# Patient Record
Sex: Female | Born: 1950
Health system: Southern US, Community
[De-identification: ages and names within clinical notes are randomized; demographics above are authoritative.]

## PROBLEM LIST (undated history)

## (undated) DIAGNOSIS — J189 Pneumonia, unspecified organism: Secondary | ICD-10-CM

## (undated) DIAGNOSIS — D649 Anemia, unspecified: Secondary | ICD-10-CM

## (undated) DIAGNOSIS — F17210 Nicotine dependence, cigarettes, uncomplicated: Secondary | ICD-10-CM

## (undated) DIAGNOSIS — R7303 Prediabetes: Secondary | ICD-10-CM

## (undated) DIAGNOSIS — E559 Vitamin D deficiency, unspecified: Secondary | ICD-10-CM

## (undated) DIAGNOSIS — F329 Major depressive disorder, single episode, unspecified: Secondary | ICD-10-CM

## (undated) DIAGNOSIS — Z923 Personal history of irradiation: Secondary | ICD-10-CM

## (undated) DIAGNOSIS — J984 Other disorders of lung: Secondary | ICD-10-CM

## (undated) DIAGNOSIS — C50919 Malignant neoplasm of unspecified site of unspecified female breast: Secondary | ICD-10-CM

## (undated) DIAGNOSIS — M797 Fibromyalgia: Secondary | ICD-10-CM

## (undated) DIAGNOSIS — F419 Anxiety disorder, unspecified: Secondary | ICD-10-CM

## (undated) DIAGNOSIS — I10 Essential (primary) hypertension: Secondary | ICD-10-CM

## (undated) DIAGNOSIS — K219 Gastro-esophageal reflux disease without esophagitis: Secondary | ICD-10-CM

## (undated) DIAGNOSIS — M7989 Other specified soft tissue disorders: Secondary | ICD-10-CM

## (undated) DIAGNOSIS — R0602 Shortness of breath: Secondary | ICD-10-CM

## (undated) DIAGNOSIS — F17211 Nicotine dependence, cigarettes, in remission: Secondary | ICD-10-CM

## (undated) DIAGNOSIS — N1831 Chronic kidney disease, stage 3a: Secondary | ICD-10-CM

## (undated) DIAGNOSIS — F319 Bipolar disorder, unspecified: Secondary | ICD-10-CM

## (undated) DIAGNOSIS — M549 Dorsalgia, unspecified: Secondary | ICD-10-CM

## (undated) DIAGNOSIS — J301 Allergic rhinitis due to pollen: Secondary | ICD-10-CM

## (undated) DIAGNOSIS — F1421 Cocaine dependence, in remission: Secondary | ICD-10-CM

## (undated) DIAGNOSIS — Z8619 Personal history of other infectious and parasitic diseases: Secondary | ICD-10-CM

## (undated) DIAGNOSIS — F32A Depression, unspecified: Secondary | ICD-10-CM

## (undated) DIAGNOSIS — J8410 Pulmonary fibrosis, unspecified: Secondary | ICD-10-CM

## (undated) DIAGNOSIS — J449 Chronic obstructive pulmonary disease, unspecified: Secondary | ICD-10-CM

## (undated) DIAGNOSIS — I129 Hypertensive chronic kidney disease with stage 1 through stage 4 chronic kidney disease, or unspecified chronic kidney disease: Secondary | ICD-10-CM

## (undated) DIAGNOSIS — F4310 Post-traumatic stress disorder, unspecified: Secondary | ICD-10-CM

## (undated) DIAGNOSIS — I700 Atherosclerosis of aorta: Secondary | ICD-10-CM

## (undated) DIAGNOSIS — N1832 Chronic kidney disease, stage 3b: Secondary | ICD-10-CM

## (undated) DIAGNOSIS — G4730 Sleep apnea, unspecified: Secondary | ICD-10-CM

## (undated) DIAGNOSIS — B009 Herpesviral infection, unspecified: Secondary | ICD-10-CM

## (undated) DIAGNOSIS — R42 Dizziness and giddiness: Secondary | ICD-10-CM

## (undated) DIAGNOSIS — M1990 Unspecified osteoarthritis, unspecified site: Secondary | ICD-10-CM

## (undated) HISTORY — DX: Vitamin D deficiency, unspecified: E55.9

## (undated) HISTORY — DX: Fibromyalgia: M79.7

## (undated) HISTORY — DX: Atherosclerosis of aorta: I70.0

## (undated) HISTORY — DX: Chronic kidney disease, stage 3a: N18.31

## (undated) HISTORY — DX: Hypertensive chronic kidney disease with stage 1 through stage 4 chronic kidney disease, or unspecified chronic kidney disease: I12.9

## (undated) HISTORY — DX: Shortness of breath: R06.02

## (undated) HISTORY — DX: Dorsalgia, unspecified: M54.9

## (undated) HISTORY — DX: Other specified soft tissue disorders: M79.89

## (undated) HISTORY — DX: Unspecified osteoarthritis, unspecified site: M19.90

## (undated) HISTORY — DX: Chronic kidney disease, stage 3b: N18.32

## (undated) HISTORY — DX: Herpesviral infection, unspecified: B00.9

## (undated) HISTORY — DX: Allergic rhinitis due to pollen: J30.1

## (undated) HISTORY — DX: Dizziness and giddiness: R42

## (undated) HISTORY — DX: Pulmonary fibrosis, unspecified: J84.10

## (undated) HISTORY — DX: Nicotine dependence, cigarettes, in remission: F17.211

## (undated) HISTORY — DX: Cocaine dependence, in remission: F14.21

## (undated) HISTORY — DX: Bipolar disorder, unspecified: F31.9

## (undated) HISTORY — DX: Nicotine dependence, cigarettes, uncomplicated: F17.210

## (undated) HISTORY — DX: Personal history of other infectious and parasitic diseases: Z86.19

## (undated) HISTORY — DX: Other disorders of lung: J98.4

---
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
## 1999-06-26 DIAGNOSIS — D649 Anemia, unspecified: Secondary | ICD-10-CM

## 1999-06-26 HISTORY — PX: GASTRIC BYPASS: SHX52

## 1999-06-26 HISTORY — DX: Anemia, unspecified: D64.9

## 2011-06-26 DIAGNOSIS — C50919 Malignant neoplasm of unspecified site of unspecified female breast: Secondary | ICD-10-CM

## 2011-06-26 DIAGNOSIS — Z923 Personal history of irradiation: Secondary | ICD-10-CM

## 2011-06-26 HISTORY — PX: BREAST LUMPECTOMY: SHX2

## 2011-06-26 HISTORY — DX: Malignant neoplasm of unspecified site of unspecified female breast: C50.919

## 2011-06-26 HISTORY — DX: Personal history of irradiation: Z92.3

## 2013-04-07 DIAGNOSIS — F329 Major depressive disorder, single episode, unspecified: Secondary | ICD-10-CM | POA: Insufficient documentation

## 2013-04-07 DIAGNOSIS — D0510 Intraductal carcinoma in situ of unspecified breast: Secondary | ICD-10-CM | POA: Insufficient documentation

## 2013-04-07 DIAGNOSIS — F32A Depression, unspecified: Secondary | ICD-10-CM | POA: Insufficient documentation

## 2013-10-24 DIAGNOSIS — N390 Urinary tract infection, site not specified: Secondary | ICD-10-CM | POA: Insufficient documentation

## 2013-10-24 DIAGNOSIS — Z720 Tobacco use: Secondary | ICD-10-CM | POA: Insufficient documentation

## 2013-10-24 DIAGNOSIS — T783XXA Angioneurotic edema, initial encounter: Secondary | ICD-10-CM | POA: Insufficient documentation

## 2015-04-20 DIAGNOSIS — R768 Other specified abnormal immunological findings in serum: Secondary | ICD-10-CM | POA: Insufficient documentation

## 2015-11-02 DIAGNOSIS — K219 Gastro-esophageal reflux disease without esophagitis: Secondary | ICD-10-CM | POA: Insufficient documentation

## 2015-11-28 ENCOUNTER — Encounter (HOSPITAL_COMMUNITY): Payer: Self-pay | Admitting: Emergency Medicine

## 2015-11-28 ENCOUNTER — Emergency Department (HOSPITAL_COMMUNITY)
Admission: EM | Admit: 2015-11-28 | Discharge: 2015-11-29 | Disposition: A | Discharge status: Home / Self Care | Payer: Medicare Other | Attending: Emergency Medicine | Admitting: Emergency Medicine

## 2015-11-28 DIAGNOSIS — F17200 Nicotine dependence, unspecified, uncomplicated: Secondary | ICD-10-CM | POA: Insufficient documentation

## 2015-11-28 DIAGNOSIS — F4310 Post-traumatic stress disorder, unspecified: Secondary | ICD-10-CM | POA: Insufficient documentation

## 2015-11-28 DIAGNOSIS — I10 Essential (primary) hypertension: Secondary | ICD-10-CM | POA: Insufficient documentation

## 2015-11-28 DIAGNOSIS — Z0283 Encounter for blood-alcohol and blood-drug test: Secondary | ICD-10-CM

## 2015-11-28 DIAGNOSIS — Z853 Personal history of malignant neoplasm of breast: Secondary | ICD-10-CM | POA: Diagnosis not present

## 2015-11-28 DIAGNOSIS — F329 Major depressive disorder, single episode, unspecified: Secondary | ICD-10-CM | POA: Insufficient documentation

## 2015-11-28 HISTORY — DX: Malignant neoplasm of unspecified site of unspecified female breast: C50.919

## 2015-11-28 HISTORY — DX: Post-traumatic stress disorder, unspecified: F43.10

## 2015-11-28 HISTORY — DX: Major depressive disorder, single episode, unspecified: F32.9

## 2015-11-28 HISTORY — DX: Depression, unspecified: F32.A

## 2015-11-28 HISTORY — DX: Essential (primary) hypertension: I10

## 2015-11-28 HISTORY — DX: Anxiety disorder, unspecified: F41.9

## 2015-11-28 LAB — RAPID URINE DRUG SCREEN, HOSP PERFORMED
Amphetamines: NOT DETECTED
Barbiturates: NOT DETECTED
Benzodiazepines: NOT DETECTED
COCAINE: NOT DETECTED
OPIATES: NOT DETECTED
Tetrahydrocannabinol: NOT DETECTED

## 2015-11-28 NOTE — ED Provider Notes (Signed)
CSN: [id number]     Arrival date & time 11/28/15  2254 History   First MD Initiated Contact with Patient 11/28/15 2336     Chief Complaint  Patient presents with  . Drug / Alcohol Assessment     (Consider location/radiation/quality/duration/timing/severity/associated sxs/prior Treatment) HPI Patient presents from a group facility with concern of possible drug exposure. Patient notes that she has not used opiates in 51 days. However, the patient had a positive drug screen, was evicted from her facility. She denies any recent use, or any physical complaints, any change from baseline medical condition. He requests additional evaluation.    Past Medical History  Diagnosis Date  . Hypertension   . Anxiety   . Depression   . PTSD (post-traumatic stress disorder)   . Breast cancer [facility])    Past Surgical History  Procedure Laterality Date  . Gastric bypass  2001   No family history on file. Social History  Substance Use Topics  . Smoking status: Current Every Day Smoker  . Smokeless tobacco: Not on file  . Alcohol Use: Yes   OB History    No data available     Review of Systems  Constitutional: Negative for fever.  Respiratory: Negative for shortness of breath.   Cardiovascular: Negative for chest pain.  Musculoskeletal:       Negative aside from HPI  Skin:       Negative aside from HPI  Allergic/Immunologic: Negative for immunocompromised state.  Neurological: Negative for weakness.  Psychiatric/Behavioral: Negative.       Allergies  Lisinopril  Home Medications   Prior to Admission medications   Not on File   BP 128/67 mmHg  Pulse 70  Temp(Src) 98.3 F (36.8 C) (Oral)  Resp 18  SpO2 97% Physical Exam  Constitutional: She is oriented to person, place, and time. She appears well-developed and well-nourished. No distress.  HENT:  Head: Normocephalic and atraumatic.  Eyes: Conjunctivae are normal. Right eye exhibits no discharge. Left eye exhibits no  discharge.  Pulmonary/Chest: No respiratory distress.  Musculoskeletal:  No appreciable abnormalities  Neurological: She is alert and oriented to person, place, and time. No cranial nerve deficit. She exhibits normal muscle tone. Coordination normal.  Skin: Skin is warm and dry.  Psychiatric: She has a normal mood and affect.  Nursing note and vitals reviewed.   ED Course  Procedures (including critical care time) Labs Review Labs Reviewed  URINE RAPID DRUG SCREEN, HOSP PERFORMED     Update: I discussed the patient's drug testing results with her residential facility staff.  MDM   Final diagnoses:  Blood drug testing for medicolegal reasons  Patient presents with concern of positive drug screen at a residential facility. Here repeat testing does not have positive values. Patient discharged in stable condition.  Carmin Muskrat, MD 11/29/15 0003

## 2015-11-28 NOTE — ED Notes (Signed)
Pt states that she tested false negative tonight on her drug test for the [facility] for morphine/opiates. She thinks it was a false negative because of the effexor she takes. Wants repeat drug test. Alert and oriented.

## 2015-11-29 NOTE — ED Notes (Signed)
Patient was alert, oriented and stable upon discharge. RN went over AVS and patient had no further questions.  

## 2015-11-29 NOTE — Discharge Instructions (Signed)
As discussed, your evaluation today has been largely reassuring.  But, it is important that you monitor your condition carefully, and do not hesitate to return to the ED if you develop new, or concerning changes in your condition. ? ?Otherwise, please follow-up with your physician for appropriate ongoing care. ? ?

## 2016-03-27 ENCOUNTER — Other Ambulatory Visit: Payer: Self-pay | Admitting: Family Medicine

## 2016-03-27 DIAGNOSIS — Z853 Personal history of malignant neoplasm of breast: Secondary | ICD-10-CM

## 2016-04-02 DIAGNOSIS — N950 Postmenopausal bleeding: Secondary | ICD-10-CM | POA: Insufficient documentation

## 2016-04-02 DIAGNOSIS — Z853 Personal history of malignant neoplasm of breast: Secondary | ICD-10-CM | POA: Insufficient documentation

## 2016-04-08 ENCOUNTER — Ambulatory Visit (HOSPITAL_COMMUNITY)
Admission: EM | Admit: 2016-04-08 | Discharge: 2016-04-08 | Disposition: A | Discharge status: Home / Self Care | Payer: Medicare Other | Attending: Internal Medicine | Admitting: Internal Medicine

## 2016-04-08 ENCOUNTER — Encounter (HOSPITAL_COMMUNITY): Payer: Self-pay | Admitting: *Deleted

## 2016-04-08 DIAGNOSIS — T3111 Burns involving 10-19% of body surface with 10-19% third degree burns: Secondary | ICD-10-CM

## 2016-04-08 MED ORDER — SILVER SULFADIAZINE 1 % EX CREA
TOPICAL_CREAM | CUTANEOUS | Status: AC
Start: 2016-04-08 — End: 2016-04-08
  Filled 2016-04-08: qty 85

## 2016-04-08 MED ORDER — SULFAMETHOXAZOLE-TRIMETHOPRIM 800-160 MG PO TABS: 1.0000 | ORAL_TABLET | Freq: Two times a day (BID) | ORAL | 0 refills | Status: DC

## 2016-04-08 MED ORDER — SILVER SULFADIAZINE 1 % EX CREA: 1.0000 "application " | TOPICAL_CREAM | Freq: Every day | CUTANEOUS | 0 refills | Status: DC

## 2016-04-08 MED ORDER — SILVER SULFADIAZINE 1 % EX CREA
TOPICAL_CREAM | Freq: Two times a day (BID) | CUTANEOUS | Status: DC
  Administered 2016-04-08: 13:00:00 via TOPICAL

## 2016-04-08 NOTE — ED Notes (Signed)
Wound  Care     With  silvedene   dsd  And  Kling/  Ace  Wrap    Applied  To  [facility]

## 2016-04-08 NOTE — Discharge Instructions (Signed)
Apply silverdene cream twice a day after wash and dry area. You have an open wound on RT lower abdomen area that needs to be wrapped loose after application. Do not use tape to cover. You do have a low grade fever monitor this and if tempeture rises you will need to return. The blisters on the RT thigh may burst or drain do not burst these on your own let them heal.
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
## 2016-04-08 NOTE — ED Notes (Signed)
Wound   Cleaned  With  Anti septic   Cream
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
## 2016-04-12 ENCOUNTER — Ambulatory Visit (HOSPITAL_COMMUNITY)
Admission: EM | Admit: 2016-04-12 | Discharge: 2016-04-12 | Disposition: A | Discharge status: Home / Self Care | Payer: Medicare Other | Attending: Family Medicine | Admitting: Family Medicine

## 2016-04-12 ENCOUNTER — Encounter (HOSPITAL_COMMUNITY): Payer: Self-pay | Admitting: Emergency Medicine

## 2016-04-12 DIAGNOSIS — Z760 Encounter for issue of repeat prescription: Secondary | ICD-10-CM

## 2016-04-12 DIAGNOSIS — Z5189 Encounter for other specified aftercare: Secondary | ICD-10-CM

## 2016-04-12 DIAGNOSIS — T2122XD Burn of second degree of abdominal wall, subsequent encounter: Secondary | ICD-10-CM

## 2016-04-12 MED ORDER — SILVER SULFADIAZINE 1 % EX CREA: 1.0000 "application " | TOPICAL_CREAM | Freq: Every day | CUTANEOUS | 0 refills | Status: DC

## 2016-04-12 MED ORDER — SILVER SULFADIAZINE 1 % EX CREA
TOPICAL_CREAM | Freq: Once | CUTANEOUS | Status: AC
  Administered 2016-04-12: 16:00:00 via TOPICAL
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
## 2016-04-12 NOTE — Discharge Instructions (Signed)
Clean the wound once a day in the shower. Pat dry. Apply the Silvadene cream for the next 4-5 days. By this time the wound should be drying and scabbing over. Watch for any signs of infection, increased redness, purulent or white drainage, redness around the wound, bad odor or other signs of infection sig medical attention promptly.
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
## 2016-05-10 DIAGNOSIS — Z90710 Acquired absence of both cervix and uterus: Secondary | ICD-10-CM | POA: Insufficient documentation

## 2016-05-30 DIAGNOSIS — M545 Low back pain: Secondary | ICD-10-CM | POA: Diagnosis not present

## 2016-05-30 DIAGNOSIS — L929 Granulomatous disorder of the skin and subcutaneous tissue, unspecified: Secondary | ICD-10-CM | POA: Diagnosis not present

## 2016-05-30 DIAGNOSIS — R300 Dysuria: Secondary | ICD-10-CM | POA: Diagnosis not present

## 2016-06-14 ENCOUNTER — Ambulatory Visit (INDEPENDENT_AMBULATORY_CARE_PROVIDER_SITE_OTHER): Payer: Commercial Managed Care - HMO

## 2016-06-14 ENCOUNTER — Encounter (INDEPENDENT_AMBULATORY_CARE_PROVIDER_SITE_OTHER): Payer: Self-pay | Admitting: Orthopaedic Surgery

## 2016-06-14 ENCOUNTER — Ambulatory Visit (INDEPENDENT_AMBULATORY_CARE_PROVIDER_SITE_OTHER): Payer: Commercial Managed Care - HMO | Admitting: Orthopaedic Surgery

## 2016-06-14 DIAGNOSIS — M1711 Unilateral primary osteoarthritis, right knee: Secondary | ICD-10-CM

## 2016-06-14 DIAGNOSIS — M1712 Unilateral primary osteoarthritis, left knee: Secondary | ICD-10-CM | POA: Insufficient documentation

## 2016-06-14 NOTE — Progress Notes (Signed)
Office Visit Note   Patient: Rachel Woodard           Date of Birth: Feb 10, 1951           MRN: [id number] Visit Date: 06/14/2016              Requested by: Chester Holstein, MD [address] [place], [place] [id number] PCP: Chester Holstein, MD   Assessment & Plan: Visit Diagnoses:  1. Primary osteoarthritis of right knee   2. Unilateral primary osteoarthritis, left knee     Plan: discussed treatment options including surgical vs nonsurgical options.  Patient is ready for TKA to improve quality of life.  I gave her info on TKA and discussed surgery details including r/b/a.  Denies h/o DVT.  We will schedule her for the near future.  Follow-Up Instructions: Return for 2 week postop visit.   Orders:  Orders Placed This Encounter  Procedures  . XR KNEE 3 VIEW RIGHT  . XR KNEE 3 VIEW LEFT   No orders of the defined types were placed in this encounter.     Procedures: No procedures performed   Clinical Data: No additional findings.   Subjective: Chief Complaint  Patient presents with  . Right Knee - Pain  . Left Knee - Pain  . Right Wrist - New Patient (Initial Visit)    65 yo female comes in today with bilateral knee pain worse on the right for many years.  Pain is 7/10 and endorses giving away.  Pain has severely affected quality of life and activity level.  Has had injections in the past but patient wants a more permanent treatment.  She does have fibromyalgia.  Pain is worse with standing and walking.      Review of Systems  Constitutional: Negative.   HENT: Negative.   Eyes: Negative.   Respiratory: Negative.   Cardiovascular: Negative.   Endocrine: Negative.   Musculoskeletal: Negative.   Neurological: Negative.   Hematological: Negative.   Psychiatric/Behavioral: Negative.   All other systems reviewed and are negative.    Objective: Vital Signs: There were no vitals taken for this visit.  Physical Exam  Constitutional: She  is oriented to person, place, and time. She appears well-developed and well-nourished.  Pulmonary/Chest: Effort normal.  Musculoskeletal:       Right knee: She exhibits no effusion.       Left knee: She exhibits no effusion.  Neurological: She is alert and oriented to person, place, and time.  Skin: Skin is warm. Capillary refill takes less than 2 seconds.  Psychiatric: She has a normal mood and affect. Her behavior is normal. Judgment and thought content normal.  Nursing note and vitals reviewed.   Right Knee Exam   Range of Motion  Extension: normal  Flexion: normal   Tests  [place]ray:  Medial - negative Lateral - negative  Other  Sensation: normal Pulse: present Swelling: none Other tests: no effusion present   Left Knee Exam   Range of Motion  Extension: normal  Flexion: normal   Tests  [place]ray:  Medial - negative   Other  Sensation: normal Pulse: present Swelling: none Effusion: no effusion present      Specialty Comments:  No specialty comments available.  Imaging: Xr Knee 3 View Left  Result Date: 06/14/2016 [facility]  Xr Knee 3 View Right  Result Date: 06/14/2016 [facility]    PMFS History: Patient Active Problem List   Diagnosis Date  Noted  . Unilateral primary osteoarthritis, left knee 06/14/2016   Past Medical History:  Diagnosis Date  . Anxiety   . Breast cancer ([place])   . Depression   . Hypertension   . PTSD (post-traumatic stress disorder)     No family history on file.  Past Surgical History:  Procedure Laterality Date  . GASTRIC BYPASS  2001   Social History   Occupational History  . Not on file.   Social History Main Topics  . Smoking status: Current Every Day Smoker  . Smokeless tobacco: Never Used  . Alcohol use Yes  . Drug use: No     Comment: clean x 50 days on 11/28/15  . Sexual activity: Not on file

## 2016-06-25 HISTORY — PX: ABDOMINAL HYSTERECTOMY: SHX81

## 2016-07-03 ENCOUNTER — Telehealth (INDEPENDENT_AMBULATORY_CARE_PROVIDER_SITE_OTHER): Payer: Self-pay | Admitting: *Deleted

## 2016-07-03 NOTE — Telephone Encounter (Signed)
[facility] [employer] calling for last office note on pt FAX: [phone]

## 2016-07-04 NOTE — Telephone Encounter (Signed)
Faxed note.

## 2016-07-10 ENCOUNTER — Ambulatory Visit: Payer: Medicare Other | Admitting: Family Medicine

## 2016-07-26 ENCOUNTER — Inpatient Hospital Stay (INDEPENDENT_AMBULATORY_CARE_PROVIDER_SITE_OTHER): Payer: Medicare Other | Admitting: Orthopaedic Surgery

## 2016-07-31 DIAGNOSIS — M76822 Posterior tibial tendinitis, left leg: Secondary | ICD-10-CM | POA: Diagnosis not present

## 2016-07-31 DIAGNOSIS — L602 Onychogryphosis: Secondary | ICD-10-CM | POA: Diagnosis not present

## 2016-07-31 DIAGNOSIS — B351 Tinea unguium: Secondary | ICD-10-CM | POA: Diagnosis not present

## 2016-07-31 DIAGNOSIS — M79671 Pain in right foot: Secondary | ICD-10-CM | POA: Diagnosis not present

## 2016-07-31 DIAGNOSIS — M79672 Pain in left foot: Secondary | ICD-10-CM | POA: Diagnosis not present

## 2016-07-31 DIAGNOSIS — B353 Tinea pedis: Secondary | ICD-10-CM | POA: Diagnosis not present

## 2016-08-08 ENCOUNTER — Ambulatory Visit: Payer: Medicare HMO | Attending: Family Medicine | Admitting: Internal Medicine

## 2016-08-08 VITALS — BP 131/84 | HR 80 | Temp 98.5°F | Resp 16 | Wt 240.6 lb

## 2016-08-08 DIAGNOSIS — Z13220 Encounter for screening for lipoid disorders: Secondary | ICD-10-CM | POA: Diagnosis not present

## 2016-08-08 DIAGNOSIS — Z1321 Encounter for screening for nutritional disorder: Secondary | ICD-10-CM

## 2016-08-08 DIAGNOSIS — Z888 Allergy status to other drugs, medicaments and biological substances status: Secondary | ICD-10-CM | POA: Diagnosis not present

## 2016-08-08 DIAGNOSIS — N951 Menopausal and female climacteric states: Secondary | ICD-10-CM

## 2016-08-08 DIAGNOSIS — Z79899 Other long term (current) drug therapy: Secondary | ICD-10-CM | POA: Diagnosis not present

## 2016-08-08 DIAGNOSIS — R0683 Snoring: Secondary | ICD-10-CM

## 2016-08-08 DIAGNOSIS — F3342 Major depressive disorder, recurrent, in full remission: Secondary | ICD-10-CM | POA: Diagnosis not present

## 2016-08-08 DIAGNOSIS — Z853 Personal history of malignant neoplasm of breast: Secondary | ICD-10-CM | POA: Insufficient documentation

## 2016-08-08 DIAGNOSIS — R7303 Prediabetes: Secondary | ICD-10-CM | POA: Insufficient documentation

## 2016-08-08 DIAGNOSIS — F17210 Nicotine dependence, cigarettes, uncomplicated: Secondary | ICD-10-CM | POA: Diagnosis not present

## 2016-08-08 DIAGNOSIS — M797 Fibromyalgia: Secondary | ICD-10-CM | POA: Diagnosis not present

## 2016-08-08 DIAGNOSIS — Z1329 Encounter for screening for other suspected endocrine disorder: Secondary | ICD-10-CM

## 2016-08-08 DIAGNOSIS — K219 Gastro-esophageal reflux disease without esophagitis: Secondary | ICD-10-CM | POA: Diagnosis not present

## 2016-08-08 DIAGNOSIS — F419 Anxiety disorder, unspecified: Secondary | ICD-10-CM

## 2016-08-08 DIAGNOSIS — Z90722 Acquired absence of ovaries, bilateral: Secondary | ICD-10-CM | POA: Insufficient documentation

## 2016-08-08 DIAGNOSIS — B351 Tinea unguium: Secondary | ICD-10-CM | POA: Diagnosis not present

## 2016-08-08 DIAGNOSIS — E559 Vitamin D deficiency, unspecified: Secondary | ICD-10-CM | POA: Diagnosis not present

## 2016-08-08 DIAGNOSIS — Z90710 Acquired absence of both cervix and uterus: Secondary | ICD-10-CM | POA: Insufficient documentation

## 2016-08-08 DIAGNOSIS — I10 Essential (primary) hypertension: Secondary | ICD-10-CM | POA: Diagnosis not present

## 2016-08-08 DIAGNOSIS — N898 Other specified noninflammatory disorders of vagina: Secondary | ICD-10-CM | POA: Insufficient documentation

## 2016-08-08 DIAGNOSIS — Z131 Encounter for screening for diabetes mellitus: Secondary | ICD-10-CM

## 2016-08-08 DIAGNOSIS — M170 Bilateral primary osteoarthritis of knee: Secondary | ICD-10-CM | POA: Diagnosis not present

## 2016-08-08 DIAGNOSIS — Z23 Encounter for immunization: Secondary | ICD-10-CM | POA: Diagnosis not present

## 2016-08-08 DIAGNOSIS — F4310 Post-traumatic stress disorder, unspecified: Secondary | ICD-10-CM | POA: Insufficient documentation

## 2016-08-08 LAB — LIPID PANEL
Cholesterol: 155 mg/dL (ref <200)
HDL: 51 mg/dL (ref >50)
LDL CALC: 87 mg/dL (ref <100)
Total CHOL/HDL Ratio: 3 Ratio (ref <5.0)
Triglycerides: 85 mg/dL (ref <150)
VLDL: 17 mg/dL (ref <30)

## 2016-08-08 LAB — CMP AND LIVER
ALT: 21 U/L (ref 6–29)
AST: 16 U/L (ref 10–35)
Albumin: 4 g/dL (ref 3.6–5.1)
Alkaline Phosphatase: 101 U/L (ref 33–130)
BILIRUBIN INDIRECT: 0.3 mg/dL (ref 0.2–1.2)
BUN: 14 mg/dL (ref 7–25)
Bilirubin, Direct: 0.1 mg/dL (ref <=0.2)
CHLORIDE: 107 mmol/L (ref 98–110)
CO2: 29 mmol/L (ref 20–31)
Calcium: 9.2 mg/dL (ref 8.6–10.4)
Creat: 0.95 mg/dL (ref 0.50–0.99)
GLUCOSE: 95 mg/dL (ref 65–99)
POTASSIUM: 4.3 mmol/L (ref 3.5–5.3)
SODIUM: 143 mmol/L (ref 135–146)
Total Bilirubin: 0.4 mg/dL (ref 0.2–1.2)
Total Protein: 7 g/dL (ref 6.1–8.1)

## 2016-08-08 LAB — TSH: TSH: 0.97 mIU/L

## 2016-08-08 LAB — POCT GLYCOSYLATED HEMOGLOBIN (HGB A1C): Hemoglobin A1C: 5.7

## 2016-08-08 MED ORDER — METFORMIN HCL 500 MG PO TABS: 500.0000 mg | ORAL_TABLET | Freq: Every day | ORAL | 3 refills | Status: DC

## 2016-08-08 MED ORDER — VENLAFAXINE HCL 75 MG PO TABS: 75.0000 mg | ORAL_TABLET | Freq: Every day | ORAL | 1 refills | Status: DC

## 2016-08-08 MED ORDER — RANITIDINE HCL 150 MG PO TABS: 150.0000 mg | ORAL_TABLET | Freq: Two times a day (BID) | ORAL | 3 refills | Status: DC

## 2016-08-08 MED ORDER — CARVEDILOL 3.125 MG PO TABS: 3.1250 mg | ORAL_TABLET | Freq: Two times a day (BID) | ORAL | 3 refills | Status: DC

## 2016-08-08 MED ORDER — AMLODIPINE BESYLATE 10 MG PO TABS: 10.0000 mg | ORAL_TABLET | Freq: Every day | ORAL | 3 refills | Status: DC

## 2016-08-08 MED ORDER — HYDROCHLOROTHIAZIDE 12.5 MG PO TABS: 12.5000 mg | ORAL_TABLET | Freq: Every day | ORAL | 3 refills | Status: DC

## 2016-08-08 MED ORDER — ESTRADIOL 0.1 MG/GM VA CREA: 1.0000 | TOPICAL_CREAM | Freq: Every day | VAGINAL | 12 refills | Status: DC

## 2016-08-08 NOTE — Patient Instructions (Addendum)
Calcium 1200mg / daily (over the counter)   Pneumococcal Polysaccharide Vaccine: What You Need to Know 1. Why get vaccinated? Vaccination can protect older adults (and some children and younger adults) from pneumococcal disease. Pneumococcal disease is caused by bacteria that can spread from person to person through close contact. It can cause ear infections, and it can also lead to more serious infections of the:  Lungs (pneumonia),  Blood (bacteremia), and  Covering of the brain and spinal cord (meningitis). Meningitis can cause deafness and brain damage, and it can be fatal. Anyone can get pneumococcal disease, but children under 36 years of age, people with certain medical conditions, adults over 90 years of age, and cigarette smokers are at the highest risk. About 18,000 older adults die each year from pneumococcal disease in the [place]. Treatment of pneumococcal infections with penicillin and other drugs used to be more effective. But some strains of the disease have become resistant to these drugs. This makes prevention of the disease, through vaccination, even more important. 2. Pneumococcal polysaccharide vaccine (PPSV23) Pneumococcal polysaccharide vaccine (PPSV23) protects against 23 types of pneumococcal bacteria. It will not prevent all pneumococcal disease. PPSV23 is recommended for:  All adults 37 years of age and older,  Anyone 2 through 64 years of age with certain long-term health problems,  Anyone 2 through 64 years of age with a weakened immune system,  Adults 3 through 64 years of age who smoke cigarettes or have asthma. Most people need only one dose of PPSV. A second dose is recommended for certain high-risk groups. People 27 and older should get a dose even if they have gotten one or more doses of the vaccine before they turned 65. Your healthcare provider can give you more information about these recommendations. Most healthy adults develop protection  within 2 to 3 weeks of getting the shot. 3. Some people should not get this vaccine  Anyone who has had a life-threatening allergic reaction to PPSV should not get another dose.  Anyone who has a severe allergy to any component of PPSV should not receive it. Tell your provider if you have any severe allergies.  Anyone who is moderately or severely ill when the shot is scheduled may be asked to wait until they recover before getting the vaccine. Someone with a mild illness can usually be vaccinated.  Children less than 93 years of age should not receive this vaccine.  There is no evidence that PPSV is harmful to either a pregnant woman or to her fetus. However, as a precaution, women who need the vaccine should be vaccinated before becoming pregnant, if possible. 4. Risks of a vaccine reaction With any medicine, including vaccines, there is a chance of side effects. These are usually mild and go away on their own, but serious reactions are also possible. About half of people who get PPSV have mild side effects, such as redness or pain where the shot is given, which go away within about two days. Less than 1 out of 100 people develop a fever, muscle aches, or more severe local reactions. Problems that could happen after any vaccine:  People sometimes faint after a medical procedure, including vaccination. Sitting or lying down for about 15 minutes can help prevent fainting, and injuries caused by a fall. Tell your doctor if you feel dizzy, or have vision changes or ringing in the ears.  Some people get severe pain in the shoulder and have difficulty moving the arm where a shot was given.  This happens very rarely.  Any medication can cause a severe allergic reaction. Such reactions from a vaccine are very rare, estimated at about 1 in a million doses, and would happen within a few minutes to a few hours after the vaccination. As with any medicine, there is a very remote chance of a vaccine causing  a serious injury or death. The safety of vaccines is always being monitored. For more information, visit: http://www.aguilar.org/ 5. What if there is a serious reaction? What should I look for? Look for anything that concerns you, such as signs of a severe allergic reaction, very high fever, or unusual behavior. Signs of a severe allergic reaction can include hives, swelling of the face and throat, difficulty breathing, a fast heartbeat, dizziness, and weakness. These would usually start a few minutes to a few hours after the vaccination. What should I do? If you think it is a severe allergic reaction or other emergency that can't wait, call 9-1-1 or get to the nearest hospital. Otherwise, call your doctor. Afterward, the reaction should be reported to the Vaccine Adverse Event Reporting System (VAERS). Your doctor might file this report, or you can do it yourself through the VAERS web site at www.vaers.SamedayNews.es, or by calling 5131477409. VAERS does not give medical advice. 6. How can I learn more?  Ask your doctor. He or she can give you the vaccine package insert or suggest other sources of information.  Call your local or state health department.  Contact the Centers for Disease Control and Prevention (CDC):  Call 2192988127 (1-800-CDC-INFO) or  Visit CDC's website at http://hunter.com/ CDC Pneumococcal Polysaccharide Vaccine VIS (10/16/13) This information is not intended to replace advice given to you by your health care provider. Make sure you discuss any questions you have with your health care provider. Document Released: 04/08/2006 Document Revised: 03/01/2016 Document Reviewed: 03/01/2016 Elsevier Interactive Patient Education  2017 [place]. Influenza Virus Vaccine injection (Fluarix) What is this medicine? INFLUENZA VIRUS VACCINE (in floo EN zuh VAHY ruhs vak SEEN) helps to reduce the risk of getting influenza also known as the flu. This medicine may be used  for other purposes; ask your health care provider or pharmacist if you have questions. COMMON BRAND NAME(S): Fluarix, Fluzone What should I tell my health care provider before I take this medicine? They need to know if you have any of these conditions: -bleeding disorder like hemophilia -fever or infection -Guillain-Barre syndrome or other neurological problems -immune system problems -infection with the human immunodeficiency virus (HIV) or AIDS -low blood platelet counts -multiple sclerosis -an unusual or allergic reaction to influenza virus vaccine, eggs, chicken proteins, latex, gentamicin, other medicines, foods, dyes or preservatives -pregnant or trying to get pregnant -breast-feeding How should I use this medicine? This vaccine is for injection into a muscle. It is given by a health care professional. A copy of Vaccine Information Statements will be given before each vaccination. Read this sheet carefully each time. The sheet may change frequently. Talk to your pediatrician regarding the use of this medicine in children. Special care may be needed. Overdosage: If you think you have taken too much of this medicine contact a poison control center or emergency room at once. NOTE: This medicine is only for you. Do not share this medicine with others. What if I miss a dose? This does not apply. What may interact with this medicine? -chemotherapy or radiation therapy -medicines that lower your immune system like etanercept, anakinra, infliximab, and adalimumab -medicines that treat  or prevent blood clots like warfarin -phenytoin -steroid medicines like prednisone or cortisone -theophylline -vaccines This list may not describe all possible interactions. Give your health care provider a list of all the medicines, herbs, non-prescription drugs, or dietary supplements you use. Also tell them if you smoke, drink alcohol, or use illegal drugs. Some items may interact with your  medicine. What should I watch for while using this medicine? Report any side effects that do not go away within 3 days to your doctor or health care professional. Call your health care provider if any unusual symptoms occur within 6 weeks of receiving this vaccine. You may still catch the flu, but the illness is not usually as bad. You cannot get the flu from the vaccine. The vaccine will not protect against colds or other illnesses that may cause fever. The vaccine is needed every year. What side effects may I notice from receiving this medicine? Side effects that you should report to your doctor or health care professional as soon as possible: -allergic reactions like skin rash, itching or hives, swelling of the face, lips, or tongue Side effects that usually do not require medical attention (report to your doctor or health care professional if they continue or are bothersome): -fever -headache -muscle aches and pains -pain, tenderness, redness, or swelling at site where injected -weak or tired This list may not describe all possible side effects. Call your doctor for medical advice about side effects. You may report side effects to FDA at 1-800-FDA-1088. Where should I keep my medicine? This vaccine is only given in a clinic, pharmacy, doctor's office, or other health care setting and will not be stored at home. NOTE: This sheet is a summary. It may not cover all possible information. If you have questions about this medicine, talk to your doctor, pharmacist, or health care provider.  2017 Elsevier/Gold Standard (2008-01-07 09:30:40) Td Vaccine (Tetanus and Diphtheria): What You Need to Know 1. Why get vaccinated? Tetanus  and diphtheria are very serious diseases. They are rare in the [place] today, but people who do become infected often have severe complications. Td vaccine is used to protect adolescents and adults from both of these diseases. Both tetanus and diphtheria are  infections caused by bacteria. Diphtheria spreads from person to person through coughing or sneezing. Tetanus-causing bacteria enter the body through cuts, scratches, or wounds. TETANUS (lockjaw) causes painful muscle tightening and stiffness, usually all over the body.  It can lead to tightening of muscles in the head and neck so you can't open your mouth, swallow, or sometimes even breathe. Tetanus kills about 1 out of every 10 people who are infected even after receiving the best medical care. DIPHTHERIA can cause a thick coating to form in the back of the throat.  It can lead to breathing problems, paralysis, heart failure, and death. Before vaccines, as many as 200,000 cases of diphtheria and hundreds of cases of tetanus were reported in the [place] each year. Since vaccination began, reports of cases for both diseases have dropped by about 99%. 2. Td vaccine Td vaccine can protect adolescents and adults from tetanus and diphtheria. Td is usually given as a booster dose every 10 years but it can also be given earlier after a severe and dirty wound or burn. Another vaccine, called Tdap, which protects against pertussis in addition to tetanus and diphtheria, is sometimes recommended instead of Td vaccine. Your doctor or the person giving you the vaccine can give you more  information. Td may safely be given at the same time as other vaccines. 3. Some people should not get this vaccine  A person who has ever had a life-threatening allergic reaction after a previous dose of any tetanus or diphtheria containing vaccine, OR has a severe allergy to any part of this vaccine, should not get Td vaccine. Tell the person giving the vaccine about any severe allergies.  Talk to your doctor if you:  had severe pain or swelling after any vaccine containing diphtheria or tetanus,  ever had a condition called Guillain Barre Syndrome (GBS),  aren't feeling well on the day the shot is scheduled. 4.  What are the risks from Td vaccine? With any medicine, including vaccines, there is a chance of side effects. These are usually mild and go away on their own. Serious reactions are also possible but are rare. Most people who get Td vaccine do not have any problems with it. Mild problems following Td vaccine: (Did not interfere with activities)  Pain where the shot was given (about 8 people in 10)  Redness or swelling where the shot was given (about 1 person in 4)  Mild fever (rare)  Headache (about 1 person in 4)  Tiredness (about 1 person in 4) Moderate problems following Td vaccine: (Interfered with activities, but did not require medical attention)  Fever over 102F (rare) Severe problems following Td vaccine: (Unable to perform usual activities; required medical attention)  Swelling, severe pain, bleeding and/or redness in the arm where the shot was given (rare). Problems that could happen after any vaccine:  People sometimes faint after a medical procedure, including vaccination. Sitting or lying down for about 15 minutes can help prevent fainting, and injuries caused by a fall. Tell your doctor if you feel dizzy, or have vision changes or ringing in the ears.  Some people get severe pain in the shoulder and have difficulty moving the arm where a shot was given. This happens very rarely.  Any medication can cause a severe allergic reaction. Such reactions from a vaccine are very rare, estimated at fewer than 1 in a million doses, and would happen within a few minutes to a few hours after the vaccination. As with any medicine, there is a very remote chance of a vaccine causing a serious injury or death. The safety of vaccines is always being monitored. For more information, visit: http://www.aguilar.org/ 5. What if there is a serious reaction? What should I look for? Look for anything that concerns you, such as signs of a severe allergic reaction, very high fever, or unusual  behavior. Signs of a severe allergic reaction can include hives, swelling of the face and throat, difficulty breathing, a fast heartbeat, dizziness, and weakness. These would usually start a few minutes to a few hours after the vaccination. What should I do?  If you think it is a severe allergic reaction or other emergency that can't wait, call 9-1-1 or get the person to the nearest hospital. Otherwise, call your doctor.  Afterward, the reaction should be reported to the Vaccine Adverse Event Reporting System (VAERS). Your doctor might file this report, or you can do it yourself through the VAERS web site at www.vaers.SamedayNews.es, or by calling (424)807-5978.  VAERS does not give medical advice. 6. The National Vaccine Injury Compensation Program The [employer] Vaccine Injury Compensation Program (VICP) is a federal program that was created to compensate people who may have been injured by certain vaccines. Persons who believe they may have been  injured by a vaccine can learn about the program and about filing a claim by calling 3658442677 or visiting the [employer] website at GoldCloset.com.ee. There is a time limit to file a claim for compensation. 7. How can I learn more?  Ask your doctor. He or she can give you the vaccine package insert or suggest other sources of information.  Call your local or state health department.  Contact the Centers for Disease Control and Prevention (CDC):  Call 5701560892 (1-800-CDC-INFO)  Visit CDC's website at http://hunter.com/ CDC Td Vaccine VIS (10/04/15) This information is not intended to replace advice given to you by your health care provider. Make sure you discuss any questions you have with your health care provider. Document Released: 04/08/2006 Document Revised: 03/01/2016 Document Reviewed: 03/01/2016 Elsevier Interactive Patient Education  2017 Elsevier Inc.  -  Low-Sodium Eating Plan Sodium raises blood pressure and causes  water to be held in the body. Getting less sodium from food will help lower your blood pressure, reduce any swelling, and protect your heart, liver, and kidneys. We get sodium by adding salt (sodium chloride) to food. Most of our sodium comes from canned, boxed, and frozen foods. Restaurant foods, fast foods, and pizza are also very high in sodium. Even if you take medicine to lower your blood pressure or to reduce fluid in your body, getting less sodium from your food is important. What is my plan? Most people should limit their sodium intake to 2,300 mg a day. Your health care provider recommends that you limit your sodium intake to 000mg  a day. What do I need to know about this eating plan? For the low-sodium eating plan, you will follow these general guidelines:  Choose foods with a % Daily Value for sodium of less than 5% (as listed on the food label).  Use salt-free seasonings or herbs instead of table salt or sea salt.  Check with your health care provider or pharmacist before using salt substitutes.  Eat fresh foods.  Eat more vegetables and fruits.  Limit canned vegetables. If you do use them, rinse them well to decrease the sodium.  Limit cheese to 1 oz (28 g) per day.  Eat lower-sodium products, often labeled as "lower sodium" or "no salt added."  Avoid foods that contain monosodium glutamate (MSG). MSG is sometimes added to [place] food and some canned foods.  Check food labels (Nutrition Facts labels) on foods to learn how much sodium is in one serving.  Eat more home-cooked food and less restaurant, buffet, and fast food.  When eating at a restaurant, ask that your food be prepared with less salt, or no salt if possible. How do I read food labels for sodium information? The Nutrition Facts label lists the amount of sodium in one serving of the food. If you eat more than one serving, you must multiply the listed amount of sodium by the number of servings. Food labels  may also identify foods as:  Sodium free-Less than 5 mg in a serving.  Very low sodium-35 mg or less in a serving.  Low sodium-140 mg or less in a serving.  Light in sodium-50% less sodium in a serving. For example, if a food that usually has 300 mg of sodium is changed to become light in sodium, it will have 150 mg of sodium.  Reduced sodium-25% less sodium in a serving. For example, if a food that usually has 400 mg of sodium is changed to reduced sodium, it will have 300 mg  of sodium. What foods can I eat? Grains  Low-sodium cereals, including oats, puffed wheat and rice, and shredded wheat cereals. Low-sodium crackers. Unsalted rice and pasta. Lower-sodium bread. Vegetables  Frozen or fresh vegetables. Low-sodium or reduced-sodium canned vegetables. Low-sodium or reduced-sodium tomato sauce and paste. Low-sodium or reduced-sodium tomato and vegetable juices. Fruits  Fresh, frozen, and canned fruit. Fruit juice. Meat and Other Protein Products  Low-sodium canned tuna and salmon. Fresh or frozen meat, poultry, seafood, and fish. Lamb. Unsalted nuts. Dried beans, peas, and lentils without added salt. Unsalted canned beans. Homemade soups without salt. Eggs. Dairy  Milk. Soy milk. Ricotta cheese. Low-sodium or reduced-sodium cheeses. Yogurt. Condiments  Fresh and dried herbs and spices. Salt-free seasonings. Onion and garlic powders. Low-sodium varieties of mustard and ketchup. Fresh or refrigerated horseradish. Lemon juice. Fats and Oils  Reduced-sodium salad dressings. Unsalted butter. Other  Unsalted popcorn and pretzels. The items listed above may not be a complete list of recommended foods or beverages. Contact your dietitian for more options.  What foods are not recommended? Grains  Instant hot cereals. Bread stuffing, pancake, and biscuit mixes. Croutons. Seasoned rice or pasta mixes. Noodle soup cups. Boxed or frozen macaroni and cheese. Self-rising flour. Regular salted  crackers. Vegetables  Regular canned vegetables. Regular canned tomato sauce and paste. Regular tomato and vegetable juices. Frozen vegetables in sauces. Salted [place] fries. Olives. Angie Fava. Relishes. Sauerkraut. Salsa. Meat and Other Protein Products  Salted, canned, smoked, spiced, or pickled meats, seafood, or fish. Bacon, ham, sausage, hot dogs, corned beef, chipped beef, and packaged luncheon meats. Salt pork. Jerky. Pickled herring. Anchovies, regular canned tuna, and sardines. Salted nuts. Dairy  Processed cheese and cheese spreads. Cheese curds. Blue cheese and cottage cheese. Buttermilk. Condiments  Onion and garlic salt, seasoned salt, table salt, and sea salt. Canned and packaged gravies. Worcestershire sauce. Tartar sauce. Barbecue sauce. Teriyaki sauce. Soy sauce, including reduced sodium. Steak sauce. Fish sauce. Oyster sauce. Cocktail sauce. Horseradish that you find on the shelf. Regular ketchup and mustard. Meat flavorings and tenderizers. Bouillon cubes. Hot sauce. Tabasco sauce. Marinades. Taco seasonings. Relishes. Fats and Oils  Regular salad dressings. Salted butter. Margarine. Ghee. Bacon fat. Other  Potato and tortilla chips. Corn chips and puffs. Salted popcorn and pretzels. Canned or dried soups. Pizza. Frozen entrees and pot pies. The items listed above may not be a complete list of foods and beverages to avoid. Contact your dietitian for more information.  This information is not intended to replace advice given to you by your health care provider. Make sure you discuss any questions you have with your health care provider. Document Released: 12/01/2001 Document Revised: 11/17/2015 Document Reviewed: 04/15/2013 Elsevier Interactive Patient Education  2017 [place].  //  Prediabetes Prediabetes is the condition of having a blood sugar (blood glucose) level that is higher than it should be, but not high enough for you to be diagnosed with type 2 diabetes. Having  prediabetes puts you at risk for developing type 2 diabetes (type 2 diabetes mellitus). Prediabetes may be called impaired glucose tolerance or impaired fasting glucose. Prediabetes usually does not cause symptoms. Your health care provider can diagnose this condition with blood tests. You may be tested for prediabetes if you are overweight and if you have at least one other risk factor for prediabetes. Risk factors for prediabetes include:  Having a family member with type 2 diabetes.  Being overweight or obese.  Being older than age 87.  Being of American-Indian, African-American, Hispanic/Latino,  or Asian/Pacific Islander descent.  Having an inactive (sedentary) lifestyle.  Having a history of gestational diabetes or polycystic ovarian syndrome (PCOS).  Having low levels of good cholesterol (HDL-C) or high levels of blood fats (triglycerides).  Having high blood pressure. What is blood glucose and how is blood glucose measured?   Blood glucose refers to the amount of glucose in your bloodstream. Glucose comes from eating foods that contain sugars and starches (carbohydrates) that the body breaks down into glucose. Your blood glucose level may be measured in mg/dL (milligrams per deciliter) or mmol/L (millimoles per liter).Your blood glucose may be checked with one or more of the following blood tests:  A fasting blood glucose (FBG) test. You will not be allowed to eat (you will fast) for at least 8 hours before a blood sample is taken.  A normal range for FBG is 70-100 mg/dl (3.9-5.6 mmol/L).  An A1c (hemoglobin A1c) blood test. This test provides information about blood glucose control over the previous 2?65months.  An oral glucose tolerance test (OGTT). This test measures your blood glucose twice:  After fasting. This is your baseline level.  Two hours after you drink a beverage that contains glucose. You may be diagnosed with prediabetes:  If your FBG is 100?125 mg/dL  (5.6-6.9 mmol/L).  If your A1c level is 5.7?6.4%.  If your OGGT result is 140?199 mg/dL (7.8-11 mmol/L). These blood tests may be repeated to confirm your diagnosis. What happens if blood glucose is too high? The pancreas produces a hormone (insulin) that helps move glucose from the bloodstream into cells. When cells in the body do not respond properly to insulin that the body makes (insulin resistance), excess glucose builds up in the blood instead of going into cells. As a result, high blood glucose (hyperglycemia) can develop, which can cause many complications. This is a symptom of prediabetes. What can happen if blood glucose stays higher than normal for a long time? Having high blood glucose for a long time is dangerous. Too much glucose in your blood can damage your nerves and blood vessels. Long-term damage can lead to complications from diabetes, which may include:  Heart disease.  Stroke.  Blindness.  Kidney disease.  Depression.  Poor circulation in the feet and legs, which could lead to surgical removal (amputation) in severe cases. How can prediabetes be prevented from turning into type 2 diabetes?   To help prevent type 2 diabetes, take the following actions:  Be physically active.  Do moderate-intensity physical activity for at least 30 minutes on at least 5 days of the week, or as much as told by your health care provider. This could be brisk walking, biking, or water aerobics.  Ask your health care provider what activities are safe for you. A mix of physical activities may be best, such as walking, swimming, cycling, and strength training.  Lose weight as told by your health care provider.  Losing 5-7% of your body weight can reverse insulin resistance.  Your health care provider can determine how much weight loss is best for you and can help you lose weight safely.  Follow a healthy meal plan. This includes eating lean proteins, complex carbohydrates, fresh  fruits and vegetables, low-fat dairy products, and healthy fats.  Follow instructions from your health care provider about eating or drinking restrictions.  Make an appointment to see a diet and nutrition specialist (registered dietitian) to help you create a healthy eating plan that is right for you.  Do not smoke  or use any tobacco products, such as cigarettes, chewing tobacco, and e-cigarettes. If you need help quitting, ask your health care provider.  Take over-the-counter and prescription medicines as told by your health care provider. You may be prescribed medicines that help lower the risk of type 2 diabetes. This information is not intended to replace advice given to you by your health care provider. Make sure you discuss any questions you have with your health care provider. Document Released: 10/03/2015 Document Revised: 11/17/2015 Document Reviewed: 08/02/2015 Elsevier Interactive Patient Education  2017 [employer].

## 2016-08-08 NOTE — Progress Notes (Signed)
Rachel Woodard, is a 66 y.o. female  [id number]  [id number]  DOB - [birth year]  CC:  Chief Complaint  Patient presents with  . New Patient (Initial Visit)       HPI: Rachel Woodard is a 66 y.o. female here today to establish medical care.  Her pcp was in [facility], has since moved to [place] for almost year.  Pmhx signif for mdd/anxiety, substance abuse (crack cocaine) in remission x 55months now, recently moved to new rehab house, htn, gerd, fibromyalgia.  She has recent tah/bso 11/17, cbc unremarkable at that time.  Currently w/o any acute complaints post tah/bso. Does co of vaginal dryness with intercourse.  Denies hotflashes.  Watches her diet, denies etoh, but does smoke 1/2ppd tob. Not quite ready to stop smoking.  Has been seeing ortho, for bilat knee oa, recd R tKA by Dr Erlinda Hong 06/14/16, but now she is seeing a new program to help her w/ her knee pains to avoid surgery.  She is also about to start an exercise program and nutrisystem for weightloss. She gets a discount on the programs w/ her [employer].  Pt states she is inprocess of getting her MM films from [facility] and will be sending it to the Breast screening center that she is in contact w/.  She did colonoscopy about 5 years ago, recalled it was [facility], in [place].  Patient has No headache, No chest pain, No abdominal pain - No Nausea, No new weakness tingling or numbness, No Cough - SOB.    Review of Systems: Per hpi, o/w all systems reviewed and negative.    Allergies  Allergen Reactions  . Lisinopril    Past Medical History:  Diagnosis Date  . Anxiety   . Breast cancer ([place])   . Depression   . Hypertension   . PTSD (post-traumatic stress disorder)    Current Outpatient Prescriptions on File Prior to Visit  Medication Sig Dispense Refill  . gabapentin (NEURONTIN) 300 MG capsule Take 300 mg by mouth daily.    . silver sulfADIAZINE (SILVADENE) 1 % cream Apply 1 application  topically daily. (Patient not taking: Reported on 06/14/2016) 50 g 0  . sulfamethoxazole-trimethoprim (BACTRIM DS,SEPTRA DS) 800-160 MG tablet Take 1 tablet by mouth 2 (two) times daily. (Patient not taking: Reported on 06/14/2016) 10 tablet 0   No current facility-administered medications on file prior to visit.    No family history on file. Social History   Social History  . Marital status: Single    Spouse name: N/A  . Number of children: N/A  . Years of education: N/A   Occupational History  . Not on file.   Social History Main Topics  . Smoking status: Current Every Day Smoker  . Smokeless tobacco: Never Used  . Alcohol use Yes  . Drug use: No     Comment: clean x 50 days on 11/28/15  . Sexual activity: Not on file   Other Topics Concern  . Not on file   Social History Narrative  . No narrative on file    Objective:   Vitals:   08/08/16 1019  BP: 131/84  Pulse: 80  Resp: 16  Temp: 98.5 F (36.9 C)    Filed Weights   08/08/16 1019  Weight: 240 lb 9.6 oz (109.1 kg)    BP Readings from Last 3 Encounters:  08/08/16 131/84  04/12/16 134/68  04/08/16 138/82    Physical Exam: Constitutional: Patient appears well-developed and well-nourished. No distress. AAOx3, obese,  pleasant. HENT: Normocephalic, atraumatic, External right and left ear normal. Oropharynx is clear and moist. bilat TMS clear. Eyes: Conjunctivae and EOM are normal. PERRL, no scleral icterus. Neck: Normal ROM. Neck supple. No JVD. No tracheal deviation. No thyromegaly. CVS: RRR, S1/S2 +, no murmurs, no gallops, no carotid bruit.  Pulmonary: Effort and breath sounds normal, no stridor, rhonchi, wheezes, rales.  Abdominal: Soft. BS +, obese, NO tenderness, rebound or guarding.  Musculoskeletal: Normal range of motion. No edema and no tenderness.  LE: bilat/ no c/c/e, pulses 2+ bilateral. Neuro: Alert.  muscle tone coordination wnl. No cranial nerve deficit grossly. Skin: Skin is warm and dry.  No rash noted. Not diaphoretic. No erythema. No pallor. Psychiatric: Normal mood and affect. Behavior, judgment, thought content normal.  No results found for: WBC, HGB, HCT, MCV, PLT No results found for: CREATININE, BUN, NA, K, CL, CO2  Lab Results  Component Value Date   HGBA1C 5.7 08/08/2016   Lipid Panel  No results found for: CHOL, TRIG, HDL, CHOLHDL, VLDL, LDLCALC      Depression screen [facility] 2/9 08/08/2016  Decreased Interest 0  Down, Depressed, Hopeless 0  PHQ - 2 Score 0    Assessment and plan:   1. HTN (hypertension), benign Well controlled, - continue norvasc 10qd, hctz 12.5qd, coreg 3.125bid. - low salt/dash diet discussed, increase exercise - CMP and Liver  2. Osteoarthritis of both knees, unspecified osteoarthritis type Recd by Dr Erlinda Hong ortho in 12/17 for tka  R, but she is starting to see a different program (Flegenic sp?) to see if would help avoid surgery.  3. Prediabetes a1c 5.7, d/w at length about carb limiting diet/increase exercise, info provided. - pt amendable to starting metformin 500 qd.  Metformin helps your body process sugars better, also helps w/ some weight loss as well.   But can cause some indigestion/n/diarrhea, but sx usually resolved in 2 wks or so.  Pt understands, and will call if intolerant of metformin, could switch to long acting metformin than.  4. Recurrent major depressive disorder, in full remission ([place]) - on effexor 225mg  total daily, takes in am, currently not seeing psyche. - pt denies si/hi/avh, says she is pretty well controlled currently on current regimen.  - Ambulatory referral to Psychiatry  5. Anxiety See #1  6. Fibromyalgia Per pt, take neurontin for it, renewed neurontin.  7. Snoring Pt c/o of night time snoring, sometimes w/ difficulty sleeping, but denies daytime somnolence. She states her ob in past has recd sleep study.  - Split night study; Future  - to eval osa.  8. Vaginal dryness, menopausal Trial estrace  vag cream (we do not carry premarin in our pharmacy)  9. Encounter for vitamin deficiency screening - VITAMIN D 25 Hydroxy (Vit-D Deficiency, Fractures) - recd calcium 1200mg /daily  10. Thyroid disorder screen In setting of morbid obesity - TSH  11. Lipid screening - Lipid Panel - fasting  12. Encounter for immunization - tdap today -pneumococcal 23v today - Flu Vaccine QUAD 36+ mos IM   Return in about 3 months (around 11/05/2016), or if symptoms worsen or fail to improve.  The patient was given clear instructions to go to ER or return to medical center if symptoms don't improve, worsen or new problems develop. The patient verbalized understanding. The patient was told to call to get lab results if they haven't heard anything in the next week.    This note has been created with Museum/gallery curator and smart phrase  technology. Any transcriptional errors are unintentional.   Maren Reamer, MD, [place], [place]   08/08/2016, 11:38 AM

## 2016-08-09 LAB — VITAMIN D 25 HYDROXY (VIT D DEFICIENCY, FRACTURES): Vit D, 25-Hydroxy: 7 ng/mL — ABNORMAL LOW (ref 30–100)

## 2016-08-14 ENCOUNTER — Other Ambulatory Visit: Payer: Self-pay | Admitting: Internal Medicine

## 2016-08-14 MED ORDER — VITAMIN D (ERGOCALCIFEROL) 1.25 MG (50000 UNIT) PO CAPS: 50000.0000 [IU] | ORAL_CAPSULE | ORAL | 0 refills | Status: DC

## 2016-08-21 DIAGNOSIS — F1020 Alcohol dependence, uncomplicated: Secondary | ICD-10-CM | POA: Diagnosis not present

## 2016-08-21 DIAGNOSIS — F1420 Cocaine dependence, uncomplicated: Secondary | ICD-10-CM | POA: Diagnosis not present

## 2016-08-22 DIAGNOSIS — F419 Anxiety disorder, unspecified: Secondary | ICD-10-CM | POA: Diagnosis not present

## 2016-08-22 DIAGNOSIS — F4310 Post-traumatic stress disorder, unspecified: Secondary | ICD-10-CM | POA: Diagnosis not present

## 2016-08-22 DIAGNOSIS — F339 Major depressive disorder, recurrent, unspecified: Secondary | ICD-10-CM | POA: Diagnosis not present

## 2016-08-23 DIAGNOSIS — F1020 Alcohol dependence, uncomplicated: Secondary | ICD-10-CM | POA: Diagnosis not present

## 2016-08-23 DIAGNOSIS — F1420 Cocaine dependence, uncomplicated: Secondary | ICD-10-CM | POA: Diagnosis not present

## 2016-08-24 DIAGNOSIS — M797 Fibromyalgia: Secondary | ICD-10-CM | POA: Diagnosis not present

## 2016-08-24 DIAGNOSIS — F1490 Cocaine use, unspecified, uncomplicated: Secondary | ICD-10-CM | POA: Diagnosis not present

## 2016-08-24 DIAGNOSIS — I10 Essential (primary) hypertension: Secondary | ICD-10-CM | POA: Diagnosis not present

## 2016-08-24 DIAGNOSIS — Z0289 Encounter for other administrative examinations: Secondary | ICD-10-CM | POA: Diagnosis not present

## 2016-08-27 DIAGNOSIS — F1020 Alcohol dependence, uncomplicated: Secondary | ICD-10-CM | POA: Diagnosis not present

## 2016-08-27 DIAGNOSIS — F1420 Cocaine dependence, uncomplicated: Secondary | ICD-10-CM | POA: Diagnosis not present

## 2016-08-29 DIAGNOSIS — F1020 Alcohol dependence, uncomplicated: Secondary | ICD-10-CM | POA: Diagnosis not present

## 2016-08-29 DIAGNOSIS — F1420 Cocaine dependence, uncomplicated: Secondary | ICD-10-CM | POA: Diagnosis not present

## 2016-08-30 ENCOUNTER — Telehealth: Payer: Self-pay | Admitting: Internal Medicine

## 2016-08-30 MED ORDER — RANITIDINE HCL 150 MG PO TABS: 150.0000 mg | ORAL_TABLET | Freq: Two times a day (BID) | ORAL | 0 refills | Status: DC

## 2016-08-30 MED ORDER — VENLAFAXINE HCL 75 MG PO TABS: 75.0000 mg | ORAL_TABLET | Freq: Every day | ORAL | 0 refills | Status: DC

## 2016-08-30 MED ORDER — METFORMIN HCL 500 MG PO TABS: 500.0000 mg | ORAL_TABLET | Freq: Every day | ORAL | 3 refills | Status: DC

## 2016-08-30 MED ORDER — HYDROCHLOROTHIAZIDE 12.5 MG PO TABS: 12.5000 mg | ORAL_TABLET | Freq: Every day | ORAL | 0 refills | Status: DC

## 2016-08-30 MED ORDER — AMLODIPINE BESYLATE 10 MG PO TABS: 10.0000 mg | ORAL_TABLET | Freq: Every day | ORAL | 0 refills | Status: DC

## 2016-08-30 MED ORDER — ESTRADIOL 0.1 MG/GM VA CREA: 1.0000 | TOPICAL_CREAM | Freq: Every day | VAGINAL | 0 refills | Status: DC

## 2016-08-30 MED ORDER — ESTRADIOL 0.1 MG/GM VA CREA
1.0000 | TOPICAL_CREAM | Freq: Every day | VAGINAL | 0 refills | Status: DC
Start: 2016-08-30 — End: 2017-02-14

## 2016-08-30 MED ORDER — CARVEDILOL 3.125 MG PO TABS: 3.1250 mg | ORAL_TABLET | Freq: Two times a day (BID) | ORAL | 0 refills | Status: DC

## 2016-08-30 NOTE — Telephone Encounter (Signed)
Patient called asking to please send her medications to [facility].  venlafaxine (EFFEXOR) 75 MG tablet  ranitidine (ZANTAC) 150 MG tablet  metFORMIN (GLUCOPHAGE) 500 MG tablet  hydrochlorothiazide (HYDRODIURIL) 12.5 MG tablet  estradiol (ESTRACE VAGINAL) 0.1 MG/GM vaginal cream  carvedilol (COREG) 3.125 MG tablet  amLODipine (NORVASC) 10 MG tablet    Thank you.

## 2016-09-03 DIAGNOSIS — F1420 Cocaine dependence, uncomplicated: Secondary | ICD-10-CM | POA: Diagnosis not present

## 2016-09-03 DIAGNOSIS — F1020 Alcohol dependence, uncomplicated: Secondary | ICD-10-CM | POA: Diagnosis not present

## 2016-09-05 DIAGNOSIS — F1020 Alcohol dependence, uncomplicated: Secondary | ICD-10-CM | POA: Diagnosis not present

## 2016-09-05 DIAGNOSIS — F1420 Cocaine dependence, uncomplicated: Secondary | ICD-10-CM | POA: Diagnosis not present

## 2016-09-10 DIAGNOSIS — F1020 Alcohol dependence, uncomplicated: Secondary | ICD-10-CM | POA: Diagnosis not present

## 2016-09-10 DIAGNOSIS — F1420 Cocaine dependence, uncomplicated: Secondary | ICD-10-CM | POA: Diagnosis not present

## 2016-09-12 DIAGNOSIS — F1020 Alcohol dependence, uncomplicated: Secondary | ICD-10-CM | POA: Diagnosis not present

## 2016-09-12 DIAGNOSIS — F1420 Cocaine dependence, uncomplicated: Secondary | ICD-10-CM | POA: Diagnosis not present

## 2016-09-18 DIAGNOSIS — F1020 Alcohol dependence, uncomplicated: Secondary | ICD-10-CM | POA: Diagnosis not present

## 2016-09-18 DIAGNOSIS — F1420 Cocaine dependence, uncomplicated: Secondary | ICD-10-CM | POA: Diagnosis not present

## 2016-09-20 DIAGNOSIS — F1020 Alcohol dependence, uncomplicated: Secondary | ICD-10-CM | POA: Diagnosis not present

## 2016-09-20 DIAGNOSIS — F1420 Cocaine dependence, uncomplicated: Secondary | ICD-10-CM | POA: Diagnosis not present

## 2016-09-24 DIAGNOSIS — F1420 Cocaine dependence, uncomplicated: Secondary | ICD-10-CM | POA: Diagnosis not present

## 2016-09-24 DIAGNOSIS — F1020 Alcohol dependence, uncomplicated: Secondary | ICD-10-CM | POA: Diagnosis not present

## 2016-09-26 ENCOUNTER — Ambulatory Visit (HOSPITAL_BASED_OUTPATIENT_CLINIC_OR_DEPARTMENT_OTHER): Payer: Medicare HMO | Attending: Internal Medicine | Admitting: Internal Medicine

## 2016-09-26 VITALS — Ht 66.0 in | Wt 230.0 lb

## 2016-09-26 DIAGNOSIS — Z6838 Body mass index (BMI) 38.0-38.9, adult: Secondary | ICD-10-CM | POA: Diagnosis not present

## 2016-09-26 DIAGNOSIS — G4733 Obstructive sleep apnea (adult) (pediatric): Secondary | ICD-10-CM | POA: Insufficient documentation

## 2016-09-26 DIAGNOSIS — R0683 Snoring: Secondary | ICD-10-CM

## 2016-09-26 DIAGNOSIS — I10 Essential (primary) hypertension: Secondary | ICD-10-CM | POA: Insufficient documentation

## 2016-09-26 DIAGNOSIS — E669 Obesity, unspecified: Secondary | ICD-10-CM | POA: Diagnosis not present

## 2016-09-27 ENCOUNTER — Telehealth: Payer: Self-pay | Admitting: Internal Medicine

## 2016-09-27 ENCOUNTER — Encounter (HOSPITAL_BASED_OUTPATIENT_CLINIC_OR_DEPARTMENT_OTHER): Payer: Medicare HMO

## 2016-09-27 NOTE — Telephone Encounter (Signed)
Will forward to Dr. Jegede.  

## 2016-09-27 NOTE — Telephone Encounter (Signed)
[facility] calling to discuss patient's Rx of Effexor. Pharmacist needs to discuss that the 75 MG is immediate release and the 150 MG tablet is extended release   Can speak with anyone in the pharmacy. CB# [phone]

## 2016-09-28 ENCOUNTER — Other Ambulatory Visit: Payer: Self-pay | Admitting: Internal Medicine

## 2016-09-28 ENCOUNTER — Other Ambulatory Visit: Payer: Self-pay | Admitting: Pharmacist

## 2016-09-28 DIAGNOSIS — F1020 Alcohol dependence, uncomplicated: Secondary | ICD-10-CM | POA: Diagnosis not present

## 2016-09-28 DIAGNOSIS — Z1211 Encounter for screening for malignant neoplasm of colon: Secondary | ICD-10-CM

## 2016-09-28 DIAGNOSIS — F1420 Cocaine dependence, uncomplicated: Secondary | ICD-10-CM | POA: Diagnosis not present

## 2016-09-28 MED ORDER — VENLAFAXINE HCL ER 75 MG PO CP24: 75.0000 mg | ORAL_CAPSULE | Freq: Every day | ORAL | 2 refills | Status: DC

## 2016-09-28 NOTE — Telephone Encounter (Signed)
Addressed with the Pharmacist

## 2016-09-28 NOTE — Telephone Encounter (Signed)
Will forward to [place]

## 2016-09-28 NOTE — Telephone Encounter (Signed)
Changed venlafaxine capsules to both be XR

## 2016-09-29 DIAGNOSIS — R0683 Snoring: Secondary | ICD-10-CM

## 2016-09-29 NOTE — Procedures (Signed)
Patient Name: Rachel Woodard, [place] Date: 09/26/2016 Gender: Female D.O.B: Nov 24, 1950 Age (years): 12 Referring Provider: Maren Reamer Height (inches): 74 Interpreting Physician: Baird Lyons MD, ABSM Weight (lbs): 230 RPSGT: Carolin Coy BMI: 38 MRN: [id number] Neck Size: 13.00 CLINICAL INFORMATION Sleep Study Type: Split Night CPAP  Indication for sleep study: Hypertension, Obesity, Snoring  Epworth Sleepiness Score: 10  SLEEP STUDY TECHNIQUE As per the AASM Manual for the Scoring of Sleep and Associated Events v2.3 (April 2016) with a hypopnea requiring 4% desaturations.  The channels recorded and monitored were frontal, central and occipital EEG, electrooculogram (EOG), submentalis EMG (chin), nasal and oral airflow, thoracic and abdominal wall motion, anterior tibialis EMG, snore microphone, electrocardiogram, and pulse oximetry. Continuous positive airway pressure (CPAP) was initiated when the patient met split night criteria and was titrated according to treat sleep-disordered breathing.  MEDICATIONS Medications self-administered by patient taken the night of the study : none reported  RESPIRATORY PARAMETERS Diagnostic  Total AHI (/hr): 17.2 RDI (/hr): 19.5 OA Index (/hr): 3.1 CA Index (/hr): 3.1 REM AHI (/hr): 19.8 NREM AHI (/hr): 7.3 Supine AHI (/hr): 20.2 Non-supine AHI (/hr): 0.00 Min O2 Sat (%): 86.00 Mean O2 (%): 93.53 Time below 88% (min): 0.8   Titration  Optimal Pressure (cm): 14 AHI at Optimal Pressure (/hr): 3.7 Min O2 at Optimal Pressure (%): 95.0 Supine % at Optimal (%): 100 Sleep % at Optimal (%): 100    SLEEP ARCHITECTURE The recording time for the entire night was 389.8 minutes.  During a baseline period of 207.5 minutes, the patient slept for 157.0 minutes in REM and nonREM, yielding a sleep efficiency of 75.6%. Sleep onset after lights out was 40.8 minutes with a REM latency of 37.5 minutes. The patient spent 7.64% of the night in  stage N1 sleep, 13.38% in stage N2 sleep, 0.00% in stage N3 and 78.98% in REM.  During the titration period of 178.3 minutes, the patient slept for 148.0 minutes in REM and nonREM, yielding a sleep efficiency of 83.0%. Sleep onset after CPAP initiation was 11.8 minutes with a REM latency of 104.0 minutes. The patient spent 15.54% of the night in stage N1 sleep, 72.30% in stage N2 sleep, 0.00% in stage N3 and 12.16% in REM.  CARDIAC DATA The 2 lead EKG demonstrated sinus rhythm. The mean heart rate was 63.31 beats per minute. Other EKG findings include: PVCs  LEG MOVEMENT DATA The total Periodic Limb Movements of Sleep (PLMS) were 4. The PLMS index was 0.79 .  IMPRESSIONS - Moderate obstructive sleep apnea occurred during the diagnostic portion of the study(AHI = 17.2/hour). An optimal PAP pressure was selected for this patient ( 14 cm of water) - Mild central sleep apnea occurred during the diagnostic portion of the study (CAI = 3.1/hour). - The patient had minimal or no oxygen desaturation during the diagnostic portion of the study (Min O2 = 86.00%) - The patient snored with Soft snoring volume during the diagnostic portion of the study. - EKG findings include PVCs. - Clinically significant periodic limb movements did not occur during sleep.  DIAGNOSIS - Obstructive Sleep Apnea (327.23 [G47.33 ICD-10])  RECOMMENDATIONS - Trial of CPAP therapy on 14 cm H2O with a Standard size Resmed Nasal Mask [place] FX mask and heated humidification. - Avoid alcohol, sedatives and other CNS depressants that may worsen sleep apnea and disrupt normal sleep architecture. - Sleep hygiene should be reviewed to assess factors that may improve sleep quality. - Weight management and regular exercise should be  initiated or continued.  [Electronically signed] 09/29/2016 12:14 PM  Baird Lyons MD, [place], American Board of Sleep Medicine   NPI: [id number]  [place], [place]  of Sleep Medicine  ELECTRONICALLY SIGNED ON:  09/29/2016, 12:13 PM [place] PH: [phone]   FX: [phone] [place]

## 2016-10-01 ENCOUNTER — Telehealth: Payer: Self-pay

## 2016-10-01 DIAGNOSIS — F1420 Cocaine dependence, uncomplicated: Secondary | ICD-10-CM | POA: Diagnosis not present

## 2016-10-01 DIAGNOSIS — F1020 Alcohol dependence, uncomplicated: Secondary | ICD-10-CM | POA: Diagnosis not present

## 2016-10-01 NOTE — Telephone Encounter (Signed)
Call received from the patient. She has no preferred DME provider and was agreeable to the prescription being faxed to [facility].  Prescription for CPAP faxed to [facility] - fax # [phone]  Update provided to Dr Janne Napoleon

## 2016-10-01 NOTE — Telephone Encounter (Signed)
Attempted to contact the patient to let her know that an order for a CPAP has been written and to inquire if she has a preferred DME provider. Call placed to # [phone] (M) and a HIPAA compliant voicemail message was left requesting a call back to # [phone]

## 2016-10-02 ENCOUNTER — Telehealth: Payer: Self-pay

## 2016-10-02 NOTE — Telephone Encounter (Signed)
Call placed to [facility] - CPAP team, spoke to [place] who confirmed that the referral has been received. She noted that they are waiting for confirmation from the insurance to process the request.

## 2016-10-05 DIAGNOSIS — F1020 Alcohol dependence, uncomplicated: Secondary | ICD-10-CM | POA: Diagnosis not present

## 2016-10-05 DIAGNOSIS — F1420 Cocaine dependence, uncomplicated: Secondary | ICD-10-CM | POA: Diagnosis not present

## 2016-10-08 ENCOUNTER — Ambulatory Visit: Payer: Medicare HMO | Admitting: Internal Medicine

## 2016-10-08 DIAGNOSIS — F1020 Alcohol dependence, uncomplicated: Secondary | ICD-10-CM | POA: Diagnosis not present

## 2016-10-08 DIAGNOSIS — F1420 Cocaine dependence, uncomplicated: Secondary | ICD-10-CM | POA: Diagnosis not present

## 2016-10-10 DIAGNOSIS — F1420 Cocaine dependence, uncomplicated: Secondary | ICD-10-CM | POA: Diagnosis not present

## 2016-10-10 DIAGNOSIS — F1020 Alcohol dependence, uncomplicated: Secondary | ICD-10-CM | POA: Diagnosis not present

## 2016-10-15 DIAGNOSIS — F1420 Cocaine dependence, uncomplicated: Secondary | ICD-10-CM | POA: Diagnosis not present

## 2016-10-15 DIAGNOSIS — F1020 Alcohol dependence, uncomplicated: Secondary | ICD-10-CM | POA: Diagnosis not present

## 2016-10-17 DIAGNOSIS — F1420 Cocaine dependence, uncomplicated: Secondary | ICD-10-CM | POA: Diagnosis not present

## 2016-10-17 DIAGNOSIS — F1020 Alcohol dependence, uncomplicated: Secondary | ICD-10-CM | POA: Diagnosis not present

## 2016-10-22 DIAGNOSIS — F1020 Alcohol dependence, uncomplicated: Secondary | ICD-10-CM | POA: Diagnosis not present

## 2016-10-22 DIAGNOSIS — F1420 Cocaine dependence, uncomplicated: Secondary | ICD-10-CM | POA: Diagnosis not present

## 2016-10-26 DIAGNOSIS — F1420 Cocaine dependence, uncomplicated: Secondary | ICD-10-CM | POA: Diagnosis not present

## 2016-10-26 DIAGNOSIS — F1020 Alcohol dependence, uncomplicated: Secondary | ICD-10-CM | POA: Diagnosis not present

## 2016-10-29 DIAGNOSIS — F1020 Alcohol dependence, uncomplicated: Secondary | ICD-10-CM | POA: Diagnosis not present

## 2016-10-29 DIAGNOSIS — F1420 Cocaine dependence, uncomplicated: Secondary | ICD-10-CM | POA: Diagnosis not present

## 2016-10-31 DIAGNOSIS — F1420 Cocaine dependence, uncomplicated: Secondary | ICD-10-CM | POA: Diagnosis not present

## 2016-10-31 DIAGNOSIS — F1020 Alcohol dependence, uncomplicated: Secondary | ICD-10-CM | POA: Diagnosis not present

## 2016-11-05 ENCOUNTER — Ambulatory Visit: Payer: Medicare HMO | Admitting: Internal Medicine

## 2016-11-05 ENCOUNTER — Telehealth: Payer: Self-pay | Admitting: Pharmacist

## 2016-11-05 ENCOUNTER — Other Ambulatory Visit: Payer: Self-pay | Admitting: Internal Medicine

## 2016-11-05 DIAGNOSIS — F1020 Alcohol dependence, uncomplicated: Secondary | ICD-10-CM | POA: Diagnosis not present

## 2016-11-05 DIAGNOSIS — F339 Major depressive disorder, recurrent, unspecified: Secondary | ICD-10-CM

## 2016-11-05 DIAGNOSIS — F1420 Cocaine dependence, uncomplicated: Secondary | ICD-10-CM | POA: Diagnosis not present

## 2016-11-05 NOTE — Telephone Encounter (Signed)
Received fax from [facility] for refills for both strengths of venlafaxine. Per Dr. Jerrye Bushy note, addressed by her and I will not refill.

## 2016-11-05 NOTE — Progress Notes (Signed)
Pt originally had appt w/ me at 845am, but looks like she rescheduled to next week. She is on very high dose venlafaxine xr 225mg  qd  (150mg  + 75 qd). I placed a referral to psychiatry in Feb, but looks like she has not f/u. I am renewing her venlafaxine 1 more time. No refills (per her [place] request form), but she needs to keep appt w/ me next week. I put in referral again for her to see Psychiatry.

## 2016-11-07 DIAGNOSIS — F1020 Alcohol dependence, uncomplicated: Secondary | ICD-10-CM | POA: Diagnosis not present

## 2016-11-07 DIAGNOSIS — F1420 Cocaine dependence, uncomplicated: Secondary | ICD-10-CM | POA: Diagnosis not present

## 2016-11-08 ENCOUNTER — Encounter: Payer: Self-pay | Admitting: Internal Medicine

## 2016-11-12 DIAGNOSIS — F1020 Alcohol dependence, uncomplicated: Secondary | ICD-10-CM | POA: Diagnosis not present

## 2016-11-12 DIAGNOSIS — F1420 Cocaine dependence, uncomplicated: Secondary | ICD-10-CM | POA: Diagnosis not present

## 2016-11-13 ENCOUNTER — Ambulatory Visit: Payer: Medicare HMO | Attending: Internal Medicine | Admitting: Internal Medicine

## 2016-11-13 ENCOUNTER — Encounter: Payer: Self-pay | Admitting: Internal Medicine

## 2016-11-13 VITALS — BP 122/82 | HR 80 | Temp 98.3°F | Resp 16 | Wt 224.2 lb

## 2016-11-13 DIAGNOSIS — F1410 Cocaine abuse, uncomplicated: Secondary | ICD-10-CM | POA: Insufficient documentation

## 2016-11-13 DIAGNOSIS — E559 Vitamin D deficiency, unspecified: Secondary | ICD-10-CM | POA: Diagnosis not present

## 2016-11-13 DIAGNOSIS — F329 Major depressive disorder, single episode, unspecified: Secondary | ICD-10-CM | POA: Insufficient documentation

## 2016-11-13 DIAGNOSIS — K219 Gastro-esophageal reflux disease without esophagitis: Secondary | ICD-10-CM | POA: Insufficient documentation

## 2016-11-13 DIAGNOSIS — N3941 Urge incontinence: Secondary | ICD-10-CM | POA: Diagnosis not present

## 2016-11-13 DIAGNOSIS — I10 Essential (primary) hypertension: Secondary | ICD-10-CM | POA: Insufficient documentation

## 2016-11-13 DIAGNOSIS — F4310 Post-traumatic stress disorder, unspecified: Secondary | ICD-10-CM | POA: Insufficient documentation

## 2016-11-13 DIAGNOSIS — Z853 Personal history of malignant neoplasm of breast: Secondary | ICD-10-CM | POA: Insufficient documentation

## 2016-11-13 DIAGNOSIS — R7303 Prediabetes: Secondary | ICD-10-CM | POA: Insufficient documentation

## 2016-11-13 DIAGNOSIS — Z7984 Long term (current) use of oral hypoglycemic drugs: Secondary | ICD-10-CM | POA: Insufficient documentation

## 2016-11-13 DIAGNOSIS — M797 Fibromyalgia: Secondary | ICD-10-CM | POA: Insufficient documentation

## 2016-11-13 LAB — GLUCOSE, POCT (MANUAL RESULT ENTRY): POC GLUCOSE: 105 mg/dL — AB (ref 70–99)

## 2016-11-13 MED ORDER — VENLAFAXINE HCL ER 75 MG PO CP24: 75.0000 mg | ORAL_CAPSULE | Freq: Every day | ORAL | 2 refills | Status: DC

## 2016-11-13 MED ORDER — HYDROCHLOROTHIAZIDE 12.5 MG PO TABS: 12.5000 mg | ORAL_TABLET | Freq: Every day | ORAL | 3 refills | Status: DC

## 2016-11-13 MED ORDER — OXYBUTYNIN CHLORIDE ER 5 MG PO TB24: 5.0000 mg | ORAL_TABLET | Freq: Every day | ORAL | 2 refills | Status: DC

## 2016-11-13 MED ORDER — RANITIDINE HCL 150 MG PO TABS: 150.0000 mg | ORAL_TABLET | Freq: Two times a day (BID) | ORAL | 3 refills | Status: DC

## 2016-11-13 MED ORDER — AMLODIPINE BESYLATE 10 MG PO TABS: 10.0000 mg | ORAL_TABLET | Freq: Every day | ORAL | 3 refills | Status: DC

## 2016-11-13 MED ORDER — CARVEDILOL 3.125 MG PO TABS: 3.1250 mg | ORAL_TABLET | Freq: Two times a day (BID) | ORAL | 3 refills | Status: DC

## 2016-11-13 MED ORDER — VENLAFAXINE HCL ER 150 MG PO CP24: 150.0000 mg | ORAL_CAPSULE | Freq: Every day | ORAL | 3 refills | Status: DC

## 2016-11-13 NOTE — Patient Instructions (Addendum)
Calcium 1200mg  /daily Vit D - checking today.  - follow up on Mammogram - bring in colonoscopy report next visit.  For your tobacco abuse: Strongly recommend that he stop smoking back and all other inhaled products right away Call 1-800-Quit-Now to get free nicotine replacement from the state of [place]  - remember to get the scheduled CPAP fitting /machine!! For sleep apnea  Steps to Quit Smoking Smoking tobacco can be bad for your health. It can also affect almost every organ in your body. Smoking puts you and people around you at risk for many serious long-lasting (chronic) diseases. Quitting smoking is hard, but it is one of the best things that you can do for your health. It is never too late to quit. What are the benefits of quitting smoking? When you quit smoking, you lower your risk for getting serious diseases and conditions. They can include:  Lung cancer or lung disease.  Heart disease.  Stroke.  Heart attack.  Not being able to have children (infertility).  Weak bones (osteoporosis) and broken bones (fractures). If you have coughing, wheezing, and shortness of breath, those symptoms may get better when you quit. You may also get sick less often. If you are pregnant, quitting smoking can help to lower your chances of having a baby of low birth weight. What can I do to help me quit smoking? Talk with your doctor about what can help you quit smoking. Some things you can do (strategies) include:  Quitting smoking totally, instead of slowly cutting back how much you smoke over a period of time.  Going to in-person counseling. You are more likely to quit if you go to many counseling sessions.  Using resources and support systems, such as:  Online chats with a Social worker.  Phone quitlines.  Printed Furniture conservator/restorer.  Support groups or group counseling.  Text messaging programs.  Mobile phone apps or applications.  Taking medicines. Some of these medicines may have  nicotine in them. If you are pregnant or breastfeeding, do not take any medicines to quit smoking unless your doctor says it is okay. Talk with your doctor about counseling or other things that can help you. Talk with your doctor about using more than one strategy at the same time, such as taking medicines while you are also going to in-person counseling. This can help make quitting easier. What things can I do to make it easier to quit? Quitting smoking might feel very hard at first, but there is a lot that you can do to make it easier. Take these steps:  Talk to your family and friends. Ask them to support and encourage you.  Call phone quitlines, reach out to support groups, or work with a Social worker.  Ask people who smoke to not smoke around you.  Avoid places that make you want (trigger) to smoke, such as:  Bars.  Parties.  Smoke-break areas at work.  Spend time with people who do not smoke.  Lower the stress in your life. Stress can make you want to smoke. Try these things to help your stress:  Getting regular exercise.  Deep-breathing exercises.  Yoga.  Meditating.  Doing a body scan. To do this, close your eyes, focus on one area of your body at a time from head to toe, and notice which parts of your body are tense. Try to relax the muscles in those areas.  Download or buy apps on your mobile phone or tablet that can help you stick to  your quit plan. There are many free apps, such as QuitGuide from the [employer] Office manager for Disease Control and Prevention). You can find more support from smokefree.gov and other websites. This information is not intended to replace advice given to you by your health care provider. Make sure you discuss any questions you have with your health care provider. Document Released: 04/07/2009 Document Revised: 02/07/2016 Document Reviewed: 10/26/2014 Elsevier Interactive Patient Education  2017 [employer].   Kegel Exercises Kegel exercises help  strengthen the muscles that support the rectum, vagina, small intestine, bladder, and uterus. Doing Kegel exercises can help:  Improve bladder and bowel control.  Improve sexual response.  Reduce problems and discomfort during pregnancy. Kegel exercises involve squeezing your pelvic floor muscles, which are the same muscles you squeeze when you try to stop the flow of urine. The exercises can be done while sitting, standing, or lying down, but it is best to vary your position. Phase 1 exercises 1. Squeeze your pelvic floor muscles tight. You should feel a tight lift in your rectal area. If you are a female, you should also feel a tightness in your vaginal area. Keep your stomach, buttocks, and legs relaxed. 2. Hold the muscles tight for up to 10 seconds. 3. Relax your muscles. Repeat this exercise 50 times a day or as many times as told by your health care provider. Continue to do this exercise for at least 4-6 weeks or for as long as told by your health care provider. This information is not intended to replace advice given to you by your health care provider. Make sure you discuss any questions you have with your health care provider. Document Released: 05/28/2012 Document Revised: 02/04/2016 Document Reviewed: 05/01/2015 Elsevier Interactive Patient Education  2017 Else[employer] Inc.  - Urinary Incontinence Urinary incontinence is the involuntary loss of urine from your bladder. What are the causes? There are many causes of urinary incontinence. They include:  Medicines.  Infections.  Prostatic enlargement, leading to overflow of urine from your bladder.  Surgery.  Neurological diseases.  Emotional factors. What are the signs or symptoms? Urinary Incontinence can be divided into four types: 4. Urge incontinence. Urge incontinence is the involuntary loss of urine before you have the opportunity to go to the bathroom. There is a sudden urge to void but not enough time to reach a  bathroom. 5. Stress incontinence. Stress incontinence is the sudden loss of urine with any activity that forces urine to pass. It is commonly caused by anatomical changes to the pelvis and sphincter areas of your body. 6. Overflow incontinence. Overflow incontinence is the loss of urine from an obstructed opening to your bladder. This results in a backup of urine and a resultant buildup of pressure within the bladder. When the pressure within the bladder exceeds the closing pressure of the sphincter, the urine overflows, which causes incontinence, similar to water overflowing a dam. 7. Total incontinence. Total incontinence is the loss of urine as a result of the inability to store urine within your bladder. How is this diagnosed? Evaluating the cause of incontinence may require:  A thorough and complete medical and obstetric history.  A complete physical exam.  Laboratory tests such as a urine culture and sensitivities. When additional tests are indicated, they can include:  An ultrasound exam.  Kidney and bladder X-rays.  Cystoscopy. This is an exam of the bladder using a narrow scope.  Urodynamic testing to test the nerve function to the bladder and sphincter areas. How  is this treated? Treatment for urinary incontinence depends on the cause:  For urge incontinence caused by a bacterial infection, antibiotics will be prescribed. If the urge incontinence is related to medicines you take, your health care provider may have you change the medicine.  For stress incontinence, surgery to re-establish anatomical support to the bladder or sphincter, or both, will often correct the condition.  For overflow incontinence caused by an enlarged prostate, an operation to open the channel through the enlarged prostate will allow the flow of urine out of the bladder. In women with fibroids, a hysterectomy may be recommended.  For total incontinence, surgery on your urinary sphincter may help. An  artificial urinary sphincter (an inflatable cuff placed around the urethra) may be required. In women who have developed a hole-like passage between their bladder and vagina (vesicovaginal fistula), surgery to close the fistula often is required. Follow these instructions at home:  Normal daily hygiene and the use of pads or adult diapers that are changed regularly will help prevent odors and skin damage.  Avoid caffeine. It can overstimulate your bladder.  Use the bathroom regularly. Try about every 2-3 hours to go to the bathroom, even if you do not feel the need to do so. Take time to empty your bladder completely. After urinating, wait a minute. Then try to urinate again.  For causes involving nerve dysfunction, keep a log of the medicines you take and a journal of the times you go to the bathroom. Contact a health care provider if:  You experience worsening of pain instead of improvement in pain after your procedure.  Your incontinence becomes worse instead of better. Get help right away if:  You experience fever or shaking chills.  You are unable to pass your urine.  You have redness spreading into your groin or down into your thighs. This information is not intended to replace advice given to you by your health care provider. Make sure you discuss any questions you have with your health care provider. Document Released: 07/19/2004 Document Revised: 01/20/2016 Document Reviewed: 11/18/2012 Elsevier Interactive Patient Education  2017 Elsevier Inc.   -  Sleep Apnea Sleep apnea is a condition that affects breathing. People with sleep apnea have moments during sleep when their breathing pauses briefly or gets shallow. Sleep apnea can cause these symptoms:  Trouble staying asleep.  Sleepiness or tiredness during the day.  Irritability.  Loud snoring.  Morning headaches.  Trouble concentrating.  Forgetting things.  Less interest in sex.  Being sleepy for no  reason.  Mood swings.  Personality changes.  Depression.  Waking up a lot during the night to pee (urinate).  Dry mouth.  Sore throat. Follow these instructions at home:  Make any changes in your routine that your doctor recommends.  Eat a healthy, well-balanced diet.  Take over-the-counter and prescription medicines only as told by your doctor.  Avoid using alcohol, calming medicines (sedatives), and narcotic medicines.  Take steps to lose weight if you are overweight.  If you were given a machine (device) to use while you sleep, use it only as told by your doctor.  Do not use any tobacco products, such as cigarettes, chewing tobacco, and e-cigarettes. If you need help quitting, ask your doctor.  Keep all follow-up visits as told by your doctor. This is important. Contact a doctor if:  The machine that you were given to use during sleep is uncomfortable or does not seem to be working.  Your symptoms do not  get better.  Your symptoms get worse. Get help right away if:  Your chest hurts.  You have trouble breathing in enough air (shortness of breath).  You have an uncomfortable feeling in your back, arms, or stomach.  You have trouble talking.  One side of your body feels weak.  A part of your face is hanging down (drooping). These symptoms may be an emergency. Do not wait to see if the symptoms will go away. Get medical help right away. Call your local emergency services (911 in the [place] Do not drive yourself to the hospital. This information is not intended to replace advice given to you by your health care provider. Make sure you discuss any questions you have with your health care provider. Document Released: 03/20/2008 Document Revised: 02/05/2016 Document Reviewed: 03/21/2015 Elsevier Interactive Patient Education  2017 [employer].

## 2016-11-13 NOTE — Progress Notes (Signed)
Rachel Woodard, is a 66 y.o. female  [id number]  [id number]  DOB - 23-Oct-1950  Chief Complaint  Patient presents with  . Diabetes        Subjective:   Rachel Woodard is a 66 y.o. female here today for a follow up visit, last seen 08/08/16,  Pmhx signif for mdd/anxiety, substance abuse (crack cocaine) in remission x 69months now, recently moved to new rehab house, htn, gerd, fibromyalgia.   Per pt, she is currently going to [facility] for her hx of substance abuse.  She notes walking more and trying to get healthier. She has lost about 16lbs since I last saw.    Taking all her meds, no problems.  She notes she saw psychiatry, Dr Edd Arbour ?spell, denies si/hi/avh. Has been on effexor xr 225 mg long time and it is helping her.  Co of urge incontinence as well, frequent urination. Prior vag deliveries.  Patient has No headache, No chest pain, No abdominal pain - No Nausea, No new weakness tingling or numbness, No Cough - SOB.  Problem  Htn (Hypertension), Benign  Urge Incontinence    ALLERGIES: Allergies  Allergen Reactions  . Lisinopril     PAST MEDICAL HISTORY: Past Medical History:  Diagnosis Date  . Anxiety   . Breast cancer ([place])   . Depression   . Hypertension   . PTSD (post-traumatic stress disorder)     MEDICATIONS AT HOME: Prior to Admission medications   Medication Sig Start Date End Date Taking? Authorizing Provider  amLODipine (NORVASC) 10 MG tablet Take 1 tablet (10 mg total) by mouth daily. 11/13/16   Maren Reamer, MD  carvedilol (COREG) 3.125 MG tablet Take 1 tablet (3.125 mg total) by mouth 2 (two) times daily with a meal. 11/13/16   Sabella Traore T, MD  estradiol (ESTRACE VAGINAL) 0.1 MG/GM vaginal cream Place 1 Applicatorful vaginally at bedtime. 08/30/16   Maren Reamer, MD  gabapentin (NEURONTIN) 300 MG capsule Take 300 mg by mouth daily. 11/02/15   [provider]  hydrochlorothiazide (HYDRODIURIL) 12.5 MG tablet Take 1  tablet (12.5 mg total) by mouth daily. 11/13/16   Maren Reamer, MD  metFORMIN (GLUCOPHAGE) 500 MG tablet Take 1 tablet (500 mg total) by mouth daily with breakfast. 08/30/16   Lottie Mussel T, MD  oxybutynin (DITROPAN-XL) 5 MG 24 hr tablet Take 1 tablet (5 mg total) by mouth at bedtime. 11/13/16   Maren Reamer, MD  ranitidine (ZANTAC) 150 MG tablet Take 1 tablet (150 mg total) by mouth 2 (two) times daily. 11/13/16   Maren Reamer, MD  silver sulfADIAZINE (SILVADENE) 1 % cream Apply 1 application topically daily. Patient not taking: Reported on 06/14/2016 04/12/16   Janne Napoleon, NP  sulfamethoxazole-trimethoprim (BACTRIM DS,SEPTRA DS) 800-160 MG tablet Take 1 tablet by mouth 2 (two) times daily. Patient not taking: Reported on 06/14/2016 04/08/16   Melanee Left, NP  valACYclovir (VALTREX) 500 MG tablet Take 500 mg by mouth 2 (two) times daily.    [provider]  venlafaxine XR (EFFEXOR XR) 75 MG 24 hr capsule Take 1 capsule (75 mg total) by mouth daily with breakfast. To make a total of 225 mg (take with 150 mg) 11/13/16   Maren Reamer, MD  venlafaxine XR (EFFEXOR-XR) 150 MG 24 hr capsule Take 1 capsule (150 mg total) by mouth daily with breakfast. + 75mg  = 225 qd 11/13/16   Maren Reamer, MD     Objective:   Vitals:  11/13/16 0936  BP: 122/82  Pulse: 80  Resp: 16  Temp: 98.3 F (36.8 C)  TempSrc: Oral  SpO2: 95%  Weight: 224 lb 3.2 oz (101.7 kg)   Weight 08/08/16 240lbs  Exam General appearance : Awake, alert, not in any distress. Speech Clear. Not toxic looking, pleasant. HEENT: Atraumatic and Normocephalic, pupils equally reactive to light. Neck: supple, no JVD. No cervical lymphadenopathy.  Chest:Good air entry bilaterally, no added sounds. CVS: S1 S2 regular, no murmurs/gallups or rubs. Abdomen: Bowel sounds active, Non tender and not distended with no gaurding, rigidity or rebound. Extremities: B/L Lower Ext shows no edema, both legs are  warm to touch Neurology: Awake alert, and oriented X 3, CN II-XII grossly intact, Non focal Skin:No Rash  Data Review Lab Results  Component Value Date   HGBA1C 5.7 08/08/2016    Depression screen [facility] 2/9 11/13/2016 08/08/2016  Decreased Interest 0 0  Down, Depressed, Hopeless 0 0  PHQ - 2 Score 0 0      Assessment & Plan   1. Urge incontinence Trial ditropan 5mg  qd, kegal exercises discussed and recd.  2. HTN (hypertension), benign Well controlled, same regimen, low salt diet encouraged  3. Vitamin D deficiency - she did not pick up the rx I ordered in Feb, but she was taking 5,000mg  pills 10 at time daily for time to increase.  Now taking it qod, will rechk levels - VITAMIN D 25 Hydroxy (Vit-D Deficiency, Fractures) - encouraged calcium 1200mg  /daily  4. Prediabetes - POCT glucose (manual entry) - tol metformin, continued  5. Needs MM f/u - encouarged pt to get the records she needs from [facility], [place] already In chart - pt [facility] ph[facility]  6. Colonoscopy - per pt, her records state next time due is 2022 - asked her to bring in records so we can scan     Patient have been counseled extensively about nutrition and exercise  Return in about 3 months (around 02/13/2017), or if symptoms worsen or fail to improve.  The patient was given clear instructions to go to ER or return to medical center if symptoms don't improve, worsen or new problems develop. The patient verbalized understanding. The patient was told to call to get lab results if they haven't heard anything in the next week.   This note has been created with Surveyor, quantity. Any transcriptional errors are unintentional.   Maren Reamer, MD, [facility] [place], [place]   11/13/2016, 11:19 AM

## 2016-11-14 LAB — VITAMIN D 25 HYDROXY (VIT D DEFICIENCY, FRACTURES): VIT D 25 HYDROXY: 29.1 ng/mL — AB (ref 30.0–100.0)

## 2016-11-20 DIAGNOSIS — F1420 Cocaine dependence, uncomplicated: Secondary | ICD-10-CM | POA: Diagnosis not present

## 2016-11-20 DIAGNOSIS — F1020 Alcohol dependence, uncomplicated: Secondary | ICD-10-CM | POA: Diagnosis not present

## 2016-11-23 DIAGNOSIS — F1020 Alcohol dependence, uncomplicated: Secondary | ICD-10-CM | POA: Diagnosis not present

## 2016-11-23 DIAGNOSIS — F1420 Cocaine dependence, uncomplicated: Secondary | ICD-10-CM | POA: Diagnosis not present

## 2016-11-27 DIAGNOSIS — F1020 Alcohol dependence, uncomplicated: Secondary | ICD-10-CM | POA: Diagnosis not present

## 2016-11-27 DIAGNOSIS — F1420 Cocaine dependence, uncomplicated: Secondary | ICD-10-CM | POA: Diagnosis not present

## 2016-11-29 ENCOUNTER — Other Ambulatory Visit: Payer: Self-pay | Admitting: Pharmacist

## 2016-11-29 DIAGNOSIS — F1420 Cocaine dependence, uncomplicated: Secondary | ICD-10-CM | POA: Diagnosis not present

## 2016-11-29 DIAGNOSIS — F1020 Alcohol dependence, uncomplicated: Secondary | ICD-10-CM | POA: Diagnosis not present

## 2016-11-29 MED ORDER — VENLAFAXINE HCL ER 150 MG PO CP24: 150.0000 mg | ORAL_CAPSULE | Freq: Every day | ORAL | 3 refills | Status: DC

## 2016-11-29 MED ORDER — VENLAFAXINE HCL ER 75 MG PO CP24: 75.0000 mg | ORAL_CAPSULE | Freq: Every day | ORAL | 3 refills | Status: DC

## 2016-12-03 DIAGNOSIS — F1420 Cocaine dependence, uncomplicated: Secondary | ICD-10-CM | POA: Diagnosis not present

## 2016-12-03 DIAGNOSIS — F1020 Alcohol dependence, uncomplicated: Secondary | ICD-10-CM | POA: Diagnosis not present

## 2016-12-12 ENCOUNTER — Other Ambulatory Visit: Payer: Self-pay | Admitting: Pharmacist

## 2016-12-12 MED ORDER — GABAPENTIN 300 MG PO CAPS: 300.0000 mg | ORAL_CAPSULE | Freq: Every day | ORAL | 0 refills | Status: DC

## 2017-01-10 ENCOUNTER — Ambulatory Visit: Payer: Medicare HMO | Admitting: Internal Medicine

## 2017-01-12 ENCOUNTER — Other Ambulatory Visit: Payer: Self-pay | Admitting: Internal Medicine

## 2017-01-12 MED ORDER — GABAPENTIN 300 MG PO CAPS: 300.0000 mg | ORAL_CAPSULE | Freq: Every day | ORAL | 0 refills | Status: DC

## 2017-01-14 ENCOUNTER — Ambulatory Visit: Payer: Medicare HMO

## 2017-01-18 DIAGNOSIS — F1020 Alcohol dependence, uncomplicated: Secondary | ICD-10-CM | POA: Diagnosis not present

## 2017-01-18 DIAGNOSIS — F1420 Cocaine dependence, uncomplicated: Secondary | ICD-10-CM | POA: Diagnosis not present

## 2017-01-21 DIAGNOSIS — F1420 Cocaine dependence, uncomplicated: Secondary | ICD-10-CM | POA: Diagnosis not present

## 2017-01-21 DIAGNOSIS — F1020 Alcohol dependence, uncomplicated: Secondary | ICD-10-CM | POA: Diagnosis not present

## 2017-01-23 DIAGNOSIS — F1420 Cocaine dependence, uncomplicated: Secondary | ICD-10-CM | POA: Diagnosis not present

## 2017-01-23 DIAGNOSIS — F1020 Alcohol dependence, uncomplicated: Secondary | ICD-10-CM | POA: Diagnosis not present

## 2017-01-28 ENCOUNTER — Ambulatory Visit (INDEPENDENT_AMBULATORY_CARE_PROVIDER_SITE_OTHER): Payer: Medicare HMO | Admitting: Psychiatry

## 2017-01-28 ENCOUNTER — Encounter (HOSPITAL_COMMUNITY): Payer: Self-pay | Admitting: Psychiatry

## 2017-01-28 VITALS — BP 132/74 | HR 73 | Ht 66.0 in | Wt 224.2 lb

## 2017-01-28 DIAGNOSIS — F1099 Alcohol use, unspecified with unspecified alcohol-induced disorder: Secondary | ICD-10-CM

## 2017-01-28 DIAGNOSIS — F1411 Cocaine abuse, in remission: Secondary | ICD-10-CM | POA: Diagnosis not present

## 2017-01-28 DIAGNOSIS — M549 Dorsalgia, unspecified: Secondary | ICD-10-CM

## 2017-01-28 DIAGNOSIS — F17210 Nicotine dependence, cigarettes, uncomplicated: Secondary | ICD-10-CM | POA: Diagnosis not present

## 2017-01-28 DIAGNOSIS — F330 Major depressive disorder, recurrent, mild: Secondary | ICD-10-CM

## 2017-01-28 DIAGNOSIS — Z598 Other problems related to housing and economic circumstances: Secondary | ICD-10-CM

## 2017-01-28 DIAGNOSIS — M2550 Pain in unspecified joint: Secondary | ICD-10-CM

## 2017-01-28 MED ORDER — VENLAFAXINE HCL ER 150 MG PO CP24: 150.0000 mg | ORAL_CAPSULE | Freq: Every day | ORAL | 0 refills | Status: DC

## 2017-01-28 MED ORDER — VENLAFAXINE HCL ER 75 MG PO CP24: 75.0000 mg | ORAL_CAPSULE | Freq: Every day | ORAL | 0 refills | Status: DC
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
## 2017-01-30 DIAGNOSIS — F1020 Alcohol dependence, uncomplicated: Secondary | ICD-10-CM | POA: Diagnosis not present

## 2017-01-30 DIAGNOSIS — F1420 Cocaine dependence, uncomplicated: Secondary | ICD-10-CM | POA: Diagnosis not present

## 2017-02-01 DIAGNOSIS — F1420 Cocaine dependence, uncomplicated: Secondary | ICD-10-CM | POA: Diagnosis not present

## 2017-02-01 DIAGNOSIS — F1020 Alcohol dependence, uncomplicated: Secondary | ICD-10-CM | POA: Diagnosis not present

## 2017-02-01 DIAGNOSIS — H524 Presbyopia: Secondary | ICD-10-CM | POA: Diagnosis not present

## 2017-02-04 DIAGNOSIS — F1020 Alcohol dependence, uncomplicated: Secondary | ICD-10-CM | POA: Diagnosis not present

## 2017-02-04 DIAGNOSIS — F1420 Cocaine dependence, uncomplicated: Secondary | ICD-10-CM | POA: Diagnosis not present

## 2017-02-06 DIAGNOSIS — F1020 Alcohol dependence, uncomplicated: Secondary | ICD-10-CM | POA: Diagnosis not present

## 2017-02-06 DIAGNOSIS — F1420 Cocaine dependence, uncomplicated: Secondary | ICD-10-CM | POA: Diagnosis not present

## 2017-02-11 DIAGNOSIS — F1420 Cocaine dependence, uncomplicated: Secondary | ICD-10-CM | POA: Diagnosis not present

## 2017-02-11 DIAGNOSIS — F1020 Alcohol dependence, uncomplicated: Secondary | ICD-10-CM | POA: Diagnosis not present

## 2017-02-14 ENCOUNTER — Encounter: Payer: Self-pay | Admitting: Internal Medicine

## 2017-02-14 ENCOUNTER — Ambulatory Visit: Payer: Medicare HMO | Attending: Internal Medicine | Admitting: Internal Medicine

## 2017-02-14 VITALS — BP 109/75 | HR 85 | Temp 98.6°F | Resp 16 | Wt 226.2 lb

## 2017-02-14 DIAGNOSIS — Z7984 Long term (current) use of oral hypoglycemic drugs: Secondary | ICD-10-CM | POA: Insufficient documentation

## 2017-02-14 DIAGNOSIS — F329 Major depressive disorder, single episode, unspecified: Secondary | ICD-10-CM | POA: Diagnosis not present

## 2017-02-14 DIAGNOSIS — Z1231 Encounter for screening mammogram for malignant neoplasm of breast: Secondary | ICD-10-CM

## 2017-02-14 DIAGNOSIS — F1411 Cocaine abuse, in remission: Secondary | ICD-10-CM | POA: Diagnosis not present

## 2017-02-14 DIAGNOSIS — Z79899 Other long term (current) drug therapy: Secondary | ICD-10-CM | POA: Insufficient documentation

## 2017-02-14 DIAGNOSIS — N3281 Overactive bladder: Secondary | ICD-10-CM | POA: Insufficient documentation

## 2017-02-14 DIAGNOSIS — Z1239 Encounter for other screening for malignant neoplasm of breast: Secondary | ICD-10-CM

## 2017-02-14 DIAGNOSIS — Z853 Personal history of malignant neoplasm of breast: Secondary | ICD-10-CM | POA: Insufficient documentation

## 2017-02-14 DIAGNOSIS — N3941 Urge incontinence: Secondary | ICD-10-CM | POA: Insufficient documentation

## 2017-02-14 DIAGNOSIS — F419 Anxiety disorder, unspecified: Secondary | ICD-10-CM | POA: Diagnosis not present

## 2017-02-14 DIAGNOSIS — Z888 Allergy status to other drugs, medicaments and biological substances status: Secondary | ICD-10-CM | POA: Insufficient documentation

## 2017-02-14 DIAGNOSIS — F17210 Nicotine dependence, cigarettes, uncomplicated: Secondary | ICD-10-CM | POA: Diagnosis not present

## 2017-02-14 DIAGNOSIS — I10 Essential (primary) hypertension: Secondary | ICD-10-CM | POA: Insufficient documentation

## 2017-02-14 DIAGNOSIS — E559 Vitamin D deficiency, unspecified: Secondary | ICD-10-CM | POA: Insufficient documentation

## 2017-02-14 DIAGNOSIS — F1020 Alcohol dependence, uncomplicated: Secondary | ICD-10-CM | POA: Diagnosis not present

## 2017-02-14 DIAGNOSIS — Z9889 Other specified postprocedural states: Secondary | ICD-10-CM | POA: Diagnosis not present

## 2017-02-14 DIAGNOSIS — M797 Fibromyalgia: Secondary | ICD-10-CM | POA: Insufficient documentation

## 2017-02-14 DIAGNOSIS — F1911 Other psychoactive substance abuse, in remission: Secondary | ICD-10-CM | POA: Diagnosis not present

## 2017-02-14 DIAGNOSIS — Z9884 Bariatric surgery status: Secondary | ICD-10-CM | POA: Insufficient documentation

## 2017-02-14 DIAGNOSIS — F1420 Cocaine dependence, uncomplicated: Secondary | ICD-10-CM | POA: Diagnosis not present

## 2017-02-14 DIAGNOSIS — K219 Gastro-esophageal reflux disease without esophagitis: Secondary | ICD-10-CM | POA: Diagnosis not present

## 2017-02-14 DIAGNOSIS — R7303 Prediabetes: Secondary | ICD-10-CM | POA: Diagnosis not present

## 2017-02-14 DIAGNOSIS — F32A Depression, unspecified: Secondary | ICD-10-CM

## 2017-02-14 LAB — GLUCOSE, POCT (MANUAL RESULT ENTRY): POC Glucose: 125 mg/dl — AB (ref 70–99)

## 2017-02-14 LAB — POCT GLYCOSYLATED HEMOGLOBIN (HGB A1C): Hemoglobin A1C: 5.5

## 2017-02-14 MED ORDER — GABAPENTIN 300 MG PO CAPS: 300.0000 mg | ORAL_CAPSULE | Freq: Every day | ORAL | 6 refills | Status: DC

## 2017-02-14 NOTE — Progress Notes (Signed)
Patient ID: Christabell Loseke, female    DOB: [birth year]  MRN: [id number]  CC: re-establish and Diabetes   Subjective: Rachel Woodard is a 66 y.o. female who presents for Her concerns today include:  Pmhx signif for mdd/anxiety, substance abuse (crack cocaine) in remission, htn, gerd, fibromyalgia, urge incont, Vit D def.  Breast CA RT 2013 ([place] with lumpectomy and XRT)  1. Substance Abuse:  Had a relapse in June -went to [facility] in [place] for 38 days -abstinent  for 60+ days  2. MDD/Anxiety: seeing Dr. Adele Schilder. On Effexor and doing well  2. HTN: Compliant with Norvasc, Coreg and HCTZ  3. Over active bladder -stopped Ditropan because swelling in feet and inc BP Don't want to try anything else right now  4. Pre-DM Tolerating Metformin Trying to eat healthier  5. Fibromyalgia -request RF on Gabapentin. Stable right now  6. Vit D 5000 IU OTC -would like to recheck level  HM: had colonoscopy in [place]; normal with no polyps and was told next one is due in 2022.  Patient Active Problem List   Diagnosis Date Noted  . Pre-diabetes 02/14/2017  . HTN (hypertension), benign 11/13/2016  . Urge incontinence 11/13/2016  . Diabetes mellitus screening 08/08/2016  . Unilateral primary osteoarthritis, left knee 06/14/2016     Current Outpatient Prescriptions on File Prior to Visit  Medication Sig Dispense Refill  . amLODipine (NORVASC) 10 MG tablet Take 1 tablet (10 mg total) by mouth daily. 90 tablet 3  . carvedilol (COREG) 3.125 MG tablet Take 1 tablet (3.125 mg total) by mouth 2 (two) times daily with a meal. 180 tablet 3  . estradiol (ESTRACE VAGINAL) 0.1 MG/GM vaginal cream Place 1 Applicatorful vaginally at bedtime. (Patient not taking: Reported on 01/28/2017) 42.5 g 0  . gabapentin (NEURONTIN) 300 MG capsule Take 1 capsule (300 mg total) by mouth daily. 30 capsule 0  . hydrochlorothiazide (HYDRODIURIL) 12.5 MG tablet Take 1 tablet (12.5 mg  total) by mouth daily. 90 tablet 3  . metFORMIN (GLUCOPHAGE) 500 MG tablet Take 1 tablet (500 mg total) by mouth daily with breakfast. 90 tablet 3  . oxybutynin (DITROPAN-XL) 5 MG 24 hr tablet Take 1 tablet (5 mg total) by mouth at bedtime. 30 tablet 2  . ranitidine (ZANTAC) 150 MG tablet Take 1 tablet (150 mg total) by mouth 2 (two) times daily. 180 tablet 3  . silver sulfADIAZINE (SILVADENE) 1 % cream Apply 1 application topically daily. (Patient not taking: Reported on 06/14/2016) 50 g 0  . sulfamethoxazole-trimethoprim (BACTRIM DS,SEPTRA DS) 800-160 MG tablet Take 1 tablet by mouth 2 (two) times daily. (Patient not taking: Reported on 06/14/2016) 10 tablet 0  . valACYclovir (VALTREX) 500 MG tablet Take 500 mg by mouth 2 (two) times daily.    Marland Kitchen venlafaxine XR (EFFEXOR XR) 75 MG 24 hr capsule Take 1 capsule (75 mg total) by mouth daily with breakfast. To make a total of 225 mg (take with 150 mg) 90 capsule 0  . venlafaxine XR (EFFEXOR-XR) 150 MG 24 hr capsule Take 1 capsule (150 mg total) by mouth daily with breakfast. + 75mg  = 225 qd 90 capsule 0   No current facility-administered medications on file prior to visit.     Allergies  Allergen Reactions  . Lisinopril     Social History   Social History  . Marital status: Single    Spouse name: N/A  . Number of children: N/A  . Years of education: N/A  Occupational History  . Not on file.   Social History Main Topics  . Smoking status: Current Every Day Smoker    Packs/day: 0.50    Types: Cigarettes  . Smokeless tobacco: Never Used  . Alcohol use Yes     Comment: seldom  . Drug use: No     Comment: clean x 60 days on 11/28/15  . Sexual activity: Yes   Other Topics Concern  . Not on file   Social History Narrative  . No narrative on file    No family history on file.  Past Surgical History:  Procedure Laterality Date  . ABDOMINAL HYSTERECTOMY    . GASTRIC BYPASS  2001    ROS: Review of Systems Neg except as stated  above  PHYSICAL EXAM: BP 109/75   Pulse 85   Temp 98.6 F (37 C) (Oral)   Resp 16   Wt 226 lb 3.2 oz (102.6 kg)   SpO2 98%   BMI 36.51 kg/m   Physical Exam General appearance - alert, well appearing, and in no distress Mental status - alert, oriented to person, place, and time Mouth - mucous membranes moist, pharynx normal without lesions Neck - supple, no significant adenopathy Chest - clear to auscultation, no wheezes, rales or rhonchi, symmetric air entry Heart - normal rate, regular rhythm, normal S1, S2, no murmurs, rubs, clicks or gallops Extremities - peripheral pulses normal, no pedal edema, no clubbing or cyanosis   Depression screen [facility] 2/9 02/14/2017  Decreased Interest 0  Down, Depressed, Hopeless 0  PHQ - 2 Score 0    Results for orders placed or performed in visit on 02/14/17  POCT glucose (manual entry)  Result Value Ref Range   POC Glucose 125 (A) 70 - 99 mg/dl    ASSESSMENT AND PLAN: 1. HTN (hypertension), benign -at goal Cont med and DASH 2. Pre-diabetes -cont Metformin. Dietary counseling given - POCT glucose (manual entry) - POCT glycosylated hemoglobin (Hb A1C)  3. Fibromyalgia - gabapentin (NEURONTIN) 300 MG capsule; Take 1 capsule (300 mg total) by mouth daily.  Dispense: 30 capsule; Refill: 6  4. Urge incontinence -declined trial of other med like Vesicare  5. Substance abuse in remission In early remission, Encourage continue absyinence  6. Anxiety and depression -stable. Seeing psychiatrist and counselor  7. Breast cancer screening - MM Digital Screening; Future  8. Vitamin D deficiency -level to be checked   Patient was given the opportunity to ask questions.  Patient verbalized understanding of the plan and was able to repeat key elements of the plan.   Orders Placed This Encounter  Procedures  . POCT glucose (manual entry)  . POCT glycosylated hemoglobin (Hb A1C)     Requested Prescriptions    No prescriptions  requested or ordered in this encounter    No Follow-up on file.  Karle Plumber, MD, FACP

## 2017-02-18 DIAGNOSIS — F1420 Cocaine dependence, uncomplicated: Secondary | ICD-10-CM | POA: Diagnosis not present

## 2017-02-18 DIAGNOSIS — F1020 Alcohol dependence, uncomplicated: Secondary | ICD-10-CM | POA: Diagnosis not present

## 2017-02-20 DIAGNOSIS — F1020 Alcohol dependence, uncomplicated: Secondary | ICD-10-CM | POA: Diagnosis not present

## 2017-02-20 DIAGNOSIS — F1420 Cocaine dependence, uncomplicated: Secondary | ICD-10-CM | POA: Diagnosis not present

## 2017-02-21 ENCOUNTER — Ambulatory Visit (INDEPENDENT_AMBULATORY_CARE_PROVIDER_SITE_OTHER): Payer: Medicare HMO | Admitting: Licensed Clinical Social Worker

## 2017-02-21 ENCOUNTER — Encounter (HOSPITAL_COMMUNITY): Payer: Self-pay | Admitting: Licensed Clinical Social Worker

## 2017-02-21 ENCOUNTER — Telehealth: Payer: Self-pay | Admitting: Internal Medicine

## 2017-02-21 DIAGNOSIS — F330 Major depressive disorder, recurrent, mild: Secondary | ICD-10-CM | POA: Diagnosis not present

## 2017-02-21 DIAGNOSIS — F1421 Cocaine dependence, in remission: Secondary | ICD-10-CM | POA: Diagnosis not present

## 2017-02-21 DIAGNOSIS — M797 Fibromyalgia: Secondary | ICD-10-CM
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
## 2017-02-21 NOTE — Telephone Encounter (Signed)
[facility] called requesting status of medication refill for gabapentin (NEURONTIN) 300 MG capsule, they state they have received rx. Please f/up

## 2017-02-22 MED ORDER — GABAPENTIN 300 MG PO CAPS: 300.0000 mg | ORAL_CAPSULE | Freq: Every day | ORAL | 6 refills | Status: DC

## 2017-02-22 NOTE — Telephone Encounter (Signed)
Prescription resent to [facility]

## 2017-02-27 DIAGNOSIS — F1020 Alcohol dependence, uncomplicated: Secondary | ICD-10-CM | POA: Diagnosis not present

## 2017-02-27 DIAGNOSIS — F1420 Cocaine dependence, uncomplicated: Secondary | ICD-10-CM | POA: Diagnosis not present

## 2017-03-01 ENCOUNTER — Other Ambulatory Visit: Payer: Self-pay | Admitting: Internal Medicine

## 2017-03-01 DIAGNOSIS — Z853 Personal history of malignant neoplasm of breast: Secondary | ICD-10-CM

## 2017-03-01 DIAGNOSIS — F1420 Cocaine dependence, uncomplicated: Secondary | ICD-10-CM | POA: Diagnosis not present

## 2017-03-01 DIAGNOSIS — F1020 Alcohol dependence, uncomplicated: Secondary | ICD-10-CM | POA: Diagnosis not present

## 2017-03-04 DIAGNOSIS — F1020 Alcohol dependence, uncomplicated: Secondary | ICD-10-CM | POA: Diagnosis not present

## 2017-03-04 DIAGNOSIS — F1420 Cocaine dependence, uncomplicated: Secondary | ICD-10-CM | POA: Diagnosis not present

## 2017-03-06 DIAGNOSIS — F1420 Cocaine dependence, uncomplicated: Secondary | ICD-10-CM | POA: Diagnosis not present

## 2017-03-06 DIAGNOSIS — F1020 Alcohol dependence, uncomplicated: Secondary | ICD-10-CM | POA: Diagnosis not present

## 2017-03-08 ENCOUNTER — Ambulatory Visit
Admission: RE | Admit: 2017-03-08 | Discharge: 2017-03-08 | Disposition: A | Discharge status: Home / Self Care | Payer: Medicare HMO | Source: Ambulatory Visit | Attending: Family Medicine | Admitting: Family Medicine

## 2017-03-08 DIAGNOSIS — R928 Other abnormal and inconclusive findings on diagnostic imaging of breast: Secondary | ICD-10-CM | POA: Diagnosis not present

## 2017-03-08 DIAGNOSIS — Z853 Personal history of malignant neoplasm of breast: Secondary | ICD-10-CM

## 2017-03-11 DIAGNOSIS — F1420 Cocaine dependence, uncomplicated: Secondary | ICD-10-CM | POA: Diagnosis not present

## 2017-03-11 DIAGNOSIS — F1020 Alcohol dependence, uncomplicated: Secondary | ICD-10-CM | POA: Diagnosis not present

## 2017-03-12 ENCOUNTER — Ambulatory Visit (HOSPITAL_COMMUNITY): Payer: Medicare HMO | Admitting: Psychiatry

## 2017-03-13 DIAGNOSIS — F1020 Alcohol dependence, uncomplicated: Secondary | ICD-10-CM | POA: Diagnosis not present

## 2017-03-13 DIAGNOSIS — F1420 Cocaine dependence, uncomplicated: Secondary | ICD-10-CM | POA: Diagnosis not present

## 2017-03-18 DIAGNOSIS — F1420 Cocaine dependence, uncomplicated: Secondary | ICD-10-CM | POA: Diagnosis not present

## 2017-03-18 DIAGNOSIS — F1020 Alcohol dependence, uncomplicated: Secondary | ICD-10-CM | POA: Diagnosis not present

## 2017-03-20 DIAGNOSIS — F1420 Cocaine dependence, uncomplicated: Secondary | ICD-10-CM | POA: Diagnosis not present

## 2017-03-20 DIAGNOSIS — F1020 Alcohol dependence, uncomplicated: Secondary | ICD-10-CM | POA: Diagnosis not present

## 2017-03-25 DIAGNOSIS — F1420 Cocaine dependence, uncomplicated: Secondary | ICD-10-CM | POA: Diagnosis not present

## 2017-03-25 DIAGNOSIS — F1020 Alcohol dependence, uncomplicated: Secondary | ICD-10-CM | POA: Diagnosis not present

## 2017-03-27 DIAGNOSIS — F1420 Cocaine dependence, uncomplicated: Secondary | ICD-10-CM | POA: Diagnosis not present

## 2017-03-27 DIAGNOSIS — F1020 Alcohol dependence, uncomplicated: Secondary | ICD-10-CM | POA: Diagnosis not present

## 2017-03-28 ENCOUNTER — Ambulatory Visit (INDEPENDENT_AMBULATORY_CARE_PROVIDER_SITE_OTHER): Payer: Medicare HMO | Admitting: Licensed Clinical Social Worker

## 2017-03-28 ENCOUNTER — Encounter (HOSPITAL_COMMUNITY): Payer: Self-pay | Admitting: Licensed Clinical Social Worker

## 2017-03-28 DIAGNOSIS — F330 Major depressive disorder, recurrent, mild: Secondary | ICD-10-CM

## 2017-03-28 NOTE — Progress Notes (Signed)
Patient ID: Rachel Woodard, female   DOB: 17-Jan-1951, 66 y.o.   MRN: [id number]  Pt came in for her individual counseling session today. Spoke with pt about she would be transferred to another therapist due to insurance constraints. Pt was disappointed in the transfer and requested information for other therapists in the community. Pt was given a list. Pt gave an update on her depressive symptoms which she acknowledges have leveled off. She feels her medications are working well. On a depression scale 0-10, with 10 being the most depressed pt scored a 3.. Pt is still living in sober housing at [facility] and goes to IOP there. Pt presented stable and less depressed today.  Jenkins Rouge, LCAS 15 minutes

## 2017-04-01 ENCOUNTER — Telehealth: Payer: Self-pay | Admitting: Internal Medicine

## 2017-04-01 DIAGNOSIS — F1420 Cocaine dependence, uncomplicated: Secondary | ICD-10-CM | POA: Diagnosis not present

## 2017-04-01 DIAGNOSIS — F1020 Alcohol dependence, uncomplicated: Secondary | ICD-10-CM | POA: Diagnosis not present

## 2017-04-01 DIAGNOSIS — F330 Major depressive disorder, recurrent, mild: Secondary | ICD-10-CM

## 2017-04-01 MED ORDER — VENLAFAXINE HCL ER 150 MG PO CP24: 150.0000 mg | ORAL_CAPSULE | Freq: Every day | ORAL | 0 refills | Status: DC

## 2017-04-01 MED ORDER — VENLAFAXINE HCL ER 75 MG PO CP24: 75.0000 mg | ORAL_CAPSULE | Freq: Every day | ORAL | 0 refills | Status: DC

## 2017-04-01 NOTE — Telephone Encounter (Signed)
Refilled

## 2017-04-01 NOTE — Telephone Encounter (Signed)
Pt called requesting medication refill on venlafaxine XR (EFFEXOR XR) 75 MG 24 hr capsule, venlafaxine XR (EFFEXOR-XR) 150 MG 24 hr capsule. Pt states she is completely out of medication and would like medication sent to [facility]. Please f/up

## 2017-04-08 DIAGNOSIS — F1420 Cocaine dependence, uncomplicated: Secondary | ICD-10-CM | POA: Diagnosis not present

## 2017-04-08 DIAGNOSIS — F1020 Alcohol dependence, uncomplicated: Secondary | ICD-10-CM | POA: Diagnosis not present

## 2017-04-12 DIAGNOSIS — F1020 Alcohol dependence, uncomplicated: Secondary | ICD-10-CM | POA: Diagnosis not present

## 2017-04-12 DIAGNOSIS — F1420 Cocaine dependence, uncomplicated: Secondary | ICD-10-CM | POA: Diagnosis not present

## 2017-04-15 ENCOUNTER — Ambulatory Visit (INDEPENDENT_AMBULATORY_CARE_PROVIDER_SITE_OTHER): Payer: Medicare HMO | Admitting: Podiatry

## 2017-04-15 VITALS — BP 128/74 | HR 84 | Ht 66.0 in | Wt 220.0 lb

## 2017-04-15 DIAGNOSIS — L600 Ingrowing nail: Secondary | ICD-10-CM

## 2017-04-15 DIAGNOSIS — L608 Other nail disorders: Secondary | ICD-10-CM | POA: Diagnosis not present

## 2017-04-15 DIAGNOSIS — L603 Nail dystrophy: Secondary | ICD-10-CM | POA: Diagnosis not present

## 2017-04-15 DIAGNOSIS — F1420 Cocaine dependence, uncomplicated: Secondary | ICD-10-CM | POA: Diagnosis not present

## 2017-04-15 DIAGNOSIS — F1020 Alcohol dependence, uncomplicated: Secondary | ICD-10-CM | POA: Diagnosis not present

## 2017-04-15 NOTE — Patient Instructions (Signed)

## 2017-04-15 NOTE — Progress Notes (Signed)
   Subjective:    Patient ID: Rachel Woodard, female    DOB: [birth year], 66 y.o.   MRN: [id number]  HPI  Chief Complaint  Patient presents with  . Nail Problem    bilateral toenail fungus  . Ingrown Toenail    great toe right      Review of Systems     Objective:   Physical Exam        Assessment & Plan:

## 2017-04-17 DIAGNOSIS — F1020 Alcohol dependence, uncomplicated: Secondary | ICD-10-CM | POA: Diagnosis not present

## 2017-04-17 DIAGNOSIS — F1420 Cocaine dependence, uncomplicated: Secondary | ICD-10-CM | POA: Diagnosis not present

## 2017-04-17 NOTE — Progress Notes (Signed)
   Subjective: Patient presents today for evaluation of pain to the lateral border of the right great toe that has been ongoing for the past several months. Patient is concerned for possible ingrown nail. She also complains of fungus to nails of bilateral feet. She reports associated thickening of the nails. Patient presents today for further treatment and evaluation.    Past Medical History:  Diagnosis Date  . Anxiety   . Arthritis   . Breast cancer ([place])   . Depression   . Fibromyalgia   . Hypertension   . PTSD (post-traumatic stress disorder)     Objective:  General: Well developed, nourished, in no acute distress, alert and oriented x3   Dermatology: Skin is warm, dry and supple bilateral. Lateral border of right great toe appears to be erythematous with evidence of an ingrowing nail. Pain on palpation noted to the border of the nail fold. The remaining nails appear unremarkable at this time. There are no open sores, lesions. Hyperkeratotic, discolored, thickened, onychodystrophy of nails noted bilaterally.   Vascular: Dorsalis Pedis artery and Posterior Tibial artery pedal pulses palpable. No lower extremity edema noted.   Neruologic: Grossly intact via light touch bilateral.  Musculoskeletal: Muscular strength within normal limits in all groups bilateral. Normal range of motion noted to all pedal and ankle joints.   Radiographic Exam:  Normal osseous mineralization. Joint spaces preserved. No fracture/dislocation/boney destruction.    Assesement: #1 Paronychia with ingrowing nail lateral border of right great toe #2 Pain in toe #3 Incurvated nail #4 onychodystrophy bilateral toenails  Plan of Care:  1. Patient evaluated.  2. Discussed treatment alternatives and plan of care. Explained nail avulsion procedure and post procedure course to patient. 3. Patient opted for permanent partial nail avulsion.  4. Prior to procedure, local anesthesia infiltration utilized using 3  ml of a 50:50 mixture of 2% plain lidocaine and 0.5% plain marcaine in a normal hallux block fashion and a betadine prep performed.  5. Partial permanent nail avulsion with chemical matrixectomy performed using [id number] applications of phenol followed by alcohol flush.  6. Light dressing applied. 7. Today nail biopsy was taken and sent to pathology for fungal culture. 8. Pt has taken oral Lamisil in the past without any improvement. 9. patient is to return to clinic in 3 weeks to discuss fungal culture nail biopsy findings and follow up for ingrown nail avulsion procedure.   Edrick Kins, DPM [facility]  Dr. Edrick Kins, [address]                                        [place], [place] [address]                Office [phone]  Fax [phone]

## 2017-04-19 DIAGNOSIS — F1020 Alcohol dependence, uncomplicated: Secondary | ICD-10-CM | POA: Diagnosis not present

## 2017-04-19 DIAGNOSIS — F1420 Cocaine dependence, uncomplicated: Secondary | ICD-10-CM | POA: Diagnosis not present

## 2017-04-22 DIAGNOSIS — F1420 Cocaine dependence, uncomplicated: Secondary | ICD-10-CM | POA: Diagnosis not present

## 2017-04-22 DIAGNOSIS — F1020 Alcohol dependence, uncomplicated: Secondary | ICD-10-CM | POA: Diagnosis not present

## 2017-04-24 DIAGNOSIS — F1420 Cocaine dependence, uncomplicated: Secondary | ICD-10-CM | POA: Diagnosis not present

## 2017-04-24 DIAGNOSIS — F1020 Alcohol dependence, uncomplicated: Secondary | ICD-10-CM | POA: Diagnosis not present

## 2017-04-26 DIAGNOSIS — F1420 Cocaine dependence, uncomplicated: Secondary | ICD-10-CM | POA: Diagnosis not present

## 2017-04-26 DIAGNOSIS — F1020 Alcohol dependence, uncomplicated: Secondary | ICD-10-CM | POA: Diagnosis not present

## 2017-04-29 DIAGNOSIS — F1020 Alcohol dependence, uncomplicated: Secondary | ICD-10-CM | POA: Diagnosis not present

## 2017-04-29 DIAGNOSIS — F1420 Cocaine dependence, uncomplicated: Secondary | ICD-10-CM | POA: Diagnosis not present

## 2017-05-01 DIAGNOSIS — F1420 Cocaine dependence, uncomplicated: Secondary | ICD-10-CM | POA: Diagnosis not present

## 2017-05-01 DIAGNOSIS — F1020 Alcohol dependence, uncomplicated: Secondary | ICD-10-CM | POA: Diagnosis not present

## 2017-05-03 DIAGNOSIS — F1020 Alcohol dependence, uncomplicated: Secondary | ICD-10-CM | POA: Diagnosis not present

## 2017-05-03 DIAGNOSIS — F1420 Cocaine dependence, uncomplicated: Secondary | ICD-10-CM | POA: Diagnosis not present

## 2017-05-06 ENCOUNTER — Ambulatory Visit: Payer: Medicare HMO | Admitting: Podiatry

## 2017-05-06 DIAGNOSIS — Z79899 Other long term (current) drug therapy: Secondary | ICD-10-CM | POA: Diagnosis not present

## 2017-05-06 DIAGNOSIS — F1020 Alcohol dependence, uncomplicated: Secondary | ICD-10-CM | POA: Diagnosis not present

## 2017-05-06 DIAGNOSIS — Z5181 Encounter for therapeutic drug level monitoring: Secondary | ICD-10-CM | POA: Diagnosis not present

## 2017-05-06 DIAGNOSIS — F1420 Cocaine dependence, uncomplicated: Secondary | ICD-10-CM | POA: Diagnosis not present

## 2017-05-08 DIAGNOSIS — Z79899 Other long term (current) drug therapy: Secondary | ICD-10-CM | POA: Diagnosis not present

## 2017-05-08 DIAGNOSIS — F1020 Alcohol dependence, uncomplicated: Secondary | ICD-10-CM | POA: Diagnosis not present

## 2017-05-08 DIAGNOSIS — F1420 Cocaine dependence, uncomplicated: Secondary | ICD-10-CM | POA: Diagnosis not present

## 2017-05-08 DIAGNOSIS — Z5181 Encounter for therapeutic drug level monitoring: Secondary | ICD-10-CM | POA: Diagnosis not present

## 2017-05-10 DIAGNOSIS — F1020 Alcohol dependence, uncomplicated: Secondary | ICD-10-CM | POA: Diagnosis not present

## 2017-05-10 DIAGNOSIS — F1420 Cocaine dependence, uncomplicated: Secondary | ICD-10-CM | POA: Diagnosis not present

## 2017-05-13 DIAGNOSIS — F1020 Alcohol dependence, uncomplicated: Secondary | ICD-10-CM | POA: Diagnosis not present

## 2017-05-13 DIAGNOSIS — Z5181 Encounter for therapeutic drug level monitoring: Secondary | ICD-10-CM | POA: Diagnosis not present

## 2017-05-13 DIAGNOSIS — F1420 Cocaine dependence, uncomplicated: Secondary | ICD-10-CM | POA: Diagnosis not present

## 2017-05-13 DIAGNOSIS — Z79899 Other long term (current) drug therapy: Secondary | ICD-10-CM | POA: Diagnosis not present

## 2017-05-15 DIAGNOSIS — Z79899 Other long term (current) drug therapy: Secondary | ICD-10-CM | POA: Diagnosis not present

## 2017-05-15 DIAGNOSIS — F1420 Cocaine dependence, uncomplicated: Secondary | ICD-10-CM | POA: Diagnosis not present

## 2017-05-15 DIAGNOSIS — F1020 Alcohol dependence, uncomplicated: Secondary | ICD-10-CM | POA: Diagnosis not present

## 2017-05-15 DIAGNOSIS — Z5181 Encounter for therapeutic drug level monitoring: Secondary | ICD-10-CM | POA: Diagnosis not present

## 2017-05-20 ENCOUNTER — Ambulatory Visit (INDEPENDENT_AMBULATORY_CARE_PROVIDER_SITE_OTHER): Payer: Medicare HMO | Admitting: Podiatry

## 2017-05-20 ENCOUNTER — Encounter: Payer: Self-pay | Admitting: Podiatry

## 2017-05-20 DIAGNOSIS — B351 Tinea unguium: Secondary | ICD-10-CM

## 2017-05-20 DIAGNOSIS — F1420 Cocaine dependence, uncomplicated: Secondary | ICD-10-CM | POA: Diagnosis not present

## 2017-05-20 DIAGNOSIS — Z79899 Other long term (current) drug therapy: Secondary | ICD-10-CM | POA: Diagnosis not present

## 2017-05-20 DIAGNOSIS — F1020 Alcohol dependence, uncomplicated: Secondary | ICD-10-CM | POA: Diagnosis not present

## 2017-05-20 MED ORDER — TERBINAFINE HCL 250 MG PO TABS: 250.0000 mg | ORAL_TABLET | Freq: Every day | ORAL | 0 refills | Status: DC

## 2017-05-22 DIAGNOSIS — F1420 Cocaine dependence, uncomplicated: Secondary | ICD-10-CM | POA: Diagnosis not present

## 2017-05-22 DIAGNOSIS — Z5181 Encounter for therapeutic drug level monitoring: Secondary | ICD-10-CM | POA: Diagnosis not present

## 2017-05-22 DIAGNOSIS — F1020 Alcohol dependence, uncomplicated: Secondary | ICD-10-CM | POA: Diagnosis not present

## 2017-05-22 DIAGNOSIS — Z79899 Other long term (current) drug therapy: Secondary | ICD-10-CM | POA: Diagnosis not present

## 2017-05-23 NOTE — Progress Notes (Signed)
   Subjective: Patient presents today 2 weeks post ingrown nail permanent nail avulsion procedure of the lateral border of the right great toenail. Patient states that the toe and nail fold is feeling much better. She is also here to review her nail biopsy results.    Past Medical History:  Diagnosis Date  . Anxiety   . Arthritis   . Breast cancer ([place])   . Depression   . Fibromyalgia   . Hypertension   . PTSD (post-traumatic stress disorder)     Objective: Skin is warm, dry and supple. Nail and respective nail fold appears to be healing appropriately. Open wound to the associated nail fold with a granular wound base and moderate amount of fibrotic tissue. Minimal drainage noted. Mild erythema around the periungual region likely due to phenol chemical matricectomy. Hyperkeratotic, discolored, thickened onychodystrophy of nails noted bilaterally.   Assessment: #1 postop permanent partial nail avulsion lateral border of the right great toe #2 open wound periungual nail fold of respective digit.  #3 onychomycosis bilaterally  Plan of care: #1 patient was evaluated  #2 debridement of open wound was performed to the periungual border of the respective toe using a currette. Antibiotic ointment and Band-Aid was applied. #3 nail biopsy reviewed. Patient wants to pursue fungal nail treatment although nail biopsy is negative for fungus. #4 Prescription for Lamisil 250 mg #90 provided to patient.  #5 patient is to return to clinic in 4 months.    Edrick Kins, DPM [facility]  Dr. Edrick Kins, [place]                                        [place],  [address]                Office [phone]  Fax [phone]

## 2017-05-27 DIAGNOSIS — F1020 Alcohol dependence, uncomplicated: Secondary | ICD-10-CM | POA: Diagnosis not present

## 2017-05-27 DIAGNOSIS — Z5181 Encounter for therapeutic drug level monitoring: Secondary | ICD-10-CM | POA: Diagnosis not present

## 2017-05-27 DIAGNOSIS — F1420 Cocaine dependence, uncomplicated: Secondary | ICD-10-CM | POA: Diagnosis not present

## 2017-05-27 DIAGNOSIS — Z79899 Other long term (current) drug therapy: Secondary | ICD-10-CM | POA: Diagnosis not present

## 2017-05-28 DIAGNOSIS — M25562 Pain in left knee: Secondary | ICD-10-CM | POA: Diagnosis not present

## 2017-05-28 DIAGNOSIS — M25561 Pain in right knee: Secondary | ICD-10-CM | POA: Diagnosis not present

## 2017-05-28 DIAGNOSIS — M170 Bilateral primary osteoarthritis of knee: Secondary | ICD-10-CM | POA: Diagnosis not present

## 2017-05-28 DIAGNOSIS — M1712 Unilateral primary osteoarthritis, left knee: Secondary | ICD-10-CM | POA: Diagnosis not present

## 2017-05-29 DIAGNOSIS — Z79899 Other long term (current) drug therapy: Secondary | ICD-10-CM | POA: Diagnosis not present

## 2017-05-29 DIAGNOSIS — Z5181 Encounter for therapeutic drug level monitoring: Secondary | ICD-10-CM | POA: Diagnosis not present

## 2017-05-30 DIAGNOSIS — F1420 Cocaine dependence, uncomplicated: Secondary | ICD-10-CM | POA: Diagnosis not present

## 2017-05-30 DIAGNOSIS — F1020 Alcohol dependence, uncomplicated: Secondary | ICD-10-CM | POA: Diagnosis not present

## 2017-06-04 DIAGNOSIS — F1020 Alcohol dependence, uncomplicated: Secondary | ICD-10-CM | POA: Diagnosis not present

## 2017-06-04 DIAGNOSIS — F1420 Cocaine dependence, uncomplicated: Secondary | ICD-10-CM | POA: Diagnosis not present

## 2017-06-04 DIAGNOSIS — Z79899 Other long term (current) drug therapy: Secondary | ICD-10-CM | POA: Diagnosis not present

## 2017-06-04 DIAGNOSIS — Z5181 Encounter for therapeutic drug level monitoring: Secondary | ICD-10-CM | POA: Diagnosis not present

## 2017-06-06 DIAGNOSIS — Z5181 Encounter for therapeutic drug level monitoring: Secondary | ICD-10-CM | POA: Diagnosis not present

## 2017-06-06 DIAGNOSIS — F1020 Alcohol dependence, uncomplicated: Secondary | ICD-10-CM | POA: Diagnosis not present

## 2017-06-06 DIAGNOSIS — Z79899 Other long term (current) drug therapy: Secondary | ICD-10-CM | POA: Diagnosis not present

## 2017-06-06 DIAGNOSIS — F1420 Cocaine dependence, uncomplicated: Secondary | ICD-10-CM | POA: Diagnosis not present

## 2017-06-10 DIAGNOSIS — F1420 Cocaine dependence, uncomplicated: Secondary | ICD-10-CM | POA: Diagnosis not present

## 2017-06-10 DIAGNOSIS — F1020 Alcohol dependence, uncomplicated: Secondary | ICD-10-CM | POA: Diagnosis not present

## 2017-06-11 DIAGNOSIS — Z5181 Encounter for therapeutic drug level monitoring: Secondary | ICD-10-CM | POA: Diagnosis not present

## 2017-06-11 DIAGNOSIS — Z79899 Other long term (current) drug therapy: Secondary | ICD-10-CM | POA: Diagnosis not present

## 2017-06-13 DIAGNOSIS — Z5181 Encounter for therapeutic drug level monitoring: Secondary | ICD-10-CM | POA: Diagnosis not present

## 2017-06-13 DIAGNOSIS — F1420 Cocaine dependence, uncomplicated: Secondary | ICD-10-CM | POA: Diagnosis not present

## 2017-06-13 DIAGNOSIS — Z79899 Other long term (current) drug therapy: Secondary | ICD-10-CM | POA: Diagnosis not present

## 2017-06-13 DIAGNOSIS — F1020 Alcohol dependence, uncomplicated: Secondary | ICD-10-CM | POA: Diagnosis not present

## 2017-06-14 ENCOUNTER — Ambulatory Visit: Payer: Self-pay | Admitting: Internal Medicine

## 2017-06-17 DIAGNOSIS — F1420 Cocaine dependence, uncomplicated: Secondary | ICD-10-CM | POA: Diagnosis not present

## 2017-06-17 DIAGNOSIS — F1020 Alcohol dependence, uncomplicated: Secondary | ICD-10-CM | POA: Diagnosis not present

## 2017-06-19 DIAGNOSIS — F1020 Alcohol dependence, uncomplicated: Secondary | ICD-10-CM | POA: Diagnosis not present

## 2017-06-19 DIAGNOSIS — F1420 Cocaine dependence, uncomplicated: Secondary | ICD-10-CM | POA: Diagnosis not present

## 2017-06-19 DIAGNOSIS — Z5181 Encounter for therapeutic drug level monitoring: Secondary | ICD-10-CM | POA: Diagnosis not present

## 2017-06-19 DIAGNOSIS — Z79899 Other long term (current) drug therapy: Secondary | ICD-10-CM | POA: Diagnosis not present

## 2017-06-21 DIAGNOSIS — Z5181 Encounter for therapeutic drug level monitoring: Secondary | ICD-10-CM | POA: Diagnosis not present

## 2017-06-21 DIAGNOSIS — Z79899 Other long term (current) drug therapy: Secondary | ICD-10-CM | POA: Diagnosis not present

## 2017-06-24 DIAGNOSIS — F1020 Alcohol dependence, uncomplicated: Secondary | ICD-10-CM | POA: Diagnosis not present

## 2017-06-24 DIAGNOSIS — F1420 Cocaine dependence, uncomplicated: Secondary | ICD-10-CM | POA: Diagnosis not present

## 2017-06-26 DIAGNOSIS — F1020 Alcohol dependence, uncomplicated: Secondary | ICD-10-CM | POA: Diagnosis not present

## 2017-06-26 DIAGNOSIS — F1420 Cocaine dependence, uncomplicated: Secondary | ICD-10-CM | POA: Diagnosis not present

## 2017-06-28 DIAGNOSIS — M1712 Unilateral primary osteoarthritis, left knee: Secondary | ICD-10-CM | POA: Diagnosis not present

## 2017-06-28 DIAGNOSIS — M21162 Varus deformity, not elsewhere classified, left knee: Secondary | ICD-10-CM | POA: Diagnosis not present

## 2017-06-28 DIAGNOSIS — M25562 Pain in left knee: Secondary | ICD-10-CM | POA: Diagnosis not present

## 2017-07-01 DIAGNOSIS — F1420 Cocaine dependence, uncomplicated: Secondary | ICD-10-CM | POA: Diagnosis not present

## 2017-07-01 DIAGNOSIS — F1020 Alcohol dependence, uncomplicated: Secondary | ICD-10-CM | POA: Diagnosis not present

## 2017-07-02 DIAGNOSIS — M1712 Unilateral primary osteoarthritis, left knee: Secondary | ICD-10-CM | POA: Diagnosis not present

## 2017-07-02 DIAGNOSIS — M25562 Pain in left knee: Secondary | ICD-10-CM | POA: Diagnosis not present

## 2017-07-03 DIAGNOSIS — F1020 Alcohol dependence, uncomplicated: Secondary | ICD-10-CM | POA: Diagnosis not present

## 2017-07-03 DIAGNOSIS — F1420 Cocaine dependence, uncomplicated: Secondary | ICD-10-CM | POA: Diagnosis not present

## 2017-07-08 DIAGNOSIS — F1420 Cocaine dependence, uncomplicated: Secondary | ICD-10-CM | POA: Diagnosis not present

## 2017-07-08 DIAGNOSIS — F1020 Alcohol dependence, uncomplicated: Secondary | ICD-10-CM | POA: Diagnosis not present

## 2017-07-10 DIAGNOSIS — F1020 Alcohol dependence, uncomplicated: Secondary | ICD-10-CM | POA: Diagnosis not present

## 2017-07-10 DIAGNOSIS — F1420 Cocaine dependence, uncomplicated: Secondary | ICD-10-CM | POA: Diagnosis not present

## 2017-07-11 ENCOUNTER — Encounter: Payer: Self-pay | Admitting: Internal Medicine

## 2017-07-11 ENCOUNTER — Ambulatory Visit: Payer: Medicare HMO | Attending: Internal Medicine | Admitting: Internal Medicine

## 2017-07-11 VITALS — BP 105/72 | HR 81 | Temp 98.6°F | Resp 16 | Wt 230.6 lb

## 2017-07-11 DIAGNOSIS — F17210 Nicotine dependence, cigarettes, uncomplicated: Secondary | ICD-10-CM | POA: Diagnosis not present

## 2017-07-11 DIAGNOSIS — M797 Fibromyalgia: Secondary | ICD-10-CM | POA: Insufficient documentation

## 2017-07-11 DIAGNOSIS — Z7984 Long term (current) use of oral hypoglycemic drugs: Secondary | ICD-10-CM | POA: Insufficient documentation

## 2017-07-11 DIAGNOSIS — I10 Essential (primary) hypertension: Secondary | ICD-10-CM

## 2017-07-11 DIAGNOSIS — R51 Headache: Secondary | ICD-10-CM

## 2017-07-11 DIAGNOSIS — M1712 Unilateral primary osteoarthritis, left knee: Secondary | ICD-10-CM | POA: Insufficient documentation

## 2017-07-11 DIAGNOSIS — Z79899 Other long term (current) drug therapy: Secondary | ICD-10-CM | POA: Diagnosis not present

## 2017-07-11 DIAGNOSIS — R519 Headache, unspecified: Secondary | ICD-10-CM

## 2017-07-11 DIAGNOSIS — Z23 Encounter for immunization: Secondary | ICD-10-CM | POA: Insufficient documentation

## 2017-07-11 DIAGNOSIS — Z90710 Acquired absence of both cervix and uterus: Secondary | ICD-10-CM | POA: Insufficient documentation

## 2017-07-11 DIAGNOSIS — Z888 Allergy status to other drugs, medicaments and biological substances status: Secondary | ICD-10-CM | POA: Diagnosis not present

## 2017-07-11 DIAGNOSIS — Z87440 Personal history of urinary (tract) infections: Secondary | ICD-10-CM | POA: Diagnosis not present

## 2017-07-11 DIAGNOSIS — Z853 Personal history of malignant neoplasm of breast: Secondary | ICD-10-CM | POA: Diagnosis not present

## 2017-07-11 DIAGNOSIS — Z9884 Bariatric surgery status: Secondary | ICD-10-CM | POA: Insufficient documentation

## 2017-07-11 DIAGNOSIS — R7303 Prediabetes: Secondary | ICD-10-CM | POA: Diagnosis not present

## 2017-07-11 DIAGNOSIS — K2100 Gastro-esophageal reflux disease with esophagitis, without bleeding: Secondary | ICD-10-CM

## 2017-07-11 DIAGNOSIS — M25562 Pain in left knee: Secondary | ICD-10-CM | POA: Diagnosis not present

## 2017-07-11 DIAGNOSIS — F1911 Other psychoactive substance abuse, in remission: Secondary | ICD-10-CM | POA: Insufficient documentation

## 2017-07-11 DIAGNOSIS — E559 Vitamin D deficiency, unspecified: Secondary | ICD-10-CM | POA: Insufficient documentation

## 2017-07-11 LAB — POCT GLYCOSYLATED HEMOGLOBIN (HGB A1C): HEMOGLOBIN A1C: 5.9

## 2017-07-11 LAB — GLUCOSE, POCT (MANUAL RESULT ENTRY): POC Glucose: 130 mg/dl — AB (ref 70–99)

## 2017-07-11 MED ORDER — RANITIDINE HCL 150 MG PO TABS: 150.0000 mg | ORAL_TABLET | Freq: Two times a day (BID) | ORAL | 3 refills | Status: DC

## 2017-07-11 MED ORDER — AMLODIPINE BESYLATE 10 MG PO TABS: 10.0000 mg | ORAL_TABLET | Freq: Every day | ORAL | 3 refills | Status: DC

## 2017-07-11 MED ORDER — METFORMIN HCL 500 MG PO TABS: 500.0000 mg | ORAL_TABLET | Freq: Every day | ORAL | 3 refills | Status: DC

## 2017-07-11 MED ORDER — HYDROCHLOROTHIAZIDE 12.5 MG PO TABS: 12.5000 mg | ORAL_TABLET | Freq: Every day | ORAL | 3 refills | Status: DC

## 2017-07-11 MED ORDER — CARVEDILOL 3.125 MG PO TABS: 3.1250 mg | ORAL_TABLET | Freq: Two times a day (BID) | ORAL | 3 refills | Status: DC

## 2017-07-11 NOTE — Progress Notes (Signed)
Pt is needing refills on amlodipine and hctz and the ranitidine.   Pt states she has been having a funny feeling in her head when you lean you head and you get this achy feeling and it goes away.

## 2017-07-11 NOTE — Progress Notes (Signed)
Patient ID: Rachel Woodard, female    DOB: 03-26-1951  MRN: [id number]  CC: Diabetes and Hypertension   Subjective: Rachel Woodard is a 67 y.o. female who presents for chronic ds management Her concerns today include:  Pmhx signif for pre-DM, mdd/anxiety, substance abuse (crack cocaine) in remission, htn, gerd, fibromyalgia, urge incont, Vit D def.  Breast CA RT 2013 ([facility] with lumpectomy and XRT)  1.  "Something not right in my head." Feels a tingling sensation all over the head.  Had a slight HA few days ago and one now.  Took Gabapentin few days ago and HA resolved No numbness, blurred vision, hx of HA, N/V, syncope  2.  HTN: Compliant with meds No CP/SOB/LE edema  3.  Subst abuse:  She has maintained remission.  Goes to support group 5 days a wk  4.  Eating better.  Less bread.  Trying to follow a dash diet.  Walking more. Seeing [facility] for her knees and had shots to LT knee. Very helpful.  They plan to do the same to the RT.  HM: due for flu shot.  Had c-scope in [place].  No polyps.  Due again in 2022.    Patient Active Problem List   Diagnosis Date Noted  . Pre-diabetes 02/14/2017  . Fibromyalgia 02/14/2017  . Substance abuse in remission ([place]) 02/14/2017  . Anxiety and depression 02/14/2017  . HX: breast cancer 02/14/2017  . HTN (hypertension), benign 11/13/2016  . Urge incontinence 11/13/2016  . Unilateral primary osteoarthritis, left knee 06/14/2016  . Status post hysterectomy 05/10/2016  . PMB (postmenopausal bleeding) 04/02/2016  . GERD (gastroesophageal reflux disease) 11/02/2015  . HSV-2 seropositive 04/20/2015  . Angioedema 10/24/2013  . Lower urinary tract infectious disease 10/24/2013  . Tobacco abuse 10/24/2013  . DCIS (ductal carcinoma in situ) of breast 04/07/2013  . Depression 04/07/2013     Current Outpatient Medications on File Prior to Visit  Medication Sig Dispense Refill  . cholecalciferol (VITAMIN D) 1000 units tablet  Take 5,000 Units by mouth daily.    Marland Kitchen gabapentin (NEURONTIN) 300 MG capsule Take 1 capsule (300 mg total) by mouth daily. 30 capsule 6  . terbinafine (LAMISIL) 250 MG tablet Take 1 tablet (250 mg total) by mouth daily. 90 tablet 0  . valACYclovir (VALTREX) 500 MG tablet Take 500 mg by mouth 2 (two) times daily.    Marland Kitchen venlafaxine XR (EFFEXOR XR) 75 MG 24 hr capsule Take 1 capsule (75 mg total) by mouth daily with breakfast. To make a total of 225 mg (take with 150 mg) 90 capsule 0  . venlafaxine XR (EFFEXOR-XR) 150 MG 24 hr capsule Take 1 capsule (150 mg total) by mouth daily with breakfast. + 75mg  = 225 qd 90 capsule 0   No current facility-administered medications on file prior to visit.     Allergies  Allergen Reactions  . Lisinopril     Social History   Socioeconomic History  . Marital status: Single    Spouse name: Not on file  . Number of children: Not on file  . Years of education: Not on file  . Highest education level: Not on file  Social Needs  . Financial resource strain: Not on file  . Food insecurity - worry: Not on file  . Food insecurity - inability: Not on file  . Transportation needs - medical: Not on file  . Transportation needs - non-medical: Not on file  Occupational History  . Not on file  Tobacco Use  . Smoking status: Current Every Day Smoker    Packs/day: 0.50    Types: Cigarettes  . Smokeless tobacco: Never Used  Substance and Sexual Activity  . Alcohol use: Yes    Comment: seldom  . Drug use: No    Comment: clean x 60 days on 11/28/15  . Sexual activity: Yes  Other Topics Concern  . Not on file  Social History Narrative  . Not on file    No family history on file.  Past Surgical History:  Procedure Laterality Date  . ABDOMINAL HYSTERECTOMY    . GASTRIC BYPASS  2001    ROS: Review of Systems Negative except as above PHYSICAL EXAM: BP 105/72   Pulse 81   Temp 98.6 F (37 C) (Oral)   Resp 16   Wt 230 lb 9.6 oz (104.6 kg)   SpO2 98%    BMI 37.22 kg/m   Wt Readings from Last 3 Encounters:  07/11/17 230 lb 9.6 oz (104.6 kg)  04/15/17 220 lb (99.8 kg)  02/14/17 226 lb 3.2 oz (102.6 kg)   Physical Exam  General appearance - alert, well appearing, and in no distress Mental status - alert, oriented to person, place, and time, normal mood, behavior, speech, dress, motor activity, and thought processes Mouth - mucous membranes moist, pharynx normal without lesions Neck - supple, no significant adenopathy Chest - clear to auscultation, no wheezes, rales or rhonchi, symmetric air entry Heart - normal rate, regular rhythm, normal S1, S2, no murmurs, rubs, clicks or gallops Neurological - cranial nerves II through XII intact, motor and sensory grossly normal bilaterally, Romberg sign negative, normal gait and station Extremities - peripheral pulses normal, no pedal edema, no clubbing or cyanosis  Results for orders placed or performed in visit on 07/11/17  POCT glucose (manual entry)  Result Value Ref Range   POC Glucose 130 (A) 70 - 99 mg/dl  POCT glycosylated hemoglobin (Hb A1C)  Result Value Ref Range   Hemoglobin A1C 5.9      ASSESSMENT AND PLAN: 1. Essential hypertension At goal.  Continue current medications and DASH diet. - Comprehensive metabolic panel - CBC - Lipid panel - amLODipine (NORVASC) 10 MG tablet; Take 1 tablet (10 mg total) by mouth daily.  Dispense: 90 tablet; Refill: 3 - hydrochlorothiazide (HYDRODIURIL) 12.5 MG tablet; Take 1 tablet (12.5 mg total) by mouth daily.  Dispense: 90 tablet; Refill: 3 - carvedilol (COREG) 3.125 MG tablet; Take 1 tablet (3.125 mg total) by mouth 2 (two) times daily with a meal.  Dispense: 180 tablet; Refill: 3  2. Pre-diabetes Continue metformin.  Encourage her to continue healthy eating habits - POCT glucose (manual entry) - POCT glycosylated hemoglobin (Hb A1C) - metFORMIN (GLUCOPHAGE) 500 MG tablet; Take 1 tablet (500 mg total) by mouth daily with breakfast.   Dispense: 90 tablet; Refill: 3  3. Vitamin D deficiency - VITAMIN D 25 Hydroxy (Vit-D Deficiency, Fractures)  4. Substance abuse in remission [facility]) Commended her that she has continued to abstain from drugs  5. Nonintractable headache, unspecified chronicity pattern, unspecified headache type -Neurologic exam is nonfocal at this time.  Advised that she takes the gabapentin every day as prescribed.  follow-up if no improvement for imaging.  6. Gastroesophageal reflux disease with esophagitis - ranitidine (ZANTAC) 150 MG tablet; Take 1 tablet (150 mg total) by mouth 2 (two) times daily.  Dispense: 180 tablet; Refill: 3   Patient was given the opportunity to ask questions.  Patient verbalized  understanding of the plan and was able to repeat key elements of the plan.   Orders Placed This Encounter  Procedures  . Flu Vaccine QUAD 6+ mos PF IM (Fluarix Quad PF)  . Comprehensive metabolic panel  . CBC  . Lipid panel  . POCT glucose (manual entry)  . POCT glycosylated hemoglobin (Hb A1C)     Requested Prescriptions   Signed Prescriptions Disp Refills  . amLODipine (NORVASC) 10 MG tablet 90 tablet 3    Sig: Take 1 tablet (10 mg total) by mouth daily.  . hydrochlorothiazide (HYDRODIURIL) 12.5 MG tablet 90 tablet 3    Sig: Take 1 tablet (12.5 mg total) by mouth daily.  . ranitidine (ZANTAC) 150 MG tablet 180 tablet 3    Sig: Take 1 tablet (150 mg total) by mouth 2 (two) times daily.  . carvedilol (COREG) 3.125 MG tablet 180 tablet 3    Sig: Take 1 tablet (3.125 mg total) by mouth 2 (two) times daily with a meal.  . metFORMIN (GLUCOPHAGE) 500 MG tablet 90 tablet 3    Sig: Take 1 tablet (500 mg total) by mouth daily with breakfast.    Return in about 4 months (around 11/08/2017).  Karle Plumber, MD, FACP

## 2017-07-11 NOTE — Patient Instructions (Addendum)
Try taking the Gabapentin daily.   Your neurologic exam today was normal.  Please follow up if the sensation in the head does not improve.    Influenza Virus Vaccine injection (Fluarix) What is this medicine? INFLUENZA VIRUS VACCINE (in floo EN zuh VAHY ruhs vak SEEN) helps to reduce the risk of getting influenza also known as the flu. This medicine may be used for other purposes; ask your health care provider or pharmacist if you have questions. COMMON BRAND NAME(S): Fluarix, Fluzone What should I tell my health care provider before I take this medicine? They need to know if you have any of these conditions: -bleeding disorder like hemophilia -fever or infection -Guillain-Barre syndrome or other neurological problems -immune system problems -infection with the human immunodeficiency virus (HIV) or AIDS -low blood platelet counts -multiple sclerosis -an unusual or allergic reaction to influenza virus vaccine, eggs, chicken proteins, latex, gentamicin, other medicines, foods, dyes or preservatives -pregnant or trying to get pregnant -breast-feeding How should I use this medicine? This vaccine is for injection into a muscle. It is given by a health care professional. A copy of Vaccine Information Statements will be given before each vaccination. Read this sheet carefully each time. The sheet may change frequently. Talk to your pediatrician regarding the use of this medicine in children. Special care may be needed. Overdosage: If you think you have taken too much of this medicine contact a poison control center or emergency room at once. NOTE: This medicine is only for you. Do not share this medicine with others. What if I miss a dose? This does not apply. What may interact with this medicine? -chemotherapy or radiation therapy -medicines that lower your immune system like etanercept, anakinra, infliximab, and adalimumab -medicines that treat or prevent blood clots like  warfarin -phenytoin -steroid medicines like prednisone or cortisone -theophylline -vaccines This list may not describe all possible interactions. Give your health care provider a list of all the medicines, herbs, non-prescription drugs, or dietary supplements you use. Also tell them if you smoke, drink alcohol, or use illegal drugs. Some items may interact with your medicine. What should I watch for while using this medicine? Report any side effects that do not go away within 3 days to your doctor or health care professional. Call your health care provider if any unusual symptoms occur within 6 weeks of receiving this vaccine. You may still catch the flu, but the illness is not usually as bad. You cannot get the flu from the vaccine. The vaccine will not protect against colds or other illnesses that may cause fever. The vaccine is needed every year. What side effects may I notice from receiving this medicine? Side effects that you should report to your doctor or health care professional as soon as possible: -allergic reactions like skin rash, itching or hives, swelling of the face, lips, or tongue Side effects that usually do not require medical attention (report to your doctor or health care professional if they continue or are bothersome): -fever -headache -muscle aches and pains -pain, tenderness, redness, or swelling at site where injected -weak or tired This list may not describe all possible side effects. Call your doctor for medical advice about side effects. You may report side effects to FDA at 1-800-FDA-1088. Where should I keep my medicine? This vaccine is only given in a clinic, pharmacy, doctor's office, or other health care setting and will not be stored at home. NOTE: This sheet is a summary. It may not cover all  possible information. If you have questions about this medicine, talk to your doctor, pharmacist, or health care provider.  2018 Elsevier/Gold Standard (2008-01-07  09:30:40)

## 2017-07-12 LAB — COMPREHENSIVE METABOLIC PANEL
ALT: 14 IU/L (ref 0–32)
AST: 14 IU/L (ref 0–40)
Albumin/Globulin Ratio: 1.4 (ref 1.2–2.2)
Albumin: 4.5 g/dL (ref 3.6–4.8)
Alkaline Phosphatase: 104 IU/L (ref 39–117)
BILIRUBIN TOTAL: 0.4 mg/dL (ref 0.0–1.2)
BUN / CREAT RATIO: 19 (ref 12–28)
BUN: 17 mg/dL (ref 8–27)
CHLORIDE: 106 mmol/L (ref 96–106)
CO2: 22 mmol/L (ref 20–29)
CREATININE: 0.91 mg/dL (ref 0.57–1.00)
Calcium: 9.3 mg/dL (ref 8.7–10.3)
GFR calc Af Amer: 77 mL/min/{1.73_m2} (ref >59)
GFR calc non Af Amer: 67 mL/min/{1.73_m2} (ref >59)
GLUCOSE: 96 mg/dL (ref 65–99)
Globulin, Total: 3.2 g/dL (ref 1.5–4.5)
Potassium: 4 mmol/L (ref 3.5–5.2)
Sodium: 140 mmol/L (ref 134–144)
Total Protein: 7.7 g/dL (ref 6.0–8.5)

## 2017-07-12 LAB — CBC
HEMATOCRIT: 46.8 % — AB (ref 34.0–46.6)
Hemoglobin: 15.7 g/dL (ref 11.1–15.9)
MCH: 28.7 pg (ref 26.6–33.0)
MCHC: 33.5 g/dL (ref 31.5–35.7)
MCV: 86 fL (ref 79–97)
PLATELETS: 239 10*3/uL (ref 150–379)
RBC: 5.47 x10E6/uL — AB (ref 3.77–5.28)
RDW: 15.4 % (ref 12.3–15.4)
WBC: 6.2 10*3/uL (ref 3.4–10.8)

## 2017-07-12 LAB — LIPID PANEL
Chol/HDL Ratio: 3.1 ratio (ref 0.0–4.4)
Cholesterol, Total: 163 mg/dL (ref 100–199)
HDL: 52 mg/dL (ref >39)
LDL Calculated: 97 mg/dL (ref 0–99)
TRIGLYCERIDES: 69 mg/dL (ref 0–149)
VLDL CHOLESTEROL CAL: 14 mg/dL (ref 5–40)

## 2017-07-12 LAB — VITAMIN D 25 HYDROXY (VIT D DEFICIENCY, FRACTURES): VIT D 25 HYDROXY: 24.5 ng/mL — AB (ref 30.0–100.0)

## 2017-07-15 DIAGNOSIS — F1020 Alcohol dependence, uncomplicated: Secondary | ICD-10-CM | POA: Diagnosis not present

## 2017-07-15 DIAGNOSIS — F1420 Cocaine dependence, uncomplicated: Secondary | ICD-10-CM | POA: Diagnosis not present

## 2017-07-16 DIAGNOSIS — Z5181 Encounter for therapeutic drug level monitoring: Secondary | ICD-10-CM | POA: Diagnosis not present

## 2017-07-16 DIAGNOSIS — Z79899 Other long term (current) drug therapy: Secondary | ICD-10-CM | POA: Diagnosis not present

## 2017-07-17 DIAGNOSIS — F1020 Alcohol dependence, uncomplicated: Secondary | ICD-10-CM | POA: Diagnosis not present

## 2017-07-17 DIAGNOSIS — F1420 Cocaine dependence, uncomplicated: Secondary | ICD-10-CM | POA: Diagnosis not present

## 2017-07-18 DIAGNOSIS — M25561 Pain in right knee: Secondary | ICD-10-CM | POA: Diagnosis not present

## 2017-07-18 DIAGNOSIS — Z5181 Encounter for therapeutic drug level monitoring: Secondary | ICD-10-CM | POA: Diagnosis not present

## 2017-07-18 DIAGNOSIS — M1711 Unilateral primary osteoarthritis, right knee: Secondary | ICD-10-CM | POA: Diagnosis not present

## 2017-07-18 DIAGNOSIS — Z79899 Other long term (current) drug therapy: Secondary | ICD-10-CM | POA: Diagnosis not present

## 2017-07-22 DIAGNOSIS — F1020 Alcohol dependence, uncomplicated: Secondary | ICD-10-CM | POA: Diagnosis not present

## 2017-07-22 DIAGNOSIS — F1420 Cocaine dependence, uncomplicated: Secondary | ICD-10-CM | POA: Diagnosis not present

## 2017-07-23 DIAGNOSIS — Z5181 Encounter for therapeutic drug level monitoring: Secondary | ICD-10-CM | POA: Diagnosis not present

## 2017-07-23 DIAGNOSIS — Z79899 Other long term (current) drug therapy: Secondary | ICD-10-CM | POA: Diagnosis not present

## 2017-07-24 DIAGNOSIS — F1020 Alcohol dependence, uncomplicated: Secondary | ICD-10-CM | POA: Diagnosis not present

## 2017-07-24 DIAGNOSIS — F1420 Cocaine dependence, uncomplicated: Secondary | ICD-10-CM | POA: Diagnosis not present

## 2017-07-25 DIAGNOSIS — Z79899 Other long term (current) drug therapy: Secondary | ICD-10-CM | POA: Diagnosis not present

## 2017-07-25 DIAGNOSIS — Z5181 Encounter for therapeutic drug level monitoring: Secondary | ICD-10-CM | POA: Diagnosis not present

## 2017-07-25 DIAGNOSIS — M1711 Unilateral primary osteoarthritis, right knee: Secondary | ICD-10-CM | POA: Diagnosis not present

## 2017-07-25 DIAGNOSIS — M25561 Pain in right knee: Secondary | ICD-10-CM | POA: Diagnosis not present

## 2017-07-26 DIAGNOSIS — F1420 Cocaine dependence, uncomplicated: Secondary | ICD-10-CM | POA: Diagnosis not present

## 2017-07-26 DIAGNOSIS — F1020 Alcohol dependence, uncomplicated: Secondary | ICD-10-CM | POA: Diagnosis not present

## 2017-07-30 DIAGNOSIS — Z79899 Other long term (current) drug therapy: Secondary | ICD-10-CM | POA: Diagnosis not present

## 2017-07-30 DIAGNOSIS — Z5181 Encounter for therapeutic drug level monitoring: Secondary | ICD-10-CM | POA: Diagnosis not present

## 2017-08-01 DIAGNOSIS — Z79899 Other long term (current) drug therapy: Secondary | ICD-10-CM | POA: Diagnosis not present

## 2017-08-01 DIAGNOSIS — Z5181 Encounter for therapeutic drug level monitoring: Secondary | ICD-10-CM | POA: Diagnosis not present

## 2017-08-05 DIAGNOSIS — F1420 Cocaine dependence, uncomplicated: Secondary | ICD-10-CM | POA: Diagnosis not present

## 2017-08-05 DIAGNOSIS — F1020 Alcohol dependence, uncomplicated: Secondary | ICD-10-CM | POA: Diagnosis not present

## 2017-08-05 DIAGNOSIS — Z79899 Other long term (current) drug therapy: Secondary | ICD-10-CM | POA: Diagnosis not present

## 2017-08-05 DIAGNOSIS — Z5181 Encounter for therapeutic drug level monitoring: Secondary | ICD-10-CM | POA: Diagnosis not present

## 2017-08-06 ENCOUNTER — Telehealth: Payer: Self-pay | Admitting: Internal Medicine

## 2017-08-06 DIAGNOSIS — M1711 Unilateral primary osteoarthritis, right knee: Secondary | ICD-10-CM | POA: Diagnosis not present

## 2017-08-06 DIAGNOSIS — M25561 Pain in right knee: Secondary | ICD-10-CM | POA: Diagnosis not present

## 2017-08-06 NOTE — Telephone Encounter (Signed)
This is listed as a historical med, will forward to PCP for review

## 2017-08-06 NOTE — Telephone Encounter (Signed)
Pt called to request a refill for  valACYclovir (VALTREX) 500 MG tablet Please sent it to [facility], [place] Please follow up

## 2017-08-07 DIAGNOSIS — Z5181 Encounter for therapeutic drug level monitoring: Secondary | ICD-10-CM | POA: Diagnosis not present

## 2017-08-07 DIAGNOSIS — Z79899 Other long term (current) drug therapy: Secondary | ICD-10-CM | POA: Diagnosis not present

## 2017-08-07 DIAGNOSIS — F1420 Cocaine dependence, uncomplicated: Secondary | ICD-10-CM | POA: Diagnosis not present

## 2017-08-07 DIAGNOSIS — F1020 Alcohol dependence, uncomplicated: Secondary | ICD-10-CM | POA: Diagnosis not present

## 2017-08-07 NOTE — Telephone Encounter (Signed)
Contacting pt to see why she is needing the Valtrex pt states she is has Genital Herpes.

## 2017-08-08 MED ORDER — VALACYCLOVIR HCL 500 MG PO TABS: 500.0000 mg | ORAL_TABLET | Freq: Two times a day (BID) | ORAL | 1 refills | Status: DC

## 2017-08-09 DIAGNOSIS — F1490 Cocaine use, unspecified, uncomplicated: Secondary | ICD-10-CM | POA: Diagnosis not present

## 2017-08-09 DIAGNOSIS — Z0289 Encounter for other administrative examinations: Secondary | ICD-10-CM | POA: Diagnosis not present

## 2017-08-09 DIAGNOSIS — I10 Essential (primary) hypertension: Secondary | ICD-10-CM | POA: Diagnosis not present

## 2017-08-09 DIAGNOSIS — M797 Fibromyalgia: Secondary | ICD-10-CM | POA: Diagnosis not present

## 2017-08-12 DIAGNOSIS — F1020 Alcohol dependence, uncomplicated: Secondary | ICD-10-CM | POA: Diagnosis not present

## 2017-08-12 DIAGNOSIS — F1420 Cocaine dependence, uncomplicated: Secondary | ICD-10-CM | POA: Diagnosis not present

## 2017-08-13 DIAGNOSIS — M25561 Pain in right knee: Secondary | ICD-10-CM | POA: Diagnosis not present

## 2017-08-13 DIAGNOSIS — Z79899 Other long term (current) drug therapy: Secondary | ICD-10-CM | POA: Diagnosis not present

## 2017-08-13 DIAGNOSIS — Z5181 Encounter for therapeutic drug level monitoring: Secondary | ICD-10-CM | POA: Diagnosis not present

## 2017-08-13 DIAGNOSIS — M1711 Unilateral primary osteoarthritis, right knee: Secondary | ICD-10-CM | POA: Diagnosis not present

## 2017-08-14 DIAGNOSIS — F1420 Cocaine dependence, uncomplicated: Secondary | ICD-10-CM | POA: Diagnosis not present

## 2017-08-14 DIAGNOSIS — F1020 Alcohol dependence, uncomplicated: Secondary | ICD-10-CM | POA: Diagnosis not present

## 2017-08-15 DIAGNOSIS — Z5181 Encounter for therapeutic drug level monitoring: Secondary | ICD-10-CM | POA: Diagnosis not present

## 2017-08-15 DIAGNOSIS — Z79899 Other long term (current) drug therapy: Secondary | ICD-10-CM | POA: Diagnosis not present

## 2017-08-19 DIAGNOSIS — Z79899 Other long term (current) drug therapy: Secondary | ICD-10-CM | POA: Diagnosis not present

## 2017-08-19 DIAGNOSIS — Z5181 Encounter for therapeutic drug level monitoring: Secondary | ICD-10-CM | POA: Diagnosis not present

## 2017-08-21 DIAGNOSIS — F1020 Alcohol dependence, uncomplicated: Secondary | ICD-10-CM | POA: Diagnosis not present

## 2017-08-21 DIAGNOSIS — Z5181 Encounter for therapeutic drug level monitoring: Secondary | ICD-10-CM | POA: Diagnosis not present

## 2017-08-21 DIAGNOSIS — F1420 Cocaine dependence, uncomplicated: Secondary | ICD-10-CM | POA: Diagnosis not present

## 2017-08-21 DIAGNOSIS — Z79899 Other long term (current) drug therapy: Secondary | ICD-10-CM | POA: Diagnosis not present

## 2017-08-26 DIAGNOSIS — Z79899 Other long term (current) drug therapy: Secondary | ICD-10-CM | POA: Diagnosis not present

## 2017-08-26 DIAGNOSIS — Z5181 Encounter for therapeutic drug level monitoring: Secondary | ICD-10-CM | POA: Diagnosis not present

## 2017-08-26 DIAGNOSIS — F1420 Cocaine dependence, uncomplicated: Secondary | ICD-10-CM | POA: Diagnosis not present

## 2017-08-26 DIAGNOSIS — F1020 Alcohol dependence, uncomplicated: Secondary | ICD-10-CM | POA: Diagnosis not present

## 2017-08-28 ENCOUNTER — Ambulatory Visit: Payer: Medicare HMO | Admitting: Podiatry

## 2017-08-28 DIAGNOSIS — Z5181 Encounter for therapeutic drug level monitoring: Secondary | ICD-10-CM | POA: Diagnosis not present

## 2017-08-28 DIAGNOSIS — Z79899 Other long term (current) drug therapy: Secondary | ICD-10-CM | POA: Diagnosis not present

## 2017-09-02 DIAGNOSIS — F1020 Alcohol dependence, uncomplicated: Secondary | ICD-10-CM | POA: Diagnosis not present

## 2017-09-02 DIAGNOSIS — F1420 Cocaine dependence, uncomplicated: Secondary | ICD-10-CM | POA: Diagnosis not present

## 2017-09-03 DIAGNOSIS — Z5181 Encounter for therapeutic drug level monitoring: Secondary | ICD-10-CM | POA: Diagnosis not present

## 2017-09-03 DIAGNOSIS — M25562 Pain in left knee: Secondary | ICD-10-CM | POA: Diagnosis not present

## 2017-09-03 DIAGNOSIS — M170 Bilateral primary osteoarthritis of knee: Secondary | ICD-10-CM | POA: Diagnosis not present

## 2017-09-03 DIAGNOSIS — Z79899 Other long term (current) drug therapy: Secondary | ICD-10-CM | POA: Diagnosis not present

## 2017-09-03 DIAGNOSIS — M25561 Pain in right knee: Secondary | ICD-10-CM | POA: Diagnosis not present

## 2017-09-04 ENCOUNTER — Telehealth: Payer: Self-pay | Admitting: Internal Medicine

## 2017-09-04 NOTE — Telephone Encounter (Signed)
Patient called to request a referral to [facility],due to her digestive problems and acid reflux. Please follow up

## 2017-09-05 ENCOUNTER — Telehealth: Payer: Self-pay | Admitting: Internal Medicine

## 2017-09-05 DIAGNOSIS — Z5181 Encounter for therapeutic drug level monitoring: Secondary | ICD-10-CM | POA: Diagnosis not present

## 2017-09-05 DIAGNOSIS — Z79899 Other long term (current) drug therapy: Secondary | ICD-10-CM | POA: Diagnosis not present

## 2017-09-05 NOTE — Telephone Encounter (Signed)
Patient called back and said yes for the GI referral and also said that if Dr. Wynetta Emery could treat her then she didn't need the referral

## 2017-09-05 NOTE — Telephone Encounter (Signed)
Will route to pcp 

## 2017-09-05 NOTE — Telephone Encounter (Signed)
Contacted to see if the request she asking for is a gi referral. Pt didn't answer left a detailed vm asking pt to give me a call letting me know

## 2017-09-06 NOTE — Telephone Encounter (Signed)
Patient has an appointment on the 09/24/2017

## 2017-09-09 DIAGNOSIS — F1420 Cocaine dependence, uncomplicated: Secondary | ICD-10-CM | POA: Diagnosis not present

## 2017-09-09 DIAGNOSIS — F1020 Alcohol dependence, uncomplicated: Secondary | ICD-10-CM | POA: Diagnosis not present

## 2017-09-16 DIAGNOSIS — F1420 Cocaine dependence, uncomplicated: Secondary | ICD-10-CM | POA: Diagnosis not present

## 2017-09-18 ENCOUNTER — Ambulatory Visit (INDEPENDENT_AMBULATORY_CARE_PROVIDER_SITE_OTHER): Payer: Medicare HMO | Admitting: Podiatry

## 2017-09-18 DIAGNOSIS — B351 Tinea unguium: Secondary | ICD-10-CM

## 2017-09-18 DIAGNOSIS — M79676 Pain in unspecified toe(s): Secondary | ICD-10-CM

## 2017-09-18 DIAGNOSIS — L603 Nail dystrophy: Secondary | ICD-10-CM

## 2017-09-23 DIAGNOSIS — F1420 Cocaine dependence, uncomplicated: Secondary | ICD-10-CM | POA: Diagnosis not present

## 2017-09-24 ENCOUNTER — Ambulatory Visit: Payer: Self-pay | Admitting: Internal Medicine

## 2017-09-24 NOTE — Progress Notes (Signed)
   SUBJECTIVE Patient presents to office today for follow up evaluation of fungal nails of bilateral feet. She reports elongated, thickened nails that cause pain while ambulating in shoes. She is unable to trim her own nails. She reports she feels like the nails are improving some and she has completed her course of Lamisil. Patient is here for further evaluation and treatment.   Past Medical History:  Diagnosis Date  . Anxiety   . Arthritis   . Breast cancer ([place])   . Depression   . Fibromyalgia   . Hypertension   . PTSD (post-traumatic stress disorder)     OBJECTIVE General Patient is awake, alert, and oriented x 3 and in no acute distress. Derm Skin is dry and supple bilateral. Negative open lesions or macerations. Remaining integument unremarkable. Nails are tender, long, thickened and dystrophic with subungual debris, consistent with onychomycosis, 1-5 bilateral. No signs of infection noted. Vasc  DP and PT pedal pulses palpable bilaterally. Temperature gradient within normal limits.  Neuro Epicritic and protective threshold sensation diminished bilaterally.  Musculoskeletal Exam No symptomatic pedal deformities noted bilateral. Muscular strength within normal limits.  ASSESSMENT 1. Onychodystrophic nails 1-5 bilateral with hyperkeratosis of nails.  2. Onychomycosis of nail due to dermatophyte bilateral 3. Pain in foot bilateral  PLAN OF CARE 1. Patient evaluated today.  2. Instructed to maintain good pedal hygiene and foot care.  3. Mechanical debridement of nails 1-5 bilaterally performed using a nail nipper. Filed with dremel without incident.  4. Patient just completed her Lamisil 250 mg #90.  5. I explained that dystrophic nails may be permanent.  6. Return to clinic as needed.    Edrick Kins, DPM [facility]  Dr. Edrick Kins, [address]                                        [place], [place] [address]                Office [phone]  Fax [phone]

## 2017-09-25 ENCOUNTER — Other Ambulatory Visit: Payer: Self-pay | Admitting: Internal Medicine

## 2017-09-25 DIAGNOSIS — F330 Major depressive disorder, recurrent, mild: Secondary | ICD-10-CM

## 2017-09-25 NOTE — Telephone Encounter (Signed)
Pt called to get refill for venlafaxine XR (EFFEXOR XR) 75 MG 24 hr capsule  venlafaxine XR (EFFEXOR-XR) 150 MG 24 hr capsule  Pt asking for a 90days supplies please sent it to [facility], [place] Please follow up

## 2017-09-26 MED ORDER — VENLAFAXINE HCL ER 150 MG PO CP24: 150.0000 mg | ORAL_CAPSULE | Freq: Every day | ORAL | 0 refills | Status: DC

## 2017-09-26 MED ORDER — VENLAFAXINE HCL ER 75 MG PO CP24: 75.0000 mg | ORAL_CAPSULE | Freq: Every day | ORAL | 0 refills | Status: DC

## 2017-09-26 NOTE — Telephone Encounter (Signed)
RF done on Effexor.

## 2017-09-26 NOTE — Addendum Note (Signed)
Addended by: Karle Plumber B on: 09/26/2017 08:17 AM   Modules accepted: Orders

## 2017-09-30 DIAGNOSIS — F14120 Cocaine abuse with intoxication, uncomplicated: Secondary | ICD-10-CM | POA: Diagnosis not present

## 2017-10-07 DIAGNOSIS — F1020 Alcohol dependence, uncomplicated: Secondary | ICD-10-CM | POA: Diagnosis not present

## 2017-10-14 DIAGNOSIS — F1420 Cocaine dependence, uncomplicated: Secondary | ICD-10-CM | POA: Diagnosis not present

## 2017-10-14 DIAGNOSIS — F1020 Alcohol dependence, uncomplicated: Secondary | ICD-10-CM | POA: Diagnosis not present

## 2017-10-25 DIAGNOSIS — F1420 Cocaine dependence, uncomplicated: Secondary | ICD-10-CM | POA: Diagnosis not present

## 2017-10-25 DIAGNOSIS — F1020 Alcohol dependence, uncomplicated: Secondary | ICD-10-CM | POA: Diagnosis not present

## 2017-10-28 ENCOUNTER — Ambulatory Visit (INDEPENDENT_AMBULATORY_CARE_PROVIDER_SITE_OTHER): Payer: Medicare HMO

## 2017-10-28 ENCOUNTER — Ambulatory Visit (INDEPENDENT_AMBULATORY_CARE_PROVIDER_SITE_OTHER): Payer: Medicare HMO | Admitting: Podiatry

## 2017-10-28 ENCOUNTER — Encounter: Payer: Self-pay | Admitting: Podiatry

## 2017-10-28 DIAGNOSIS — M722 Plantar fascial fibromatosis: Secondary | ICD-10-CM

## 2017-10-28 DIAGNOSIS — F1420 Cocaine dependence, uncomplicated: Secondary | ICD-10-CM | POA: Diagnosis not present

## 2017-10-28 DIAGNOSIS — F1020 Alcohol dependence, uncomplicated: Secondary | ICD-10-CM | POA: Diagnosis not present

## 2017-10-28 MED ORDER — MELOXICAM 15 MG PO TABS: 15.0000 mg | ORAL_TABLET | Freq: Every day | ORAL | 1 refills | Status: AC

## 2017-10-29 ENCOUNTER — Ambulatory Visit: Payer: Medicare HMO | Attending: Internal Medicine | Admitting: Internal Medicine

## 2017-10-29 ENCOUNTER — Telehealth: Payer: Self-pay | Admitting: Podiatry

## 2017-10-29 ENCOUNTER — Encounter: Payer: Self-pay | Admitting: Internal Medicine

## 2017-10-29 VITALS — BP 127/82 | HR 74 | Temp 98.4°F | Resp 16 | Wt 238.0 lb

## 2017-10-29 DIAGNOSIS — F329 Major depressive disorder, single episode, unspecified: Secondary | ICD-10-CM | POA: Insufficient documentation

## 2017-10-29 DIAGNOSIS — F419 Anxiety disorder, unspecified: Secondary | ICD-10-CM | POA: Insufficient documentation

## 2017-10-29 DIAGNOSIS — M797 Fibromyalgia: Secondary | ICD-10-CM | POA: Insufficient documentation

## 2017-10-29 DIAGNOSIS — F17210 Nicotine dependence, cigarettes, uncomplicated: Secondary | ICD-10-CM | POA: Diagnosis not present

## 2017-10-29 DIAGNOSIS — E559 Vitamin D deficiency, unspecified: Secondary | ICD-10-CM | POA: Insufficient documentation

## 2017-10-29 DIAGNOSIS — F1911 Other psychoactive substance abuse, in remission: Secondary | ICD-10-CM | POA: Insufficient documentation

## 2017-10-29 DIAGNOSIS — Z79899 Other long term (current) drug therapy: Secondary | ICD-10-CM | POA: Diagnosis not present

## 2017-10-29 DIAGNOSIS — K219 Gastro-esophageal reflux disease without esophagitis: Secondary | ICD-10-CM | POA: Diagnosis not present

## 2017-10-29 DIAGNOSIS — Z888 Allergy status to other drugs, medicaments and biological substances status: Secondary | ICD-10-CM | POA: Diagnosis not present

## 2017-10-29 DIAGNOSIS — E6609 Other obesity due to excess calories: Secondary | ICD-10-CM

## 2017-10-29 DIAGNOSIS — I10 Essential (primary) hypertension: Secondary | ICD-10-CM | POA: Insufficient documentation

## 2017-10-29 DIAGNOSIS — E669 Obesity, unspecified: Secondary | ICD-10-CM | POA: Diagnosis not present

## 2017-10-29 DIAGNOSIS — A6000 Herpesviral infection of urogenital system, unspecified: Secondary | ICD-10-CM | POA: Diagnosis not present

## 2017-10-29 DIAGNOSIS — Z853 Personal history of malignant neoplasm of breast: Secondary | ICD-10-CM | POA: Insufficient documentation

## 2017-10-29 DIAGNOSIS — Z7984 Long term (current) use of oral hypoglycemic drugs: Secondary | ICD-10-CM | POA: Insufficient documentation

## 2017-10-29 DIAGNOSIS — Z6838 Body mass index (BMI) 38.0-38.9, adult: Secondary | ICD-10-CM | POA: Insufficient documentation

## 2017-10-29 DIAGNOSIS — Z9884 Bariatric surgery status: Secondary | ICD-10-CM | POA: Insufficient documentation

## 2017-10-29 DIAGNOSIS — H6121 Impacted cerumen, right ear: Secondary | ICD-10-CM | POA: Diagnosis not present

## 2017-10-29 DIAGNOSIS — Z6841 Body Mass Index (BMI) 40.0 and over, adult: Secondary | ICD-10-CM | POA: Insufficient documentation

## 2017-10-29 DIAGNOSIS — R7303 Prediabetes: Secondary | ICD-10-CM

## 2017-10-29 LAB — GLUCOSE, POCT (MANUAL RESULT ENTRY): POC Glucose: 115 mg/dl — AB (ref 70–99)

## 2017-10-29 NOTE — Progress Notes (Signed)
Patient ID: Rachel Woodard, female    DOB: 05-Jan-1951  MRN: [id number]  CC: Hypertension and Diabetes   Subjective: Rachel Woodard is a 67 y.o. female who presents for chronic ds managment Her concerns today include:  PMH signif for pre-DM, mdd/anxiety, substance abuse (crack cocaine) in remission, htn, gerd, fibromyalgia, urge incont, Vit D def.Breast CA RT 2013 ([place] with lumpectomyandXRT)  C/o Itching in both ears and eyes.  Would like for me to look in the ears.  No decreased hearing or pain. Takes Mucinex  Genital Herpes: Used to be on daily suppressive therapy but reports that when she got outbreaks it seemed like it took longer to resolve compared to taking the medicine only when she has acute episode.  Lately she reports outbreaks about once a month which she attributes to increased stress.  However she states that she has changed some things in her life and anticipate that the outbreaks will be last now. She is in a new relationship but has not become sexually involved as yet.  She plans to use condoms consistently should that occur   She has change some things   HTN: Compliant with amlodipine, carvedilol and HCTZ.  Limit salt in the foods.  Denies any chest pains, shortness of breath or lower extremity edema. Walking more about QOD  Pre-DM:  Doing Okay on Metformin.  Trying to eat more fruits amd veggies, less starch  Vit D def: Level checked on last visit was in the 59s.  She was taking vitamin D supplement over-the-counter but has ran out.  She plans to purchase more  Patient Active Problem List   Diagnosis Date Noted  . Pre-diabetes 02/14/2017  . Fibromyalgia 02/14/2017  . Substance abuse in remission ([place]) 02/14/2017  . Anxiety and depression 02/14/2017  . HX: breast cancer 02/14/2017  . HTN (hypertension), benign 11/13/2016  . Urge incontinence 11/13/2016  . Unilateral primary osteoarthritis, left knee 06/14/2016  . Status post hysterectomy  05/10/2016  . PMB (postmenopausal bleeding) 04/02/2016  . GERD (gastroesophageal reflux disease) 11/02/2015  . HSV-2 seropositive 04/20/2015  . Angioedema 10/24/2013  . Lower urinary tract infectious disease 10/24/2013  . Tobacco abuse 10/24/2013  . DCIS (ductal carcinoma in situ) of breast 04/07/2013  . Depression 04/07/2013     Current Outpatient Medications on File Prior to Visit  Medication Sig Dispense Refill  . amLODipine (NORVASC) 10 MG tablet Take 1 tablet (10 mg total) by mouth daily. 90 tablet 3  . carvedilol (COREG) 3.125 MG tablet Take 1 tablet (3.125 mg total) by mouth 2 (two) times daily with a meal. 180 tablet 3  . cholecalciferol (VITAMIN D) 1000 units tablet Take 5,000 Units by mouth daily.    Marland Kitchen gabapentin (NEURONTIN) 300 MG capsule Take 1 capsule (300 mg total) by mouth daily. 30 capsule 6  . hydrochlorothiazide (HYDRODIURIL) 12.5 MG tablet Take 1 tablet (12.5 mg total) by mouth daily. 90 tablet 3  . meloxicam (MOBIC) 15 MG tablet Take 1 tablet (15 mg total) by mouth daily. 60 tablet 1  . metFORMIN (GLUCOPHAGE) 500 MG tablet Take 1 tablet (500 mg total) by mouth daily with breakfast. 90 tablet 3  . ranitidine (ZANTAC) 150 MG tablet Take 1 tablet (150 mg total) by mouth 2 (two) times daily. 180 tablet 3  . terbinafine (LAMISIL) 250 MG tablet Take 1 tablet (250 mg total) by mouth daily. 90 tablet 0  . valACYclovir (VALTREX) 500 MG tablet Take 1 tablet (500 mg total) by mouth  2 (two) times daily. 6 tablet 1  . venlafaxine XR (EFFEXOR XR) 75 MG 24 hr capsule Take 1 capsule (75 mg total) by mouth daily with breakfast. To make a total of 225 mg (take with 150 mg) 90 capsule 0  . venlafaxine XR (EFFEXOR-XR) 150 MG 24 hr capsule Take 1 capsule (150 mg total) by mouth daily with breakfast. + 75mg  = 225 qd 90 capsule 0   No current facility-administered medications on file prior to visit.     Allergies  Allergen Reactions  . Lisinopril     Social History   Socioeconomic  History  . Marital status: Single    Spouse name: Not on file  . Number of children: Not on file  . Years of education: Not on file  . Highest education level: Not on file  Occupational History  . Not on file  Social Needs  . Financial resource strain: Not on file  . Food insecurity:    Worry: Not on file    Inability: Not on file  . Transportation needs:    Medical: Not on file    Non-medical: Not on file  Tobacco Use  . Smoking status: Current Every Day Smoker    Packs/day: 0.50    Types: Cigarettes  . Smokeless tobacco: Never Used  Substance and Sexual Activity  . Alcohol use: Yes    Comment: seldom  . Drug use: No    Types: Cocaine    Comment: clean x 60 days on 11/28/15  . Sexual activity: Yes  Lifestyle  . Physical activity:    Days per week: Not on file    Minutes per session: Not on file  . Stress: Not on file  Relationships  . Social connections:    Talks on phone: Not on file    Gets together: Not on file    Attends religious service: Not on file    Active member of club or organization: Not on file    Attends meetings of clubs or organizations: Not on file    Relationship status: Not on file  . Intimate partner violence:    Fear of current or ex partner: Not on file    Emotionally abused: Not on file    Physically abused: Not on file    Forced sexual activity: Not on file  Other Topics Concern  . Not on file  Social History Narrative  . Not on file    No family history on file.  Past Surgical History:  Procedure Laterality Date  . ABDOMINAL HYSTERECTOMY    . GASTRIC BYPASS  2001    ROS: Review of Systems Negative except as stated above PHYSICAL EXAM: BP 127/82   Pulse 74   Temp 98.4 F (36.9 C) (Oral)   Resp 16   Wt 238 lb (108 kg)   SpO2 98%   BMI 38.41 kg/m   Wt Readings from Last 3 Encounters:  10/29/17 238 lb (108 kg)  07/11/17 230 lb 9.6 oz (104.6 kg)  04/15/17 220 lb (99.8 kg)    Physical Exam  General appearance - alert,  well appearing, and in no distress Mental status - alert, oriented to person, place, and time, normal mood, behavior, speech, dress, motor activity, and thought processes Ears -small amount of wax buildup in the right ear.  Tympanic membrane within normal limits.  Left ear canal and membrane within normal limits. Neck - supple, no significant adenopathy Chest - clear to auscultation, no wheezes, rales or rhonchi,  symmetric air entry Heart - normal rate, regular rhythm, normal S1, S2, no murmurs, rubs, clicks or gallops Extremities - peripheral pulses normal, no pedal edema, no clubbing or cyanosis  Results for orders placed or performed in visit on 10/29/17  POCT glucose (manual entry)  Result Value Ref Range   POC Glucose 115 (A) 70 - 99 mg/dl    ASSESSMENT AND PLAN: 1. Essential hypertension At goal.  Continue current medications and DASH diet.  2. Pre-diabetes Amended her on changing her eating habits and exercising more.  Continue metformin - POCT glucose (manual entry)  3. Vitamin D deficiency Advised to purchase vitamin D 1000 IU over-the-counter and take 1 daily  4. Genital herpes simplex, unspecified site Given that she is having outbreaks about once a month I recommend going back on daily suppressive therapy but patient does not want to do that.  He prefers to take the Valtrex only when she has outbreaks.  Advised to refrain from sexual intercourse during outbreaks  5. Class 2 obesity due to excess calories without serious comorbidity with body mass index (BMI) of 38.0 to 38.9 in adult See #2 above  6. Impacted cerumen of right ear Advised to use wax softener which she can purchase over-the-counter  Patient was given the opportunity to ask questions.  Patient verbalized understanding of the plan and was able to repeat key elements of the plan.   Orders Placed This Encounter  Procedures  . POCT glucose (manual entry)     Requested Prescriptions    No prescriptions  requested or ordered in this encounter    Return in about 4 months (around 03/01/2018).  Karle Plumber, MD, FACP

## 2017-10-29 NOTE — Telephone Encounter (Signed)
Patient called and asked if medication was sent to pharmacy I advised the patient that it was sent to [facility]. She stated that was okay.

## 2017-10-30 NOTE — Progress Notes (Signed)
   Subjective: 67 year old female presenting today with a chief complaint of pain to the plantar aspect of the right heel that began one month ago. Walking for long periods of time increases the pain. She has not done anything to treat the symptoms. Patient is here for further evaluation and treatment.   Past Medical History:  Diagnosis Date  . Anxiety   . Arthritis   . Breast cancer ([place])   . Depression   . Fibromyalgia   . Hypertension   . PTSD (post-traumatic stress disorder)      Objective: Physical Exam General: The patient is alert and oriented x3 in no acute distress.  Dermatology: Skin is warm, dry and supple bilateral lower extremities. Negative for open lesions or macerations bilateral.   Vascular: Dorsalis Pedis and Posterior Tibial pulses palpable bilateral.  Capillary fill time is immediate to all digits.  Neurological: Epicritic and protective threshold intact bilateral.   Musculoskeletal: Tenderness to palpation to the plantar aspect of the right heel along the plantar fascia. All other joints range of motion within normal limits bilateral. Strength 5/5 in all groups bilateral.   Radiographic exam: Normal osseous mineralization. Joint spaces preserved. No fracture/dislocation/boney destruction. No other soft tissue abnormalities or radiopaque foreign bodies.   Assessment: 1. Plantar fasciitis right 2. Pain in right foot  Plan of Care:  1. Patient evaluated. Xrays reviewed.   2. Injection of 0.5cc Celestone soluspan injected into the right plantar fascia  3. Rx for Meloxicam provided to patient.  4. Recommended good shoe gear.  5. Instructed patient regarding therapies and modalities at home to alleviate symptoms.  6. Return to clinic in 4 weeks.     Edrick Kins, DPM [facility]  Dr. Edrick Kins, DPM    [address], [place] [address]                Office [phone]  Fax [phone]

## 2017-11-05 DIAGNOSIS — H5203 Hypermetropia, bilateral: Secondary | ICD-10-CM | POA: Diagnosis not present

## 2017-11-08 DIAGNOSIS — F1420 Cocaine dependence, uncomplicated: Secondary | ICD-10-CM | POA: Diagnosis not present

## 2017-11-08 DIAGNOSIS — F1020 Alcohol dependence, uncomplicated: Secondary | ICD-10-CM | POA: Diagnosis not present

## 2017-11-11 DIAGNOSIS — F1020 Alcohol dependence, uncomplicated: Secondary | ICD-10-CM | POA: Diagnosis not present

## 2017-11-11 DIAGNOSIS — F1420 Cocaine dependence, uncomplicated: Secondary | ICD-10-CM | POA: Diagnosis not present

## 2017-11-20 DIAGNOSIS — F1020 Alcohol dependence, uncomplicated: Secondary | ICD-10-CM | POA: Diagnosis not present

## 2017-11-20 DIAGNOSIS — F1420 Cocaine dependence, uncomplicated: Secondary | ICD-10-CM | POA: Diagnosis not present

## 2017-11-25 ENCOUNTER — Encounter: Payer: Self-pay | Admitting: Podiatry

## 2017-11-25 ENCOUNTER — Ambulatory Visit (INDEPENDENT_AMBULATORY_CARE_PROVIDER_SITE_OTHER): Payer: Medicare HMO | Admitting: Podiatry

## 2017-11-25 DIAGNOSIS — F14120 Cocaine abuse with intoxication, uncomplicated: Secondary | ICD-10-CM | POA: Diagnosis not present

## 2017-11-25 DIAGNOSIS — M722 Plantar fascial fibromatosis: Secondary | ICD-10-CM

## 2017-11-25 DIAGNOSIS — F10120 Alcohol abuse with intoxication, uncomplicated: Secondary | ICD-10-CM | POA: Diagnosis not present

## 2017-11-26 DIAGNOSIS — Z5181 Encounter for therapeutic drug level monitoring: Secondary | ICD-10-CM | POA: Diagnosis not present

## 2017-11-26 DIAGNOSIS — Z79899 Other long term (current) drug therapy: Secondary | ICD-10-CM | POA: Diagnosis not present

## 2017-11-28 DIAGNOSIS — Z79899 Other long term (current) drug therapy: Secondary | ICD-10-CM | POA: Diagnosis not present

## 2017-11-28 DIAGNOSIS — Z5181 Encounter for therapeutic drug level monitoring: Secondary | ICD-10-CM | POA: Diagnosis not present

## 2017-11-28 NOTE — Progress Notes (Signed)
   Subjective: 67 year old female presenting today for follow up evaluation of plantar fasciitis of the right foot. She states she is much better. She has been taking the Meloxicam with significant relief. Patient is here for further evaluation and treatment.   Past Medical History:  Diagnosis Date  . Anxiety   . Arthritis   . Breast cancer ([place])   . Depression   . Fibromyalgia   . Hypertension   . PTSD (post-traumatic stress disorder)     Objective: Physical Exam General: The patient is alert and oriented x3 in no acute distress.  Dermatology: Skin is cool, dry and supple bilateral lower extremities. Negative for open lesions or macerations.  Vascular: Palpable pedal pulses bilaterally. No edema or erythema noted. Capillary refill within normal limits.  Neurological: Epicritic and protective threshold grossly intact bilaterally.   Musculoskeletal Exam: All pedal and ankle joints range of motion within normal limits bilateral. Muscle strength 5/5 in all groups bilateral.    Assessment: 1. Plantar fasciitis right - resolved   Plan of Care:  1. Patient evaluated. 2. Continue taking Meloxicam as needed.  3. Recommended good shoe gear.  4. Return to clinic as needed.    Edrick Kins, DPM [facility]  Dr. Edrick Kins, DPM    [address], [place] [address]                Office [phone]  Fax [phone]

## 2017-12-02 DIAGNOSIS — Z79899 Other long term (current) drug therapy: Secondary | ICD-10-CM | POA: Diagnosis not present

## 2017-12-02 DIAGNOSIS — Z5181 Encounter for therapeutic drug level monitoring: Secondary | ICD-10-CM | POA: Diagnosis not present

## 2017-12-02 DIAGNOSIS — F1020 Alcohol dependence, uncomplicated: Secondary | ICD-10-CM | POA: Diagnosis not present

## 2017-12-02 DIAGNOSIS — F1420 Cocaine dependence, uncomplicated: Secondary | ICD-10-CM | POA: Diagnosis not present

## 2017-12-09 DIAGNOSIS — F1020 Alcohol dependence, uncomplicated: Secondary | ICD-10-CM | POA: Diagnosis not present

## 2017-12-09 DIAGNOSIS — F1420 Cocaine dependence, uncomplicated: Secondary | ICD-10-CM | POA: Diagnosis not present

## 2017-12-16 DIAGNOSIS — F1420 Cocaine dependence, uncomplicated: Secondary | ICD-10-CM | POA: Diagnosis not present

## 2017-12-16 DIAGNOSIS — F1020 Alcohol dependence, uncomplicated: Secondary | ICD-10-CM | POA: Diagnosis not present

## 2017-12-20 ENCOUNTER — Other Ambulatory Visit: Payer: Self-pay | Admitting: Internal Medicine

## 2017-12-20 DIAGNOSIS — F330 Major depressive disorder, recurrent, mild: Secondary | ICD-10-CM

## 2017-12-23 DIAGNOSIS — F1020 Alcohol dependence, uncomplicated: Secondary | ICD-10-CM | POA: Diagnosis not present

## 2017-12-23 DIAGNOSIS — F1420 Cocaine dependence, uncomplicated: Secondary | ICD-10-CM | POA: Diagnosis not present

## 2017-12-24 ENCOUNTER — Other Ambulatory Visit: Payer: Self-pay | Admitting: Internal Medicine

## 2017-12-30 DIAGNOSIS — F1020 Alcohol dependence, uncomplicated: Secondary | ICD-10-CM | POA: Diagnosis not present

## 2017-12-30 DIAGNOSIS — F1420 Cocaine dependence, uncomplicated: Secondary | ICD-10-CM | POA: Diagnosis not present

## 2018-01-06 DIAGNOSIS — F1420 Cocaine dependence, uncomplicated: Secondary | ICD-10-CM | POA: Diagnosis not present

## 2018-01-06 DIAGNOSIS — F1020 Alcohol dependence, uncomplicated: Secondary | ICD-10-CM | POA: Diagnosis not present

## 2018-01-13 DIAGNOSIS — F14120 Cocaine abuse with intoxication, uncomplicated: Secondary | ICD-10-CM | POA: Diagnosis not present

## 2018-01-13 DIAGNOSIS — F10120 Alcohol abuse with intoxication, uncomplicated: Secondary | ICD-10-CM | POA: Diagnosis not present

## 2018-01-22 ENCOUNTER — Ambulatory Visit: Payer: Medicare HMO | Attending: Internal Medicine | Admitting: Physician Assistant

## 2018-01-22 VITALS — BP 111/72 | HR 76 | Temp 98.9°F | Resp 18 | Ht 66.0 in | Wt 233.0 lb

## 2018-01-22 DIAGNOSIS — Z853 Personal history of malignant neoplasm of breast: Secondary | ICD-10-CM | POA: Diagnosis not present

## 2018-01-22 DIAGNOSIS — Z7984 Long term (current) use of oral hypoglycemic drugs: Secondary | ICD-10-CM | POA: Diagnosis not present

## 2018-01-22 DIAGNOSIS — M797 Fibromyalgia: Secondary | ICD-10-CM | POA: Insufficient documentation

## 2018-01-22 DIAGNOSIS — E559 Vitamin D deficiency, unspecified: Secondary | ICD-10-CM | POA: Diagnosis not present

## 2018-01-22 DIAGNOSIS — F4310 Post-traumatic stress disorder, unspecified: Secondary | ICD-10-CM | POA: Insufficient documentation

## 2018-01-22 DIAGNOSIS — F329 Major depressive disorder, single episode, unspecified: Secondary | ICD-10-CM | POA: Insufficient documentation

## 2018-01-22 DIAGNOSIS — R7303 Prediabetes: Secondary | ICD-10-CM | POA: Insufficient documentation

## 2018-01-22 DIAGNOSIS — Z79899 Other long term (current) drug therapy: Secondary | ICD-10-CM | POA: Diagnosis not present

## 2018-01-22 DIAGNOSIS — R519 Headache, unspecified: Secondary | ICD-10-CM

## 2018-01-22 DIAGNOSIS — R5383 Other fatigue: Secondary | ICD-10-CM | POA: Diagnosis not present

## 2018-01-22 DIAGNOSIS — I10 Essential (primary) hypertension: Secondary | ICD-10-CM | POA: Insufficient documentation

## 2018-01-22 DIAGNOSIS — R252 Cramp and spasm: Secondary | ICD-10-CM | POA: Diagnosis not present

## 2018-01-22 DIAGNOSIS — R51 Headache: Secondary | ICD-10-CM | POA: Diagnosis not present

## 2018-01-22 DIAGNOSIS — Z888 Allergy status to other drugs, medicaments and biological substances status: Secondary | ICD-10-CM | POA: Insufficient documentation

## 2018-01-22 DIAGNOSIS — F14120 Cocaine abuse with intoxication, uncomplicated: Secondary | ICD-10-CM | POA: Diagnosis not present

## 2018-01-22 DIAGNOSIS — F10120 Alcohol abuse with intoxication, uncomplicated: Secondary | ICD-10-CM | POA: Diagnosis not present

## 2018-01-22 LAB — POCT ABI - SCREENING FOR PILOT NO CHARGE
Immediate ABI left: 1.24
Immediate ABI right: 1.28

## 2018-01-22 LAB — GLUCOSE, POCT (MANUAL RESULT ENTRY): POC Glucose: 100 mg/dl — AB (ref 70–99)

## 2018-01-22 MED ORDER — VALACYCLOVIR HCL 500 MG PO TABS: 500.0000 mg | ORAL_TABLET | Freq: Two times a day (BID) | ORAL | 2 refills | Status: DC

## 2018-01-22 MED ORDER — METHOCARBAMOL 500 MG PO TABS: 500.0000 mg | ORAL_TABLET | Freq: Three times a day (TID) | ORAL | 0 refills | Status: DC | PRN

## 2018-01-22 MED ORDER — NAPROXEN 500 MG PO TABS: 500.0000 mg | ORAL_TABLET | Freq: Two times a day (BID) | ORAL | 0 refills | Status: DC

## 2018-01-22 NOTE — Patient Instructions (Signed)
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
## 2018-01-23 ENCOUNTER — Other Ambulatory Visit: Payer: Self-pay | Admitting: Physician Assistant

## 2018-01-23 DIAGNOSIS — E559 Vitamin D deficiency, unspecified: Secondary | ICD-10-CM

## 2018-01-23 LAB — CBC WITH DIFFERENTIAL/PLATELET
BASOS: 0 %
Basophils Absolute: 0 10*3/uL (ref 0.0–0.2)
EOS (ABSOLUTE): 0.2 10*3/uL (ref 0.0–0.4)
Eos: 2 %
Hematocrit: 45.7 % (ref 34.0–46.6)
Hemoglobin: 14.8 g/dL (ref 11.1–15.9)
IMMATURE GRANS (ABS): 0 10*3/uL (ref 0.0–0.1)
Immature Granulocytes: 0 %
LYMPHS: 35 %
Lymphocytes Absolute: 2.7 10*3/uL (ref 0.7–3.1)
MCH: 28.2 pg (ref 26.6–33.0)
MCHC: 32.4 g/dL (ref 31.5–35.7)
MCV: 87 fL (ref 79–97)
MONOS ABS: 0.8 10*3/uL (ref 0.1–0.9)
Monocytes: 10 %
NEUTROS ABS: 4.1 10*3/uL (ref 1.4–7.0)
Neutrophils: 53 %
PLATELETS: 231 10*3/uL (ref 150–450)
RBC: 5.24 x10E6/uL (ref 3.77–5.28)
RDW: 14.5 % (ref 12.3–15.4)
WBC: 7.7 10*3/uL (ref 3.4–10.8)

## 2018-01-23 LAB — COMPREHENSIVE METABOLIC PANEL
A/G RATIO: 1.4 (ref 1.2–2.2)
ALBUMIN: 4.3 g/dL (ref 3.6–4.8)
ALT: 53 IU/L — ABNORMAL HIGH (ref 0–32)
AST: 25 IU/L (ref 0–40)
Alkaline Phosphatase: 134 IU/L — ABNORMAL HIGH (ref 39–117)
BUN / CREAT RATIO: 19 (ref 12–28)
BUN: 18 mg/dL (ref 8–27)
Bilirubin Total: 0.3 mg/dL (ref 0.0–1.2)
CALCIUM: 9.3 mg/dL (ref 8.7–10.3)
CO2: 23 mmol/L (ref 20–29)
Chloride: 103 mmol/L (ref 96–106)
Creatinine, Ser: 0.95 mg/dL (ref 0.57–1.00)
GFR, EST AFRICAN AMERICAN: 73 mL/min/{1.73_m2} (ref >59)
GFR, EST NON AFRICAN AMERICAN: 63 mL/min/{1.73_m2} (ref >59)
GLOBULIN, TOTAL: 3 g/dL (ref 1.5–4.5)
Glucose: 85 mg/dL (ref 65–99)
POTASSIUM: 4.6 mmol/L (ref 3.5–5.2)
SODIUM: 144 mmol/L (ref 134–144)
TOTAL PROTEIN: 7.3 g/dL (ref 6.0–8.5)

## 2018-01-23 LAB — TSH: TSH: 1.25 u[IU]/mL (ref 0.450–4.500)

## 2018-01-23 LAB — VITAMIN D 25 HYDROXY (VIT D DEFICIENCY, FRACTURES): Vit D, 25-Hydroxy: 23 ng/mL — ABNORMAL LOW (ref 30.0–100.0)

## 2018-01-23 LAB — HEMOGLOBIN A1C
Est. average glucose Bld gHb Est-mCnc: 117 mg/dL
Hgb A1c MFr Bld: 5.7 % — ABNORMAL HIGH (ref 4.8–5.6)

## 2018-01-23 MED ORDER — VITAMIN D (ERGOCALCIFEROL) 1.25 MG (50000 UNIT) PO CAPS: 50000.0000 [IU] | ORAL_CAPSULE | ORAL | 0 refills | Status: DC

## 2018-01-24 ENCOUNTER — Telehealth: Payer: Self-pay | Admitting: *Deleted

## 2018-01-24 NOTE — Telephone Encounter (Signed)
-----   Message from Argentina Donovan, [place] sent at 01/23/2018  8:35 AM EDT ----- Vitamin D is low.  This can contribute to muscle aches, anxiety, fatigue, and depression.  I have sent a prescription to the pharmacy for you to take once a week.  We will recheck this level in 3-4 months.  Your blood sugar level is controlled. Thyroid is normal.  Kidney function, electrolytes, and liver function are normal.  Follow-up as planned.  Thanks, Freeman Caldron, PA-C

## 2018-01-24 NOTE — Telephone Encounter (Signed)
Patient verified DOB Patient is aware of vitamin d level being low and needing to take the 50000 untie supplement instead of the daily 2000 that she has been using. Patient is aware of all other labs being normal and just repeating a recheck in 3-4 months. No further questions.

## 2018-01-27 DIAGNOSIS — F1020 Alcohol dependence, uncomplicated: Secondary | ICD-10-CM | POA: Diagnosis not present

## 2018-01-27 DIAGNOSIS — F1420 Cocaine dependence, uncomplicated: Secondary | ICD-10-CM | POA: Diagnosis not present

## 2018-02-03 DIAGNOSIS — F14120 Cocaine abuse with intoxication, uncomplicated: Secondary | ICD-10-CM | POA: Diagnosis not present

## 2018-02-03 DIAGNOSIS — F10120 Alcohol abuse with intoxication, uncomplicated: Secondary | ICD-10-CM | POA: Diagnosis not present

## 2018-02-12 DIAGNOSIS — F1020 Alcohol dependence, uncomplicated: Secondary | ICD-10-CM | POA: Diagnosis not present

## 2018-02-12 DIAGNOSIS — F1420 Cocaine dependence, uncomplicated: Secondary | ICD-10-CM | POA: Diagnosis not present

## 2018-02-17 DIAGNOSIS — F14120 Cocaine abuse with intoxication, uncomplicated: Secondary | ICD-10-CM | POA: Diagnosis not present

## 2018-02-17 DIAGNOSIS — F10120 Alcohol abuse with intoxication, uncomplicated: Secondary | ICD-10-CM | POA: Diagnosis not present

## 2018-02-27 ENCOUNTER — Telehealth: Payer: Self-pay | Admitting: Internal Medicine

## 2018-02-27 NOTE — Telephone Encounter (Signed)
Patient called to inform us that she has not received a call for the appointment for the MRI. Please follow up with patient.

## 2018-03-03 DIAGNOSIS — F1420 Cocaine dependence, uncomplicated: Secondary | ICD-10-CM | POA: Diagnosis not present

## 2018-03-03 DIAGNOSIS — F1020 Alcohol dependence, uncomplicated: Secondary | ICD-10-CM | POA: Diagnosis not present

## 2018-03-06 NOTE — Telephone Encounter (Signed)
MA attempted to complete a PA for patients CT of the head. Patients CT was denied and a clinical review has been faxed over. MA was on the phone for 50 minutes with representatives. Patient verified DOB Patient is aware of clinicial review being needed.

## 2018-03-10 DIAGNOSIS — F10120 Alcohol abuse with intoxication, uncomplicated: Secondary | ICD-10-CM | POA: Diagnosis not present

## 2018-03-10 DIAGNOSIS — F14120 Cocaine abuse with intoxication, uncomplicated: Secondary | ICD-10-CM | POA: Diagnosis not present

## 2018-03-14 ENCOUNTER — Other Ambulatory Visit: Payer: Self-pay | Admitting: Internal Medicine

## 2018-03-14 DIAGNOSIS — Z1231 Encounter for screening mammogram for malignant neoplasm of breast: Secondary | ICD-10-CM

## 2018-03-24 DIAGNOSIS — F1420 Cocaine dependence, uncomplicated: Secondary | ICD-10-CM | POA: Diagnosis not present

## 2018-03-24 DIAGNOSIS — F1020 Alcohol dependence, uncomplicated: Secondary | ICD-10-CM | POA: Diagnosis not present

## 2018-03-31 DIAGNOSIS — F1020 Alcohol dependence, uncomplicated: Secondary | ICD-10-CM | POA: Diagnosis not present

## 2018-03-31 DIAGNOSIS — F1420 Cocaine dependence, uncomplicated: Secondary | ICD-10-CM | POA: Diagnosis not present

## 2018-04-03 ENCOUNTER — Other Ambulatory Visit: Payer: Self-pay

## 2018-04-03 DIAGNOSIS — F330 Major depressive disorder, recurrent, mild: Secondary | ICD-10-CM

## 2018-04-03 DIAGNOSIS — R7303 Prediabetes: Secondary | ICD-10-CM

## 2018-04-03 MED ORDER — METFORMIN HCL 500 MG PO TABS: 500.0000 mg | ORAL_TABLET | Freq: Every day | ORAL | 0 refills | Status: DC

## 2018-04-03 MED ORDER — VENLAFAXINE HCL ER 150 MG PO CP24: ORAL_CAPSULE | ORAL | 0 refills | Status: DC

## 2018-04-03 MED ORDER — VENLAFAXINE HCL ER 75 MG PO CP24: 75.0000 mg | ORAL_CAPSULE | Freq: Every day | ORAL | 0 refills | Status: DC

## 2018-04-07 ENCOUNTER — Other Ambulatory Visit: Payer: Self-pay

## 2018-04-07 DIAGNOSIS — F10120 Alcohol abuse with intoxication, uncomplicated: Secondary | ICD-10-CM | POA: Diagnosis not present

## 2018-04-07 DIAGNOSIS — I10 Essential (primary) hypertension: Secondary | ICD-10-CM

## 2018-04-07 DIAGNOSIS — F14120 Cocaine abuse with intoxication, uncomplicated: Secondary | ICD-10-CM | POA: Diagnosis not present

## 2018-04-07 MED ORDER — AMLODIPINE BESYLATE 10 MG PO TABS: 10.0000 mg | ORAL_TABLET | Freq: Every day | ORAL | 0 refills | Status: DC

## 2018-04-07 MED ORDER — CARVEDILOL 3.125 MG PO TABS: 3.1250 mg | ORAL_TABLET | Freq: Two times a day (BID) | ORAL | 0 refills | Status: DC

## 2018-04-07 MED ORDER — HYDROCHLOROTHIAZIDE 12.5 MG PO TABS: 12.5000 mg | ORAL_TABLET | Freq: Every day | ORAL | 0 refills | Status: DC

## 2018-04-08 ENCOUNTER — Other Ambulatory Visit: Payer: Self-pay | Admitting: Internal Medicine

## 2018-04-08 DIAGNOSIS — I10 Essential (primary) hypertension: Secondary | ICD-10-CM

## 2018-04-09 MED ORDER — HYDROCHLOROTHIAZIDE 12.5 MG PO TABS: 12.5000 mg | ORAL_TABLET | Freq: Every day | ORAL | 1 refills | Status: DC

## 2018-04-09 MED ORDER — CARVEDILOL 3.125 MG PO TABS: 3.1250 mg | ORAL_TABLET | Freq: Two times a day (BID) | ORAL | 1 refills | Status: DC

## 2018-04-09 MED ORDER — AMLODIPINE BESYLATE 10 MG PO TABS: 10.0000 mg | ORAL_TABLET | Freq: Every day | ORAL | 1 refills | Status: DC

## 2018-04-15 ENCOUNTER — Ambulatory Visit: Payer: Self-pay

## 2018-04-24 ENCOUNTER — Encounter: Payer: Self-pay | Admitting: Internal Medicine

## 2018-04-24 ENCOUNTER — Ambulatory Visit (HOSPITAL_BASED_OUTPATIENT_CLINIC_OR_DEPARTMENT_OTHER): Payer: Medicare HMO | Admitting: Internal Medicine

## 2018-04-24 ENCOUNTER — Other Ambulatory Visit (HOSPITAL_COMMUNITY)
Admission: RE | Admit: 2018-04-24 | Discharge: 2018-04-24 | Disposition: A | Discharge status: Home / Self Care | Payer: Medicare HMO | Source: Ambulatory Visit | Attending: Internal Medicine | Admitting: Internal Medicine

## 2018-04-24 VITALS — BP 141/80 | HR 88 | Temp 98.5°F | Resp 16 | Wt 237.6 lb

## 2018-04-24 DIAGNOSIS — Z599 Problem related to housing and economic circumstances, unspecified: Secondary | ICD-10-CM | POA: Diagnosis not present

## 2018-04-24 DIAGNOSIS — Z59819 Housing instability, housed unspecified: Secondary | ICD-10-CM

## 2018-04-24 DIAGNOSIS — R631 Polydipsia: Secondary | ICD-10-CM

## 2018-04-24 DIAGNOSIS — N898 Other specified noninflammatory disorders of vagina: Secondary | ICD-10-CM | POA: Diagnosis not present

## 2018-04-24 DIAGNOSIS — F17200 Nicotine dependence, unspecified, uncomplicated: Secondary | ICD-10-CM | POA: Diagnosis not present

## 2018-04-24 DIAGNOSIS — N3281 Overactive bladder: Secondary | ICD-10-CM

## 2018-04-24 DIAGNOSIS — I10 Essential (primary) hypertension: Secondary | ICD-10-CM

## 2018-04-24 DIAGNOSIS — E559 Vitamin D deficiency, unspecified: Secondary | ICD-10-CM | POA: Diagnosis not present

## 2018-04-24 DIAGNOSIS — R7303 Prediabetes: Secondary | ICD-10-CM

## 2018-04-24 LAB — GLUCOSE, POCT (MANUAL RESULT ENTRY): POC GLUCOSE: 189 mg/dL — AB (ref 70–99)

## 2018-04-24 MED ORDER — SOLIFENACIN SUCCINATE 5 MG PO TABS: 5.0000 mg | ORAL_TABLET | Freq: Every day | ORAL | 2 refills | Status: DC

## 2018-04-24 NOTE — Patient Instructions (Signed)

## 2018-04-24 NOTE — Progress Notes (Signed)
Patient ID: Rachel Woodard, female    DOB: [birth year]  MRN: [id number]  CC: Diabetes (pre-diabetes); Hypertension; and Vaginal Discharge   Subjective: Rachel Woodard is a 67 y.o. female who presents for chronic ds management Her concerns today include:  PMH signif forpre-DM,mdd/anxiety, substance abuse (crack cocaine) in remission, htn, gerd, fibromyalgia, urge incont, Vit D def.Breast CA RT 2013 ([place] with lumpectomyandXRT)  Brings FL2 form to get into hosuing Was going to [facility].  This program has transitioned [facility] to [facility].  This is a program for people with dual dx - mental and substance abuse.  Clean for 384 days. She just started going to [facility] will be seeing a therapist and psychiatrist. [facility]ia Constable has a program to try to prevent homeless. Currently living with her boyfriend but she does not consider this ideal.  HTN:  Did not take medicines as yet for today.  However she reports compliance with medications.  C/o Vaginal dischg x 2 mths No itching Not sexually active  Pre-DM: reports excess thirst and urination x 2 mths.  Has hx of overactive bladder and reports some leakage of urine at times before she can make it to the restroom..  She has to urinate about every 2 hours.  She drinks mainly water and sometimes tea.  She was tried with Ditropan in the past and did not tolerate it.  We had offered trying her with Vesicare in the past but patient deferred.  Denies any dysuria.   Tob: Still smoking about half a pack a day.  She has nicotine gum at home which she finds helpful when she decides to use it.    Patient Active Problem List   Diagnosis Date Noted  . Genital herpes simplex 10/29/2017  . Vitamin D deficiency 10/29/2017  . Class 2 obesity due to excess calories without serious comorbidity with body mass index (BMI) of 38.0 to 38.9 in adult 10/29/2017  . Essential hypertension 10/29/2017  . Pre-diabetes 02/14/2017  .  Fibromyalgia 02/14/2017  . Substance abuse in remission ([place]) 02/14/2017  . Anxiety and depression 02/14/2017  . HX: breast cancer 02/14/2017  . Urge incontinence 11/13/2016  . Unilateral primary osteoarthritis, left knee 06/14/2016  . PMB (postmenopausal bleeding) 04/02/2016  . GERD (gastroesophageal reflux disease) 11/02/2015  . Angioedema 10/24/2013  . Tobacco abuse 10/24/2013  . DCIS (ductal carcinoma in situ) of breast 04/07/2013  . Depression 04/07/2013     Current Outpatient Medications on File Prior to Visit  Medication Sig Dispense Refill  . amLODipine (NORVASC) 10 MG tablet Take 1 tablet (10 mg total) by mouth daily. 90 tablet 1  . carvedilol (COREG) 3.125 MG tablet Take 1 tablet (3.125 mg total) by mouth 2 (two) times daily with a meal. 180 tablet 1  . cholecalciferol (VITAMIN D) 1000 units tablet Take 5,000 Units by mouth daily.    Marland Kitchen gabapentin (NEURONTIN) 300 MG capsule Take 1 capsule (300 mg total) by mouth daily. (Patient not taking: Reported on 04/24/2018) 30 capsule 6  . hydrochlorothiazide (HYDRODIURIL) 12.5 MG tablet Take 1 tablet (12.5 mg total) by mouth daily. 90 tablet 1  . metFORMIN (GLUCOPHAGE) 500 MG tablet Take 1 tablet (500 mg total) by mouth daily with breakfast. 90 tablet 0  . methocarbamol (ROBAXIN) 500 MG tablet Take 1 tablet (500 mg total) by mouth every 8 (eight) hours as needed for muscle spasms. 60 tablet 0  . naproxen (NAPROSYN) 500 MG tablet Take 1 tablet (500 mg  total) by mouth 2 (two) times daily with a meal. Prn pain 60 tablet 0  . valACYclovir (VALTREX) 500 MG tablet Take 1 tablet (500 mg total) by mouth 2 (two) times daily. 60 tablet 2  . venlafaxine XR (EFFEXOR-XR) 150 MG 24 hr capsule TAKE 1 CAPSULE (150 MG TOTAL) BY MOUTH DAILY WITH BREAKFAST. + 75MG  = 225 MG DAILY 90 capsule 0  . venlafaxine XR (EFFEXOR-XR) 75 MG 24 hr capsule Take 1 capsule (75 mg total) by mouth daily with breakfast. To make a total of 225 mg (take with 150 mg) 90 capsule 0    . Vitamin D, Ergocalciferol, (DRISDOL) 50000 units CAPS capsule Take 1 capsule (50,000 Units total) by mouth every 7 (seven) days. 16 capsule 0   No current facility-administered medications on file prior to visit.     Allergies  Allergen Reactions  . Lisinopril     Social History   Socioeconomic History  . Marital status: Single    Spouse name: Not on file  . Number of children: Not on file  . Years of education: Not on file  . Highest education level: Not on file  Occupational History  . Not on file  Social Needs  . Financial resource strain: Not on file  . Food insecurity:    Worry: Not on file    Inability: Not on file  . Transportation needs:    Medical: Not on file    Non-medical: Not on file  Tobacco Use  . Smoking status: Current Every Day Smoker    Packs/day: 0.50    Types: Cigarettes  . Smokeless tobacco: Never Used  Substance and Sexual Activity  . Alcohol use: Yes    Comment: seldom  . Drug use: No    Types: Cocaine    Comment: clean x 60 days on 11/28/15  . Sexual activity: Yes  Lifestyle  . Physical activity:    Days per week: Not on file    Minutes per session: Not on file  . Stress: Not on file  Relationships  . Social connections:    Talks on phone: Not on file    Gets together: Not on file    Attends religious service: Not on file    Active member of club or organization: Not on file    Attends meetings of clubs or organizations: Not on file    Relationship status: Not on file  . Intimate partner violence:    Fear of current or ex partner: Not on file    Emotionally abused: Not on file    Physically abused: Not on file    Forced sexual activity: Not on file  Other Topics Concern  . Not on file  Social History Narrative  . Not on file    No family history on file.  Past Surgical History:  Procedure Laterality Date  . ABDOMINAL HYSTERECTOMY    . GASTRIC BYPASS  2001    ROS: Review of Systems Negative except as above.     PHYSICAL EXAM: BP (!) 141/80   Pulse 88   Temp 98.5 F (36.9 C) (Oral)   Resp 16   Wt 237 lb 9.6 oz (107.8 kg)   SpO2 95%   BMI 38.35 kg/m   Physical Exam  General appearance - alert, well appearing, and in no distress Mental status - alert, oriented to person, place, and time Neck - supple, no significant adenopathy Chest - clear to auscultation, no wheezes, rales or rhonchi, symmetric air entry  Heart - normal rate, regular rhythm, normal S1, S2, no murmurs, rubs, clicks or gallops Extremities - peripheral pulses normal, no pedal edema, no clubbing or cyanosis  Results for orders placed or performed in visit on 04/24/18  Microalbumin / creatinine urine ratio  Result Value Ref Range   Creatinine, Urine 366.4 Not Estab. mg/dL   Microalbumin, Urine 20.4 Not Estab. ug/mL   Microalb/Creat Ratio 5.6 0.0 - 30.0 mg/g creat  VITAMIN D 25 Hydroxy (Vit-D Deficiency, Fractures)  Result Value Ref Range   Vit D, 25-Hydroxy 27.7 (L) 30.0 - 100.0 ng/mL  Hemoglobin A1c  Result Value Ref Range   Hgb A1c MFr Bld 5.8 (H) 4.8 - 5.6 %   Est. average glucose Bld gHb Est-mCnc 120 mg/dL  POCT glucose (manual entry)  Result Value Ref Range   POC Glucose 189 (A) 70 - 99 mg/dl  Urine cytology ancillary only  Result Value Ref Range   Chlamydia Negative    Neisseria gonorrhea Negative    Trichomonas Negative    ASSESSMENT AND PLAN: 1. Pre-diabetes Continue metformin.  Encourage healthy eating habits and regular exercise. - POCT glucose (manual entry) - Microalbumin / creatinine urine ratio - Hemoglobin A1c  2. Essential hypertension Not at goal.  She has not taken her medicines as yet for today.  She will take them when she returns home.  3. Polydipsia 4. Overactive bladder Patient is now willing to give Vesicare or try - solifenacin (VESICARE) 5 MG tablet; Take 1 tablet (5 mg total) by mouth daily.  Dispense: 30 tablet; Refill: 2  5. Housing situation unstable Will complete FL to  form for her 6. Tobacco dependence Advised to quit.  She will use the nicotine gum when she is ready to give a trial of quitting   7. Vitamin D deficiency Patient requested we check a vitamin D level.  She is currently taking high-dose vitamin D. - VITAMIN D 25 Hydroxy (Vit-D Deficiency, Fractures)  8. Vaginal discharge - Urine cytology ancillary only      Patient was given the opportunity to ask questions.  Patient verbalized understanding of the plan and was able to repeat key elements of the plan.   Orders Placed This Encounter  Procedures  . Microalbumin / creatinine urine ratio  . VITAMIN D 25 Hydroxy (Vit-D Deficiency, Fractures)  . Hemoglobin A1c  . POCT glucose (manual entry)     Requested Prescriptions   Signed Prescriptions Disp Refills  . solifenacin (VESICARE) 5 MG tablet 30 tablet 2    Sig: Take 1 tablet (5 mg total) by mouth daily.    Return in about 4 months (around 08/23/2018).  Karle Plumber, MD, FACP

## 2018-04-25 LAB — MICROALBUMIN / CREATININE URINE RATIO
Creatinine, Urine: 366.4 mg/dL
Microalb/Creat Ratio: 5.6 mg/g creat (ref 0.0–30.0)
Microalbumin, Urine: 20.4 ug/mL

## 2018-04-25 LAB — HEMOGLOBIN A1C
Est. average glucose Bld gHb Est-mCnc: 120 mg/dL
Hgb A1c MFr Bld: 5.8 % — ABNORMAL HIGH (ref 4.8–5.6)

## 2018-04-25 LAB — URINE CYTOLOGY ANCILLARY ONLY
CHLAMYDIA, DNA PROBE: NEGATIVE
NEISSERIA GONORRHEA: NEGATIVE
TRICH (WINDOWPATH): NEGATIVE

## 2018-04-25 LAB — VITAMIN D 25 HYDROXY (VIT D DEFICIENCY, FRACTURES): VIT D 25 HYDROXY: 27.7 ng/mL — AB (ref 30.0–100.0)

## 2018-04-28 ENCOUNTER — Other Ambulatory Visit: Payer: Self-pay | Admitting: Internal Medicine

## 2018-04-28 LAB — URINE CYTOLOGY ANCILLARY ONLY
Bacterial vaginitis: POSITIVE — AB
Candida vaginitis: NEGATIVE

## 2018-04-28 MED ORDER — METRONIDAZOLE 500 MG PO TABS: 500.0000 mg | ORAL_TABLET | Freq: Two times a day (BID) | ORAL | 0 refills | Status: DC

## 2018-05-05 ENCOUNTER — Telehealth: Payer: Self-pay | Admitting: Internal Medicine

## 2018-05-05 MED ORDER — OXYBUTYNIN CHLORIDE 5 MG PO TABS: 5.0000 mg | ORAL_TABLET | Freq: Two times a day (BID) | ORAL | 1 refills | Status: DC

## 2018-05-05 NOTE — Addendum Note (Signed)
Addended by: Karle Plumber B on: 05/05/2018 08:39 PM   Modules accepted: Orders

## 2018-05-05 NOTE — Telephone Encounter (Signed)
Will forward to pcp  The FL2 form has been faxed. Pt can pickup the original form up front

## 2018-05-05 NOTE — Telephone Encounter (Signed)
Patient called to get an update for her housing paperwork [facility]). Patient would also like an update regarding medication for the bladder (did not specify medication). She says pharmacy has sent a fax for medication substitution due to it not being covered under the insurance. Please follow up with patient.

## 2018-05-07 NOTE — Telephone Encounter (Signed)
Contacted pt and made aware of rx changed and form being up front

## 2018-06-09 ENCOUNTER — Ambulatory Visit
Admission: RE | Admit: 2018-06-09 | Discharge: 2018-06-09 | Disposition: A | Discharge status: Home / Self Care | Payer: Medicare HMO | Source: Ambulatory Visit | Attending: Internal Medicine | Admitting: Internal Medicine

## 2018-06-09 DIAGNOSIS — Z1231 Encounter for screening mammogram for malignant neoplasm of breast: Secondary | ICD-10-CM

## 2018-06-09 HISTORY — DX: Personal history of irradiation: Z92.3

## 2018-06-10 ENCOUNTER — Other Ambulatory Visit: Payer: Self-pay | Admitting: Internal Medicine

## 2018-06-10 DIAGNOSIS — F330 Major depressive disorder, recurrent, mild: Secondary | ICD-10-CM

## 2018-06-10 DIAGNOSIS — R7303 Prediabetes: Secondary | ICD-10-CM

## 2018-06-24 ENCOUNTER — Other Ambulatory Visit: Payer: Self-pay | Admitting: Internal Medicine

## 2018-07-01 ENCOUNTER — Other Ambulatory Visit: Payer: Self-pay | Admitting: Internal Medicine

## 2018-07-01 DIAGNOSIS — M797 Fibromyalgia: Secondary | ICD-10-CM

## 2018-07-02 ENCOUNTER — Ambulatory Visit (INDEPENDENT_AMBULATORY_CARE_PROVIDER_SITE_OTHER): Payer: Medicare HMO | Admitting: Pulmonary Disease

## 2018-07-02 ENCOUNTER — Other Ambulatory Visit: Payer: Self-pay | Admitting: Internal Medicine

## 2018-07-02 ENCOUNTER — Ambulatory Visit (INDEPENDENT_AMBULATORY_CARE_PROVIDER_SITE_OTHER)
Admission: RE | Admit: 2018-07-02 | Discharge: 2018-07-02 | Disposition: A | Discharge status: Home / Self Care | Payer: Medicare HMO | Source: Ambulatory Visit | Attending: Pulmonary Disease | Admitting: Pulmonary Disease

## 2018-07-02 ENCOUNTER — Encounter: Payer: Self-pay | Admitting: Pulmonary Disease

## 2018-07-02 VITALS — BP 120/74 | HR 107 | Ht 65.25 in | Wt 237.0 lb

## 2018-07-02 DIAGNOSIS — J42 Unspecified chronic bronchitis: Secondary | ICD-10-CM | POA: Diagnosis not present

## 2018-07-02 DIAGNOSIS — R0602 Shortness of breath: Secondary | ICD-10-CM | POA: Diagnosis not present

## 2018-07-02 DIAGNOSIS — F330 Major depressive disorder, recurrent, mild: Secondary | ICD-10-CM

## 2018-07-02 DIAGNOSIS — R05 Cough: Secondary | ICD-10-CM | POA: Diagnosis not present

## 2018-07-02 DIAGNOSIS — G4733 Obstructive sleep apnea (adult) (pediatric): Secondary | ICD-10-CM | POA: Diagnosis not present

## 2018-07-02 DIAGNOSIS — R7303 Prediabetes: Secondary | ICD-10-CM

## 2018-07-02 MED ORDER — VENLAFAXINE HCL ER 150 MG PO CP24: ORAL_CAPSULE | ORAL | 0 refills | Status: DC

## 2018-07-02 MED ORDER — VENLAFAXINE HCL ER 75 MG PO CP24: ORAL_CAPSULE | ORAL | 1 refills | Status: DC

## 2018-07-02 MED ORDER — METFORMIN HCL 500 MG PO TABS: 500.0000 mg | ORAL_TABLET | Freq: Every day | ORAL | 1 refills | Status: DC

## 2018-07-02 MED ORDER — VENLAFAXINE HCL ER 150 MG PO CP24: ORAL_CAPSULE | ORAL | 1 refills | Status: DC

## 2018-07-02 MED ORDER — LEVOFLOXACIN 750 MG PO TABS: 750.0000 mg | ORAL_TABLET | Freq: Every day | ORAL | 0 refills | Status: DC

## 2018-07-02 MED ORDER — PREDNISONE 10 MG PO TABS: 10.0000 mg | ORAL_TABLET | Freq: Two times a day (BID) | ORAL | 0 refills | Status: DC

## 2018-07-02 NOTE — Patient Instructions (Signed)
History of obstructive sleep apnea COPD with exacerbation  Levaquin 750 p.o. daily for 5 days Prednisone 10 p.o. twice daily for 5 days  We will obtain a chest x-ray Obtain a pulmonary function study when the symptoms are better  Home sleep study  I will see you back in the office in about 3 months I encourage you to continue to work on staying off cigarettes   Sleep Apnea Sleep apnea is a condition in which breathing pauses or becomes shallow during sleep. Episodes of sleep apnea usually last 10 seconds or longer, and they may occur as many as 20 times an hour. Sleep apnea disrupts your sleep and keeps your body from getting the rest that it needs. This condition can increase your risk of certain health problems, including:  Heart attack.  Stroke.  Obesity.  Diabetes.  Heart failure.  Irregular heartbeat. There are three kinds of sleep apnea:  Obstructive sleep apnea. This kind is caused by a blocked or collapsed airway.  Central sleep apnea. This kind happens when the part of the brain that controls breathing does not send the correct signals to the muscles that control breathing.  Mixed sleep apnea. This is a combination of obstructive and central sleep apnea. What are the causes? The most common cause of this condition is a collapsed or blocked airway. An airway can collapse or become blocked if:  Your throat muscles are abnormally relaxed.  Your tongue and tonsils are larger than normal.  You are overweight.  Your airway is smaller than normal. What increases the risk? This condition is more likely to develop in people who:  Are overweight.  Smoke.  Have a smaller than normal airway.  Are elderly.  Are female.  Drink alcohol.  Take sedatives or tranquilizers.  Have a family history of sleep apnea. What are the signs or symptoms? Symptoms of this condition include:  Trouble staying asleep.  Daytime sleepiness and  tiredness.  Irritability.  Loud snoring.  Morning headaches.  Trouble concentrating.  Forgetfulness.  Decreased interest in sex.  Unexplained sleepiness.  Mood swings.  Personality changes.  Feelings of depression.  Waking up often during the night to urinate.  Dry mouth.  Sore throat. How is this diagnosed? This condition may be diagnosed with:  A medical history.  A physical exam.  A series of tests that are done while you are sleeping (sleep study). These tests are usually done in a sleep lab, but they may also be done at home. How is this treated? Treatment for this condition aims to restore normal breathing and to ease symptoms during sleep. It may involve managing health issues that can affect breathing, such as high blood pressure or obesity. Treatment may include:  Sleeping on your side.  Using a decongestant if you have nasal congestion.  Avoiding the use of depressants, including alcohol, sedatives, and narcotics.  Losing weight if you are overweight.  Making changes to your diet.  Quitting smoking.  Using a device to open your airway while you sleep, such as: ? An oral appliance. This is a custom-made mouthpiece that shifts your lower jaw forward. ? A continuous positive airway pressure (CPAP) device. This device delivers oxygen to your airway through a mask. ? A nasal expiratory positive airway pressure (EPAP) device. This device has valves that you put into each nostril. ? A bi-level positive airway pressure (BPAP) device. This device delivers oxygen to your airway through a mask.  Surgery if other treatments do not work.  During surgery, excess tissue is removed to create a wider airway. It is important to get treatment for sleep apnea. Without treatment, this condition can lead to:  High blood pressure.  Coronary artery disease.  (Men) An inability to achieve or maintain an erection (impotence).  Reduced thinking abilities. Follow these  instructions at home:  Make any lifestyle changes that your health care provider recommends.  Eat a healthy, well-balanced diet.  Take over-the-counter and prescription medicines only as told by your health care provider.  Avoid using depressants, including alcohol, sedatives, and narcotics.  Take steps to lose weight if you are overweight.  If you were given a device to open your airway while you sleep, use it only as told by your health care provider.  Do not use any tobacco products, such as cigarettes, chewing tobacco, and e-cigarettes. If you need help quitting, ask your health care provider.  Keep all follow-up visits as told by your health care provider. This is important. Contact a health care provider if:  The device that you received to open your airway during sleep is uncomfortable or does not seem to be working.  Your symptoms do not improve.  Your symptoms get worse. Get help right away if:  You develop chest pain.  You develop shortness of breath.  You develop discomfort in your back, arms, or stomach.  You have trouble speaking.  You have weakness on one side of your body.  You have drooping in your face. These symptoms may represent a serious problem that is an emergency. Do not wait to see if the symptoms will go away. Get medical help right away. Call your local emergency services (911 in the [place] Do not drive yourself to the hospital. This information is not intended to replace advice given to you by your health care provider. Make sure you discuss any questions you have with your health care provider. Document Released: 06/01/2002 Document Revised: 01/07/2017 Document Reviewed: 03/21/2015 Elsevier Interactive Patient Education  2019 [employer].

## 2018-07-02 NOTE — Addendum Note (Signed)
Addended by: Parke Poisson E on: 07/02/2018 11:00 AM   Modules accepted: Orders

## 2018-07-02 NOTE — Progress Notes (Signed)
Rachel Woodard    [id number]    1951/02/18  Primary Care Physician:Johnson, Dalbert Batman, MD  Referring Physician: Ladell Pier, MD [address] [place], [place] [address]  Chief complaint:   Patient with a history of COPD Presenting with shortness of breath, cough with mucus production Previous diagnosis of obstructive sleep apnea-not on treatment  HPI:  Has been coughing with shortness of breath for a few days now Yellow-green phlegm Hemoptysis No chest pains or chest discomfort  Had a sleep study in 2018 showing moderate obstructive sleep apnea Was not started on any treatment then  Active smoker-she is expecting a nicotine patches and is willing and planning on quitting  She does have shortness of breath, wheezing, no chest pain or discomfort  No pertinent occupational history  Outpatient Encounter Medications as of 07/02/2018  Medication Sig  . amLODipine (NORVASC) 10 MG tablet Take 1 tablet (10 mg total) by mouth daily.  . carvedilol (COREG) 3.125 MG tablet Take 1 tablet (3.125 mg total) by mouth 2 (two) times daily with a meal.  . cholecalciferol (VITAMIN D) 1000 units tablet Take 5,000 Units by mouth daily.  . hydrochlorothiazide (HYDRODIURIL) 12.5 MG tablet Take 1 tablet (12.5 mg total) by mouth daily.  . metFORMIN (GLUCOPHAGE) 500 MG tablet TAKE 1 TABLET (500 MG TOTAL) BY MOUTH DAILY WITH BREAKFAST.  . methocarbamol (ROBAXIN) 500 MG tablet Take 1 tablet (500 mg total) by mouth every 8 (eight) hours as needed for muscle spasms.  Marland Kitchen oxybutynin (DITROPAN) 5 MG tablet TAKE 1 TABLET (5 MG TOTAL) BY MOUTH 2 (TWO) TIMES DAILY.  . valACYclovir (VALTREX) 500 MG tablet Take 1 tablet (500 mg total) by mouth 2 (two) times daily.  Marland Kitchen venlafaxine XR (EFFEXOR-XR) 150 MG 24 hr capsule TAKE 1 CAPSULE (150 MG TOTAL) BY MOUTH DAILY WITH BREAKFAST  (TAKE WITH 75MG  TO EQUAL 225MG  DAILY)  . venlafaxine XR (EFFEXOR-XR) 75 MG 24 hr capsule TAKE 1 CAPSULE DAILY WITH  BREAKFAST. TO MAKE A TOTAL OF 225 MG (TAKE WITH 150 MG)  . gabapentin (NEURONTIN) 300 MG capsule Take 1 capsule (300 mg total) by mouth daily. (Patient not taking: Reported on 04/24/2018)  . [DISCONTINUED] metroNIDAZOLE (FLAGYL) 500 MG tablet Take 1 tablet (500 mg total) by mouth 2 (two) times daily. (Patient not taking: Reported on 07/02/2018)  . [DISCONTINUED] naproxen (NAPROSYN) 500 MG tablet Take 1 tablet (500 mg total) by mouth 2 (two) times daily with a meal. Prn pain (Patient not taking: Reported on 07/02/2018)  . [DISCONTINUED] Vitamin D, Ergocalciferol, (DRISDOL) 50000 units CAPS capsule Take 1 capsule (50,000 Units total) by mouth every 7 (seven) days. (Patient not taking: Reported on 07/02/2018)   No facility-administered encounter medications on file as of 07/02/2018.     Allergies as of 07/02/2018 - Review Complete 07/02/2018  Allergen Reaction Noted  . Lisinopril  11/28/2015    Past Medical History:  Diagnosis Date  . Anxiety   . Arthritis   . Breast cancer ([place])   . Depression   . Fibromyalgia   . Hypertension   . Personal history of radiation therapy 2013  . PTSD (post-traumatic stress disorder)     Past Surgical History:  Procedure Laterality Date  . ABDOMINAL HYSTERECTOMY  2018  . BREAST LUMPECTOMY Right 2013  . GASTRIC BYPASS  2001    History reviewed. No pertinent family history.  Social History   Socioeconomic History  . Marital status: Single    Spouse  name: Not on file  . Number of children: Not on file  . Years of education: Not on file  . Highest education level: Not on file  Occupational History  . Not on file  Social Needs  . Financial resource strain: Not on file  . Food insecurity:    Worry: Not on file    Inability: Not on file  . Transportation needs:    Medical: Not on file    Non-medical: Not on file  Tobacco Use  . Smoking status: Current Every Day Smoker    Packs/day: 0.50    Types: Cigarettes  . Smokeless tobacco: Never Used    Substance and Sexual Activity  . Alcohol use: Yes    Comment: seldom  . Drug use: No    Types: Cocaine    Comment: clean x 60 days on 11/28/15  . Sexual activity: Yes  Lifestyle  . Physical activity:    Days per week: Not on file    Minutes per session: Not on file  . Stress: Not on file  Relationships  . Social connections:    Talks on phone: Not on file    Gets together: Not on file    Attends religious service: Not on file    Active member of club or organization: Not on file    Attends meetings of clubs or organizations: Not on file    Relationship status: Not on file  . Intimate partner violence:    Fear of current or ex partner: Not on file    Emotionally abused: Not on file    Physically abused: Not on file    Forced sexual activity: Not on file  Other Topics Concern  . 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   HENT: Negative.   Eyes: Negative.   Respiratory: Positive for apnea, cough and shortness of breath.   Cardiovascular: Negative.   Gastrointestinal: Negative.   Psychiatric/Behavioral: Positive for sleep disturbance.    Vitals:   07/02/18 1030  BP: 120/74  Pulse: (!) 107  SpO2: 97%   Physical Exam  Constitutional: She is oriented to person, place, and time. She appears well-developed and well-nourished.  HENT:  Head: Normocephalic and atraumatic.  Eyes: Pupils are equal, round, and reactive to light. Conjunctivae and EOM are normal. Right eye exhibits no discharge. Left eye exhibits no discharge.  Neck: Normal range of motion. Neck supple. No tracheal deviation present. No thyromegaly present.  Cardiovascular: Normal rate and regular rhythm.  Pulmonary/Chest: Effort normal and breath sounds normal. No respiratory distress. She has no wheezes.  Abdominal: Soft. Bowel sounds are normal. She exhibits no distension. There is  no abdominal tenderness.  Musculoskeletal: Normal range of motion.        General: No deformity or edema.  Neurological: She is alert and oriented to person, place, and time.  Skin: Skin is warm and dry.  Psychiatric: She has a normal mood and affect.   Data Reviewed:  Polysomnogram from 2018 reviewed revealing moderate obstructive sleep apnea with recommendation to start on CPAP of 14  Assessment:   COPD with exacerbation  Moderate obstructive sleep apnea  Active smoker -She is planning on quitting, expecting a prescription of nicotine  Shortness of breath likely related to COPD  Plan/Recommendations:  Plan on chest x-ray, PFT, sleep study  Pathophysiology of sleep  disordered breathing discussed with the patient Treatment options discussed with the patient  Prescription for Levaquin 750 p.o. daily for 5 days Prednisone 10 p.o. twice daily for 5 days  Encouraged to definitely stay off cigarettes  I will see her back in the office in about 3 months Courage to call with any significant concerns   Sherrilyn Rist MD [facility] 07/02/2018, 10:36 AM  CC: Ladell Pier, MD

## 2018-07-24 DIAGNOSIS — R945 Abnormal results of liver function studies: Secondary | ICD-10-CM | POA: Diagnosis not present

## 2018-07-24 DIAGNOSIS — R194 Change in bowel habit: Secondary | ICD-10-CM | POA: Diagnosis not present

## 2018-07-24 DIAGNOSIS — Z9884 Bariatric surgery status: Secondary | ICD-10-CM | POA: Diagnosis not present

## 2018-07-24 DIAGNOSIS — R1011 Right upper quadrant pain: Secondary | ICD-10-CM | POA: Diagnosis not present

## 2018-07-24 DIAGNOSIS — R1310 Dysphagia, unspecified: Secondary | ICD-10-CM | POA: Diagnosis not present

## 2018-07-24 DIAGNOSIS — K219 Gastro-esophageal reflux disease without esophagitis: Secondary | ICD-10-CM | POA: Diagnosis not present

## 2018-07-29 ENCOUNTER — Other Ambulatory Visit: Payer: Self-pay | Admitting: Gastroenterology

## 2018-07-29 DIAGNOSIS — R1011 Right upper quadrant pain: Secondary | ICD-10-CM

## 2018-07-31 DIAGNOSIS — G4733 Obstructive sleep apnea (adult) (pediatric): Secondary | ICD-10-CM

## 2018-08-01 DIAGNOSIS — G4733 Obstructive sleep apnea (adult) (pediatric): Secondary | ICD-10-CM | POA: Diagnosis not present

## 2018-08-02 ENCOUNTER — Encounter: Payer: Self-pay | Admitting: Podiatry

## 2018-08-05 ENCOUNTER — Ambulatory Visit
Admission: RE | Admit: 2018-08-05 | Discharge: 2018-08-05 | Disposition: A | Discharge status: Home / Self Care | Payer: Medicare HMO | Source: Ambulatory Visit | Attending: Gastroenterology | Admitting: Gastroenterology

## 2018-08-05 DIAGNOSIS — N281 Cyst of kidney, acquired: Secondary | ICD-10-CM | POA: Diagnosis not present

## 2018-08-05 DIAGNOSIS — K8020 Calculus of gallbladder without cholecystitis without obstruction: Secondary | ICD-10-CM | POA: Diagnosis not present

## 2018-08-05 DIAGNOSIS — R1011 Right upper quadrant pain: Secondary | ICD-10-CM

## 2018-08-11 ENCOUNTER — Encounter: Payer: Self-pay | Admitting: Internal Medicine

## 2018-08-11 NOTE — Progress Notes (Signed)
Patient seen by [facility] Dr. Alessandra Bevels on 07/24/2018.  Plan is for EGD with dilation due to esophageal dysphasia.  Plan for colonoscopy for colon cancer screening.  Patient with abnormal LFT with elevation of ALT of 57.  Labs revealed positive hepatitis B core antibody and reactive hepatitis B surface antibody these lab results revealed that she is immune to hepatitis B.  Patient had positive smooth muscle antibody and positive antimitochondrial antibody raising the possibility of overlap autoimmune hepatitis and PBC.  Plan is to repeat AMA, ASMA along with immunoglobulin G level in 4 months.

## 2018-08-12 DIAGNOSIS — K222 Esophageal obstruction: Secondary | ICD-10-CM | POA: Diagnosis not present

## 2018-08-12 DIAGNOSIS — Z980 Intestinal bypass and anastomosis status: Secondary | ICD-10-CM | POA: Diagnosis not present

## 2018-08-12 DIAGNOSIS — R1314 Dysphagia, pharyngoesophageal phase: Secondary | ICD-10-CM | POA: Diagnosis not present

## 2018-08-12 DIAGNOSIS — K219 Gastro-esophageal reflux disease without esophagitis: Secondary | ICD-10-CM | POA: Diagnosis not present

## 2018-08-12 DIAGNOSIS — K449 Diaphragmatic hernia without obstruction or gangrene: Secondary | ICD-10-CM | POA: Diagnosis not present

## 2018-08-12 DIAGNOSIS — K2930 Chronic superficial gastritis without bleeding: Secondary | ICD-10-CM | POA: Diagnosis not present

## 2018-08-12 DIAGNOSIS — R194 Change in bowel habit: Secondary | ICD-10-CM | POA: Diagnosis not present

## 2018-08-12 DIAGNOSIS — K648 Other hemorrhoids: Secondary | ICD-10-CM | POA: Diagnosis not present

## 2018-08-13 ENCOUNTER — Ambulatory Visit (INDEPENDENT_AMBULATORY_CARE_PROVIDER_SITE_OTHER): Payer: Medicare HMO

## 2018-08-13 ENCOUNTER — Ambulatory Visit (INDEPENDENT_AMBULATORY_CARE_PROVIDER_SITE_OTHER): Payer: Medicare HMO | Admitting: Orthopaedic Surgery

## 2018-08-13 DIAGNOSIS — M25562 Pain in left knee: Secondary | ICD-10-CM | POA: Diagnosis not present

## 2018-08-13 DIAGNOSIS — M1712 Unilateral primary osteoarthritis, left knee: Secondary | ICD-10-CM | POA: Diagnosis not present

## 2018-08-13 DIAGNOSIS — G8929 Other chronic pain: Secondary | ICD-10-CM

## 2018-08-13 NOTE — Progress Notes (Signed)
Office Visit Note   Patient: Rachel Woodard           Date of Birth: 07-Dec-1950           MRN: [id number] Visit Date: 08/13/2018              Requested by: Ladell Pier, MD [address] [place], [place] [address] PCP: Ladell Pier, MD   Assessment & Plan: Visit Diagnoses:  1. Unilateral primary osteoarthritis, left knee   2. Chronic pain of left knee     Plan: Impression is end-stage left knee degenerative joint disease.  Patient has failed extensive conservative treatment over the last couple years.  She continues to have severe difficulty with ADLs and quality of life.  She now wishes to proceed with a left total knee replacement after full discussion of risks and benefits and rehab and recovery.  She denies a history of nickel allergy or DVT.  She is not allergic to aspirin. Total face to face encounter time was greater than 25 minutes and over half of this time was spent in counseling and/or coordination of care.  Follow-Up Instructions: Return if symptoms worsen or fail to improve.   Orders:  Orders Placed This Encounter  Procedures  . XR KNEE 3 VIEW LEFT   No orders of the defined types were placed in this encounter.     Procedures: No procedures performed   Clinical Data: No additional findings.   Subjective: Chief Complaint  Patient presents with  . Left Knee - Pain    Rachel Woodard is a 68 year old female who comes in with chronic left knee pain.  I saw her in 2017 for this condition and since then this has gotten progressively worse.  She is a recovering crack cocaine addict and she is currently in remission.  She has tried multiple cortisone injections and viscosupplementation reflexogenic's without any significant relief.  She is prediabetic and she currently lives with her boyfriend.   Review of Systems  Constitutional: Negative.   HENT: Negative.   Eyes: Negative.   Respiratory: Negative.   Cardiovascular: Negative.   Endocrine:  Negative.   Musculoskeletal: Negative.   Neurological: Negative.   Hematological: Negative.   Psychiatric/Behavioral: Negative.   All other systems reviewed and are negative.    Objective: Vital Signs: There were no vitals taken for this visit.  Physical Exam Vitals signs and nursing note reviewed.  Constitutional:      Appearance: She is well-developed.  Pulmonary:     Effort: Pulmonary effort is normal.  Skin:    General: Skin is warm.     Capillary Refill: Capillary refill takes less than 2 seconds.  Neurological:     Mental Status: She is alert and oriented to person, place, and time.  Psychiatric:        Behavior: Behavior normal.        Thought Content: Thought content normal.        Judgment: Judgment normal.     Ortho Exam Left knee exam shows a trace joint effusion.  Collaterals and cruciates are stable.  2+ patellofemoral crepitus. Specialty Comments:  No specialty comments available.  Imaging: Xr Knee 3 View Left  Result Date: 08/13/2018 Advanced tricompartment degenerative joint disease    PMFS History: Patient Active Problem List   Diagnosis Date Noted  . Genital herpes simplex 10/29/2017  . Vitamin D deficiency 10/29/2017  . Class 2 obesity due to excess calories without serious comorbidity with body mass index (BMI)  of 38.0 to 38.9 in adult 10/29/2017  . Essential hypertension 10/29/2017  . Pre-diabetes 02/14/2017  . Fibromyalgia 02/14/2017  . Substance abuse in remission ([place]) 02/14/2017  . Anxiety and depression 02/14/2017  . HX: breast cancer 02/14/2017  . Urge incontinence 11/13/2016  . Unilateral primary osteoarthritis, left knee 06/14/2016  . PMB (postmenopausal bleeding) 04/02/2016  . GERD (gastroesophageal reflux disease) 11/02/2015  . Angioedema 10/24/2013  . Tobacco abuse 10/24/2013  . DCIS (ductal carcinoma in situ) of breast 04/07/2013  . Depression 04/07/2013   Past Medical History:  Diagnosis Date  . Anxiety   . Arthritis    . Breast cancer ([place])   . Depression   . Fibromyalgia   . Hypertension   . Personal history of radiation therapy 2013  . PTSD (post-traumatic stress disorder)     No family history on file.  Past Surgical History:  Procedure Laterality Date  . ABDOMINAL HYSTERECTOMY  2018  . BREAST LUMPECTOMY Right 2013  . GASTRIC BYPASS  2001   Social History   Occupational History  . Not on file  Tobacco Use  . Smoking status: Current Every Day Smoker    Packs/day: 0.50    Types: Cigarettes  . Smokeless tobacco: Never Used  Substance and Sexual Activity  . Alcohol use: Yes    Comment: seldom  . Drug use: No    Types: Cocaine    Comment: clean x 60 days on 11/28/15  . Sexual activity: Yes

## 2018-08-14 ENCOUNTER — Telehealth: Payer: Self-pay | Admitting: Pulmonary Disease

## 2018-08-14 DIAGNOSIS — G4733 Obstructive sleep apnea (adult) (pediatric): Secondary | ICD-10-CM

## 2018-08-14 NOTE — Telephone Encounter (Signed)
Dr. Ander Slade has reviewed the home sleep test this showed Mild OSA.   Recommendations   Treatment options are CPAP with the settings auto 5 to 15.    Weight loss measures .   Advise against driving while sleepy & against medication with sedative side effects.    Make appointment for 3 months for compliance with download with Dr. Ander Slade.

## 2018-08-18 DIAGNOSIS — K2930 Chronic superficial gastritis without bleeding: Secondary | ICD-10-CM | POA: Diagnosis not present

## 2018-08-20 ENCOUNTER — Encounter: Payer: Self-pay | Admitting: Podiatry

## 2018-08-20 ENCOUNTER — Ambulatory Visit: Payer: Medicare HMO

## 2018-08-20 ENCOUNTER — Ambulatory Visit (INDEPENDENT_AMBULATORY_CARE_PROVIDER_SITE_OTHER): Payer: Medicare HMO | Admitting: Podiatry

## 2018-08-20 DIAGNOSIS — M79676 Pain in unspecified toe(s): Secondary | ICD-10-CM | POA: Diagnosis not present

## 2018-08-20 DIAGNOSIS — B351 Tinea unguium: Secondary | ICD-10-CM

## 2018-08-21 DIAGNOSIS — R740 Nonspecific elevation of levels of transaminase and lactic acid dehydrogenase [LDH]: Secondary | ICD-10-CM | POA: Diagnosis not present

## 2018-08-21 DIAGNOSIS — R899 Unspecified abnormal finding in specimens from other organs, systems and tissues: Secondary | ICD-10-CM | POA: Diagnosis not present

## 2018-08-21 DIAGNOSIS — R945 Abnormal results of liver function studies: Secondary | ICD-10-CM | POA: Diagnosis not present

## 2018-08-24 NOTE — Progress Notes (Signed)
   SUBJECTIVE Patient presents to office today complaining of elongated, thickened nails that cause pain while ambulating in shoes. She is unable to trim her own nails. She notes thickening and discoloration of the 3rd toenails of bilateral feet that has been ongoing for years. Applying pressure to the nails causes pain. She states she had the nails cultured two years ago and the results were negative for fungus. Patient is here for further evaluation and treatment.  Past Medical History:  Diagnosis Date  . Anxiety   . Arthritis   . Breast cancer ([place])   . Depression   . Fibromyalgia   . Hypertension   . Personal history of radiation therapy 2013  . PTSD (post-traumatic stress disorder)     OBJECTIVE General Patient is awake, alert, and oriented x 3 and in no acute distress. Derm Skin is dry and supple bilateral. Negative open lesions or macerations. Remaining integument unremarkable. Nails are tender, long, thickened and dystrophic with subungual debris, consistent with onychomycosis, 1-5 bilateral. No signs of infection noted. Vasc  DP and PT pedal pulses palpable bilaterally. Temperature gradient within normal limits.  Neuro Epicritic and protective threshold sensation grossly intact bilaterally.  Musculoskeletal Exam No symptomatic pedal deformities noted bilateral. Muscular strength within normal limits.  ASSESSMENT 1. Onychodystrophic nails 1-5 bilateral with hyperkeratosis of nails.  2. Onychomycosis of nail due to dermatophyte bilateral 3. Pain in foot bilateral  PLAN OF CARE 1. Patient evaluated today.  2. Instructed to maintain good pedal hygiene and foot care.  3. Mechanical debridement of nails 1-5 bilaterally performed using a nail nipper. Filed with dremel without incident.  4. Return to clinic in 3 mos.    Edrick Kins, DPM [facility]  Dr. Edrick Kins, [place]                                        [place], [place] [address]                 Office [phone]  Fax [phone]

## 2018-08-25 ENCOUNTER — Telehealth: Payer: Self-pay | Admitting: Pulmonary Disease

## 2018-08-25 NOTE — Telephone Encounter (Signed)
I have called the patient and explained that we have sent the order to [facility]. She verblized understanding anf nothing further is needed at this time.

## 2018-08-26 DIAGNOSIS — Z87898 Personal history of other specified conditions: Secondary | ICD-10-CM | POA: Diagnosis not present

## 2018-08-26 DIAGNOSIS — N907 Vulvar cyst: Secondary | ICD-10-CM | POA: Diagnosis not present

## 2018-08-26 DIAGNOSIS — Z01419 Encounter for gynecological examination (general) (routine) without abnormal findings: Secondary | ICD-10-CM | POA: Diagnosis not present

## 2018-08-26 DIAGNOSIS — N898 Other specified noninflammatory disorders of vagina: Secondary | ICD-10-CM | POA: Diagnosis not present

## 2018-08-26 DIAGNOSIS — Z13820 Encounter for screening for osteoporosis: Secondary | ICD-10-CM | POA: Diagnosis not present

## 2018-08-27 DIAGNOSIS — G4733 Obstructive sleep apnea (adult) (pediatric): Secondary | ICD-10-CM | POA: Diagnosis not present

## 2018-08-27 DIAGNOSIS — K219 Gastro-esophageal reflux disease without esophagitis: Secondary | ICD-10-CM | POA: Diagnosis not present

## 2018-08-27 DIAGNOSIS — F4310 Post-traumatic stress disorder, unspecified: Secondary | ICD-10-CM | POA: Diagnosis not present

## 2018-08-27 DIAGNOSIS — I10 Essential (primary) hypertension: Secondary | ICD-10-CM | POA: Diagnosis not present

## 2018-08-27 DIAGNOSIS — R7303 Prediabetes: Secondary | ICD-10-CM | POA: Diagnosis not present

## 2018-08-27 DIAGNOSIS — J441 Chronic obstructive pulmonary disease with (acute) exacerbation: Secondary | ICD-10-CM | POA: Diagnosis not present

## 2018-08-27 DIAGNOSIS — Z923 Personal history of irradiation: Secondary | ICD-10-CM | POA: Diagnosis not present

## 2018-08-27 DIAGNOSIS — Z853 Personal history of malignant neoplasm of breast: Secondary | ICD-10-CM | POA: Diagnosis not present

## 2018-08-27 DIAGNOSIS — F3341 Major depressive disorder, recurrent, in partial remission: Secondary | ICD-10-CM | POA: Diagnosis not present

## 2018-08-28 ENCOUNTER — Other Ambulatory Visit: Payer: Self-pay | Admitting: Nurse Practitioner

## 2018-08-28 DIAGNOSIS — R5381 Other malaise: Secondary | ICD-10-CM

## 2018-09-02 DIAGNOSIS — G4733 Obstructive sleep apnea (adult) (pediatric): Secondary | ICD-10-CM | POA: Diagnosis not present

## 2018-09-11 DIAGNOSIS — N281 Cyst of kidney, acquired: Secondary | ICD-10-CM | POA: Diagnosis not present

## 2018-09-18 DIAGNOSIS — Z9884 Bariatric surgery status: Secondary | ICD-10-CM | POA: Diagnosis not present

## 2018-09-18 DIAGNOSIS — D219 Benign neoplasm of connective and other soft tissue, unspecified: Secondary | ICD-10-CM | POA: Diagnosis not present

## 2018-09-18 DIAGNOSIS — R194 Change in bowel habit: Secondary | ICD-10-CM | POA: Diagnosis not present

## 2018-09-18 DIAGNOSIS — K743 Primary biliary cirrhosis: Secondary | ICD-10-CM | POA: Diagnosis not present

## 2018-09-18 DIAGNOSIS — R945 Abnormal results of liver function studies: Secondary | ICD-10-CM | POA: Diagnosis not present

## 2018-09-18 DIAGNOSIS — R82998 Other abnormal findings in urine: Secondary | ICD-10-CM | POA: Diagnosis not present

## 2018-09-18 DIAGNOSIS — K219 Gastro-esophageal reflux disease without esophagitis: Secondary | ICD-10-CM | POA: Diagnosis not present

## 2018-09-22 DIAGNOSIS — K743 Primary biliary cirrhosis: Secondary | ICD-10-CM | POA: Diagnosis not present

## 2018-09-24 ENCOUNTER — Encounter: Payer: Self-pay | Admitting: Internal Medicine

## 2018-09-24 NOTE — Progress Notes (Signed)
Pt seen by [facility] 09/11/2018 for eval of large RT renal cyst 9.7 cm in size. Management options of observation, aspiration/sclerotherapy or surgical decortication discussed. Plan was to first do CT abd/pelvis wit contrast for further characterization first.

## 2018-09-30 DIAGNOSIS — N281 Cyst of kidney, acquired: Secondary | ICD-10-CM | POA: Diagnosis not present

## 2018-09-30 DIAGNOSIS — R1031 Right lower quadrant pain: Secondary | ICD-10-CM | POA: Diagnosis not present

## 2018-10-03 DIAGNOSIS — G4733 Obstructive sleep apnea (adult) (pediatric): Secondary | ICD-10-CM | POA: Diagnosis not present

## 2018-10-14 DIAGNOSIS — N281 Cyst of kidney, acquired: Secondary | ICD-10-CM | POA: Diagnosis not present

## 2018-10-15 ENCOUNTER — Ambulatory Visit: Payer: Self-pay | Admitting: Pulmonary Disease

## 2018-10-22 ENCOUNTER — Encounter: Payer: Self-pay | Admitting: Internal Medicine

## 2018-10-22 ENCOUNTER — Telehealth: Payer: Self-pay | Admitting: Internal Medicine

## 2018-10-22 DIAGNOSIS — N281 Cyst of kidney, acquired: Secondary | ICD-10-CM

## 2018-10-22 DIAGNOSIS — R911 Solitary pulmonary nodule: Secondary | ICD-10-CM

## 2018-10-22 NOTE — Progress Notes (Signed)
Received note from [facility] Dr. Alexis Frock.  Patient seen by him in follow-up on 10/14/2022 right renal cyst.  Patient had contrast CT done earlier this month that confirmed a large noncomplex cyst and right lower lobe pulmonary nodule measuring 1.4 cm in size.  Patient told him that she has an evaluation pending at [facility] for evaluation of the right lower lobe lung nodule.  In regards to the renal cysts, patient was given the option of observation versus aspiration or surgical decortication.  Patient opted for surveillance.  He agreed with her as the lesion is low risk and appears to be asymptomatic/minimally symptomatic.  He recommend return in 1 year with right renal ultrasound.

## 2018-10-23 NOTE — Telephone Encounter (Signed)
Patient returned call please follow up

## 2018-10-23 NOTE — Telephone Encounter (Signed)
Pt states she goes to [facility] on the 12th of June

## 2018-10-23 NOTE — Telephone Encounter (Signed)
Contacted pt and lvm asking pt to give me a call at her earliest convenience  

## 2018-10-29 ENCOUNTER — Other Ambulatory Visit: Payer: Self-pay

## 2018-10-29 ENCOUNTER — Telehealth: Payer: Self-pay | Admitting: Orthopaedic Surgery

## 2018-10-29 ENCOUNTER — Ambulatory Visit: Payer: Medicare HMO | Attending: Internal Medicine | Admitting: Internal Medicine

## 2018-10-29 ENCOUNTER — Encounter: Payer: Self-pay | Admitting: Internal Medicine

## 2018-10-29 DIAGNOSIS — R911 Solitary pulmonary nodule: Secondary | ICD-10-CM

## 2018-10-29 DIAGNOSIS — G4733 Obstructive sleep apnea (adult) (pediatric): Secondary | ICD-10-CM

## 2018-10-29 DIAGNOSIS — M1712 Unilateral primary osteoarthritis, left knee: Secondary | ICD-10-CM | POA: Diagnosis not present

## 2018-10-29 DIAGNOSIS — F17210 Nicotine dependence, cigarettes, uncomplicated: Secondary | ICD-10-CM | POA: Diagnosis not present

## 2018-10-29 DIAGNOSIS — K754 Autoimmune hepatitis: Secondary | ICD-10-CM | POA: Diagnosis not present

## 2018-10-29 DIAGNOSIS — N281 Cyst of kidney, acquired: Secondary | ICD-10-CM

## 2018-10-29 DIAGNOSIS — F330 Major depressive disorder, recurrent, mild: Secondary | ICD-10-CM | POA: Diagnosis not present

## 2018-10-29 DIAGNOSIS — F17200 Nicotine dependence, unspecified, uncomplicated: Secondary | ICD-10-CM

## 2018-10-29 NOTE — Telephone Encounter (Signed)
See message.

## 2018-10-29 NOTE — Progress Notes (Signed)
Virtual Visit via Telephone Note Due to current restrictions/limitations of in-office visits due to the COVID-19 pandemic, this scheduled clinical appointment was converted to a telehealth visit  I connected with Rachel Woodard on 10/29/18 at 3:10 p.m EDT by telephone and verified that I am speaking with the correct person using two identifiers. I am in my office.  The patient is at home.  Only the patient and myself participated in this encounter.  I discussed the limitations, risks, security and privacy concerns of performing an evaluation and management service by telephone and the availability of in person appointments. I also discussed with the patient that there may be a patient responsible charge related to this service. The patient expressed understanding and agreed to proceed.   History of Present Illness: Hxpre-DM,mdd/anxiety, substance abuse (crack cocaine) in remission, HTN, tob dep, gerd, fibromyalgia, urge incont, Vit D def.Breast CA RT 2013 ([place] with lumpectomyandXRT).  Patient last seen in October 2019.   RT renal cyst: Since last visit with me patient was found to have large right renal cyst.  Pt seen by [facility] 09/11/2018 for eval of large RT renal cyst 9.7 cm in size. Management options of observation, aspiration/sclerotherapy or surgical decortication discussed.  Patient opted for surveillance.  Dr. Lynne Logan recommends that she follow-up with him in 1 year for repeat renal ultrasound  RT lung nodule: 1.4 cm right lower lung nodule was noted as incidental finding on CAT scan that was done by her urologist.  Patient states that been present for about 10 yrs and that she was being followed by a physician in when she lived in [place].she is not sure if it was a pulmonologist. Saw pulmonary Dr. Ander Slade with [facility] on 07/31/2018.  Treated for chronic bronchitis.  Has moderate OSA; now on CPAP.  Has f/u appt with him in August.  He was not  aware of this recent finding on CT scan.  -Patient tells me that she is going to [facility] in a few weeks to be evaluated for new diagnosis of autoimmune hepatitis diagnosed by GI Dr. Alessandra Bevels.  She states that her GI doctor told her that they will evaluate the lung nodule at [facility].  Patient is not sure if she is scheduled with a liver specialist and a pulmonologist. -She continues to smoke and is not ready to quit.  Seeing GI Dr. Alessandra Bevels.  Dx with autoimmune hepatitis 08/11/2018.  States last blood work was good and told she does not need to be started on med at this time.however he has referred her to [facility].  That appointment is coming up in the next few weeks  Request referral to [facility] Dr. Erlinda Hong.  She states that she has been seeing him for the arthritis in her knee and he has recommended knee replacement.  She has decided to move forward with it.   Trying to get housing through [facility].  Needs something stating that she is not candidate for group home or assisted living so that she can be placed in stable housing  She was requesting prescription for Lamictal which she states was increased recently by her mental health provider to 100 mg/day Observations/Objective: No direct observations done  Assessment and Plan: 1. Lung nodule, solitary -Advised patient that she is at high risk for lung cancer given her long history of smoking.  It is not quite clear to me what to follow-up if any she has had regarding this lung nodule.  She went back and forth in  talking about the EGD that she had through her gastroenterologist and that he did not see anything. -I advised that I refer her to the pulmonologist who she has been seeing here and inform him of this incidental finding on recent CT scan that was done by urology.  Patient wanting to hold off stating that she has this appointment with [facility] to have it addressed.  I asked her to call me after she is seen at [facility] so that I can look at the notes on  Care Everywhere to make sure this is being addressed.  She expressed understanding  2. Tobacco dependence Patient advised to quit smoking. Discussed health risks associated with smoking including lung and other types of cancers, chronic lung diseases and CV risks.. Pt not ready to give trail of quitting.  Discussed methods to help quit including quitting cold [place], use of NRT, Chantix and Bupropion.  Less than 5-minute spent on counseling  3. Osteoarthritis of left knee, unspecified osteoarthritis type - Ambulatory referral to Orthopedic Surgery  4. Autoimmune hepatitis ([place]) Requested that she ask her GI doctor to send me his notes so that I can get up-to-date on his evaluation  5. OSA (obstructive sleep apnea) On CPAP  6. MDD (major depressive disorder), recurrent episode, mild ([place]) Plugged into behavioral health.  She will get refills on Lamictal through her behavioral health provider  7. Renal cyst, right Seen and evaluated by Dr. Tammi Klippel.  Plan is for observation/surveillance   Follow Up Instructions: Follow-up in 2 months   I discussed the assessment and treatment plan with the patient. The patient was provided an opportunity to ask questions and all were answered. The patient agreed with the plan and demonstrated an understanding of the instructions.   The patient was advised to call back or seek an in-person evaluation if the symptoms worsen or if the condition fails to improve as anticipated.  I provided 24 minutes of non-face-to-face time during this encounter.   Karle Plumber, MD

## 2018-10-29 NOTE — Telephone Encounter (Signed)
Ok we can schedule her for next available.  Thanks.

## 2018-10-29 NOTE — Telephone Encounter (Signed)
Patient would like to go ahead with surgery, please call asap

## 2018-10-31 ENCOUNTER — Encounter: Payer: Self-pay | Admitting: Internal Medicine

## 2018-11-02 DIAGNOSIS — G4733 Obstructive sleep apnea (adult) (pediatric): Secondary | ICD-10-CM | POA: Diagnosis not present

## 2018-11-04 NOTE — Telephone Encounter (Signed)
-----   Message from Jackelyn Knife, [place] sent at 11/03/2018 11:28 AM EDT ----- Please schedule pt an in person visit.

## 2018-11-04 NOTE — Telephone Encounter (Signed)
Called the pt an lvm for her to call back for an in person appointment

## 2018-11-04 NOTE — Telephone Encounter (Signed)
Called the patient and lvm

## 2018-11-05 ENCOUNTER — Telehealth: Payer: Self-pay | Admitting: Internal Medicine

## 2018-11-05 NOTE — Telephone Encounter (Signed)
Contacted pt and lvm informing pt that letter is ready for pickup and she can pick it up tomorrow

## 2018-11-11 ENCOUNTER — Other Ambulatory Visit: Payer: Self-pay

## 2018-11-11 ENCOUNTER — Encounter: Payer: Self-pay | Admitting: Internal Medicine

## 2018-11-11 ENCOUNTER — Ambulatory Visit (HOSPITAL_BASED_OUTPATIENT_CLINIC_OR_DEPARTMENT_OTHER): Payer: Medicare HMO | Admitting: Internal Medicine

## 2018-11-11 ENCOUNTER — Ambulatory Visit (HOSPITAL_COMMUNITY)
Admission: RE | Admit: 2018-11-11 | Discharge: 2018-11-11 | Disposition: A | Discharge status: Home / Self Care | Payer: Medicare HMO | Source: Ambulatory Visit | Attending: Family Medicine | Admitting: Family Medicine

## 2018-11-11 VITALS — BP 120/72 | HR 83 | Temp 98.4°F | Resp 16 | Wt 241.4 lb

## 2018-11-11 DIAGNOSIS — R9431 Abnormal electrocardiogram [ECG] [EKG]: Secondary | ICD-10-CM | POA: Diagnosis not present

## 2018-11-11 DIAGNOSIS — R001 Bradycardia, unspecified: Secondary | ICD-10-CM

## 2018-11-11 DIAGNOSIS — R600 Localized edema: Secondary | ICD-10-CM | POA: Diagnosis not present

## 2018-11-11 DIAGNOSIS — F17200 Nicotine dependence, unspecified, uncomplicated: Secondary | ICD-10-CM

## 2018-11-11 DIAGNOSIS — M79602 Pain in left arm: Secondary | ICD-10-CM

## 2018-11-11 DIAGNOSIS — E669 Obesity, unspecified: Secondary | ICD-10-CM | POA: Diagnosis not present

## 2018-11-11 DIAGNOSIS — R7303 Prediabetes: Secondary | ICD-10-CM | POA: Diagnosis not present

## 2018-11-11 DIAGNOSIS — F17210 Nicotine dependence, cigarettes, uncomplicated: Secondary | ICD-10-CM

## 2018-11-11 MED ORDER — AMLODIPINE BESYLATE 5 MG PO TABS: 5.0000 mg | ORAL_TABLET | Freq: Every day | ORAL | 1 refills | Status: DC

## 2018-11-11 MED ORDER — HYDROCHLOROTHIAZIDE 25 MG PO TABS: 25.0000 mg | ORAL_TABLET | Freq: Every day | ORAL | 1 refills | Status: DC

## 2018-11-11 NOTE — Progress Notes (Signed)
Pt states she has discomfort when walking   Pt states the swelling has been going on for 2 weeks  Pt states the discomfort is on and off

## 2018-11-11 NOTE — Patient Instructions (Addendum)
For the lower extremity swelling, we have decreased amlodipine to 5 mg daily and increase hydrochlorothiazide to 25 mg daily.  Continue to limit salt in the foods.  You can use your compression socks.  Follow a Healthy Eating Plan - You can do it! Limit sugary drinks.  Avoid sodas, sweet tea, sport or energy drinks, or fruit drinks.  Drink water, lo-fat milk, or diet drinks. Limit snack foods.   Cut back on candy, cake, cookies, chips, ice cream.  These are a special treat, only in small amounts. Eat plenty of vegetables.  Especially dark green, red, and orange vegetables. Aim for at least 3 servings a day. More is better! Include fruit in your daily diet.  Whole fruit is much healthier than fruit juice! Limit "white" bread, "white" pasta, "white" rice.   Choose "100% whole grain" products, brown or wild rice. Avoid fatty meats. Try "Meatless Monday" and choose eggs or beans one day a week.  When eating meat, choose lean meats like chicken, [place], and fish.  Grill, broil, or bake meats instead of frying, and eat poultry without the skin. Eat less salt.  Avoid frozen pizzas, frozen dinners and salty foods.  Use seasonings other than salt in cooking.  This can help blood pressure and keep you from swelling Beer, wine and liquor have calories.  If you can safely drink alcohol, limit to 1 drink per day for women, 2 drinks for men

## 2018-11-11 NOTE — Progress Notes (Signed)
Patient ID: Bridgit Eynon, female    DOB: Dec 19, 1950  MRN: [id number]  CC: Foot Swelling and Leg Swelling   Subjective: Tran Randle is a 68 y.o. female who presents for UC visit Her concerns today include:  Hxpre-DM,mdd/anxiety, sub abuse (crack cocaine) in remission, HTN, tob dep, gerd, OSA on CPAP fibromyalgia, urge incont, Vit D def, RT renal cyst, RT lung nodule, autoimmune hepatitis.Breast CA RT 2013 ([place] with lumpectomyandXRT).    Pt c/o intermittent swelling in feet and  legs x 2 wks.  More so in feet.  Worse as day progresses if she is on her feet a lot.  Has compression socks but wanted to wait and talk with me first before wearing them.   -limits salt in foods.   -no SOB/PND/orthopnea  C/o dull ache in LT arm intermittently since yesterday.  Several other episodes over past few mths.   No initiating factors.  No weakness.  Some numbness in both hands intermittently but not associated with the soreness in the LT arm; just a couple of episodes of numbness in hands.  No CP/SOB/Nausea Can last 30 mins to entire day.   Nothing makes it worse.  Better if she takes Aleve.    Obesity:  Wants to lose 60 lbs. she has made some changes in her eating habits.  She now eats less red meat: More fish and chicken.  She drinks mainly water.  Drinking herbal tea.  Eating more vegetables.  Needs to do better with eating fruits. Can get Nutrisystem for free through [facility].  Plans to start in the near future to help with wgh loss efforts.  Also has Silver sneakers.  She plans to join gym once it re-opens; close due to [place].  She tries to walk a little several times a week.  Tobacco dependence: States that she now has the nicotine gum.  She has not committed to quitting as yet so uses the gum just intermittently.   Patient Active Problem List   Diagnosis Date Noted  . MDD (major depressive disorder), recurrent episode, mild ([place]) 10/29/2018  . Autoimmune hepatitis  ([place]) 10/29/2018  . OSA (obstructive sleep apnea) 10/29/2018  . Renal cyst, right 10/22/2018  . Lung nodule, solitary 10/22/2018  . Genital herpes simplex 10/29/2017  . Vitamin D deficiency 10/29/2017  . Class 2 obesity due to excess calories without serious comorbidity with body mass index (BMI) of 38.0 to 38.9 in adult 10/29/2017  . Essential hypertension 10/29/2017  . Pre-diabetes 02/14/2017  . Fibromyalgia 02/14/2017  . Substance abuse in remission ([place]) 02/14/2017  . Anxiety and depression 02/14/2017  . HX: breast cancer 02/14/2017  . Urge incontinence 11/13/2016  . Unilateral primary osteoarthritis, left knee 06/14/2016  . PMB (postmenopausal bleeding) 04/02/2016  . GERD (gastroesophageal reflux disease) 11/02/2015  . Angioedema 10/24/2013  . Tobacco abuse 10/24/2013  . DCIS (ductal carcinoma in situ) of breast 04/07/2013  . Depression 04/07/2013     Current Outpatient Medications on File Prior to Visit  Medication Sig Dispense Refill  . carvedilol (COREG) 3.125 MG tablet Take 1 tablet (3.125 mg total) by mouth 2 (two) times daily with a meal. 180 tablet 1  . lamoTRIgine (LAMICTAL) 25 MG tablet Take 100 mg by mouth daily.     Marland Kitchen levofloxacin (LEVAQUIN) 750 MG tablet Take 1 tablet (750 mg total) by mouth daily. 5 tablet 0  . metFORMIN (GLUCOPHAGE) 500 MG tablet Take 1 tablet (500 mg total) by mouth daily with breakfast. 90 tablet  1  . methocarbamol (ROBAXIN) 500 MG tablet Take 1 tablet (500 mg total) by mouth every 8 (eight) hours as needed for muscle spasms. (Patient not taking: Reported on 11/11/2018) 60 tablet 0  . pantoprazole (PROTONIX) 40 MG tablet Take 40 mg by mouth every morning.    Marland Kitchen QUEtiapine (SEROQUEL) 50 MG tablet Take 50 mg by mouth daily with breakfast.    . valACYclovir (VALTREX) 500 MG tablet Take 1 tablet (500 mg total) by mouth 2 (two) times daily. 60 tablet 2   No current facility-administered medications on file prior to visit.     Allergies  Allergen  Reactions  . Lisinopril     Social History   Socioeconomic History  . Marital status: Single    Spouse name: Not on file  . Number of children: Not on file  . Years of education: Not on file  . Highest education level: Not on file  Occupational History  . Not on file  Social Needs  . Financial resource strain: Not on file  . Food insecurity:    Worry: Not on file    Inability: Not on file  . Transportation needs:    Medical: Not on file    Non-medical: Not on file  Tobacco Use  . Smoking status: Current Every Day Smoker    Packs/day: 0.50    Types: Cigarettes  . Smokeless tobacco: Never Used  Substance and Sexual Activity  . Alcohol use: Yes    Comment: seldom  . Drug use: No    Types: Cocaine    Comment: clean x 60 days on 11/28/15  . Sexual activity: Yes  Lifestyle  . Physical activity:    Days per week: Not on file    Minutes per session: Not on file  . Stress: Not on file  Relationships  . Social connections:    Talks on phone: Not on file    Gets together: Not on file    Attends religious service: Not on file    Active member of club or organization: Not on file    Attends meetings of clubs or organizations: Not on file    Relationship status: Not on file  . Intimate partner violence:    Fear of current or ex partner: Not on file    Emotionally abused: Not on file    Physically abused: Not on file    Forced sexual activity: Not on file  Other Topics Concern  . 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  Past Surgical History:  Procedure Laterality Date  . ABDOMINAL HYSTERECTOMY  2018  . BREAST LUMPECTOMY Right 2013  . GASTRIC BYPASS  2001    ROS: Review of Systems Negative except as stated above  PHYSICAL EXAM: BP 120/72   Pulse 83   Temp 98.4 F (36.9 C) (Oral)   Resp 16   Wt 241 lb 6.4 oz (109.5 kg)   SpO2 98%   BMI 39.86 kg/m    Wt Readings from Last 3 Encounters:  11/11/18 241 lb 6.4 oz (109.5 kg)  07/02/18 237 lb (107.5 kg)  04/24/18 237 lb 9.6 oz (107.8 kg)    Physical Exam  General appearance - alert, well appearing, and in no distress Mental status - normal mood, behavior, speech, dress, motor activity, and thought processes Neck - supple, no significant adenopathy Chest -  clear to auscultation, no wheezes, rales or rhonchi, symmetric air entry Heart - normal rate, regular rhythm, normal S1, S2, no murmurs, rubs, clicks or gallops Extremities - trace BL LE and pedal edema  CMP Latest Ref Rng & Units 01/22/2018 07/11/2017 08/08/2016  Glucose 65 - 99 mg/dL 85 96 95  BUN 8 - 27 mg/dL 18 17 14   Creatinine 0.57 - 1.00 mg/dL 0.95 0.91 0.95  Sodium 134 - 144 mmol/L 144 140 143  Potassium 3.5 - 5.2 mmol/L 4.6 4.0 4.3  Chloride 96 - 106 mmol/L 103 106 107  CO2 20 - 29 mmol/L 23 22 29   Calcium 8.7 - 10.3 mg/dL 9.3 9.3 9.2  Total Protein 6.0 - 8.5 g/dL 7.3 7.7 7.0  Total Bilirubin 0.0 - 1.2 mg/dL 0.3 0.4 0.4  Alkaline Phos 39 - 117 IU/L 134(H) 104 101  AST 0 - 40 IU/L 25 14 16   ALT 0 - 32 IU/L 53(H) 14 21   Lipid Panel     Component Value Date/Time   CHOL 163 07/11/2017 1107   TRIG 69 07/11/2017 1107   HDL 52 07/11/2017 1107   CHOLHDL 3.1 07/11/2017 1107   CHOLHDL 3.0 08/08/2016 1102   VLDL 17 08/08/2016 1102   LDLCALC 97 07/11/2017 1107    CBC    Component Value Date/Time   WBC 7.7 01/22/2018 1443   RBC 5.24 01/22/2018 1443   HGB 14.8 01/22/2018 1443   HCT 45.7 01/22/2018 1443   PLT 231 01/22/2018 1443   MCV 87 01/22/2018 1443   MCH 28.2 01/22/2018 1443   MCHC 32.4 01/22/2018 1443   RDW 14.5 01/22/2018 1443   LYMPHSABS 2.7 01/22/2018 1443   EOSABS 0.2 01/22/2018 1443   BASOSABS 0.0 01/22/2018 1443   EKG: Sinus bradycardia with rate of 61.  Good R wave progression.  No ischemic changes.  No old EKG to compare. ASSESSMENT AND PLAN:  1. Bilateral leg edema Physical exam does not suggest  CHF.  We will check kidney function and albumin with chemistry. -Decrease amlodipine from 10 mg to 5 mg daily.  Increase HCTZ to 25 mg instead - Brain natriuretic peptide - Comprehensive metabolic panel  2. Obesity (BMI 35.0-39.9 without comorbidity) Commended her on dietary changes.  Counseling given and printed information also given.  She is agreeable to seeing a nutritionist. - Amb ref to Medical Nutrition Therapy-MNT - Hemoglobin A1c  3. Tobacco dependence Advised to quit.  Patient not yet ready to give a trial of quitting.  She is aware of the health risks associated with smoking.  When she is ready to quit she will use the nicotine gum more consistently.  Less than 5-minute spent on counseling  4. Left arm pain Likely musculoskeletal.  However she does have risk factors for heart disease.  I recommend taking a baby aspirin 81 mg daily - EKG 12-Lead  5. Pre-diabetes - Hemoglobin A1c    Patient was given the opportunity to ask questions.  Patient verbalized understanding of the plan and was able to repeat key elements of the plan.   Orders Placed This Encounter  Procedures  . Brain natriuretic peptide  . Comprehensive metabolic panel  . Hemoglobin A1c  . Amb ref to Medical Nutrition Therapy-MNT  . EKG 12-Lead     Requested Prescriptions   Signed Prescriptions Disp Refills  . amLODipine (NORVASC) 5 MG tablet 90 tablet 1    Sig: Take 1 tablet (5 mg total) by mouth daily. Dose dec 11/11/2018  . hydrochlorothiazide (  HYDRODIURIL) 25 MG tablet 90 tablet 1    Sig: Take 1 tablet (25 mg total) by mouth daily.    No follow-ups on file.  Karle Plumber, MD, FACP

## 2018-11-12 ENCOUNTER — Ambulatory Visit: Payer: Medicare HMO | Admitting: Pulmonary Disease

## 2018-11-12 LAB — COMPREHENSIVE METABOLIC PANEL
ALT: 16 IU/L (ref 0–32)
AST: 16 IU/L (ref 0–40)
Albumin/Globulin Ratio: 1.4 (ref 1.2–2.2)
Albumin: 4.2 g/dL (ref 3.8–4.8)
Alkaline Phosphatase: 102 IU/L (ref 39–117)
BUN/Creatinine Ratio: 15 (ref 12–28)
BUN: 14 mg/dL (ref 8–27)
Bilirubin Total: 0.3 mg/dL (ref 0.0–1.2)
CO2: 24 mmol/L (ref 20–29)
Calcium: 9.2 mg/dL (ref 8.7–10.3)
Chloride: 102 mmol/L (ref 96–106)
Creatinine, Ser: 0.95 mg/dL (ref 0.57–1.00)
GFR calc Af Amer: 73 mL/min/{1.73_m2} (ref >59)
GFR calc non Af Amer: 63 mL/min/{1.73_m2} (ref >59)
Globulin, Total: 2.9 g/dL (ref 1.5–4.5)
Glucose: 95 mg/dL (ref 65–99)
Potassium: 4.3 mmol/L (ref 3.5–5.2)
Sodium: 140 mmol/L (ref 134–144)
Total Protein: 7.1 g/dL (ref 6.0–8.5)

## 2018-11-12 LAB — HEMOGLOBIN A1C
Est. average glucose Bld gHb Est-mCnc: 117 mg/dL
Hgb A1c MFr Bld: 5.7 % — ABNORMAL HIGH (ref 4.8–5.6)

## 2018-11-12 LAB — BRAIN NATRIURETIC PEPTIDE: BNP: 16.3 pg/mL (ref 0.0–100.0)

## 2018-11-19 ENCOUNTER — Ambulatory Visit: Payer: Medicare HMO | Admitting: Podiatry

## 2018-12-03 DIAGNOSIS — G4733 Obstructive sleep apnea (adult) (pediatric): Secondary | ICD-10-CM | POA: Diagnosis not present

## 2018-12-03 NOTE — Telephone Encounter (Signed)
Scheduled surgery for 12/22/18.

## 2018-12-05 DIAGNOSIS — R945 Abnormal results of liver function studies: Secondary | ICD-10-CM | POA: Diagnosis not present

## 2018-12-11 ENCOUNTER — Ambulatory Visit: Payer: Medicare HMO | Admitting: Registered"

## 2018-12-11 ENCOUNTER — Other Ambulatory Visit: Payer: Self-pay

## 2018-12-15 ENCOUNTER — Encounter: Payer: Self-pay | Admitting: Internal Medicine

## 2018-12-15 ENCOUNTER — Telehealth (HOSPITAL_BASED_OUTPATIENT_CLINIC_OR_DEPARTMENT_OTHER): Payer: Medicare HMO | Admitting: Internal Medicine

## 2018-12-15 DIAGNOSIS — R7303 Prediabetes: Secondary | ICD-10-CM

## 2018-12-15 DIAGNOSIS — R911 Solitary pulmonary nodule: Secondary | ICD-10-CM

## 2018-12-15 DIAGNOSIS — I10 Essential (primary) hypertension: Secondary | ICD-10-CM | POA: Diagnosis not present

## 2018-12-15 DIAGNOSIS — M1712 Unilateral primary osteoarthritis, left knee: Secondary | ICD-10-CM

## 2018-12-15 MED ORDER — BLOOD PRESSURE MONITOR DEVI: 0 refills | Status: DC

## 2018-12-15 NOTE — Progress Notes (Signed)
Patient verified DOB Patient has taken medication today. Patient has eaten today. Patient complains of knee pain that is scheduled for next week. Patient has stopped metformin since last week due to research on cancer causing concern.

## 2018-12-15 NOTE — Progress Notes (Signed)
[place], [place] [address] Phone: [phone] Fax: [place], [place] - [address] [place] [address] Phone: [phone] Fax: [phone]      Your procedure is scheduled on June 29  Report to [facility] Main Entrance "A" at 0800 A.M., and check in at the Admitting office.  Call this number if you have problems the morning of surgery:  [phone]  Call [phone] if you have any questions prior to your surgery date Monday-Friday 8am-4pm    Remember:  Do not eat after midnight.  You may drink clear liquids until 0700 am the morning of surgery  Clear liquids allowed are: Water, Non-Citrus Juices (without pulp), Carbonated Beverages, Clear Tea, Black Coffee Only, and Gatorade   Please complete your G2 that was provided to you by 700 am the morning of surgery.  Please, if able, drink it in one setting. DO NOT SIP.   Take these medicines the morning of surgery with A SIP OF WATER  amLODipine (NORVASC) carvedilol (COREG) lamoTRIgine (LAMICTAL) loratadine (CLARITIN)  pantoprazole (PROTONIX) QUEtiapine (SEROQUEL)  7 days prior to surgery STOP taking any Aspirin (unless otherwise instructed by your surgeon), Aleve, Naproxen, Ibuprofen, Motrin, Advil, Goody's, BC's, all herbal medications, fish oil, and all vitamins.   WHAT DO I DO ABOUT MY DIABETES MEDICATION?   Marland Kitchen Do not take oral diabetes medicines (pills) the morning of surgery. metFORMIN (GLUCOPHAGE) if you are still taking    How to Manage Your Diabetes Before and After Surgery  Why is it important to control my blood sugar before and after surgery? . Improving blood sugar levels before and after surgery helps healing and can limit problems. . A way of improving blood sugar control is eating a healthy diet by: o  Eating less sugar and carbohydrates o   Increasing activity/exercise o  Talking with your doctor about reaching your blood sugar goals . High blood sugars (greater than 180 mg/dL) can raise your risk of infections and slow your recovery, so you will need to focus on controlling your diabetes during the weeks before surgery. . Make sure that the doctor who takes care of your diabetes knows about your planned surgery including the date and location.  How do I manage my blood sugar before surgery? . Check your blood sugar at least 4 times a day, starting 2 days before surgery, to make sure that the level is not too high or low. o Check your blood sugar the morning of your surgery when you wake up and every 2 hours until you get to the Short Stay unit. . If your blood sugar is less than 70 mg/dL, you will need to treat for low blood sugar: o Do not take insulin. o Treat a low blood sugar (less than 70 mg/dL) with  cup of clear juice (cranberry or apple), 4 glucose tablets, OR glucose gel. o Recheck blood sugar in 15 minutes after treatment (to make sure it is greater than 70 mg/dL). If your blood sugar is not greater than 70 mg/dL on recheck, call [phone] for further instructions. . Report your blood sugar to the short stay nurse when you get to Short Stay.  . If you are admitted to the hospital after surgery: o Your blood sugar will be checked by the staff and you will probably be given insulin after surgery (instead of oral diabetes  medicines) to make sure you have good blood sugar levels. o The goal for blood sugar control after surgery is 80-180 mg/dL.  The Morning of Surgery  Do not wear jewelry, make-up or nail polish.  Do not wear lotions, powders, or perfumes/colognes, or deodorant  Do not shave 48 hours prior to surgery.  Men may shave face and neck.  Do not bring valuables to the hospital.  [facility] is not responsible for any belongings or valuables.  If you are a smoker, DO NOT Smoke 24 hours prior to surgery IF  you wear a CPAP at night please bring your mask, tubing, and machine the morning of surgery   Remember that you must have someone to transport you home after your surgery, and remain with you for 24 hours if you are discharged the same day.   Contacts, glasses, hearing aids, dentures or bridgework may not be worn into surgery.    Leave your suitcase in the car.  After surgery it may be brought to your room.  For patients admitted to the hospital, discharge time will be determined by your treatment team.  Patients discharged the day of surgery will not be allowed to drive home.    Special instructions:   [facility]- Preparing For Surgery  Before surgery, you can play an important role. Because skin is not sterile, your skin needs to be as free of germs as possible. You can reduce the number of germs on your skin by washing with CHG (chlorahexidine gluconate) Soap before surgery.  CHG is an antiseptic cleaner which kills germs and bonds with the skin to continue killing germs even after washing.    Oral Hygiene is also important to reduce your risk of infection.  Remember - BRUSH YOUR TEETH THE MORNING OF SURGERY WITH YOUR REGULAR TOOTHPASTE  Please do not use if you have an allergy to CHG or antibacterial soaps. If your skin becomes reddened/irritated stop using the CHG.  Do not shave (including legs and underarms) for at least 48 hours prior to first CHG shower. It is OK to shave your face.  Please follow these instructions carefully.   1. Shower the NIGHT BEFORE SURGERY and the MORNING OF SURGERY with CHG Soap.   2. If you chose to wash your hair, wash your hair first as usual with your normal shampoo.  3. After you shampoo, rinse your hair and body thoroughly to remove the shampoo.  4. Use CHG as you would any other liquid soap. You can apply CHG directly to the skin and wash gently with a scrungie or a clean washcloth.   5. Apply the CHG Soap to your body ONLY FROM THE NECK  DOWN.  Do not use on open wounds or open sores. Avoid contact with your eyes, ears, mouth and genitals (private parts). Wash Face and genitals (private parts)  with your normal soap.   6. Wash thoroughly, paying special attention to the area where your surgery will be performed.  7. Thoroughly rinse your body with warm water from the neck down.  8. DO NOT shower/wash with your normal soap after using and rinsing off the CHG Soap.  9. Pat yourself dry with a CLEAN TOWEL.  10. Wear CLEAN PAJAMAS to bed the night before surgery, wear comfortable clothes the morning of surgery  11. Place CLEAN SHEETS on your bed the night of your first shower and DO NOT SLEEP WITH PETS.    Day of Surgery:  Do not apply any  deodorants/lotions.  Please wear clean clothes to the hospital/surgery center.   Remember to brush your teeth WITH YOUR REGULAR TOOTHPASTE.   Please read over the following fact sheets that you were given.

## 2018-12-15 NOTE — Progress Notes (Signed)
Virtual Visit via Telephone Note Due to current restrictions/limitations of in-office visits due to the COVID-19 pandemic, this scheduled clinical appointment was converted to a telehealth visit  I connected with Rachel Woodard on 12/15/18 at 3:28 p.m EDT by telephone and verified that I am speaking with the correct person using two identifiers. I am in my office.  The patient is at home.  Only the patient and myself participated in this encounter.  I discussed the limitations, risks, security and privacy concerns of performing an evaluation and management service by telephone and the availability of in person appointments. I also discussed with the patient that there may be a patient responsible charge related to this service. The patient expressed understanding and agreed to proceed.   History of Present Illness: Hxpre-DM,mdd/anxiety, sub abuse (crack cocaine) in remission, HTN, tob dep, gerd, OSA on CPAP fibromyalgia, urge incont, Vit D def, RT renal cyst, RT lung nodule, autoimmune hepatitis.Breast CA RT 2013 ([place] with lumpectomyandXRT).   HTN/LE edema:  Doing better since Norvasc dec and HCTZ increased.  No further LE edema -no device to check BP but wants one  Pre-DM:  She stopped metformin because it causes embarrassing diarrhea and concern about the recall on Metformin ER -Feels she is doing a lot better with her eating habits  Seen and evaluated by [facility] for the question of autoimmune hepatitis.  She was assessed not to have any autoimmune liver disease.   I asked whether she was also evaluated by [facility] for the right lung nodule.  She was not.  She thinks that her gastroenterologist here Dr. Dorise Hiss biopsy the lung nodule when he did her EGD.  She also tells me that she tried calling the pulmonologist Dr. Judson Roch office to schedule an appointment with a states that he is not seeing patients right now she would need to call back next month.  Will have  knee replacement surgery on LT next Monday.    Outpatient Encounter Medications as of 12/15/2018  Medication Sig  . amLODipine (NORVASC) 5 MG tablet Take 1 tablet (5 mg total) by mouth daily. Dose dec 11/11/2018  . carvedilol (COREG) 3.125 MG tablet Take 1 tablet (3.125 mg total) by mouth 2 (two) times daily with a meal.  . hydrochlorothiazide (HYDRODIURIL) 25 MG tablet Take 1 tablet (25 mg total) by mouth daily.  Marland Kitchen lamoTRIgine (LAMICTAL) 100 MG tablet Take 100 mg by mouth daily.  Marland Kitchen loratadine (CLARITIN) 10 MG tablet Take 10 mg by mouth daily.  . pantoprazole (PROTONIX) 40 MG tablet Take 40 mg by mouth every morning.  Marland Kitchen QUEtiapine (SEROQUEL) 50 MG tablet Take 25 mg by mouth at bedtime as needed (sleep).   . valACYclovir (VALTREX) 500 MG tablet Take 1 tablet (500 mg total) by mouth 2 (two) times daily. (Patient taking differently: Take 500 mg by mouth See admin instructions. Take 1 tablet twice a day during outbreaks.)  . vitamin C (ASCORBIC ACID) 500 MG tablet Take 500 mg by mouth daily.  . metFORMIN (GLUCOPHAGE) 500 MG tablet Take 1 tablet (500 mg total) by mouth daily with breakfast. (Patient not taking: Reported on 12/11/2018)   No facility-administered encounter medications on file as of 12/15/2018.     Observations/Objective: No direct observation done as this was a telephone encounter  Assessment and Plan: 1. Essential hypertension I have sent prescription to her pharmacy for blood pressure monitoring device.  Advised to check blood pressure at least twice a week with goal being 130/80 or  lower. - Blood Pressure Monitor DEVI; Use as directed to check home blood pressure 2-3 times a week  Dispense: 1 Device; Refill: 0  2. Pre-diabetes Stop metformin due to diarrhea.  Encourage her to continue healthy eating habits  3. Lung nodule, solitary We will refer her to pulmonary for evaluation of this lung nodule.  I have told her that it is outside the realm of the gastroenterologist to  evaluate this - Ambulatory referral to Pulmonology  4. Osteoarthritis of left knee, unspecified osteoarthritis type Followed by Dr. Erlinda Hong with plans for knee replacement surgery   Follow Up Instructions:    I discussed the assessment and treatment plan with the patient. The patient was provided an opportunity to ask questions and all were answered. The patient agreed with the plan and demonstrated an understanding of the instructions.   The patient was advised to call back or seek an in-person evaluation if the symptoms worsen or if the condition fails to improve as anticipated.  I provided 12 minutes of non-face-to-face time during this encounter.   Karle Plumber, MD

## 2018-12-16 ENCOUNTER — Encounter (HOSPITAL_COMMUNITY)
Admission: RE | Admit: 2018-12-16 | Discharge: 2018-12-16 | Disposition: A | Discharge status: Home / Self Care | Payer: Medicare HMO | Source: Ambulatory Visit | Attending: Orthopaedic Surgery | Admitting: Orthopaedic Surgery

## 2018-12-16 ENCOUNTER — Encounter (HOSPITAL_COMMUNITY): Payer: Self-pay

## 2018-12-16 ENCOUNTER — Other Ambulatory Visit: Payer: Self-pay

## 2018-12-16 ENCOUNTER — Ambulatory Visit (HOSPITAL_COMMUNITY)
Admission: RE | Admit: 2018-12-16 | Discharge: 2018-12-16 | Disposition: A | Discharge status: Home / Self Care | Payer: Medicare HMO | Source: Ambulatory Visit | Attending: Physician Assistant | Admitting: Physician Assistant

## 2018-12-16 DIAGNOSIS — K219 Gastro-esophageal reflux disease without esophagitis: Secondary | ICD-10-CM | POA: Insufficient documentation

## 2018-12-16 DIAGNOSIS — Z01818 Encounter for other preprocedural examination: Secondary | ICD-10-CM | POA: Diagnosis not present

## 2018-12-16 DIAGNOSIS — J449 Chronic obstructive pulmonary disease, unspecified: Secondary | ICD-10-CM | POA: Insufficient documentation

## 2018-12-16 DIAGNOSIS — M1712 Unilateral primary osteoarthritis, left knee: Secondary | ICD-10-CM | POA: Insufficient documentation

## 2018-12-16 DIAGNOSIS — I10 Essential (primary) hypertension: Secondary | ICD-10-CM | POA: Insufficient documentation

## 2018-12-16 DIAGNOSIS — Z79899 Other long term (current) drug therapy: Secondary | ICD-10-CM | POA: Diagnosis not present

## 2018-12-16 DIAGNOSIS — Z853 Personal history of malignant neoplasm of breast: Secondary | ICD-10-CM | POA: Insufficient documentation

## 2018-12-16 DIAGNOSIS — R7303 Prediabetes: Secondary | ICD-10-CM | POA: Insufficient documentation

## 2018-12-16 DIAGNOSIS — F17210 Nicotine dependence, cigarettes, uncomplicated: Secondary | ICD-10-CM | POA: Insufficient documentation

## 2018-12-16 DIAGNOSIS — Z923 Personal history of irradiation: Secondary | ICD-10-CM | POA: Diagnosis not present

## 2018-12-16 DIAGNOSIS — G4730 Sleep apnea, unspecified: Secondary | ICD-10-CM | POA: Insufficient documentation

## 2018-12-16 HISTORY — DX: Gastro-esophageal reflux disease without esophagitis: K21.9

## 2018-12-16 HISTORY — DX: Sleep apnea, unspecified: G47.30

## 2018-12-16 HISTORY — DX: Pneumonia, unspecified organism: J18.9

## 2018-12-16 HISTORY — DX: Chronic obstructive pulmonary disease, unspecified: J44.9

## 2018-12-16 HISTORY — DX: Prediabetes: R73.03

## 2018-12-16 LAB — TYPE AND SCREEN
ABO/RH(D): O POS
Antibody Screen: NEGATIVE

## 2018-12-16 LAB — COMPREHENSIVE METABOLIC PANEL
ALT: 20 U/L (ref 0–44)
AST: 20 U/L (ref 15–41)
Albumin: 4.2 g/dL (ref 3.5–5.0)
Alkaline Phosphatase: 101 U/L (ref 38–126)
Anion gap: 9 (ref 5–15)
BUN: 13 mg/dL (ref 8–23)
CO2: 27 mmol/L (ref 22–32)
Calcium: 9.2 mg/dL (ref 8.9–10.3)
Chloride: 104 mmol/L (ref 98–111)
Creatinine, Ser: 0.89 mg/dL (ref 0.44–1.00)
GFR calc Af Amer: >60 mL/min (ref >60)
GFR calc non Af Amer: >60 mL/min (ref >60)
Glucose, Bld: 90 mg/dL (ref 70–99)
Potassium: 3.9 mmol/L (ref 3.5–5.1)
Sodium: 140 mmol/L (ref 135–145)
Total Bilirubin: 0.9 mg/dL (ref 0.3–1.2)
Total Protein: 8.1 g/dL (ref 6.5–8.1)

## 2018-12-16 LAB — CBC WITH DIFFERENTIAL/PLATELET
Abs Immature Granulocytes: 0.03 10*3/uL (ref 0.00–0.07)
Basophils Absolute: 0.1 10*3/uL (ref 0.0–0.1)
Basophils Relative: 1 %
Eosinophils Absolute: 0.1 10*3/uL (ref 0.0–0.5)
Eosinophils Relative: 2 %
HCT: 48 % — ABNORMAL HIGH (ref 36.0–46.0)
Hemoglobin: 15.1 g/dL — ABNORMAL HIGH (ref 12.0–15.0)
Immature Granulocytes: 0 %
Lymphocytes Relative: 34 %
Lymphs Abs: 2.4 10*3/uL (ref 0.7–4.0)
MCH: 27.6 pg (ref 26.0–34.0)
MCHC: 31.5 g/dL (ref 30.0–36.0)
MCV: 87.6 fL (ref 80.0–100.0)
Monocytes Absolute: 0.5 10*3/uL (ref 0.1–1.0)
Monocytes Relative: 7 %
Neutro Abs: 4 10*3/uL (ref 1.7–7.7)
Neutrophils Relative %: 56 %
Platelets: 214 10*3/uL (ref 150–400)
RBC: 5.48 MIL/uL — ABNORMAL HIGH (ref 3.87–5.11)
RDW: 14.2 % (ref 11.5–15.5)
WBC: 7.2 10*3/uL (ref 4.0–10.5)
nRBC: 0 % (ref 0.0–0.2)

## 2018-12-16 LAB — ABO/RH: ABO/RH(D): O POS

## 2018-12-16 LAB — SURGICAL PCR SCREEN
MRSA, PCR: NEGATIVE
Staphylococcus aureus: NEGATIVE

## 2018-12-16 LAB — GLUCOSE, CAPILLARY: Glucose-Capillary: 82 mg/dL (ref 70–99)

## 2018-12-16 LAB — PROTIME-INR
INR: 1.1 (ref 0.8–1.2)
Prothrombin Time: 13.7 seconds (ref 11.4–15.2)

## 2018-12-16 LAB — APTT: aPTT: 32 seconds (ref 24–36)

## 2018-12-16 NOTE — Progress Notes (Addendum)
PCP - Dr.Deborah Wynetta Emery  MD Cardiologist - none  Chest x-ray -12-16-2018  EKG - 11-11-2018 Stress Test -  ECHO -  Cardiac Cath -   Sleep Study 07-31-2018-  CPAP -yes   Fasting Blood Sugar does not check blood sugar at home Checks Blood Sugar __0___ times a day  Blood Thinner Instructions: Aspirin Instructions:on hold  Anesthesia review:   Patient denies shortness of breath, fever, cough and chest pain at PAT appointment   Patient verbalized understanding of instructions that were given to them at the PAT appointment. Patient was also instructed that they will need to review over the PAT instructions again at home before surgery.

## 2018-12-17 NOTE — Progress Notes (Signed)
Anesthesia Chart Review:  Case: [id number] Date/Time: 12/22/18 0951   Procedure: LEFT TOTAL KNEE ARTHROPLASTY (Left Knee)   Anesthesia type: Spinal   Pre-op diagnosis: left knee degenerative joint disease   Location: [facility] OR ROOM 04 / [place] [facility]   Surgeon: Leandrew Koyanagi, MD      DISCUSSION: Patient is a 68 year old female scheduled for the above procedure.  History includes smoking, HTN, PTSD, anxiety, fibromyalgia, COPD, OSA (CPAP), GERD, pre-diabetes, breast cancer (s/p right breast lumpectomy, radiation 2013 in [place]; s/p left breast lumpeotmy 12/22/13 with benign pathology), gastric bypass (2001), anemia (post-gastric bypass), substance abuse (cocaine, in remission since 2017). BMI is consistent with obesity.   She was last evaluated by her PCP Ladell Pier, MD on 12/15/18 via virtual visit. BLE swelling improved since May after amlodipine decreased and HTCZ increased. She also reported left arm pain at the May visit that was felt to likely be musculoskeletal. BNP was 16 then, and EKG showed unusual P axis, possible ectopic atrial rhythm. Recent Hepatology evaluation was not consistent with autoimmune hepatitis. Patient apparently also needs a possible RLL lung nodule further evaluated by pulmonology (incidental finding on 09/30/18 abd/pelvis CT ordered by Phebe Colla, MD), but she reported that she would have to wait until next month to schedule an appointment with Dr. Ander Slade. Dr. Wynetta Emery is aware of orthopedic surgery plans. Patient denied SOB, chest pain, cough, fever at PAT RN visit.   Reviewed above and recent EKG with anesthesiologist Myrtie Soman, MD. Presurgical COVID test is scheduled for 12/18/18. If results negative and otherwise remains asymptomatic from a CV standpoint and without other acute changes then it is anticipated that she can proceed as planned.   VS: BP (!) 111/55   Pulse 63   Temp 36.6 C   Resp 20   Ht 5\' 6"  (1.676 m)   Wt 106.8 kg   SpO2 96%   BMI 38.01 kg/m      PROVIDERS: Ladell Pier, MD is PCP. She is aware of surgery plans. Recent evaluation 12/15/18.  - Otis Brace, MD is GI. She was referred to [facility] for slightly abnormal AMA antibody, ASMA, and IgG results in the setting of elevated LFTs. Repeat LFTs WNL. Liver unremarkable on 07/2018 [place]. She did not meet criteria for autoimmune hepatitis or PBC. Hettie Holstein, MD/[place], Mitzi Hansen, MD recommended at least annual LFT monitoring, but otherwise no additional work-up recommended.  Sherrilyn Rist, MD is pulmonologist. Last visit 07/02/18 for COPD exacerbation. Dyspnea felt likely related to COPD. Patient attempting to quit smoking. History of moderate OSA. Follow-up in about three months recommended. Phebe Colla, MD is urologist.    LABS: Labs reviewed: Acceptable for surgery. (all labs ordered are listed, but only abnormal results are displayed)  Labs Reviewed  CBC WITH DIFFERENTIAL/PLATELET - Abnormal; Notable for the following components:      Result Value   RBC 5.48 (*)    Hemoglobin 15.1 (*)    HCT 48.0 (*)    All other components within normal limits  SURGICAL PCR SCREEN  GLUCOSE, CAPILLARY  APTT  COMPREHENSIVE METABOLIC PANEL  PROTIME-INR  TYPE AND SCREEN  ABO/RH    IMAGES: CXR 12/17/18: FINDINGS: The previously demonstrated right lower lobe pneumonia has significantly improved. There is no pneumothorax. No large pleural effusion. The heart size is stable. Aortic calcifications are noted. There is no acute osseous abnormality. There are few hyperdense bodies in the left upper quadrant which likely represent dense enteric  material within the colon. IMPRESSION: No active cardiopulmonary disease.  Abdominal US 08/05/18: IMPRESSION: 1. Multiple mobile gallstones measuring up to 9 mm. No sonographic evidence of gallbladder inflammation. 2. 9.7 cm right renal cyst.   EKG: 12/17/18: Unusual P axis, possible ectopic atrial rhythm Abnormal ECG No old  tracing to compare Confirmed by Martin[place], Peter [phone]) on 11/11/2018 2:42:22 PM   CV: N/A  Past Medical History:  Diagnosis Date  . Anemia 2001   after gastric bypass  . Anxiety   . Arthritis   . Breast cancer ([place]) 2013   Breast cancer    Radiation therapy  . COPD (chronic obstructive pulmonary disease) ([place])   . Depression   . Fibromyalgia   . GERD (gastroesophageal reflux disease)   . Hypertension   . Personal history of radiation therapy 2013  . Pneumonia   . Pre-diabetes   . PTSD (post-traumatic stress disorder)   . Sleep apnea    uses CPAP    Past Surgical History:  Procedure Laterality Date  . ABDOMINAL HYSTERECTOMY  2018  . BREAST LUMPECTOMY Right 2013  . GASTRIC BYPASS  2001    MEDICATIONS: . amLODipine (NORVASC) 5 MG tablet  . Blood Pressure Monitor DEVI  . carvedilol (COREG) 3.125 MG tablet  . hydrochlorothiazide (HYDRODIURIL) 25 MG tablet  . lamoTRIgine (LAMICTAL) 100 MG tablet  . loratadine (CLARITIN) 10 MG tablet  . pantoprazole (PROTONIX) 40 MG tablet  . QUEtiapine (SEROQUEL) 50 MG tablet  . valACYclovir (VALTREX) 500 MG tablet  . vitamin C (ASCORBIC ACID) 500 MG tablet   No current facility-administered medications for this encounter.     Myra Gianotti, PA-C Surgical Short Stay/Anesthesiology [facility] Phone [phone] [facility] [place] Phone [phone] 12/18/2018 11:15 AM

## 2018-12-18 ENCOUNTER — Other Ambulatory Visit: Payer: Self-pay | Admitting: Internal Medicine

## 2018-12-18 ENCOUNTER — Other Ambulatory Visit (HOSPITAL_COMMUNITY)
Admission: RE | Admit: 2018-12-18 | Discharge: 2018-12-18 | Disposition: A | Discharge status: Home / Self Care | Payer: Medicare HMO | Source: Ambulatory Visit | Attending: Orthopaedic Surgery | Admitting: Orthopaedic Surgery

## 2018-12-18 DIAGNOSIS — Z1159 Encounter for screening for other viral diseases: Secondary | ICD-10-CM | POA: Diagnosis not present

## 2018-12-18 DIAGNOSIS — I10 Essential (primary) hypertension: Secondary | ICD-10-CM

## 2018-12-18 LAB — SARS CORONAVIRUS 2 (TAT 6-24 HRS): SARS Coronavirus 2: NEGATIVE

## 2018-12-18 NOTE — Anesthesia Preprocedure Evaluation (Addendum)
Anesthesia Evaluation  Patient identified by MRN, date of birth, ID band Patient awake    Reviewed: Allergy & Precautions, NPO status , Patient's Chart, lab work & pertinent test results  Airway Mallampati: II  TM Distance: >3 FB Neck ROM: Full    Dental  (+) Teeth Intact, Dental Advisory Given   Pulmonary sleep apnea , COPD, Current Smoker,    Pulmonary exam normal        Cardiovascular hypertension, Pt. on medications and Pt. on home beta blockers  Rhythm:Regular Rate:Normal     Neuro/Psych Anxiety Depression  Neuromuscular disease    GI/Hepatic GERD  Medicated,(+) Hepatitis -  Endo/Other  negative endocrine ROS  Renal/GU      Musculoskeletal  (+) Arthritis , Fibromyalgia -  Abdominal (+) + obese,   Peds  Hematology   Anesthesia Other Findings   Reproductive/Obstetrics                           Anesthesia Physical Anesthesia Plan  ASA: II  Anesthesia Plan: Spinal   Post-op Pain Management:  Regional for Post-op pain   Induction: Intravenous  PONV Risk Score and Plan: 2 and Ondansetron, Dexamethasone and Propofol infusion  Airway Management Planned: Natural Airway and Simple Face Mask  Additional Equipment: None  Intra-op Plan:   Post-operative Plan:   Informed Consent: I have reviewed the patients History and Physical, chart, labs and discussed the procedure including the risks, benefits and alternatives for the proposed anesthesia with the patient or authorized representative who has indicated his/her understanding and acceptance.     Dental advisory given  Plan Discussed with: CRNA  Anesthesia Plan Comments: (PAT note written 12/18/2018 by Myra Gianotti, PA-C. )      Anesthesia Quick Evaluation

## 2018-12-19 MED ORDER — TRANEXAMIC ACID 1000 MG/10ML IV SOLN
2000.0000 mg | INTRAVENOUS | Status: DC
  Filled 2018-12-19: qty 20

## 2018-12-22 ENCOUNTER — Encounter (HOSPITAL_COMMUNITY)
Admission: AD | Disposition: A | Discharge status: Skilled Nursing Facility | Payer: Self-pay | Source: Home / Self Care | Attending: Orthopaedic Surgery

## 2018-12-22 ENCOUNTER — Ambulatory Visit (HOSPITAL_COMMUNITY): Payer: Medicare HMO | Admitting: Anesthesiology

## 2018-12-22 ENCOUNTER — Other Ambulatory Visit: Payer: Self-pay

## 2018-12-22 ENCOUNTER — Inpatient Hospital Stay (HOSPITAL_COMMUNITY)
Admission: AD | Admit: 2018-12-22 | Discharge: 2018-12-26 | DRG: 470 | Disposition: A | Discharge status: Skilled Nursing Facility | Payer: Medicare HMO | Attending: Orthopedic Surgery | Admitting: Orthopedic Surgery

## 2018-12-22 ENCOUNTER — Ambulatory Visit (HOSPITAL_COMMUNITY): Payer: Medicare HMO | Admitting: Vascular Surgery

## 2018-12-22 ENCOUNTER — Encounter (HOSPITAL_COMMUNITY): Payer: Self-pay | Admitting: Anesthesiology

## 2018-12-22 ENCOUNTER — Observation Stay (HOSPITAL_COMMUNITY): Payer: Medicare HMO

## 2018-12-22 DIAGNOSIS — R5381 Other malaise: Secondary | ICD-10-CM | POA: Diagnosis not present

## 2018-12-22 DIAGNOSIS — Z471 Aftercare following joint replacement surgery: Secondary | ICD-10-CM | POA: Diagnosis not present

## 2018-12-22 DIAGNOSIS — G4730 Sleep apnea, unspecified: Secondary | ICD-10-CM | POA: Diagnosis present

## 2018-12-22 DIAGNOSIS — Z6838 Body mass index (BMI) 38.0-38.9, adult: Secondary | ICD-10-CM

## 2018-12-22 DIAGNOSIS — Z90710 Acquired absence of both cervix and uterus: Secondary | ICD-10-CM

## 2018-12-22 DIAGNOSIS — Z4789 Encounter for other orthopedic aftercare: Secondary | ICD-10-CM | POA: Diagnosis not present

## 2018-12-22 DIAGNOSIS — Z96652 Presence of left artificial knee joint: Secondary | ICD-10-CM

## 2018-12-22 DIAGNOSIS — Z853 Personal history of malignant neoplasm of breast: Secondary | ICD-10-CM

## 2018-12-22 DIAGNOSIS — Z79899 Other long term (current) drug therapy: Secondary | ICD-10-CM

## 2018-12-22 DIAGNOSIS — K219 Gastro-esophageal reflux disease without esophagitis: Secondary | ICD-10-CM | POA: Diagnosis present

## 2018-12-22 DIAGNOSIS — F329 Major depressive disorder, single episode, unspecified: Secondary | ICD-10-CM | POA: Diagnosis not present

## 2018-12-22 DIAGNOSIS — Z7401 Bed confinement status: Secondary | ICD-10-CM | POA: Diagnosis not present

## 2018-12-22 DIAGNOSIS — J449 Chronic obstructive pulmonary disease, unspecified: Secondary | ICD-10-CM | POA: Diagnosis present

## 2018-12-22 DIAGNOSIS — Z9884 Bariatric surgery status: Secondary | ICD-10-CM | POA: Diagnosis not present

## 2018-12-22 DIAGNOSIS — M25562 Pain in left knee: Secondary | ICD-10-CM | POA: Diagnosis not present

## 2018-12-22 DIAGNOSIS — M797 Fibromyalgia: Secondary | ICD-10-CM | POA: Diagnosis present

## 2018-12-22 DIAGNOSIS — Z923 Personal history of irradiation: Secondary | ICD-10-CM

## 2018-12-22 DIAGNOSIS — M172 Bilateral post-traumatic osteoarthritis of knee: Secondary | ICD-10-CM | POA: Diagnosis not present

## 2018-12-22 DIAGNOSIS — I10 Essential (primary) hypertension: Secondary | ICD-10-CM | POA: Diagnosis present

## 2018-12-22 DIAGNOSIS — E669 Obesity, unspecified: Secondary | ICD-10-CM | POA: Diagnosis present

## 2018-12-22 DIAGNOSIS — G8918 Other acute postprocedural pain: Secondary | ICD-10-CM | POA: Diagnosis not present

## 2018-12-22 DIAGNOSIS — M6281 Muscle weakness (generalized): Secondary | ICD-10-CM | POA: Diagnosis not present

## 2018-12-22 DIAGNOSIS — Z888 Allergy status to other drugs, medicaments and biological substances status: Secondary | ICD-10-CM

## 2018-12-22 DIAGNOSIS — Z1159 Encounter for screening for other viral diseases: Secondary | ICD-10-CM | POA: Diagnosis not present

## 2018-12-22 DIAGNOSIS — M1712 Unilateral primary osteoarthritis, left knee: Principal | ICD-10-CM

## 2018-12-22 DIAGNOSIS — G4733 Obstructive sleep apnea (adult) (pediatric): Secondary | ICD-10-CM | POA: Diagnosis not present

## 2018-12-22 DIAGNOSIS — D638 Anemia in other chronic diseases classified elsewhere: Secondary | ICD-10-CM | POA: Diagnosis present

## 2018-12-22 DIAGNOSIS — G4700 Insomnia, unspecified: Secondary | ICD-10-CM | POA: Diagnosis not present

## 2018-12-22 DIAGNOSIS — F17210 Nicotine dependence, cigarettes, uncomplicated: Secondary | ICD-10-CM | POA: Diagnosis present

## 2018-12-22 DIAGNOSIS — M2550 Pain in unspecified joint: Secondary | ICD-10-CM | POA: Diagnosis not present

## 2018-12-22 HISTORY — PX: TOTAL KNEE ARTHROPLASTY: SHX125

## 2018-12-22 LAB — GLUCOSE, CAPILLARY: Glucose-Capillary: 111 mg/dL — ABNORMAL HIGH (ref 70–99)

## 2018-12-22 SURGERY — ARTHROPLASTY, KNEE, TOTAL
Anesthesia: Spinal | Site: Knee | Laterality: Left

## 2018-12-22 MED ORDER — CEFAZOLIN SODIUM-DEXTROSE 2-4 GM/100ML-% IV SOLN
2.0000 g | INTRAVENOUS | Status: AC
  Administered 2018-12-22: 2 g via INTRAVENOUS
  Filled 2018-12-22: qty 100

## 2018-12-22 MED ORDER — PROPOFOL 500 MG/50ML IV EMUL
INTRAVENOUS | Status: DC | PRN
  Administered 2018-12-22: 50 ug/kg/min via INTRAVENOUS

## 2018-12-22 MED ORDER — GLYCOPYRROLATE 0.2 MG/ML IJ SOLN
INTRAMUSCULAR | Status: DC | PRN
  Administered 2018-12-22 (×2): 0.1 mg via INTRAVENOUS

## 2018-12-22 MED ORDER — VANCOMYCIN HCL 1000 MG IV SOLR
INTRAVENOUS | Status: DC | PRN
  Administered 2018-12-22: 1000 mg via TOPICAL

## 2018-12-22 MED ORDER — ASPIRIN EC 81 MG PO TBEC: 81.0000 mg | DELAYED_RELEASE_TABLET | Freq: Two times a day (BID) | ORAL | 0 refills | Status: DC

## 2018-12-22 MED ORDER — LACTATED RINGERS IV SOLN: INTRAVENOUS | Status: DC

## 2018-12-22 MED ORDER — POLYETHYLENE GLYCOL 3350 17 G PO PACK: 17.0000 g | PACK | Freq: Every day | ORAL | Status: DC | PRN

## 2018-12-22 MED ORDER — SODIUM CHLORIDE 0.9 % IR SOLN
Status: DC | PRN
  Administered 2018-12-22: 3000 mL

## 2018-12-22 MED ORDER — PROMETHAZINE HCL 25 MG PO TABS: 25.0000 mg | ORAL_TABLET | Freq: Four times a day (QID) | ORAL | 1 refills | Status: DC | PRN

## 2018-12-22 MED ORDER — ONDANSETRON HCL 4 MG PO TABS: 4.0000 mg | ORAL_TABLET | Freq: Three times a day (TID) | ORAL | 0 refills | Status: DC | PRN

## 2018-12-22 MED ORDER — LAMOTRIGINE 100 MG PO TABS
100.0000 mg | ORAL_TABLET | Freq: Every day | ORAL | Status: DC
  Administered 2018-12-22 – 2018-12-26 (×5): 100 mg via ORAL
  Filled 2018-12-22 (×5): qty 1

## 2018-12-22 MED ORDER — CHLORHEXIDINE GLUCONATE 4 % EX LIQD: 60.0000 mL | Freq: Once | CUTANEOUS | Status: DC

## 2018-12-22 MED ORDER — KETOROLAC TROMETHAMINE 15 MG/ML IJ SOLN
30.0000 mg | Freq: Four times a day (QID) | INTRAMUSCULAR | Status: AC
  Administered 2018-12-22 – 2018-12-23 (×4): 30 mg via INTRAVENOUS
  Filled 2018-12-22 (×4): qty 2

## 2018-12-22 MED ORDER — POVIDONE-IODINE 10 % EX SWAB: 2.0000 "application " | Freq: Once | CUTANEOUS | Status: DC

## 2018-12-22 MED ORDER — ACETAMINOPHEN 325 MG PO TABS: 325.0000 mg | ORAL_TABLET | Freq: Four times a day (QID) | ORAL | Status: DC | PRN

## 2018-12-22 MED ORDER — BUPIVACAINE LIPOSOME 1.3 % IJ SUSP
INTRAMUSCULAR | Status: DC | PRN
  Administered 2018-12-22: 20 mL

## 2018-12-22 MED ORDER — OXYCODONE HCL 5 MG PO TABS
5.0000 mg | ORAL_TABLET | ORAL | Status: DC | PRN
  Administered 2018-12-22 – 2018-12-24 (×3): 10 mg via ORAL
  Filled 2018-12-22 (×3): qty 2

## 2018-12-22 MED ORDER — OXYCODONE HCL 5 MG PO TABS
10.0000 mg | ORAL_TABLET | ORAL | Status: DC | PRN
  Administered 2018-12-22 – 2018-12-23 (×2): 15 mg via ORAL
  Filled 2018-12-22 (×2): qty 3

## 2018-12-22 MED ORDER — PANTOPRAZOLE SODIUM 40 MG PO TBEC
40.0000 mg | DELAYED_RELEASE_TABLET | Freq: Every day | ORAL | Status: DC
  Administered 2018-12-22 – 2018-12-26 (×5): 40 mg via ORAL
  Filled 2018-12-22 (×5): qty 1

## 2018-12-22 MED ORDER — ACETAMINOPHEN 160 MG/5ML PO SOLN: 325.0000 mg | Freq: Once | ORAL | Status: DC | PRN

## 2018-12-22 MED ORDER — ASPIRIN 81 MG PO CHEW
81.0000 mg | CHEWABLE_TABLET | Freq: Two times a day (BID) | ORAL | Status: DC
  Administered 2018-12-22 – 2018-12-26 (×8): 81 mg via ORAL
  Filled 2018-12-22 (×8): qty 1

## 2018-12-22 MED ORDER — 0.9 % SODIUM CHLORIDE (POUR BTL) OPTIME
TOPICAL | Status: DC | PRN
  Administered 2018-12-22: 1000 mL

## 2018-12-22 MED ORDER — METOCLOPRAMIDE HCL 5 MG PO TABS: 5.0000 mg | ORAL_TABLET | Freq: Three times a day (TID) | ORAL | Status: DC | PRN

## 2018-12-22 MED ORDER — ALUM & MAG HYDROXIDE-SIMETH 200-200-20 MG/5ML PO SUSP: 30.0000 mL | ORAL | Status: DC | PRN

## 2018-12-22 MED ORDER — ACETAMINOPHEN 10 MG/ML IV SOLN: 1000.0000 mg | Freq: Once | INTRAVENOUS | Status: DC | PRN

## 2018-12-22 MED ORDER — PROPOFOL 10 MG/ML IV BOLUS
INTRAVENOUS | Status: DC | PRN
  Administered 2018-12-22: 30 mg via INTRAVENOUS

## 2018-12-22 MED ORDER — SODIUM CHLORIDE 0.9% FLUSH
INTRAVENOUS | Status: DC | PRN
  Administered 2018-12-22 (×2): 10 mL

## 2018-12-22 MED ORDER — VANCOMYCIN HCL 1000 MG IV SOLR
INTRAVENOUS | Status: AC
  Filled 2018-12-22: qty 1000

## 2018-12-22 MED ORDER — BUPIVACAINE IN DEXTROSE 0.75-8.25 % IT SOLN
INTRATHECAL | Status: DC | PRN
  Administered 2018-12-22: 2 mL via INTRATHECAL

## 2018-12-22 MED ORDER — AMLODIPINE BESYLATE 5 MG PO TABS
5.0000 mg | ORAL_TABLET | Freq: Every day | ORAL | Status: DC
  Administered 2018-12-23 – 2018-12-26 (×4): 5 mg via ORAL
  Filled 2018-12-22 (×4): qty 1

## 2018-12-22 MED ORDER — DEXAMETHASONE SODIUM PHOSPHATE 10 MG/ML IJ SOLN
10.0000 mg | Freq: Once | INTRAMUSCULAR | Status: AC
  Administered 2018-12-23: 10 mg via INTRAVENOUS
  Filled 2018-12-22: qty 1

## 2018-12-22 MED ORDER — BUPIVACAINE LIPOSOME 1.3 % IJ SUSP
20.0000 mL | Freq: Once | INTRAMUSCULAR | Status: DC
  Filled 2018-12-22: qty 20

## 2018-12-22 MED ORDER — METOCLOPRAMIDE HCL 5 MG/ML IJ SOLN: 5.0000 mg | Freq: Three times a day (TID) | INTRAMUSCULAR | Status: DC | PRN

## 2018-12-22 MED ORDER — METHOCARBAMOL 500 MG PO TABS
500.0000 mg | ORAL_TABLET | Freq: Four times a day (QID) | ORAL | Status: DC | PRN
  Administered 2018-12-23 – 2018-12-24 (×2): 500 mg via ORAL
  Filled 2018-12-22 (×2): qty 1

## 2018-12-22 MED ORDER — SORBITOL 70 % SOLN: 30.0000 mL | Freq: Every day | Status: DC | PRN

## 2018-12-22 MED ORDER — QUETIAPINE FUMARATE 25 MG PO TABS
25.0000 mg | ORAL_TABLET | Freq: Every evening | ORAL | Status: DC | PRN
  Filled 2018-12-22: qty 1

## 2018-12-22 MED ORDER — CELECOXIB 200 MG PO CAPS: 200.0000 mg | ORAL_CAPSULE | Freq: Two times a day (BID) | ORAL | Status: DC

## 2018-12-22 MED ORDER — BUPIVACAINE-EPINEPHRINE (PF) 0.25% -1:200000 IJ SOLN
INTRAMUSCULAR | Status: AC
  Filled 2018-12-22: qty 30

## 2018-12-22 MED ORDER — ONDANSETRON HCL 4 MG/2ML IJ SOLN
4.0000 mg | Freq: Four times a day (QID) | INTRAMUSCULAR | Status: DC | PRN
  Administered 2018-12-25: 4 mg via INTRAVENOUS
  Filled 2018-12-22: qty 2

## 2018-12-22 MED ORDER — MAGNESIUM CITRATE PO SOLN: 1.0000 | Freq: Once | ORAL | Status: DC | PRN

## 2018-12-22 MED ORDER — PHENOL 1.4 % MT LIQD: 1.0000 | OROMUCOSAL | Status: DC | PRN

## 2018-12-22 MED ORDER — MIDAZOLAM HCL 5 MG/5ML IJ SOLN
INTRAMUSCULAR | Status: DC | PRN
  Administered 2018-12-22 (×2): 1 mg via INTRAVENOUS

## 2018-12-22 MED ORDER — DOCUSATE SODIUM 100 MG PO CAPS
100.0000 mg | ORAL_CAPSULE | Freq: Two times a day (BID) | ORAL | Status: DC
  Administered 2018-12-22 – 2018-12-26 (×8): 100 mg via ORAL
  Filled 2018-12-22 (×8): qty 1

## 2018-12-22 MED ORDER — MIDAZOLAM HCL 2 MG/2ML IJ SOLN
1.0000 mg | Freq: Once | INTRAMUSCULAR | Status: AC
  Administered 2018-12-22: 09:00:00 1 mg via INTRAVENOUS

## 2018-12-22 MED ORDER — HYDROMORPHONE HCL 1 MG/ML IJ SOLN
0.5000 mg | INTRAMUSCULAR | Status: DC | PRN
  Administered 2018-12-22: 1 mg via INTRAVENOUS
  Filled 2018-12-22: qty 1

## 2018-12-22 MED ORDER — BUPIVACAINE-EPINEPHRINE 0.25% -1:200000 IJ SOLN
INTRAMUSCULAR | Status: DC | PRN
  Administered 2018-12-22: 20 mL

## 2018-12-22 MED ORDER — PROMETHAZINE HCL 25 MG/ML IJ SOLN: 6.2500 mg | INTRAMUSCULAR | Status: DC | PRN

## 2018-12-22 MED ORDER — TRANEXAMIC ACID-NACL 1000-0.7 MG/100ML-% IV SOLN
1000.0000 mg | Freq: Once | INTRAVENOUS | Status: AC
  Administered 2018-12-22: 15:00:00 1000 mg via INTRAVENOUS
  Filled 2018-12-22: qty 100

## 2018-12-22 MED ORDER — OXYCODONE HCL ER 10 MG PO T12A
10.0000 mg | EXTENDED_RELEASE_TABLET | Freq: Two times a day (BID) | ORAL | Status: DC
  Administered 2018-12-22 – 2018-12-25 (×7): 10 mg via ORAL
  Filled 2018-12-22 (×8): qty 1

## 2018-12-22 MED ORDER — ACETAMINOPHEN 500 MG PO TABS
1000.0000 mg | ORAL_TABLET | Freq: Four times a day (QID) | ORAL | Status: AC
  Administered 2018-12-22 – 2018-12-23 (×4): 1000 mg via ORAL
  Filled 2018-12-22 (×4): qty 2

## 2018-12-22 MED ORDER — MIDAZOLAM HCL 2 MG/2ML IJ SOLN
INTRAMUSCULAR | Status: AC
  Administered 2018-12-22: 1 mg via INTRAVENOUS
  Filled 2018-12-22: qty 2

## 2018-12-22 MED ORDER — PNEUMOCOCCAL VAC POLYVALENT 25 MCG/0.5ML IJ INJ: 0.5000 mL | INJECTION | INTRAMUSCULAR | Status: DC | PRN

## 2018-12-22 MED ORDER — SODIUM CHLORIDE 0.9 % IV SOLN
INTRAVENOUS | Status: DC
  Administered 2018-12-22 – 2018-12-23 (×2): via INTRAVENOUS

## 2018-12-22 MED ORDER — TRANEXAMIC ACID-NACL 1000-0.7 MG/100ML-% IV SOLN
1000.0000 mg | INTRAVENOUS | Status: AC
  Administered 2018-12-22: 1000 mg via INTRAVENOUS
  Filled 2018-12-22: qty 100

## 2018-12-22 MED ORDER — ONDANSETRON HCL 4 MG/2ML IJ SOLN
INTRAMUSCULAR | Status: DC | PRN
  Administered 2018-12-22: 4 mg via INTRAVENOUS

## 2018-12-22 MED ORDER — TRANEXAMIC ACID 1000 MG/10ML IV SOLN
INTRAVENOUS | Status: DC | PRN
  Administered 2018-12-22: 11:00:00 2000 mg via TOPICAL

## 2018-12-22 MED ORDER — ACETAMINOPHEN 325 MG PO TABS: 325.0000 mg | ORAL_TABLET | Freq: Once | ORAL | Status: DC | PRN

## 2018-12-22 MED ORDER — MENTHOL 3 MG MT LOZG: 1.0000 | LOZENGE | OROMUCOSAL | Status: DC | PRN

## 2018-12-22 MED ORDER — VALACYCLOVIR HCL 500 MG PO TABS
500.0000 mg | ORAL_TABLET | Freq: Two times a day (BID) | ORAL | Status: DC | PRN
  Filled 2018-12-22: qty 1

## 2018-12-22 MED ORDER — CARVEDILOL 3.125 MG PO TABS
3.1250 mg | ORAL_TABLET | Freq: Two times a day (BID) | ORAL | Status: DC
  Administered 2018-12-22 – 2018-12-26 (×9): 3.125 mg via ORAL
  Filled 2018-12-22 (×9): qty 1

## 2018-12-22 MED ORDER — SULFAMETHOXAZOLE-TRIMETHOPRIM 800-160 MG PO TABS: 1.0000 | ORAL_TABLET | Freq: Two times a day (BID) | ORAL | 0 refills | Status: DC

## 2018-12-22 MED ORDER — METHOCARBAMOL 1000 MG/10ML IJ SOLN
500.0000 mg | Freq: Four times a day (QID) | INTRAVENOUS | Status: DC | PRN
  Filled 2018-12-22: qty 5

## 2018-12-22 MED ORDER — DIPHENHYDRAMINE HCL 12.5 MG/5ML PO ELIX: 25.0000 mg | ORAL_SOLUTION | ORAL | Status: DC | PRN

## 2018-12-22 MED ORDER — FENTANYL CITRATE (PF) 100 MCG/2ML IJ SOLN
50.0000 ug | Freq: Once | INTRAMUSCULAR | Status: AC
  Administered 2018-12-22: 09:00:00 50 ug via INTRAVENOUS

## 2018-12-22 MED ORDER — FENTANYL CITRATE (PF) 100 MCG/2ML IJ SOLN
INTRAMUSCULAR | Status: AC
  Filled 2018-12-22: qty 2

## 2018-12-22 MED ORDER — OXYCODONE HCL 5 MG PO TABS: 5.0000 mg | ORAL_TABLET | Freq: Four times a day (QID) | ORAL | 0 refills | Status: DC | PRN

## 2018-12-22 MED ORDER — ONDANSETRON HCL 4 MG PO TABS: 4.0000 mg | ORAL_TABLET | Freq: Four times a day (QID) | ORAL | Status: DC | PRN

## 2018-12-22 MED ORDER — HYDROMORPHONE HCL 1 MG/ML IJ SOLN: 0.2500 mg | INTRAMUSCULAR | Status: DC | PRN

## 2018-12-22 MED ORDER — METHOCARBAMOL 750 MG PO TABS: 750.0000 mg | ORAL_TABLET | Freq: Two times a day (BID) | ORAL | 0 refills | Status: DC | PRN

## 2018-12-22 MED ORDER — PROPOFOL 10 MG/ML IV BOLUS
INTRAVENOUS | Status: AC
  Filled 2018-12-22: qty 20

## 2018-12-22 MED ORDER — LORATADINE 10 MG PO TABS
10.0000 mg | ORAL_TABLET | Freq: Every day | ORAL | Status: DC
  Administered 2018-12-23 – 2018-12-26 (×4): 10 mg via ORAL
  Filled 2018-12-22 (×4): qty 1

## 2018-12-22 MED ORDER — OXYCODONE HCL ER 10 MG PO T12A: 10.0000 mg | EXTENDED_RELEASE_TABLET | Freq: Two times a day (BID) | ORAL | 0 refills | Status: AC

## 2018-12-22 MED ORDER — MEPERIDINE HCL 25 MG/ML IJ SOLN: 6.2500 mg | INTRAMUSCULAR | Status: DC | PRN

## 2018-12-22 MED ORDER — MIDAZOLAM HCL 2 MG/2ML IJ SOLN
INTRAMUSCULAR | Status: AC
  Filled 2018-12-22: qty 2

## 2018-12-22 MED ORDER — FENTANYL CITRATE (PF) 100 MCG/2ML IJ SOLN
INTRAMUSCULAR | Status: AC
  Administered 2018-12-22: 09:00:00 50 ug via INTRAVENOUS
  Filled 2018-12-22: qty 2

## 2018-12-22 MED ORDER — DEXMEDETOMIDINE HCL IN NACL 400 MCG/100ML IV SOLN
INTRAVENOUS | Status: DC | PRN
  Administered 2018-12-22: 12 ug via INTRAVENOUS

## 2018-12-22 MED ORDER — LACTATED RINGERS IV SOLN
INTRAVENOUS | Status: DC
  Administered 2018-12-22: 08:00:00 via INTRAVENOUS

## 2018-12-22 MED ORDER — CEFAZOLIN SODIUM-DEXTROSE 2-4 GM/100ML-% IV SOLN
2.0000 g | Freq: Four times a day (QID) | INTRAVENOUS | Status: AC
  Administered 2018-12-22 – 2018-12-23 (×3): 2 g via INTRAVENOUS
  Filled 2018-12-22 (×3): qty 100

## 2018-12-22 MED ORDER — LIDOCAINE 2% (20 MG/ML) 5 ML SYRINGE
INTRAMUSCULAR | Status: DC | PRN
  Administered 2018-12-22: 20 mg via INTRAVENOUS

## 2018-12-22 MED ORDER — GABAPENTIN 300 MG PO CAPS
300.0000 mg | ORAL_CAPSULE | Freq: Three times a day (TID) | ORAL | Status: DC
  Administered 2018-12-22 – 2018-12-26 (×12): 300 mg via ORAL
  Filled 2018-12-22 (×12): qty 1

## 2018-12-22 MED ORDER — VITAMIN C 500 MG PO TABS
500.0000 mg | ORAL_TABLET | Freq: Every day | ORAL | Status: DC
  Administered 2018-12-23 – 2018-12-26 (×4): 500 mg via ORAL
  Filled 2018-12-22 (×4): qty 1

## 2018-12-22 MED ORDER — SODIUM CHLORIDE 0.9 % IV SOLN
INTRAVENOUS | Status: DC | PRN
  Administered 2018-12-22: 20 ug/min via INTRAVENOUS

## 2018-12-22 MED ORDER — CELECOXIB 200 MG PO CAPS
200.0000 mg | ORAL_CAPSULE | Freq: Two times a day (BID) | ORAL | Status: DC
  Administered 2018-12-23 – 2018-12-26 (×6): 200 mg via ORAL
  Filled 2018-12-22 (×6): qty 1

## 2018-12-22 MED ORDER — SENNOSIDES-DOCUSATE SODIUM 8.6-50 MG PO TABS: 1.0000 | ORAL_TABLET | Freq: Every evening | ORAL | 1 refills | Status: DC | PRN

## 2018-12-22 MED ORDER — ROPIVACAINE HCL 7.5 MG/ML IJ SOLN
INTRAMUSCULAR | Status: DC | PRN
  Administered 2018-12-22: 20 mL via PERINEURAL

## 2018-12-22 SURGICAL SUPPLY — 74 items
ALCOHOL ISOPROPYL (RUBBING) (MISCELLANEOUS) ×3 IMPLANT
BAG DECANTER FOR FLEXI CONT (MISCELLANEOUS) ×3 IMPLANT
BANDAGE ESMARK 6X9 LF (GAUZE/BANDAGES/DRESSINGS) ×1 IMPLANT
BLADE SAW SGTL 13.0X1.19X90.0M (BLADE) ×3 IMPLANT
BNDG ELASTIC 6X10 VLCR STRL LF (GAUZE/BANDAGES/DRESSINGS) ×3 IMPLANT
BNDG ESMARK 6X9 LF (GAUZE/BANDAGES/DRESSINGS) ×3
BOWL SMART MIX CTS (DISPOSABLE) ×3 IMPLANT
CEMENT BONE R 1X40 (Cement) ×6 IMPLANT
CLOSURE STERI-STRIP 1/2X4 (GAUZE/BANDAGES/DRESSINGS) ×1
CLSR STERI-STRIP ANTIMIC 1/2X4 (GAUZE/BANDAGES/DRESSINGS) ×2 IMPLANT
COVER SURGICAL LIGHT HANDLE (MISCELLANEOUS) ×3 IMPLANT
COVER WAND RF STERILE (DRAPES) ×3 IMPLANT
CUFF TOURN SGL QUICK 34 (TOURNIQUET CUFF) ×2
CUFF TOURNIQUET SINGLE 44IN (TOURNIQUET CUFF) IMPLANT
CUFF TRNQT CYL 34X4.125X (TOURNIQUET CUFF) ×1 IMPLANT
DRAPE EXTREMITY T 121X128X90 (DISPOSABLE) ×3 IMPLANT
DRAPE HALF SHEET 40X57 (DRAPES) ×3 IMPLANT
DRAPE INCISE IOBAN 66X45 STRL (DRAPES) IMPLANT
DRAPE ORTHO SPLIT 77X108 STRL (DRAPES) ×4
DRAPE POUCH INSTRU U-SHP 10X18 (DRAPES) ×3 IMPLANT
DRAPE SURG ORHT 6 SPLT 77X108 (DRAPES) ×2 IMPLANT
DRAPE U-SHAPE 47X51 STRL (DRAPES) ×6 IMPLANT
DRILL PIN HEADLESS TROCAR 3X75 (PIN) ×3 IMPLANT
DURAPREP 26ML APPLICATOR (WOUND CARE) ×6 IMPLANT
ELECT CAUTERY BLADE 6.4 (BLADE) ×3 IMPLANT
ELECT REM PT RETURN 9FT ADLT (ELECTROSURGICAL) ×3
ELECTRODE REM PT RTRN 9FT ADLT (ELECTROSURGICAL) ×1 IMPLANT
FEMORAL KNEE COMP SZ 8 STND LT (Knees) ×3 IMPLANT
FEMORAL KNEE COMP SZ 8STD LT (Knees) ×1 IMPLANT
GAUZE SPONGE 4X4 12PLY STRL LF (GAUZE/BANDAGES/DRESSINGS) ×3 IMPLANT
GLOVE BIOGEL PI IND STRL 7.0 (GLOVE) ×1 IMPLANT
GLOVE BIOGEL PI INDICATOR 7.0 (GLOVE) ×2
GLOVE ECLIPSE 7.0 STRL STRAW (GLOVE) ×9 IMPLANT
GLOVE SKINSENSE NS SZ7.5 (GLOVE) ×2
GLOVE SKINSENSE STRL SZ7.5 (GLOVE) ×1 IMPLANT
GLOVE SURG SYN 7.5  E (GLOVE) ×8
GLOVE SURG SYN 7.5 E (GLOVE) ×4 IMPLANT
GOWN STRL REIN XL XLG (GOWN DISPOSABLE) ×3 IMPLANT
GOWN STRL REUS W/ TWL LRG LVL3 (GOWN DISPOSABLE) ×1 IMPLANT
GOWN STRL REUS W/TWL LRG LVL3 (GOWN DISPOSABLE) ×2
HANDPIECE INTERPULSE COAX TIP (DISPOSABLE) ×2
HOOD PEEL AWAY FLYTE STAYCOOL (MISCELLANEOUS) ×6 IMPLANT
KIT BASIN OR (CUSTOM PROCEDURE TRAY) ×3 IMPLANT
KIT TURNOVER KIT B (KITS) ×3 IMPLANT
MANIFOLD NEPTUNE II (INSTRUMENTS) ×3 IMPLANT
MARKER SKIN DUAL TIP RULER LAB (MISCELLANEOUS) ×3 IMPLANT
NEEDLE SPNL 18GX3.5 QUINCKE PK (NEEDLE) ×6 IMPLANT
NS IRRIG 1000ML POUR BTL (IV SOLUTION) ×3 IMPLANT
PACK TOTAL JOINT (CUSTOM PROCEDURE TRAY) ×3 IMPLANT
PAD ABD 8X10 STRL (GAUZE/BANDAGES/DRESSINGS) ×2 IMPLANT
PAD ARMBOARD 7.5X6 YLW CONV (MISCELLANEOUS) ×6 IMPLANT
PADDING CAST COTTON 6X4 STRL (CAST SUPPLIES) ×3 IMPLANT
SAW OSC TIP CART 19.5X105X1.3 (SAW) ×3 IMPLANT
SET HNDPC FAN SPRY TIP SCT (DISPOSABLE) ×1 IMPLANT
STAPLER VISISTAT 35W (STAPLE) IMPLANT
STEM POLY PAT PLY 32M KNEE (Knees) ×3 IMPLANT
STEM TIBIA 5 DEG SZ E L KNEE (Knees) ×1 IMPLANT
STEM TIBIAL 10 8-11 EF POLY LT (Joint) ×3 IMPLANT
SUCTION FRAZIER HANDLE 10FR (MISCELLANEOUS) ×2
SUCTION TUBE FRAZIER 10FR DISP (MISCELLANEOUS) ×1 IMPLANT
SUT ETHILON 2 0 FS 18 (SUTURE) IMPLANT
SUT MNCRL AB 4-0 PS2 18 (SUTURE) IMPLANT
SUT VIC AB 0 CT1 27 (SUTURE) ×4
SUT VIC AB 0 CT1 27XBRD ANBCTR (SUTURE) ×2 IMPLANT
SUT VIC AB 1 CTX 27 (SUTURE) ×9 IMPLANT
SUT VIC AB 2-0 CT1 27 (SUTURE) ×8
SUT VIC AB 2-0 CT1 TAPERPNT 27 (SUTURE) ×4 IMPLANT
SYR 50ML LL SCALE MARK (SYRINGE) ×6 IMPLANT
TIBIA STEM 5 DEG SZ E L KNEE (Knees) ×3 IMPLANT
TOWEL GREEN STERILE FF (TOWEL DISPOSABLE) ×3 IMPLANT
TOWEL OR 17X26 10 PK STRL BLUE (TOWEL DISPOSABLE) ×3 IMPLANT
TRAY CATH 16FR W/PLASTIC CATH (SET/KITS/TRAYS/PACK) IMPLANT
UNDERPAD 30X30 (UNDERPADS AND DIAPERS) ×3 IMPLANT
WRAP KNEE MAXI GEL POST OP (GAUZE/BANDAGES/DRESSINGS) ×3 IMPLANT

## 2018-12-22 NOTE — Progress Notes (Signed)
Pt arrived to the floor from PACU. Pt alert, and oriented. able make needs known. CPM is on and running. Pt denies pain at this time. Continue to monitor

## 2018-12-22 NOTE — Evaluation (Signed)
Physical Therapy Evaluation Patient Details Name: Rachel Woodard MRN: [id number] DOB: 1951/06/14 Today's Date: 12/22/2018   History of Present Illness  Pt is a 68 y/o female s/p L TKA. PMH includes breast cancer, COPD, fibromyalgia, HTN, pre-diabetes, and sleep apnea on CPAP.   Clinical Impression  Pt is s/p surgery above with deficits below. Attempted transfer to chair, however, pt with significant L knee buckling and requiring max A to prevent fall. Anticipate pt will progress well once nerve block resolved. Reviewed supine HEP. Pt reports she would like to go to SNF at d/c prior to return home to her recovery house. Will continue to follow acutely to maximize functional mobility independence and safety.     Follow Up Recommendations Follow surgeon's recommendation for DC plan and follow-up therapies;Supervision for mobility/OOB    Equipment Recommendations  3in1 (PT)    Recommendations for Other Services OT consult     Precautions / Restrictions Precautions Precautions: Knee Precaution Booklet Issued: No Precaution Comments: Reviewed knee precautions with pt.  Restrictions Weight Bearing Restrictions: Yes LLE Weight Bearing: Weight bearing as tolerated      Mobility  Bed Mobility Overal bed mobility: Needs Assistance Bed Mobility: Supine to Sit;Sit to Supine     Supine to sit: Supervision Sit to supine: Supervision   General bed mobility comments: Supervision for safety. Increased time required.   Transfers Overall transfer level: Needs assistance Equipment used: Rolling walker (2 wheeled) Transfers: Sit to/from Stand Sit to Stand: Min assist;From elevated surface         General transfer comment: Min A for lift assist to stand from elevated surface. Attempted to transfer to chair X2, however, pt with significant knee buckling and requiring max A to prevent fall, so unable to perform transfer.   Ambulation/Gait                Stairs             Wheelchair Mobility    Modified Rankin (Stroke Patients Only)       Balance Overall balance assessment: Needs assistance Sitting-balance support: No upper extremity supported;Feet supported Sitting balance-Leahy Scale: Good     Standing balance support: Bilateral upper extremity supported;During functional activity Standing balance-Leahy Scale: Poor Standing balance comment: Reliant on BUE support                              Pertinent Vitals/Pain Pain Assessment: Faces Faces Pain Scale: Hurts little more Pain Location: L knee  Pain Descriptors / Indicators: Aching;Operative site guarding Pain Intervention(s): Limited activity within patient's tolerance;Monitored during session;Repositioned    Home Living Family/patient expects to be discharged to:: Private residence Living Arrangements: Non-relatives/Friends Available Help at Discharge: Friend(s) Type of Home: House Home Access: Stairs to enter Entrance Stairs-Rails: Right;Left;Can reach both Entrance Stairs-Number of Steps: 5 Home Layout: One level Home Equipment: Environmental consultant - 2 wheels;Cane - single point Additional Comments: Lives in a recovery house     Prior Function Level of Independence: Independent with assistive device(s)         Comments: Has been using cane for ambulation      Hand Dominance        Extremity/Trunk Assessment   Upper Extremity Assessment Upper Extremity Assessment: Defer to OT evaluation    Lower Extremity Assessment Lower Extremity Assessment: LLE deficits/detail LLE Deficits / Details: Deficits consistent with post op pain and weakness. Pt reporting some numbness, and pt with notable knee buckling  when attempting transfers.  LLE Sensation: decreased light touch LLE Coordination: decreased gross motor    Cervical / Trunk Assessment Cervical / Trunk Assessment: Normal  Communication   Communication: No difficulties  Cognition Arousal/Alertness: Suspect due to  medications Behavior During Therapy: WFL for tasks assessed/performed Overall Cognitive Status: Within Functional Limits for tasks assessed                                        General Comments      Exercises Total Joint Exercises Ankle Circles/Pumps: AROM;Both;20 reps;Supine Quad Sets: AROM;Left;10 reps;Supine   Assessment/Plan    PT Assessment Patient needs continued PT services  PT Problem List Decreased strength;Decreased range of motion;Decreased balance;Decreased mobility;Decreased coordination;Decreased knowledge of use of DME;Decreased knowledge of precautions       PT Treatment Interventions DME instruction;Gait training;Stair training;Functional mobility training;Therapeutic activities;Therapeutic exercise;Balance training;Patient/family education    PT Goals (Current goals can be found in the Care Plan section)  Acute Rehab PT Goals Patient Stated Goal: to go to SNF and then go back home PT Goal Formulation: With patient Time For Goal Achievement: 01/05/19 Potential to Achieve Goals: Good    Frequency 7X/week   Barriers to discharge        Co-evaluation               AM-PAC PT "6 Clicks" Mobility  Outcome Measure Help needed turning from your back to your side while in a flat bed without using bedrails?: None Help needed moving from lying on your back to sitting on the side of a flat bed without using bedrails?: A Little Help needed moving to and from a bed to a chair (including a wheelchair)?: A Lot Help needed standing up from a chair using your arms (e.g., wheelchair or bedside chair)?: A Little Help needed to walk in hospital room?: A Lot Help needed climbing 3-5 steps with a railing? : A Lot 6 Click Score: 16    End of Session Equipment Utilized During Treatment: Gait belt Activity Tolerance: Patient tolerated treatment well Patient left: in bed;with call bell/phone within reach Nurse Communication: Mobility status PT Visit  Diagnosis: Unsteadiness on feet (R26.81);Muscle weakness (generalized) (M62.81);Difficulty in walking, not elsewhere classified (R26.2)    Time: 1640-1700 PT Time Calculation (min) (ACUTE ONLY): 20 min   Charges:   PT Evaluation $PT Eval Low Complexity: Coats, PT, DPT  Acute Rehabilitation Services  Pager: [phone] Office: [phone]   Rudean Hitt 12/22/2018, 5:32 PM

## 2018-12-22 NOTE — Op Note (Signed)
Total Knee Arthroplasty Procedure Note  Preoperative diagnosis: Left knee osteoarthritis  Postoperative diagnosis:same  Operative procedure: Left total knee arthroplasty. CPT [phone]  Surgeon: N. Eduard Roux, MD  Assist: Madalyn Rob, PA-C; necessary for the timely completion of procedure and due to complexity of procedure.  Anesthesia: Spinal, regional  Tourniquet time: see anesthesia record  Implants used: Zimmer persona Femur: CR 8 Tibia: E Patella: 32 mm Polyethylene: 10 mm, MC  Indication: Rachel Woodard is a 68 y.o. year old female with a history of knee pain. Having failed conservative management, the patient elected to proceed with a total knee arthroplasty.  We have reviewed the risk and benefits of the surgery and they elected to proceed after voicing understanding.  Procedure:  After informed consent was obtained and understanding of the risk were voiced including but not limited to bleeding, infection, damage to surrounding structures including nerves and vessels, blood clots, leg length inequality and the failure to achieve desired results, the operative extremity was marked with verbal confirmation of the patient in the holding area.   The patient was then brought to the operating room and transported to the operating room table in the supine position.  A tourniquet was applied to the operative extremity around the upper thigh. The operative limb was then prepped and draped in the usual sterile fashion and preoperative antibiotics were administered.  A time out was performed prior to the start of surgery confirming the correct extremity, preoperative antibiotic administration, as well as team members, implants and instruments available for the case. Correct surgical site was also confirmed with preoperative radiographs. The limb was then elevated for exsanguination and the tourniquet was inflated. A midline incision was made and a standard medial  parapatellar approach was performed.  The patella was prepared and sized to a 32 mm.  A cover was placed on the patella for protection from retractors.  We then turned our attention to the femur. Posterior cruciate ligament was sacrificed. Start site was drilled in the femur and the intramedullary distal femoral cutting guide was placed, set at 5 degrees valgus, taking 10 mm of distal resection. The distal cut was made. Osteophytes were then removed.  Extension gap was then checked. Next, the proximal tibial cutting guide was placed with appropriate slope, varus/valgus alignment and depth of resection. The proximal tibial cut was made. Gap blocks were then used to assess the extension gap and alignment, and appropriate soft tissue releases were performed. Attention was turned back to the femur, which was sized using the sizing guide to a size 8. Appropriate rotation of the femoral component was determined using epicondylar axis, Whiteside's line, and assessing the flexion gap under ligament tension. The appropriate size 4-in-1 cutting block was placed and cuts were made. Posterior femoral osteophytes and uncapped bone were then removed with the curved osteotome.  Trial components were placed, and stability was checked in full extension, mid-flexion, and deep flexion. Proper tibial rotation was determined and marked.  The patella tracked well without a lateral release. Trial components were then removed and tibial preparation performed.  The tibia was sized for a size E component.  A posterior capsular injection comprising of 20 cc of 1.3% exparel, 20 cc of 0.25% bupivicaine with epi and 20 cc of normal saline was performed for postoperative pain control. The bony surfaces were irrigated with a pulse lavage and then dried. Bone cement was vacuum mixed on the back table, and the final components sized above were cemented into place.  After cement had finished curing, excess cement was removed. The stability of the  construct was re-evaluated throughout a range of motion and found to be acceptable. The trial liner was removed, the knee was copiously irrigated, and the knee was re-evaluated for any excess bone debris. The real polyethylene liner, 10 mm thick, was inserted and checked to ensure the locking mechanism had engaged appropriately. The tourniquet was deflated and hemostasis was achieved. The wound was irrigated with normal saline.  One gram of vancomycin powder was placed in the surgical bed.  Capsular closure was performed with a #1 vicryl, subcutaneous fat closed with a 0 vicryl suture, then subcutaneous tissue closed with interrupted 2.0 vicryl suture. The skin was then closed with a 4.0 monocryl. A sterile dressing was applied.  The patient was awakened in the operating room and taken to recovery in stable condition. All sponge, needle, and instrument counts were correct at the end of the case.  Position: supine  Complications: none.  Time Out: performed   Drains/Packing: none  Estimated blood loss: minimal  Returned to Recovery Room: in good condition.   Antibiotics: yes   Mechanical VTE (DVT) Prophylaxis: sequential compression devices, TED thigh-high  Chemical VTE (DVT) Prophylaxis: aspirin  Fluid Replacement  Crystalloid: see anesthesia record Blood: none  FFP: none   Specimens Removed: 1 to pathology   Sponge and Instrument Count Correct? yes   PACU: portable radiograph - knee AP and Lateral   Plan/RTC: Return in 2 weeks for wound check.   Weight Bearing/Load Lower Extremity: full   N. Eduard Roux, MD [phone] 11:40 AM

## 2018-12-22 NOTE — H&P (Signed)
PREOPERATIVE H&P  Chief Complaint: left knee degenerative joint disease  HPI: Rachel Woodard is a 68 y.o. female who presents for surgical treatment of left knee degenerative joint disease.  She denies any changes in medical history.  Past Medical History:  Diagnosis Date  . Anemia 2001   after gastric bypass  . Anxiety   . Arthritis   . Breast cancer ([place]) 2013   Breast cancer    Radiation therapy  . COPD (chronic obstructive pulmonary disease) ([place])   . Depression   . Fibromyalgia   . GERD (gastroesophageal reflux disease)   . Hypertension   . Personal history of radiation therapy 2013  . Pneumonia   . Pre-diabetes   . PTSD (post-traumatic stress disorder)   . Sleep apnea    uses CPAP   Past Surgical History:  Procedure Laterality Date  . ABDOMINAL HYSTERECTOMY  2018  . BREAST LUMPECTOMY Right 2013  . GASTRIC BYPASS  2001   Social History   Socioeconomic History  . Marital status: Single    Spouse name: Not on file  . Number of children: Not on file  . Years of education: Not on file  . Highest education level: Not on file  Occupational History  . Not on file  Social Needs  . Financial resource strain: Not on file  . Food insecurity    Worry: Not on file    Inability: Not on file  . Transportation needs    Medical: Not on file    Non-medical: Not on file  Tobacco Use  . Smoking status: Current Every Day Smoker    Packs/day: 0.50    Types: Cigarettes  . Smokeless tobacco: Never Used  Substance and Sexual Activity  . Alcohol use: Yes    Comment: seldom  . Drug use: No    Types: Cocaine    Comment: clean x 60 days on 11/28/15  . Sexual activity: Yes  Lifestyle  . Physical activity    Days per week: Not on file    Minutes per session: Not on file  . Stress: Not on file  Relationships  . Social Herbalist on phone: Not on file    Gets together: Not on file    Attends religious service: Not on file    Active member of club or  organization: Not on file    Attends meetings of clubs or organizations: Not on file    Relationship status: Not on file  Other Topics Concern  . 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 Allergies  Allergen Reactions  . Lisinopril Anaphylaxis  . Metformin And Related Diarrhea   Prior to Admission medications   Medication Sig Start Date End Date Taking? Authorizing Provider  amLODipine (NORVASC) 5 MG tablet Take 1 tablet (5 mg total) by mouth daily. Dose dec 11/11/2018 11/11/18  Yes Ladell Pier, MD  hydrochlorothiazide (HYDRODIURIL) 25 MG tablet Take 1 tablet (25 mg total) by mouth daily. 11/11/18  Yes Ladell Pier, MD  lamoTRIgine (LAMICTAL) 100 MG tablet Take 100 mg by mouth daily. 11/13/18  Yes [provider]  loratadine (CLARITIN) 10 MG tablet Take 10 mg by mouth daily.   Yes [provider]  pantoprazole (PROTONIX) 40 MG tablet Take 40 mg by mouth every morning. 08/12/18  Yes [provider]  QUEtiapine (SEROQUEL) 50 MG tablet Take 25 mg by mouth at bedtime as needed (sleep).  07/16/18  Yes [provider]  valACYclovir (VALTREX) 500 MG tablet Take 1 tablet (500 mg total) by mouth 2 (two) times daily. Patient taking differently: Take 500 mg by mouth See admin instructions. Take 1 tablet twice a day during outbreaks. 01/22/18  Yes Freeman Caldron M, PA-C  vitamin C (ASCORBIC ACID) 500 MG tablet Take 500 mg by mouth daily.   Yes [provider]  Blood Pressure Monitor DEVI Use as directed to check home blood pressure 2-3 times a week 12/15/18   Ladell Pier, MD  carvedilol (COREG) 3.125 MG tablet TAKE 1 TABLET (3.125 MG TOTAL) BY MOUTH 2 (TWO) TIMES DAILY WITH A MEAL. 12/18/18   Ladell Pier, MD     Positive ROS: All other systems have been reviewed and were otherwise negative with the exception of those mentioned in  the HPI and as above.  Physical Exam: General: Alert, no acute distress Cardiovascular: No pedal edema Respiratory: No cyanosis, no use of accessory musculature GI: abdomen soft Skin: No lesions in the area of chief complaint Neurologic: Sensation intact distally Psychiatric: Patient is competent for consent with normal mood and affect Lymphatic: no lymphedema  MUSCULOSKELETAL: exam stable  Assessment: left knee degenerative joint disease  Plan: Plan for Procedure(s): LEFT TOTAL KNEE ARTHROPLASTY  The risks benefits and alternatives were discussed with the patient including but not limited to the risks of nonoperative treatment, versus surgical intervention including infection, bleeding, nerve injury,  blood clots, cardiopulmonary complications, morbidity, mortality, among others, and they were willing to proceed.   Preoperative templating of the joint replacement has been completed, documented, and submitted to the Operating Room personnel in order to optimize intra-operative equipment management.  Anticipated LOS equal to or greater than 2 midnights due to - Age 74 and older with one or more of the following:  - Obesity  - Expected need for hospital services (PT, OT, Nursing) required for safe  discharge  - Anticipated need for postoperative skilled nursing care or inpatient rehab  - Active co-morbidities: Respiratory Failure/COPD  Eduard Roux, MD   12/22/2018 7:14 AM

## 2018-12-22 NOTE — Discharge Instructions (Signed)

## 2018-12-22 NOTE — Anesthesia Procedure Notes (Addendum)
Spinal  Patient location during procedure: OR Start time: 12/22/2018 10:15 AM End time: 12/22/2018 10:17 AM Staffing Anesthesiologist: Effie Berkshire, MD Performed: anesthesiologist  Preanesthetic Checklist Completed: patient identified, site marked, surgical consent, pre-op evaluation, timeout performed, IV checked, risks and benefits discussed and monitors and equipment checked Spinal Block Patient position: sitting Prep: site prepped and draped and DuraPrep Patient monitoring: heart rate, cardiac monitor, continuous pulse ox and blood pressure Approach: midline Location: L3-4 Injection technique: single-shot Needle Needle type: Pencan  Needle gauge: 24 G Needle length: 9 cm Needle insertion depth: 10 cm Additional Notes Patient tolerated well. No immediate complications.

## 2018-12-22 NOTE — Anesthesia Postprocedure Evaluation (Signed)
Anesthesia Post Note  Patient: Rachel Woodard  Procedure(s) Performed: LEFT TOTAL KNEE ARTHROPLASTY (Left Knee)     Patient location during evaluation: PACU Anesthesia Type: Spinal Level of consciousness: oriented and awake and alert Pain management: pain level controlled Vital Signs Assessment: post-procedure vital signs reviewed and stable Respiratory status: spontaneous breathing, respiratory function stable and patient connected to nasal cannula oxygen Cardiovascular status: blood pressure returned to baseline and stable Postop Assessment: no headache, no backache and no apparent nausea or vomiting Anesthetic complications: no    Last Vitals:  Vitals:   12/22/18 1548 12/22/18 1549  BP: (!) 99/47 113/64  Pulse: (!) 58 66  Resp: 16   Temp: 36.7 C   SpO2: (!) 87%              Effie Berkshire

## 2018-12-22 NOTE — Progress Notes (Signed)
CSW acknowledges SNF consult, pending PT/OT recs.   Kenzlie Disch, LCSW [phone]  

## 2018-12-22 NOTE — Anesthesia Procedure Notes (Signed)
Anesthesia Regional Block: Adductor canal block   Pre-Anesthetic Checklist: ,, timeout performed, Correct Patient, Correct Site, Correct Laterality, Correct Procedure, Correct Position, site marked, Risks and benefits discussed,  Surgical consent,  Pre-op evaluation,  At surgeon's request and post-op pain management  Laterality: Left  Prep: chloraprep       Needles:  Injection technique: Single-shot  Needle Type: Echogenic Stimulator Needle     Needle Length: 9cm  Needle Gauge: 21     Additional Needles:   Procedures:,,,, ultrasound used (permanent image in chart),,,,  Narrative:  Start time: 12/22/2018 9:15 AM End time: 12/22/2018 9:22 AM Injection made incrementally with aspirations every 5 mL.  Performed by: Personally  Anesthesiologist: Effie Berkshire, MD  Additional Notes: Patient tolerated the procedure well. Local anesthetic introduced in an incremental fashion under minimal resistance after negative aspirations. No paresthesias were elicited. After completion of the procedure, no acute issues were identified and patient continued to be monitored by RN.

## 2018-12-22 NOTE — Plan of Care (Signed)

## 2018-12-22 NOTE — Progress Notes (Signed)
Orthopedic Tech Progress Note Patient Details:  Rachel Woodard March 25, 1951 [id number]  CPM Left Knee CPM Left Knee: On     Melony Overly T 12/22/2018, 1:07 PM

## 2018-12-22 NOTE — Transfer of Care (Signed)
Immediate Anesthesia Transfer of Care Note  Patient: Rachel Woodard  Procedure(s) Performed: LEFT TOTAL KNEE ARTHROPLASTY (Left Knee)  Patient Location: PACU  Anesthesia Type:MAC combined with regional for post-op pain  Level of Consciousness: awake, alert , oriented and patient cooperative  Airway & Oxygen Therapy: Patient Spontanous Breathing and Patient connected to nasal cannula oxygen  Post-op Assessment: Report given to RN and Post -op Vital signs reviewed and stable  Post vital signs: Reviewed and stable  Last Vitals:  Vitals Value Taken Time  BP 113/58 12/22/18 1227  Temp    Pulse 64 12/22/18 1230  Resp 16 12/22/18 1230  SpO2 96 % 12/22/18 1230  Vitals shown include unvalidated device data.  Last Pain:  Vitals:   12/22/18 0927  TempSrc:   PainSc: 0-No pain         Complications: No apparent anesthesia complications

## 2018-12-23 ENCOUNTER — Encounter (HOSPITAL_COMMUNITY): Payer: Self-pay | Admitting: Orthopaedic Surgery

## 2018-12-23 LAB — CBC
HCT: 37.9 % (ref 36.0–46.0)
Hemoglobin: 12.1 g/dL (ref 12.0–15.0)
MCH: 27.9 pg (ref 26.0–34.0)
MCHC: 31.9 g/dL (ref 30.0–36.0)
MCV: 87.3 fL (ref 80.0–100.0)
Platelets: 187 10*3/uL (ref 150–400)
RBC: 4.34 MIL/uL (ref 3.87–5.11)
RDW: 14.2 % (ref 11.5–15.5)
WBC: 8.7 10*3/uL (ref 4.0–10.5)
nRBC: 0 % (ref 0.0–0.2)

## 2018-12-23 LAB — BASIC METABOLIC PANEL
Anion gap: 8 (ref 5–15)
BUN: 17 mg/dL (ref 8–23)
CO2: 25 mmol/L (ref 22–32)
Calcium: 8.2 mg/dL — ABNORMAL LOW (ref 8.9–10.3)
Chloride: 101 mmol/L (ref 98–111)
Creatinine, Ser: 1.36 mg/dL — ABNORMAL HIGH (ref 0.44–1.00)
GFR calc Af Amer: 47 mL/min — ABNORMAL LOW (ref >60)
GFR calc non Af Amer: 41 mL/min — ABNORMAL LOW (ref >60)
Glucose, Bld: 115 mg/dL — ABNORMAL HIGH (ref 70–99)
Potassium: 4.2 mmol/L (ref 3.5–5.1)
Sodium: 134 mmol/L — ABNORMAL LOW (ref 135–145)

## 2018-12-23 NOTE — Progress Notes (Addendum)
Physical Therapy Treatment Patient Details Name: Rachel Woodard MRN: [id number] DOB: 1950-11-01 Today's Date: 12/23/2018    History of Present Illness Pt is a 68 y/o female s/p L TKA. PMH includes breast cancer, COPD, fibromyalgia, HTN, pre-diabetes, and sleep apnea on CPAP.     PT Comments    Pt performed gait training and functional mobility during this am session with better carryover.  No buckling noted but remains to require min assistance.  Will follow up in pm to progress mobility to tolerance.   Follow Up Recommendations  Follow surgeon's recommendation for DC plan and follow-up therapies;Supervision for mobility/OOB     Equipment Recommendations  3in1 (PT)    Recommendations for Other Services OT consult     Precautions / Restrictions Precautions Precautions: Knee Precaution Booklet Issued: No Precaution Comments: Reviewed knee precautions with pt.  Restrictions Weight Bearing Restrictions: Yes LLE Weight Bearing: Weight bearing as tolerated    Mobility  Bed Mobility Overal bed mobility: Needs Assistance Bed Mobility: Supine to Sit;Sit to Supine     Supine to sit: Modified independent (Device/Increase time) Sit to supine: Supervision;Modified independent (Device/Increase time)   General bed mobility comments: Pt in recliner on arrival.  Transfers Overall transfer level: Needs assistance Equipment used: Rolling walker (2 wheeled) Transfers: Sit to/from Stand Sit to Stand: Min guard         General transfer comment: Cues for hand placement.  Ambulation/Gait Ambulation/Gait assistance: Min guard Gait Distance (Feet): 60 Feet Assistive device: Rolling walker (2 wheeled) Gait Pattern/deviations: Step-to pattern;Step-through pattern;Antalgic;Trunk flexed     General Gait Details: Pt required cues for upper trunk control and sequencing.  She slowly progressed to step through pattern without cueing.   Stairs             Wheelchair  Mobility    Modified Rankin (Stroke Patients Only)       Balance Overall balance assessment: Needs assistance Sitting-balance support: No upper extremity supported;Feet supported Sitting balance-Leahy Scale: Good     Standing balance support: Bilateral upper extremity supported;During functional activity Standing balance-Leahy Scale: Poor Standing balance comment: Reliant on BUE support                             Cognition Arousal/Alertness: Awake/alert Behavior During Therapy: WFL for tasks assessed/performed Overall Cognitive Status: Within Functional Limits for tasks assessed                                        Exercises Total Joint Exercises Ankle Circles/Pumps: AROM;Both;20 reps;Supine Quad Sets: AROM;Left;10 reps;Supine Heel Slides: AAROM;AROM;Left;10 reps;Supine Hip ABduction/ADduction: AROM;Left;10 reps;Supine;AAROM Straight Leg Raises: AAROM;Left;10 reps;Supine    General Comments        Pertinent Vitals/Pain Pain Assessment: Faces Faces Pain Scale: Hurts even more Pain Location: L knee  Pain Descriptors / Indicators: Aching;Operative site guarding Pain Intervention(s): Monitored during session;Repositioned    Home Living Family/patient expects to be discharged to:: Private residence Living Arrangements: Non-relatives/Friends Available Help at Discharge: Friend(s) Type of Home: House Home Access: Stairs to enter Entrance Stairs-Rails: Right;Left;Can reach both Home Layout: One level Home Equipment: Environmental consultant - 2 wheels;Cane - single point Additional Comments: Lives in a recovery house     Prior Function Level of Independence: Independent with assistive device(s)      Comments: Has been using cane for ambulation    PT Goals (  current goals can now be found in the care plan section) Acute Rehab PT Goals Patient Stated Goal: to go to SNF and then go back home PT Goal Formulation: With patient/family Potential to Achieve  Goals: Good Progress towards PT goals: Progressing toward goals    Frequency    7X/week      PT Plan Current plan remains appropriate    Co-evaluation              AM-PAC PT "6 Clicks" Mobility   Outcome Measure  Help needed turning from your back to your side while in a flat bed without using bedrails?: None Help needed moving from lying on your back to sitting on the side of a flat bed without using bedrails?: A Little Help needed moving to and from a bed to a chair (including a wheelchair)?: A Little Help needed standing up from a chair using your arms (e.g., wheelchair or bedside chair)?: A Little Help needed to walk in hospital room?: A Little Help needed climbing 3-5 steps with a railing? : A Little 6 Click Score: 19    End of Session Equipment Utilized During Treatment: Gait belt Activity Tolerance: Patient tolerated treatment well Patient left: in bed;with call bell/phone within reach Nurse Communication: Mobility status PT Visit Diagnosis: Unsteadiness on feet (R26.81);Muscle weakness (generalized) (M62.81);Difficulty in walking, not elsewhere classified (R26.2)     Time: 1829-9371 PT Time Calculation (min) (ACUTE ONLY): 17 min  Charges:  $Gait Training: 8-22 mins                     Governor Rooks, PTA Acute Rehabilitation Services Pager [phone] Office [phone]     Cristela Blue 12/23/2018, 12:33 PM

## 2018-12-23 NOTE — Progress Notes (Signed)
Subjective: 1 Day Post-Op Procedure(s) (LRB): LEFT TOTAL KNEE ARTHROPLASTY (Left) Patient reports pain as mild.  Feeling great this am.   Objective: Vital signs in last 24 hours: Temp:  [97 F (36.1 C)-98.3 F (36.8 C)] 98.3 F (36.8 C) (06/30 0505) Pulse Rate:  [52-66] 54 (06/30 0505) Resp:  [12-18] 18 (06/30 0505) BP: (99-129)/(47-68) 109/68 (06/30 0505) SpO2:  [87 %-100 %] 96 % (06/30 0505) Weight:  [106.8 kg] 106.8 kg (06/29 0820)  Intake/Output from previous day: 06/29 0701 - 06/30 0700 In: 2744.7 [P.O.:720; I.V.:1774.6; IV Piggyback:200.1] Out: 1150 [Urine:1100; Blood:50] Intake/Output this shift: No intake/output data recorded.  Recent Labs    12/23/18 0521  HGB 12.1   Recent Labs    12/23/18 0521  WBC 8.7  RBC 4.34  HCT 37.9  PLT 187   Recent Labs    12/23/18 0521  NA 134*  K 4.2  CL 101  CO2 25  BUN 17  CREATININE 1.36*  GLUCOSE 115*  CALCIUM 8.2*   No results for input(s): LABPT, INR in the last 72 hours.  Neurologically intact Neurovascular intact Sensation intact distally Intact pulses distally Dorsiflexion/Plantar flexion intact Incision: dressing C/D/I No cellulitis present Compartment soft   Assessment/Plan: 1 Day Post-Op Procedure(s) (LRB): LEFT TOTAL KNEE ARTHROPLASTY (Left) Advance diet Up with therapy D/C IV fluids Discharge to SNF once insurance approved and bed available (likely tomorrow) WBAT LLE D/c foley Apply ted hose    Anticipated LOS equal to or greater than 2 midnights due to - Age 66 and older with one or more of the following:  - Obesity  - Expected need for hospital services (PT, OT, Nursing) required for safe  discharge  - Anticipated need for postoperative skilled nursing care or inpatient rehab  - Active co-morbidities: None OR   - Unanticipated findings during/Post Surgery: Slow post-op progression: GI, pain control, mobility  - Patient is a high risk of re-admission due to: Barriers to post-acute  care (logistical, no family support in home)    Rachel Woodard 12/23/2018, 8:01 AM

## 2018-12-23 NOTE — Progress Notes (Signed)
Physical Therapy Treatment Patient Details Name: Rachel Woodard MRN: [id number] DOB: [birth year] Today's Date: 12/23/2018    History of Present Illness Pt is a 68 y/o female s/p L TKA. PMH includes breast cancer, COPD, fibromyalgia, HTN, pre-diabetes, and sleep apnea on CPAP.     PT Comments    Pt up in bathroom on arrival. She is progressing well.  She is requring supervision to min guard for all mobility.  Pt lives alone in group home and lacks adequate support to return home.  She continues to require SNF rehab to improve strength and function to return to her home with functional independence.      Follow Up Recommendations  Follow surgeon's recommendation for DC plan and follow-up therapies;Supervision for mobility/OOB     Equipment Recommendations  3in1 (PT)    Recommendations for Other Services OT consult     Precautions / Restrictions Precautions Precautions: Knee Precaution Booklet Issued: No Precaution Comments: Reviewed knee precautions with pt.  Restrictions Weight Bearing Restrictions: Yes LLE Weight Bearing: Weight bearing as tolerated    Mobility  Bed Mobility Overal bed mobility: Needs Assistance Bed Mobility: Sit to Supine     Supine to sit: Modified independent (Device/Increase time)     General bed mobility comments: No assistance needed to return to supine  Transfers Overall transfer level: Needs assistance Equipment used: Rolling walker (2 wheeled) Transfers: Sit to/from Stand Sit to Stand: Supervision         General transfer comment: for safety  Ambulation/Gait Ambulation/Gait assistance: Min guard Gait Distance (Feet): 150 Feet Assistive device: Rolling walker (2 wheeled) Gait Pattern/deviations: Step-through pattern;Trunk flexed     General Gait Details: Cues for upper trunk control and knee extenison in L stance phase.   Stairs             Wheelchair Mobility    Modified Rankin (Stroke Patients Only)        Balance Overall balance assessment: Needs assistance Sitting-balance support: No upper extremity supported;Feet supported Sitting balance-Leahy Scale: Good     Standing balance support: Bilateral upper extremity supported;During functional activity Standing balance-Leahy Scale: Fair Standing balance comment: Reliant on BUE support for gt                            Cognition Arousal/Alertness: Awake/alert Behavior During Therapy: WFL for tasks assessed/performed Overall Cognitive Status: Within Functional Limits for tasks assessed                                        Exercises Total Joint Exercises Ankle Circles/Pumps: AROM;Both;20 reps;Supine Quad Sets: AROM;Left;10 reps;Supine Heel Slides: Left;10 reps;Supine;AROM Hip ABduction/ADduction: Left;10 reps;Supine;AROM Straight Leg Raises: Left;10 reps;Supine;AROM Long Arc Quad: AROM;Left;10 reps;Seated    General Comments        Pertinent Vitals/Pain Pain Assessment: Faces Faces Pain Scale: Hurts even more Pain Location: L knee  Pain Descriptors / Indicators: Aching;Operative site guarding Pain Intervention(s): Monitored during session;Repositioned;Ice applied    Home Living                      Prior Function            PT Goals (current goals can now be found in the care plan section) Acute Rehab PT Goals Patient Stated Goal: to go to SNF and then go back home PT Goal Formulation:  With patient Potential to Achieve Goals: Good Progress towards PT goals: Progressing toward goals    Frequency    7X/week      PT Plan Current plan remains appropriate    Co-evaluation              AM-PAC PT "6 Clicks" Mobility   Outcome Measure  Help needed turning from your back to your side while in a flat bed without using bedrails?: None Help needed moving from lying on your back to sitting on the side of a flat bed without using bedrails?: A Little Help needed moving to and  from a bed to a chair (including a wheelchair)?: A Little Help needed standing up from a chair using your arms (e.g., wheelchair or bedside chair)?: A Little Help needed to walk in hospital room?: A Little Help needed climbing 3-5 steps with a railing? : A Little 6 Click Score: 19    End of Session Equipment Utilized During Treatment: Gait belt Activity Tolerance: Patient tolerated treatment well Patient left: in bed;with call bell/phone within reach Nurse Communication: Mobility status PT Visit Diagnosis: Unsteadiness on feet (R26.81);Muscle weakness (generalized) (M62.81);Difficulty in walking, not elsewhere classified (R26.2)     Time: 6067-7034 PT Time Calculation (min) (ACUTE ONLY): 17 min  Charges:  $Gait Training: 8-22 mins                     Governor Rooks, PTA Acute Rehabilitation Services Pager [phone] Office [phone]     Kelleen Stolze Eli Hose 12/23/2018, 3:49 PM

## 2018-12-23 NOTE — Plan of Care (Signed)
  Problem: Education: Goal: Knowledge of General Education information will improve Description Including pain rating scale, medication(s)/side effects and non-pharmacologic comfort measures Outcome: Progressing   

## 2018-12-23 NOTE — Progress Notes (Addendum)
Occupational Therapy Evaluation Patient Details Name: Rachel Woodard MRN: [id number] DOB: 08-10-1950 Today's Date: 12/23/2018    History of Present Illness Pt is a 68 y/o female s/p L TKA. PMH includes breast cancer, COPD, fibromyalgia, HTN, pre-diabetes, and sleep apnea on CPAP.    Clinical Impression   Pt presented with above diagnosis. PTA pt PLOF independent in all ADLs, however, requiring a cane for functional mobility. Pt reports living in a recovery home with no known AE available. Pt currently requires min guard for LB dressing for safety while seated at EOB and Min guard to Min A for functional transfers due to instability. Pt will benefit from continued acute OT to address deficits and to maximize independence in ADLs prior to d/c in SNF level. OT will continue to follow acutely.     Follow Up Recommendations  SNF;Supervision - Intermittent    Equipment Recommendations  3 in 1 bedside commode    Recommendations for Other Services       Precautions / Restrictions Precautions Precautions: Knee Precaution Booklet Issued: No Precaution Comments: Reviewed knee precautions with pt.  Restrictions Weight Bearing Restrictions: Yes LLE Weight Bearing: Weight bearing as tolerated      Mobility Bed Mobility Overal bed mobility: Needs Assistance Bed Mobility: Supine to Sit;Sit to Supine     Supine to sit: Modified independent (Device/Increase time) Sit to supine: Supervision;Modified independent (Device/Increase time)   General bed mobility comments:  Increased time required.   Transfers Overall transfer level: Needs assistance Equipment used: Rolling walker (2 wheeled) Transfers: Sit to/from Stand Sit to Stand: From elevated surface;Min guard         General transfer comment: min gaurd to power up to stand with RW for safety. No significant knee buckling however instability observed during ambulation for functional transfer    Balance Overall balance  assessment: Needs assistance Sitting-balance support: No upper extremity supported;Feet supported Sitting balance-Leahy Scale: Good     Standing balance support: Bilateral upper extremity supported;During functional activity Standing balance-Leahy Scale: Poor Standing balance comment: Reliant on BUE support                            ADL either performed or assessed with clinical judgement   ADL Overall ADL's : Needs assistance/impaired Eating/Feeding: Set up;Sitting   Grooming: Set up;Sitting   Upper Body Bathing: Independent;Sitting   Lower Body Bathing: Minimal assistance;Sitting/lateral leans   Upper Body Dressing : Independent;Sitting   Lower Body Dressing: Minimal assistance;Sitting/lateral leans   Toilet Transfer: Min guard;Ambulation;RW Toilet Transfer Details (indicate cue type and reason): simualted toilet transfer from bed ro recliner. Pt demonstrated some instability but able to catch balance. Therapist for safety.         Functional mobility during ADLs: Min guard General ADL Comments: VCs for sequencing.      Vision         Perception     Praxis      Pertinent Vitals/Pain Pain Assessment: Faces Faces Pain Scale: Hurts a little bit Pain Location: L knee  Pain Descriptors / Indicators: Aching;Operative site guarding Pain Intervention(s): Monitored during session;Repositioned     Hand Dominance Right   Extremity/Trunk Assessment Upper Extremity Assessment Upper Extremity Assessment: Overall WFL for tasks assessed   Lower Extremity Assessment Lower Extremity Assessment: Defer to PT evaluation LLE Deficits / Details: Deficits consistent with post op pain and weakness. Pt reporting some numbness, and pt with notable knee buckling when attempting transfers.  LLE Sensation: decreased light touch LLE Coordination: decreased gross motor   Cervical / Trunk Assessment Cervical / Trunk Assessment: Normal   Communication  Communication Communication: No difficulties   Cognition Arousal/Alertness: Suspect due to medications Behavior During Therapy: WFL for tasks assessed/performed Overall Cognitive Status: Within Functional Limits for tasks assessed                                     General Comments       Exercises Exercises: Total Joint Total Joint Exercises Ankle Circles/Pumps: AROM;Both;20 reps;Supine Quad Sets: AROM;Left;10 reps;Supine   Shoulder Instructions      Home Living Family/patient expects to be discharged to:: Private residence Living Arrangements: Non-relatives/Friends Available Help at Discharge: Friend(s) Type of Home: House Home Access: Stairs to enter [employer] of Steps: 5 Entrance Stairs-Rails: Right;Left;Can reach both Home Layout: One level     Bathroom Shower/Tub: Teacher, early years/pre: Standard     Home Equipment: Environmental consultant - 2 wheels;Cane - single point   Additional Comments: Lives in a recovery house       Prior Functioning/Environment Level of Independence: Independent with assistive device(s)        Comments: Has been using cane for ambulation         OT Problem List:        OT Treatment/Interventions:      OT Goals(Current goals can be found in the care plan section) Acute Rehab OT Goals Patient Stated Goal: to go to SNF and then go back home OT Goal Formulation: With patient Time For Goal Achievement: 01/06/19 Potential to Achieve Goals: Good ADL Goals Pt Will Perform Lower Body Bathing: with modified independence;sitting/lateral leans Pt Will Perform Lower Body Dressing: with modified independence;sitting/lateral leans;with adaptive equipment Pt Will Transfer to Toilet: with modified independence;bedside commode;ambulating Pt Will Perform Toileting - Clothing Manipulation and hygiene: with modified independence;sit to/from stand Pt Will Perform Tub/Shower Transfer: Tub transfer;3 in 1;rolling  walker;ambulating  OT Frequency:     Barriers to D/C:            Co-evaluation              AM-PAC OT "6 Clicks" Daily Activity     Outcome Measure Help from another person eating meals?: None Help from another person taking care of personal grooming?: None Help from another person toileting, which includes using toliet, bedpan, or urinal?: A Little Help from another person bathing (including washing, rinsing, drying)?: A Little Help from another person to put on and taking off regular upper body clothing?: None Help from another person to put on and taking off regular lower body clothing?: A Little 6 Click Score: 21   End of Session Equipment Utilized During Treatment: Gait belt;Rolling walker CPM Left Knee CPM Left Knee: Off Nurse Communication: Mobility status;Weight bearing status  Activity Tolerance: Patient tolerated treatment well Patient left: in chair;with call bell/phone within reach;with chair alarm set                   Time: [phone] OT Time Calculation (min): 16 min Charges:  OT General Charges $OT Visit: 1 Visit OT Evaluation $OT Eval Low Complexity: [place], MSOT, OTR/L  Supplemental Rehabilitation Services  [phone]   Marius Ditch 12/23/2018, 11:50 AM

## 2018-12-24 DIAGNOSIS — K219 Gastro-esophageal reflux disease without esophagitis: Secondary | ICD-10-CM | POA: Diagnosis present

## 2018-12-24 DIAGNOSIS — Z888 Allergy status to other drugs, medicaments and biological substances status: Secondary | ICD-10-CM | POA: Diagnosis not present

## 2018-12-24 DIAGNOSIS — D638 Anemia in other chronic diseases classified elsewhere: Secondary | ICD-10-CM | POA: Diagnosis present

## 2018-12-24 DIAGNOSIS — F17210 Nicotine dependence, cigarettes, uncomplicated: Secondary | ICD-10-CM | POA: Diagnosis present

## 2018-12-24 DIAGNOSIS — M797 Fibromyalgia: Secondary | ICD-10-CM | POA: Diagnosis present

## 2018-12-24 DIAGNOSIS — Z1159 Encounter for screening for other viral diseases: Secondary | ICD-10-CM | POA: Diagnosis not present

## 2018-12-24 DIAGNOSIS — Z90710 Acquired absence of both cervix and uterus: Secondary | ICD-10-CM | POA: Diagnosis not present

## 2018-12-24 DIAGNOSIS — G4730 Sleep apnea, unspecified: Secondary | ICD-10-CM | POA: Diagnosis present

## 2018-12-24 DIAGNOSIS — M1712 Unilateral primary osteoarthritis, left knee: Secondary | ICD-10-CM | POA: Diagnosis present

## 2018-12-24 DIAGNOSIS — E669 Obesity, unspecified: Secondary | ICD-10-CM | POA: Diagnosis present

## 2018-12-24 DIAGNOSIS — I10 Essential (primary) hypertension: Secondary | ICD-10-CM | POA: Diagnosis present

## 2018-12-24 DIAGNOSIS — Z79899 Other long term (current) drug therapy: Secondary | ICD-10-CM | POA: Diagnosis not present

## 2018-12-24 DIAGNOSIS — J449 Chronic obstructive pulmonary disease, unspecified: Secondary | ICD-10-CM | POA: Diagnosis present

## 2018-12-24 DIAGNOSIS — Z923 Personal history of irradiation: Secondary | ICD-10-CM | POA: Diagnosis not present

## 2018-12-24 DIAGNOSIS — Z9884 Bariatric surgery status: Secondary | ICD-10-CM | POA: Diagnosis not present

## 2018-12-24 DIAGNOSIS — Z853 Personal history of malignant neoplasm of breast: Secondary | ICD-10-CM | POA: Diagnosis not present

## 2018-12-24 DIAGNOSIS — Z6838 Body mass index (BMI) 38.0-38.9, adult: Secondary | ICD-10-CM | POA: Diagnosis not present

## 2018-12-24 NOTE — Progress Notes (Signed)
Physical Therapy Treatment Patient Details Name: Rachel Woodard MRN: [id number] DOB: 1951/03/05 Today's Date: 12/24/2018    History of Present Illness Pt is a 68 y/o female s/p L TKA. PMH includes breast cancer, COPD, fibromyalgia, HTN, pre-diabetes, and sleep apnea on CPAP.     PT Comments    Patient slowly progressing.  Limited by pain and some increased swelling per pt.  Applied THT to L leg in supine.  Issued HEP and reviewed.  Continues to be appropriate for SNF level rehab at d/c.    Follow Up Recommendations  Follow surgeon's recommendation for DC plan and follow-up therapies;Supervision for mobility/OOB     Equipment Recommendations  3in1 (PT)    Recommendations for Other Services       Precautions / Restrictions Precautions Precautions: Fall;Knee Restrictions Weight Bearing Restrictions: Yes LLE Weight Bearing: Weight bearing as tolerated    Mobility  Bed Mobility   Bed Mobility: Sit to Supine     Supine to sit: Modified independent (Device/Increase time) Sit to supine: Modified independent (Device/Increase time)   General bed mobility comments: No assistance needed to return to supine  Transfers Overall transfer level: Needs assistance Equipment used: Rolling walker (2 wheeled) Transfers: Sit to/from Stand Sit to Stand: Supervision         General transfer comment: for balance, safety, cues for hand placement  Ambulation/Gait Ambulation/Gait assistance: Min guard Gait Distance (Feet): 100 Feet Assistive device: Rolling walker (2 wheeled) Gait Pattern/deviations: Step-to pattern;Decreased step length - left;Antalgic     General Gait Details: knee flexed in stance   Stairs             Wheelchair Mobility    Modified Rankin (Stroke Patients Only)       Balance Overall balance assessment: Needs assistance Sitting-balance support: No upper extremity supported;Feet supported Sitting balance-Leahy Scale: Good     Standing  balance support: Bilateral upper extremity supported;During functional activity Standing balance-Leahy Scale: Fair                              Cognition Arousal/Alertness: Awake/alert Behavior During Therapy: WFL for tasks assessed/performed Overall Cognitive Status: Within Functional Limits for tasks assessed                                        Exercises Total Joint Exercises Ankle Circles/Pumps: AROM;10 reps;Both;Supine Quad Sets: AROM;10 reps;Left;Supine Short Arc Quad: AROM;10 reps;Supine;Left Heel Slides: Left;10 reps;AROM;AAROM;Supine Hip ABduction/ADduction: AROM;Left;10 reps;Supine Straight Leg Raises: AROM;Left;10 reps;Supine Long Arc Quad: AROM;Left;10 reps;Seated Knee Flexion: AROM;Left;10 reps;Seated    General Comments General comments (skin integrity, edema, etc.): issued handout for HEP and reviewed with pt      Pertinent Vitals/Pain Pain Score: 5  Pain Location: L knee  Pain Descriptors / Indicators: Aching;Operative site guarding;Tightness(swollen) Pain Intervention(s): Monitored during session;Repositioned;Premedicated before session;Ice applied    Home Living                      Prior Function            PT Goals (current goals can now be found in the care plan section) Progress towards PT goals: Progressing toward goals    Frequency    7X/week      PT Plan Current plan remains appropriate    Co-evaluation  AM-PAC PT "6 Clicks" Mobility   Outcome Measure  Help needed turning from your back to your side while in a flat bed without using bedrails?: None Help needed moving from lying on your back to sitting on the side of a flat bed without using bedrails?: A Little Help needed moving to and from a bed to a chair (including a wheelchair)?: A Little Help needed standing up from a chair using your arms (e.g., wheelchair or bedside chair)?: A Little Help needed to walk in hospital  room?: A Little Help needed climbing 3-5 steps with a railing? : A Little 6 Click Score: 19    End of Session   Activity Tolerance: Patient tolerated treatment well Patient left: in chair;with call bell/phone within reach   PT Visit Diagnosis: Unsteadiness on feet (R26.81);Muscle weakness (generalized) (M62.81);Difficulty in walking, not elsewhere classified (R26.2)     Time: 4627-0350 PT Time Calculation (min) (ACUTE ONLY): 28 min  Charges:  $Gait Training: 8-22 mins $Therapeutic Exercise: 8-22 mins                     Magda Kiel, [place] [facility] [phone] 12/24/2018    Reginia Naas 12/24/2018, 12:01 PM

## 2018-12-24 NOTE — TOC Initial Note (Signed)
Transition of Care [facility]) - Initial/Assessment Note    Patient Details  Name: Rachel Woodard MRN: [id number] Date of Birth: 10/30/1950  Transition of Care [facility]) CM/SW Contact:    Alberteen Sam, [place] Phone Number: [phone] 12/24/2018, 10:59 AM  Clinical Narrative:                  CSW spoke with patient who reports she would like to be assessed for [facility] inpatient rehab. CSW explained that PT had recommended SNF, patient reports she would like to see if she qualifies for CIR before looking at SNF options.   CSW requested orders for IP rehab consult, pending findings.  Expected Discharge Plan: Skilled Nursing Facility Barriers to Discharge: Continued Medical Work up   Patient Goals and CMS Choice Patient states their goals for this hospitalization and ongoing recovery are:: to go to inpatient rehab CMS Medicare.gov Compare Post Acute Care list provided to:: Patient Choice offered to / list presented to : Patient  Expected Discharge Plan and Services Expected Discharge Plan: [place] Acute Care Choice: [place] Living arrangements for the past 2 months: Single Family Home Expected Discharge Date: 12/24/18                                    Prior Living Arrangements/Services Living arrangements for the past 2 months: Single Family Home Lives with:: Self Patient language and need for interpreter reviewed:: Yes Do you feel safe going back to the place where you live?: No   needs short term rehab  Need for Family Participation in Patient Care: No (Comment) Care giver support system in place?: No (comment)   Criminal Activity/Legal Involvement Pertinent to Current Situation/Hospitalization: No - Comment as needed  Activities of Daily Living Home Assistive Devices/Equipment: Cane (specify quad or straight), Eyeglasses, Dentures (specify type) ADL Screening (condition at time of admission) Patient's cognitive ability  adequate to safely complete daily activities?: Yes Is the patient deaf or have difficulty hearing?: No Does the patient have difficulty seeing, even when wearing glasses/contacts?: No Does the patient have difficulty concentrating, remembering, or making decisions?: No Patient able to express need for assistance with ADLs?: Yes Does the patient have difficulty dressing or bathing?: Yes Independently performs ADLs?: Yes (appropriate for developmental age) Does the patient have difficulty walking or climbing stairs?: Yes Weakness of Legs: Both Weakness of Arms/Hands: None  Permission Sought/Granted Permission sought to share information with : Case Manager, Customer service manager, Family Supports Permission granted to share information with : Yes, Verbal Permission Granted     Permission granted to share info w AGENCY: SNFs        Emotional Assessment Appearance:: Appears stated age Attitude/Demeanor/Rapport: Gracious Affect (typically observed): Calm Orientation: : Oriented to Self, Oriented to Place, Oriented to  Time, Oriented to Situation Alcohol / Substance Use: Not Applicable Psych Involvement: No (comment)  Admission diagnosis:  left knee degenerative joint disease Patient Active Problem List   Diagnosis Date Noted  . Status post total left knee replacement 12/22/2018  . MDD (major depressive disorder), recurrent episode, mild ([place]) 10/29/2018  . Autoimmune hepatitis ([place]) 10/29/2018  . OSA (obstructive sleep apnea) 10/29/2018  . Renal cyst, right 10/22/2018  . Lung nodule, solitary 10/22/2018  . Genital herpes simplex 10/29/2017  . Vitamin D deficiency 10/29/2017  . Class 2 obesity due to excess calories without serious comorbidity with body  mass index (BMI) of 38.0 to 38.9 in adult 10/29/2017  . Essential hypertension 10/29/2017  . Pre-diabetes 02/14/2017  . Fibromyalgia 02/14/2017  . Substance abuse in remission ([place]) 02/14/2017  . Anxiety and depression  02/14/2017  . HX: breast cancer 02/14/2017  . Urge incontinence 11/13/2016  . Unilateral primary osteoarthritis, left knee 06/14/2016  . PMB (postmenopausal bleeding) 04/02/2016  . GERD (gastroesophageal reflux disease) 11/02/2015  . Angioedema 10/24/2013  . Tobacco abuse 10/24/2013  . DCIS (ductal carcinoma in situ) of breast 04/07/2013  . Depression 04/07/2013   PCP:  Ladell Pier, MD Pharmacy:   [place], [place] [address] Phone: [phone] Fax: [place], [place] - [address] [place] [address] Phone: [phone] Fax: [phone]     Social Determinants of Health (SDOH) Interventions    Readmission Risk Interventions No flowsheet data found.

## 2018-12-24 NOTE — Progress Notes (Signed)
Subjective: 2 Days Post-Op Procedure(s) (LRB): LEFT TOTAL KNEE ARTHROPLASTY (Left) Patient reports pain as moderate.  Increased pain today.  Thinks it is from overdoing it yesterday.  Otherwise, no complaints.   Objective: Vital signs in last 24 hours: Temp:  [97.6 F (36.4 C)-98.4 F (36.9 C)] 98.4 F (36.9 C) (07/01 0438) Pulse Rate:  [57-78] 78 (07/01 0438) Resp:  [18-20] 20 (07/01 0438) BP: (118-129)/(58-76) 118/58 (07/01 0438) SpO2:  [92 %-98 %] 92 % (07/01 0438)  Intake/Output from previous day: 06/30 0701 - 07/01 0700 In: 720 [P.O.:720] Out: -  Intake/Output this shift: No intake/output data recorded.  Recent Labs    12/23/18 0521  HGB 12.1   Recent Labs    12/23/18 0521  WBC 8.7  RBC 4.34  HCT 37.9  PLT 187   Recent Labs    12/23/18 0521  NA 134*  K 4.2  CL 101  CO2 25  BUN 17  CREATININE 1.36*  GLUCOSE 115*  CALCIUM 8.2*   No results for input(s): LABPT, INR in the last 72 hours.  Neurologically intact Neurovascular intact Sensation intact distally Intact pulses distally Dorsiflexion/Plantar flexion intact Incision: dressing C/D/I No cellulitis present Compartment soft   Assessment/Plan: 2 Days Post-Op Procedure(s) (LRB): LEFT TOTAL KNEE ARTHROPLASTY (Left) Advance diet Up with therapy D/C IV fluids Discharge to SNF once insurance approved WBAT LLE    Anticipated LOS equal to or greater than 2 midnights due to - Age 66 and older with one or more of the following:  - Obesity  - Expected need for hospital services (PT, OT, Nursing) required for safe  discharge  - Anticipated need for postoperative skilled nursing care or inpatient rehab  - Active co-morbidities: None OR   - Unanticipated findings during/Post Surgery: Slow post-op progression: GI, pain control, mobility  - Patient is a high risk of re-admission due to: Barriers to post-acute care (logistical, no family support in home)    Aundra Dubin 12/24/2018, 7:47 AM

## 2018-12-24 NOTE — NC FL2 (Signed)
[place] MEDICAID FL2 LEVEL OF CARE SCREENING TOOL     IDENTIFICATION  Patient Name: Rachel Woodard Birthdate: 10-18-1950 Sex: female Admission Date (Current Location): 12/22/2018  [facility] and [place] Number:  Herbalist and Address:  [facility] [place] [address], [place], [place] [address]      Provider Number: [id number]  Attending Physician Name and Address:  Leandrew Koyanagi, MD  Relative Name and Phone Number:  N/A    Current Level of Care: Hospital Recommended Level of Care: [place] Prior Approval Number:    Date Approved/Denied:   PASRR Number: [id number]  Discharge Plan: SNF    Current Diagnoses: Patient Active Problem List   Diagnosis Date Noted  . Status post total left knee replacement 12/22/2018  . MDD (major depressive disorder), recurrent episode, mild ([place]) 10/29/2018  . Autoimmune hepatitis ([place]) 10/29/2018  . OSA (obstructive sleep apnea) 10/29/2018  . Renal cyst, right 10/22/2018  . Lung nodule, solitary 10/22/2018  . Genital herpes simplex 10/29/2017  . Vitamin D deficiency 10/29/2017  . Class 2 obesity due to excess calories without serious comorbidity with body mass index (BMI) of 38.0 to 38.9 in adult 10/29/2017  . Essential hypertension 10/29/2017  . Pre-diabetes 02/14/2017  . Fibromyalgia 02/14/2017  . Substance abuse in remission ([place]) 02/14/2017  . Anxiety and depression 02/14/2017  . HX: breast cancer 02/14/2017  . Urge incontinence 11/13/2016  . Unilateral primary osteoarthritis, left knee 06/14/2016  . PMB (postmenopausal bleeding) 04/02/2016  . GERD (gastroesophageal reflux disease) 11/02/2015  . Angioedema 10/24/2013  . Tobacco abuse 10/24/2013  . DCIS (ductal carcinoma in situ) of breast 04/07/2013  . Depression 04/07/2013    Orientation RESPIRATION BLADDER Height & Weight     Self, Time, Situation, Place  Normal Continent Weight: 235 lb 8 oz (106.8 kg) Height:  5\' 6"   (167.6 cm)  BEHAVIORAL SYMPTOMS/MOOD NEUROLOGICAL BOWEL NUTRITION STATUS      Continent Diet(see discharge summary)  AMBULATORY STATUS COMMUNICATION OF NEEDS Skin   Limited Assist Verbally Other (Comment)(left knee closed surgical incision)                       Personal Care Assistance Level of Assistance  Bathing, Feeding, Dressing, Total care Bathing Assistance: Limited assistance Feeding assistance: Independent Dressing Assistance: Limited assistance Total Care Assistance: Limited assistance   Functional Limitations Info  Sight, Hearing, Speech Sight Info: Adequate Hearing Info: Adequate Speech Info: Adequate    SPECIAL CARE FACTORS FREQUENCY  PT (By licensed PT), OT (By licensed OT)     PT Frequency: min 5x weekly OT Frequency: min 5x weekly            Contractures Contractures Info: Not present    Additional Factors Info  Code Status, Allergies Code Status Info: Full Allergies Info: Lisinopril, Metformin and related           Current Medications (12/24/2018):  This is the current hospital active medication list Current Facility-Administered Medications  Medication Dose Route Frequency Provider Last Rate Last Dose  . 0.9 %  sodium chloride infusion   Intravenous Continuous Leandrew Koyanagi, MD 125 mL/hr at 12/23/18 8469    . acetaminophen (TYLENOL) tablet 325-650 mg  325-650 mg Oral Q6H PRN Leandrew Koyanagi, MD      . alum & mag hydroxide-simeth (MAALOX/MYLANTA) 200-200-20 MG/5ML suspension 30 mL  30 mL Oral Q4H PRN Leandrew Koyanagi, MD      . amLODipine (  NORVASC) tablet 5 mg  5 mg Oral Daily Leandrew Koyanagi, MD   5 mg at 12/24/18 9381  . aspirin chewable tablet 81 mg  81 mg Oral BID Leandrew Koyanagi, MD   81 mg at 12/24/18 0925  . bupivacaine liposome (EXPAREL) 1.3 % injection 266 mg  20 mL Infiltration Once Leandrew Koyanagi, MD      . carvedilol (COREG) tablet 3.125 mg  3.125 mg Oral BID WC Leandrew Koyanagi, MD   3.125 mg at 12/24/18 0175  . celecoxib (CELEBREX) capsule 200  mg  200 mg Oral BID Skeet Simmer, RPH   200 mg at 12/24/18 1025  . diphenhydrAMINE (BENADRYL) 12.5 MG/5ML elixir 25 mg  25 mg Oral Q4H PRN Leandrew Koyanagi, MD      . docusate sodium (COLACE) capsule 100 mg  100 mg Oral BID Leandrew Koyanagi, MD   100 mg at 12/24/18 8527  . gabapentin (NEURONTIN) capsule 300 mg  300 mg Oral TID Leandrew Koyanagi, MD   300 mg at 12/24/18 7824  . HYDROmorphone (DILAUDID) injection 0.5-1 mg  0.5-1 mg Intravenous Q4H PRN Leandrew Koyanagi, MD   1 mg at 12/22/18 1540  . lamoTRIgine (LAMICTAL) tablet 100 mg  100 mg Oral Daily Leandrew Koyanagi, MD   100 mg at 12/24/18 0925  . loratadine (CLARITIN) tablet 10 mg  10 mg Oral Daily Leandrew Koyanagi, MD   10 mg at 12/24/18 0925  . magnesium citrate solution 1 Bottle  1 Bottle Oral Once PRN Leandrew Koyanagi, MD      . menthol-cetylpyridinium (CEPACOL) lozenge 3 mg  1 lozenge Oral PRN Leandrew Koyanagi, MD       Or  . phenol (CHLORASEPTIC) mouth spray 1 spray  1 spray Mouth/Throat PRN Leandrew Koyanagi, MD      . methocarbamol (ROBAXIN) tablet 500 mg  500 mg Oral Q6H PRN Leandrew Koyanagi, MD   500 mg at 12/24/18 2353   Or  . methocarbamol (ROBAXIN) 500 mg in dextrose 5 % 50 mL IVPB  500 mg Intravenous Q6H PRN Leandrew Koyanagi, MD      . metoCLOPramide (REGLAN) tablet 5-10 mg  5-10 mg Oral Q8H PRN Leandrew Koyanagi, MD       Or  . metoCLOPramide (REGLAN) injection 5-10 mg  5-10 mg Intravenous Q8H PRN Leandrew Koyanagi, MD      . ondansetron [facility]) tablet 4 mg  4 mg Oral Q6H PRN Leandrew Koyanagi, MD       Or  . ondansetron [facility]) injection 4 mg  4 mg Intravenous Q6H PRN Leandrew Koyanagi, MD      . oxyCODONE (Oxy IR/ROXICODONE) immediate release tablet 10-15 mg  10-15 mg Oral Q4H PRN Leandrew Koyanagi, MD   15 mg at 12/23/18 1638  . oxyCODONE (Oxy IR/ROXICODONE) immediate release tablet 5-10 mg  5-10 mg Oral Q4H PRN Leandrew Koyanagi, MD   10 mg at 12/24/18 6144  . oxyCODONE (OXYCONTIN) 12 hr tablet 10 mg  10 mg Oral Q12H Leandrew Koyanagi, MD   10 mg at 12/24/18 0925  .  pantoprazole (PROTONIX) EC tablet 40 mg  40 mg Oral Daily Leandrew Koyanagi, MD   40 mg at 12/24/18 3154  . pneumococcal 23 valent vaccine (PNU-IMMUNE) injection 0.5 mL  0.5 mL Intramuscular Prior to discharge Leandrew Koyanagi, MD      . polyethylene glycol (MIRALAX / GLYCOLAX) packet  17 g  17 g Oral Daily PRN Leandrew Koyanagi, MD      . QUEtiapine (SEROQUEL) tablet 25 mg  25 mg Oral QHS PRN Leandrew Koyanagi, MD      . sorbitol 70 % solution 30 mL  30 mL Oral Daily PRN Leandrew Koyanagi, MD      . valACYclovir (VALTREX) tablet 500 mg  500 mg Oral BID PRN Leandrew Koyanagi, MD      . vitamin C (ASCORBIC ACID) tablet 500 mg  500 mg Oral Daily Leandrew Koyanagi, MD   500 mg at 12/24/18 5449     Discharge Medications: Please see discharge summary for a list of discharge medications.  Relevant Imaging Results:  Relevant Lab Results:   Additional Information SSN: [id number]  Alberteen Sam, LCSW

## 2018-12-24 NOTE — Progress Notes (Signed)
Inpatient Rehab Admissions:  Inpatient Rehab Consult received.  I met with patient at the bedside.  Note she is currently ambulating at least 100' with contact guard assist.  We discussed that she is too high level for CIR level therapies and also does not meet requirement for medical necessity.  Will sign off at this time ad let CSW know.   Signed: Shann Medal, PT, DPT Admissions Coordinator [phone] 12/24/18  2:15 PM

## 2018-12-24 NOTE — Discharge Summary (Addendum)
Patient ID: Rachel Woodard MRN: [id number] DOB/AGE: Feb 21, 1951 68 y.o.  Admit date: 12/22/2018 Discharge date: 12/25/2018  Admission Diagnoses:  Principal Problem:   Unilateral primary osteoarthritis, left knee Active Problems:   Status post total left knee replacement   Discharge Diagnoses:  Same  Past Medical History:  Diagnosis Date  . Anemia 2001   after gastric bypass  . Anxiety   . Arthritis   . Breast cancer ([place]) 2013   Breast cancer    Radiation therapy  . COPD (chronic obstructive pulmonary disease) ([place])   . Depression   . Fibromyalgia   . GERD (gastroesophageal reflux disease)   . Hypertension   . Personal history of radiation therapy 2013  . Pneumonia   . Pre-diabetes   . PTSD (post-traumatic stress disorder)   . Sleep apnea    uses CPAP    Surgeries: Procedure(s): LEFT TOTAL KNEE ARTHROPLASTY on 12/22/2018   Consultants:   Discharged Condition: Improved  Hospital Course: Rachel Woodard is an 68 y.o. female who was admitted 12/22/2018 for operative treatment ofUnilateral primary osteoarthritis, left knee. Patient has severe unremitting pain that affects sleep, daily activities, and work/hobbies. After pre-op clearance the patient was taken to the operating room on 12/22/2018 and underwent  Procedure(s): LEFT TOTAL KNEE ARTHROPLASTY.    Patient was given perioperative antibiotics:  Anti-infectives (From admission, onward)   Start     Dose/Rate Route Frequency Ordered Stop   12/22/18 1600  ceFAZolin (ANCEF) IVPB 2g/100 mL premix     2 g 200 mL/hr over 30 Minutes Intravenous Every 6 hours 12/22/18 1345 12/23/18 0541   12/22/18 1440  valACYclovir (VALTREX) tablet 500 mg     500 mg Oral 2 times daily PRN 12/22/18 1345     12/22/18 1056  vancomycin (VANCOCIN) powder  Status:  Discontinued       As needed 12/22/18 1056 12/22/18 1222   12/22/18 0800  ceFAZolin (ANCEF) IVPB 2g/100 mL premix     2 g 200 mL/hr over 30 Minutes Intravenous On call to  O.R. 12/22/18 0749 12/22/18 1026   12/22/18 0000  sulfamethoxazole-trimethoprim (BACTRIM DS) 800-160 MG tablet     1 tablet Oral 2 times daily 12/22/18 1144         Patient was given sequential compression devices, early ambulation, and chemoprophylaxis to prevent DVT.  Patient benefited maximally from hospital stay and there were no complications.    Recent vital signs:  Patient Vitals for the past 24 hrs:  BP Temp Temp src Pulse Resp SpO2  12/25/18 0805 138/63 98.5 F (36.9 C) Oral 79 16 98 %  12/24/18 2036 (!) 122/59 99 F (37.2 C) Oral 69 14 98 %  12/24/18 1513 107/62 98.4 F (36.9 C) Oral 73 18 95 %  12/24/18 0913 (!) 112/53 98.7 F (37.1 C) Oral 81 18 95 %     Recent laboratory studies:  Recent Labs    12/23/18 0521  WBC 8.7  HGB 12.1  HCT 37.9  PLT 187  NA 134*  K 4.2  CL 101  CO2 25  BUN 17  CREATININE 1.36*  GLUCOSE 115*  CALCIUM 8.2*     Discharge Medications:   Allergies as of 12/25/2018      Reactions   Lisinopril Anaphylaxis   Metformin And Related Diarrhea      Medication List    TAKE these medications   amLODipine 5 MG tablet Commonly known as: NORVASC Take 1 tablet (5 mg total) by mouth daily. Dose dec  11/11/2018   aspirin EC 81 MG tablet Take 1 tablet (81 mg total) by mouth 2 (two) times daily.   Blood Pressure Monitor Devi Use as directed to check home blood pressure 2-3 times a week   carvedilol 3.125 MG tablet Commonly known as: COREG TAKE 1 TABLET (3.125 MG TOTAL) BY MOUTH 2 (TWO) TIMES DAILY WITH A MEAL.   hydrochlorothiazide 25 MG tablet Commonly known as: HYDRODIURIL Take 1 tablet (25 mg total) by mouth daily.   lamoTRIgine 100 MG tablet Commonly known as: LAMICTAL Take 100 mg by mouth daily.   loratadine 10 MG tablet Commonly known as: CLARITIN Take 10 mg by mouth daily.   methocarbamol 750 MG tablet Commonly known as: ROBAXIN Take 1 tablet (750 mg total) by mouth 2 (two) times daily as needed for muscle spasms.    ondansetron 4 MG tablet Commonly known as: ZOFRAN Take 1-2 tablets (4-8 mg total) by mouth every 8 (eight) hours as needed for nausea or vomiting.   oxyCODONE 5 MG immediate release tablet Commonly known as: Oxy IR/ROXICODONE Take 1-3 tablets (5-15 mg total) by mouth every 6 (six) hours as needed for severe pain.   oxyCODONE 10 mg 12 hr tablet Commonly known as: OXYCONTIN Take 1 tablet (10 mg total) by mouth every 12 (twelve) hours for 3 days.   pantoprazole 40 MG tablet Commonly known as: PROTONIX Take 40 mg by mouth every morning.   promethazine 25 MG tablet Commonly known as: PHENERGAN Take 1 tablet (25 mg total) by mouth every 6 (six) hours as needed for nausea.   QUEtiapine 50 MG tablet Commonly known as: SEROQUEL Take 25 mg by mouth at bedtime as needed (sleep).   senna-docusate 8.6-50 MG tablet Commonly known as: Senokot S Take 1-2 tablets by mouth at bedtime as needed.   sulfamethoxazole-trimethoprim 800-160 MG tablet Commonly known as: BACTRIM DS Take 1 tablet by mouth 2 (two) times daily.   valACYclovir 500 MG tablet Commonly known as: VALTREX Take 1 tablet (500 mg total) by mouth 2 (two) times daily. What changed:   when to take this  additional instructions   vitamin C 500 MG tablet Commonly known as: ASCORBIC ACID Take 500 mg by mouth daily.            Durable Medical Equipment  (From admission, onward)         Start     Ordered   12/22/18 1346  DME Walker rolling  Once    Question:  Patient needs a walker to treat with the following condition  Answer:  Total knee replacement status   12/22/18 1345   12/22/18 1346  DME 3 n 1  Once     12/22/18 1345   12/22/18 1346  DME Bedside commode  Once    Question:  Patient needs a bedside commode to treat with the following condition  Answer:  Total knee replacement status   12/22/18 1345          Diagnostic Studies: Dg Chest 2 View  Result Date: 12/17/2018 CLINICAL DATA:  Preop.  Pain.   COPD. EXAM: CHEST - 2 VIEW COMPARISON:  December 01, 2018 FINDINGS: The previously demonstrated right lower lobe pneumonia has significantly improved. There is no pneumothorax. No large pleural effusion. The heart size is stable. Aortic calcifications are noted. There is no acute osseous abnormality. There are few hyperdense bodies in the left upper quadrant which likely represent dense enteric material within the colon. IMPRESSION: No active cardiopulmonary disease. Electronically  Signed   By: Constance Holster M.D.   On: 12/17/2018 01:14   Dg Knee Left Port  Result Date: 12/22/2018 CLINICAL DATA:  Left total knee arthroplasty. EXAM: PORTABLE LEFT KNEE - 1-2 VIEW COMPARISON:  None. FINDINGS: The left knee demonstrates a total knee arthroplasty without evidence of hardware failure complication. There is no significant joint effusion. There is no fracture or dislocation. The alignment is anatomic. Post-surgical changes noted in the surrounding soft tissues. IMPRESSION: Interval left total knee arthroplasty. Electronically Signed   By: Kathreen Devoid   On: 12/22/2018 13:05    Disposition: Discharge disposition: 03-Skilled [place] information for follow-up providers    Leandrew Koyanagi, MD In 2 weeks.   Specialty: Orthopedic Surgery Why: For suture removal, For wound re-check Contact information: [place]  [place] [address] [phone]            Contact information for after-discharge care    Destination    [facility] Preferred SNF .   Service: Skilled Nursing Contact information: [place] [phone]                   Signed: Aundra Dubin 12/25/2018, 8:10 AM

## 2018-12-25 LAB — SARS CORONAVIRUS 2 BY RT PCR (HOSPITAL ORDER, PERFORMED IN ~~LOC~~ HOSPITAL LAB): SARS Coronavirus 2: NEGATIVE

## 2018-12-25 NOTE — Progress Notes (Signed)
Physical Therapy Treatment Patient Details Name: Natalija Mavis MRN: [id number] DOB: 16-Aug-1950 Today's Date: 12/25/2018    History of Present Illness Pt is a 68 y/o female s/p L TKA. PMH includes breast cancer, COPD, fibromyalgia, HTN, pre-diabetes, and sleep apnea on CPAP.     PT Comments    Pt continues to progress slowly.  She is heavily reliant on RW support and was not using one prior to admission.  She lacks quad strength on L which cause minor knee instability.  She lives alone at group home with out assistance and continues to benefit from skilled rehab post acutely at SNF to improve strength and function before returning home.   Follow Up Recommendations  Follow surgeon's recommendation for DC plan and follow-up therapies;Supervision for mobility/OOB     Equipment Recommendations  3in1 (PT)    Recommendations for Other Services       Precautions / Restrictions Precautions Precautions: Fall;Knee Precaution Booklet Issued: No Precaution Comments: Reviewed knee precautions with pt.  Restrictions Weight Bearing Restrictions: Yes LLE Weight Bearing: Weight bearing as tolerated    Mobility  Bed Mobility Overal bed mobility: Needs Assistance Bed Mobility: Supine to Sit     Supine to sit: Modified independent (Device/Increase time)     General bed mobility comments: No assistance from therapist patient utilized UEs to advance LLE to edge of bed.  Transfers Overall transfer level: Needs assistance Equipment used: Rolling walker (2 wheeled) Transfers: Sit to/from Stand Sit to Stand: Supervision         General transfer comment: for balance, safety, cues for hand placement  Ambulation/Gait Ambulation/Gait assistance: Min guard Gait Distance (Feet): 100 Feet Assistive device: Rolling walker (2 wheeled) Gait Pattern/deviations: Step-to pattern;Decreased step length - left;Antalgic;Step-through pattern     General Gait Details: knee flexed in stance phase  on L side.  Cues for L knee extension and softening R step to improve gt symmetry.   Stairs             Wheelchair Mobility    Modified Rankin (Stroke Patients Only)       Balance Overall balance assessment: Needs assistance Sitting-balance support: No upper extremity supported;Feet supported Sitting balance-Leahy Scale: Good     Standing balance support: Bilateral upper extremity supported;During functional activity Standing balance-Leahy Scale: Fair Standing balance comment: Reliant on BUE support for gt                            Cognition Arousal/Alertness: Awake/alert Behavior During Therapy: WFL for tasks assessed/performed Overall Cognitive Status: Within Functional Limits for tasks assessed                                        Exercises Total Joint Exercises Ankle Circles/Pumps: AROM;10 reps;Both;Supine Quad Sets: AROM;10 reps;Left;Supine Short Arc Quad: AROM;10 reps;Supine;Left Heel Slides: Left;10 reps;AROM;AAROM;Supine Hip ABduction/ADduction: AROM;Left;10 reps;Supine;AAROM Goniometric ROM: 10-70 degrees L knee    General Comments        Pertinent Vitals/Pain Pain Assessment: Faces Faces Pain Scale: Hurts even more Pain Location: L knee  Pain Descriptors / Indicators: Aching;Operative site guarding;Tightness Pain Intervention(s): Monitored during session;Repositioned    Home Living                      Prior Function            PT Goals (  current goals can now be found in the care plan section) Acute Rehab PT Goals Patient Stated Goal: to go to SNF and then go back home Potential to Achieve Goals: Good Progress towards PT goals: Progressing toward goals    Frequency    7X/week      PT Plan Current plan remains appropriate    Co-evaluation              AM-PAC PT "6 Clicks" Mobility   Outcome Measure  Help needed turning from your back to your side while in a flat bed without using  bedrails?: None Help needed moving from lying on your back to sitting on the side of a flat bed without using bedrails?: A Little Help needed moving to and from a bed to a chair (including a wheelchair)?: A Little Help needed standing up from a chair using your arms (e.g., wheelchair or bedside chair)?: A Little Help needed to walk in hospital room?: A Little Help needed climbing 3-5 steps with a railing? : A Little 6 Click Score: 19    End of Session Equipment Utilized During Treatment: Gait belt Activity Tolerance: Patient tolerated treatment well Patient left: in chair;with call bell/phone within reach Nurse Communication: Mobility status PT Visit Diagnosis: Unsteadiness on feet (R26.81);Muscle weakness (generalized) (M62.81);Difficulty in walking, not elsewhere classified (R26.2)     Time: 8466-5993 PT Time Calculation (min) (ACUTE ONLY): 16 min  Charges:  $Gait Training: 8-22 mins                     Governor Rooks, PTA Acute Rehabilitation Services Pager [phone] Office [phone]     Jeoffrey Eleazer Eli Hose 12/25/2018, 10:42 AM

## 2018-12-25 NOTE — Progress Notes (Signed)
Orthopedic Tech Progress Note Patient Details:  Rachel Woodard [birth year] [id number]  CPM Left Knee CPM Left Knee: Off Left Knee Flexion (Degrees): 70 Left Knee Extension (Degrees): 0 Additional Comments: Pt didn't what to go in [place] 12/25/2018, 4:45 PM

## 2018-12-25 NOTE — Progress Notes (Signed)
Orthopedic Tech Progress Note Patient Details:  Rachel Woodard [birth year] [id number]  Patient ID: [name], female   DOB: 1951/02/17, 68 y.o.   MRN: [id number] Pt refused cpm.  [name] 12/25/2018, 6:37 AM

## 2018-12-25 NOTE — Plan of Care (Signed)

## 2018-12-25 NOTE — Progress Notes (Signed)
CSW updated [facility] that COVID test is in negative. They report they are still pending patient's [facility], hopeful it will come in by end of day today.   [place], [place]

## 2018-12-25 NOTE — Progress Notes (Signed)
Subjective: 3 Days Post-Op Procedure(s) (LRB): LEFT TOTAL KNEE ARTHROPLASTY (Left) Patient reports pain as mild.  Doing well with PT.  Not a candidate for CIR.   Objective: Vital signs in last 24 hours: Temp:  [98.4 F (36.9 C)-99 F (37.2 C)] 99 F (37.2 C) (07/01 2036) Pulse Rate:  [69-81] 69 (07/01 2036) Resp:  [14-18] 14 (07/01 2036) BP: (107-122)/(53-62) 122/59 (07/01 2036) SpO2:  [95 %-98 %] 98 % (07/01 2036)  Intake/Output from previous day: 07/01 0701 - 07/02 0700 In: 480 [P.O.:480] Out: -  Intake/Output this shift: No intake/output data recorded.  Recent Labs    12/23/18 0521  HGB 12.1   Recent Labs    12/23/18 0521  WBC 8.7  RBC 4.34  HCT 37.9  PLT 187   Recent Labs    12/23/18 0521  NA 134*  K 4.2  CL 101  CO2 25  BUN 17  CREATININE 1.36*  GLUCOSE 115*  CALCIUM 8.2*   No results for input(s): LABPT, INR in the last 72 hours.  Neurologically intact Neurovascular intact Sensation intact distally Intact pulses distally Dorsiflexion/Plantar flexion intact Incision: dressing C/D/I No cellulitis present Compartment soft   Assessment/Plan: 3 Days Post-Op Procedure(s) (LRB): LEFT TOTAL KNEE ARTHROPLASTY (Left) Advance diet Up with therapy D/C IV fluids Discharge to SNF once ins approved and bed available (likely today) WBAT RLE F/u with Dr. Erlinda Hong 2 weeks post-op    Anticipated LOS equal to or greater than 2 midnights due to - Age 10 and older with one or more of the following:  - Obesity  - Expected need for hospital services (PT, OT, Nursing) required for safe  discharge  - Anticipated need for postoperative skilled nursing care or inpatient rehab  - Active co-morbidities: None OR   - Unanticipated findings during/Post Surgery: Slow post-op progression: GI, pain control, mobility  - Patient is a high risk of re-admission due to: Barriers to post-acute care (logistical, no family support in home)    Aundra Dubin 12/25/2018, 7:40  AM

## 2018-12-26 DIAGNOSIS — R5381 Other malaise: Secondary | ICD-10-CM | POA: Diagnosis not present

## 2018-12-26 DIAGNOSIS — Z7401 Bed confinement status: Secondary | ICD-10-CM | POA: Diagnosis not present

## 2018-12-26 DIAGNOSIS — I10 Essential (primary) hypertension: Secondary | ICD-10-CM | POA: Diagnosis not present

## 2018-12-26 DIAGNOSIS — G4700 Insomnia, unspecified: Secondary | ICD-10-CM | POA: Diagnosis not present

## 2018-12-26 DIAGNOSIS — M25562 Pain in left knee: Secondary | ICD-10-CM | POA: Diagnosis not present

## 2018-12-26 DIAGNOSIS — M2550 Pain in unspecified joint: Secondary | ICD-10-CM | POA: Diagnosis not present

## 2018-12-26 DIAGNOSIS — M6281 Muscle weakness (generalized): Secondary | ICD-10-CM | POA: Diagnosis not present

## 2018-12-26 DIAGNOSIS — M797 Fibromyalgia: Secondary | ICD-10-CM | POA: Diagnosis not present

## 2018-12-26 DIAGNOSIS — M172 Bilateral post-traumatic osteoarthritis of knee: Secondary | ICD-10-CM | POA: Diagnosis not present

## 2018-12-26 DIAGNOSIS — F329 Major depressive disorder, single episode, unspecified: Secondary | ICD-10-CM | POA: Diagnosis not present

## 2018-12-26 DIAGNOSIS — K219 Gastro-esophageal reflux disease without esophagitis: Secondary | ICD-10-CM | POA: Diagnosis not present

## 2018-12-26 DIAGNOSIS — Z4789 Encounter for other orthopedic aftercare: Secondary | ICD-10-CM | POA: Diagnosis not present

## 2018-12-26 NOTE — Discharge Summary (Signed)
Discharge Diagnoses:  Principal Problem:   Unilateral primary osteoarthritis, left knee Active Problems:   Status post total left knee replacement   Surgeries: Procedure(s): LEFT TOTAL KNEE ARTHROPLASTY on 12/22/2018    Consultants:   Discharged Condition: Improved  Hospital Course: Rachel Woodard is an 68 y.o. female who was admitted 12/22/2018 with a chief complaint of left knee arthritis, with a final diagnosis of left knee degenerative joint disease.  Patient was brought to the operating room on 12/22/2018 and underwent Procedure(s): LEFT TOTAL KNEE ARTHROPLASTY.    Patient was given perioperative antibiotics:  Anti-infectives (From admission, onward)   Start     Dose/Rate Route Frequency Ordered Stop   12/22/18 1600  ceFAZolin (ANCEF) IVPB 2g/100 mL premix     2 g 200 mL/hr over 30 Minutes Intravenous Every 6 hours 12/22/18 1345 12/23/18 0541   12/22/18 1440  valACYclovir (VALTREX) tablet 500 mg     500 mg Oral 2 times daily PRN 12/22/18 1345     12/22/18 1056  vancomycin (VANCOCIN) powder  Status:  Discontinued       As needed 12/22/18 1056 12/22/18 1222   12/22/18 0800  ceFAZolin (ANCEF) IVPB 2g/100 mL premix     2 g 200 mL/hr over 30 Minutes Intravenous On call to O.R. 12/22/18 0749 12/22/18 1026   12/22/18 0000  sulfamethoxazole-trimethoprim (BACTRIM DS) 800-160 MG tablet     1 tablet Oral 2 times daily 12/22/18 1144      .  Patient was given sequential compression devices, early ambulation, and aspirin for DVT prophylaxis.  Recent vital signs:  Patient Vitals for the past 24 hrs:  BP Temp Temp src Pulse Resp SpO2  12/26/18 0740 124/63 98.6 F (37 C) Oral 85 18 93 %  12/26/18 0244 (!) 111/54 98.8 F (37.1 C) Oral 72 16 100 %  12/25/18 1956 (!) 101/33 99.4 F (37.4 C) Oral 77 15 95 %  12/25/18 1550 115/62 - - 68 17 98 %  .  Recent laboratory studies: No results found.  Discharge Medications:   Allergies as of 12/26/2018      Reactions   Lisinopril  Anaphylaxis   Metformin And Related Diarrhea      Medication List    TAKE these medications   amLODipine 5 MG tablet Commonly known as: NORVASC Take 1 tablet (5 mg total) by mouth daily. Dose dec 11/11/2018   aspirin EC 81 MG tablet Take 1 tablet (81 mg total) by mouth 2 (two) times daily.   Blood Pressure Monitor Devi Use as directed to check home blood pressure 2-3 times a week   carvedilol 3.125 MG tablet Commonly known as: COREG TAKE 1 TABLET (3.125 MG TOTAL) BY MOUTH 2 (TWO) TIMES DAILY WITH A MEAL.   hydrochlorothiazide 25 MG tablet Commonly known as: HYDRODIURIL Take 1 tablet (25 mg total) by mouth daily. Notes to patient: Resume home regimen   lamoTRIgine 100 MG tablet Commonly known as: LAMICTAL Take 100 mg by mouth daily.   loratadine 10 MG tablet Commonly known as: CLARITIN Take 10 mg by mouth daily.   methocarbamol 750 MG tablet Commonly known as: ROBAXIN Take 1 tablet (750 mg total) by mouth 2 (two) times daily as needed for muscle spasms.   ondansetron 4 MG tablet Commonly known as: ZOFRAN Take 1-2 tablets (4-8 mg total) by mouth every 8 (eight) hours as needed for nausea or vomiting.   oxyCODONE 5 MG immediate release tablet Commonly known as: Oxy IR/ROXICODONE Take 1-3 tablets (5-15  mg total) by mouth every 6 (six) hours as needed for severe pain.   pantoprazole 40 MG tablet Commonly known as: PROTONIX Take 40 mg by mouth every morning.   promethazine 25 MG tablet Commonly known as: PHENERGAN Take 1 tablet (25 mg total) by mouth every 6 (six) hours as needed for nausea.   QUEtiapine 50 MG tablet Commonly known as: SEROQUEL Take 25 mg by mouth at bedtime as needed (sleep). Notes to patient: Resume home regimen   senna-docusate 8.6-50 MG tablet Commonly known as: Senokot S Take 1-2 tablets by mouth at bedtime as needed.   sulfamethoxazole-trimethoprim 800-160 MG tablet Commonly known as: BACTRIM DS Take 1 tablet by mouth 2 (two) times  daily.   valACYclovir 500 MG tablet Commonly known as: VALTREX Take 1 tablet (500 mg total) by mouth 2 (two) times daily. What changed:   when to take this  additional instructions Notes to patient: Resume home regimen   vitamin C 500 MG tablet Commonly known as: ASCORBIC ACID Take 500 mg by mouth daily.     ASK your doctor about these medications   oxyCODONE 10 mg 12 hr tablet Commonly known as: OXYCONTIN Take 1 tablet (10 mg total) by mouth every 12 (twelve) hours for 3 days. Ask about: Should I take this medication?            Durable Medical Equipment  (From admission, onward)         Start     Ordered   12/22/18 1346  DME Walker rolling  Once    Question:  Patient needs a walker to treat with the following condition  Answer:  Total knee replacement status   12/22/18 1345   12/22/18 1346  DME 3 n 1  Once     12/22/18 1345   12/22/18 1346  DME Bedside commode  Once    Question:  Patient needs a bedside commode to treat with the following condition  Answer:  Total knee replacement status   12/22/18 1345          Diagnostic Studies: Dg Chest 2 View  Result Date: 12/17/2018 CLINICAL DATA:  Preop.  Pain.  COPD. EXAM: CHEST - 2 VIEW COMPARISON:  December 01, 2018 FINDINGS: The previously demonstrated right lower lobe pneumonia has significantly improved. There is no pneumothorax. No large pleural effusion. The heart size is stable. Aortic calcifications are noted. There is no acute osseous abnormality. There are few hyperdense bodies in the left upper quadrant which likely represent dense enteric material within the colon. IMPRESSION: No active cardiopulmonary disease. Electronically Signed   By: Constance Holster M.D.   On: 12/17/2018 01:14   Dg Knee Left Port  Result Date: 12/22/2018 CLINICAL DATA:  Left total knee arthroplasty. EXAM: PORTABLE LEFT KNEE - 1-2 VIEW COMPARISON:  None. FINDINGS: The left knee demonstrates a total knee arthroplasty without evidence of  hardware failure complication. There is no significant joint effusion. There is no fracture or dislocation. The alignment is anatomic. Post-surgical changes noted in the surrounding soft tissues. IMPRESSION: Interval left total knee arthroplasty. Electronically Signed   By: Kathreen Devoid   On: 12/22/2018 13:05    Patient benefited maximally from their hospital stay and there were no complications.     Disposition: Discharge disposition: 03-Skilled Nursing Facility      Discharge Instructions    Call MD / Call 911   Complete by: As directed    If you experience chest pain or shortness of breath, CALL  911 and be transported to the hospital emergency room.  If you develope a fever above 101 F, pus (white drainage) or increased drainage or redness at the wound, or calf pain, call your surgeon's office.   Constipation Prevention   Complete by: As directed    Drink plenty of fluids.  Prune juice may be helpful.  You may use a stool softener, such as Colace (over the counter) 100 mg twice a day.  Use MiraLax (over the counter) for constipation as needed.   Diet - low sodium heart healthy   Complete by: As directed    Increase activity slowly as tolerated   Complete by: As directed       Contact information for follow-up providers    Leandrew Koyanagi, MD In 2 weeks.   Specialty: Orthopedic Surgery Why: For suture removal, For wound re-check Contact information: [place] [address] [phone]            Contact information for after-discharge care    Destination    [place] Preferred SNF .   Service: Skilled Nursing Contact information: [address] [place] [phone]                   Signed: Newt Minion 12/26/2018, 1:31 PM

## 2018-12-26 NOTE — TOC Transition Note (Signed)
Transition of Care [facility]) - CM/SW Discharge Note   Patient Details  Name: Rachel Woodard MRN: [id number] Date of Birth: [birth year]  [facility] CM/SW Contact:  Weston Anna, LCSW Phone Number: 12/26/2018, 2:11 PM   Clinical Narrative:     Patient set to DC to [facility] SNF- no family members noted on facesheet to be notified. Please call report to [phone]. Patient will need transportation via [facility]    Barriers to Discharge: Continued Medical Work up   Patient Goals and CMS Choice Patient states their goals for this hospitalization and ongoing recovery are:: to go to inpatient rehab CMS Medicare.gov Compare Post Acute Care list provided to:: Patient Choice offered to / list presented to : Patient  Discharge Placement                       Discharge Plan and Services     Post Acute Care Choice: [place]                               Social Determinants of Health (SDOH) Interventions     Readmission Risk Interventions No flowsheet data found.

## 2018-12-26 NOTE — Progress Notes (Signed)
Patient ID: Rachel Woodard, female   DOB: Nov 02, 1950, 68 y.o.   MRN: [id number] Patient is status post left total knee arthroplasty.  Patient states that she had side effects from the Vicodin and stopped this and she states she has had a reaction to tramadol in the past she is currently on Neurontin and states that this is insufficient.  Percocet was ordered.  Awaiting authorization for discharge to skilled nursing.

## 2018-12-26 NOTE — Progress Notes (Signed)
Patient discharging to [facility]. Report given to [facility] LPN. Patient left unit via PTAR.

## 2018-12-26 NOTE — Progress Notes (Signed)
Physical Therapy Treatment Patient Details Name: Rachel Woodard MRN: [id number] DOB: 1951-01-30 Today's Date: 12/26/2018    History of Present Illness Pt is a 68 y/o female s/p L TKA. PMH includes breast cancer, COPD, fibromyalgia, HTN, pre-diabetes, and sleep apnea on CPAP.     PT Comments    Pt continues to make steady progress.  She does present with minor safety concerns and lacks support at home that she needs during recovery. Based on her need for continued supervision she will require short term rehab.      Follow Up Recommendations  Follow surgeon's recommendation for DC plan and follow-up therapies;Supervision for mobility/OOB     Equipment Recommendations  3in1 (PT)    Recommendations for Other Services       Precautions / Restrictions Precautions Precautions: Fall;Knee Precaution Booklet Issued: No Precaution Comments: Reviewed knee precautions with pt.  Restrictions Weight Bearing Restrictions: Yes LLE Weight Bearing: Weight bearing as tolerated    Mobility  Bed Mobility               General bed mobility comments: Pt in recliner chair on arrival.  Transfers Overall transfer level: Needs assistance Equipment used: Rolling walker (2 wheeled) Transfers: Sit to/from Stand Sit to Stand: Supervision         General transfer comment: Cues to safety to back entirely to seated surface before sitting down.  Ambulation/Gait Ambulation/Gait assistance: Supervision Gait Distance (Feet): 200 Feet Assistive device: Rolling walker (2 wheeled) Gait Pattern/deviations: Step-to pattern;Decreased step length - left;Antalgic;Step-through pattern     General Gait Details: Pt required cues for upper trunk control and gt symmetry   Stairs             Wheelchair Mobility    Modified Rankin (Stroke Patients Only)       Balance Overall balance assessment: Needs assistance Sitting-balance support: No upper extremity supported;Feet  supported Sitting balance-Leahy Scale: Good     Standing balance support: Bilateral upper extremity supported;During functional activity Standing balance-Leahy Scale: Fair                              Cognition Arousal/Alertness: Awake/alert Behavior During Therapy: WFL for tasks assessed/performed Overall Cognitive Status: Within Functional Limits for tasks assessed                                        Exercises Total Joint Exercises Ankle Circles/Pumps: AROM;10 reps;Both;Supine Quad Sets: AROM;10 reps;Left;Supine Short Arc Quad: AROM;10 reps;Supine;Left Heel Slides: Left;10 reps;AROM;AAROM;Supine Hip ABduction/ADduction: AROM;Left;10 reps;Supine;AAROM Straight Leg Raises: AROM;Left;10 reps;Supine Long Arc Quad: AROM;Left;10 reps;Seated Goniometric ROM: 5-71 degrees L knee.    General Comments        Pertinent Vitals/Pain Pain Assessment: Faces Pain Score: 5  Pain Location: L knee  Pain Descriptors / Indicators: Aching;Operative site guarding;Tightness    Home Living                      Prior Function            PT Goals (current goals can now be found in the care plan section) Acute Rehab PT Goals Patient Stated Goal: to go to SNF and then go back home Potential to Achieve Goals: Good Progress towards PT goals: Progressing toward goals    Frequency    7X/week      PT  Plan Current plan remains appropriate    Co-evaluation              AM-PAC PT "6 Clicks" Mobility   Outcome Measure  Help needed turning from your back to your side while in a flat bed without using bedrails?: None Help needed moving from lying on your back to sitting on the side of a flat bed without using bedrails?: A Little Help needed moving to and from a bed to a chair (including a wheelchair)?: A Little Help needed standing up from a chair using your arms (e.g., wheelchair or bedside chair)?: A Little Help needed to walk in hospital  room?: A Little Help needed climbing 3-5 steps with a railing? : A Little 6 Click Score: 19    End of Session Equipment Utilized During Treatment: Gait belt Activity Tolerance: Patient tolerated treatment well Patient left: in chair;with call bell/phone within reach Nurse Communication: Mobility status PT Visit Diagnosis: Unsteadiness on feet (R26.81);Muscle weakness (generalized) (M62.81);Difficulty in walking, not elsewhere classified (R26.2)     Time: 1696-7893 PT Time Calculation (min) (ACUTE ONLY): 23 min  Charges:  $Gait Training: 8-22 mins $Therapeutic Exercise: 8-22 mins                     Governor Rooks, PTA Acute Rehabilitation Services Pager [phone] Office [phone]     Kadian Barcellos Eli Hose 12/26/2018, 4:28 PM

## 2018-12-29 ENCOUNTER — Other Ambulatory Visit: Payer: Self-pay | Admitting: *Deleted

## 2018-12-29 ENCOUNTER — Other Ambulatory Visit: Payer: Self-pay

## 2018-12-29 ENCOUNTER — Encounter: Payer: Self-pay | Admitting: *Deleted

## 2018-12-29 NOTE — Patient Outreach (Addendum)
[facility]) Care Management  12/29/2018  Salihah Peace-Hughes [birth year] [id number]   EMMI-general discharge from [place]  ED ON [place] Day # 1 Date: Sunday 12/28/18 [place] Reason: scheduled follow-up? No Insurance: [employer] HMO  [facility] admissions x 1  ED visits x in the last 6 months  Admission 12/22/18 to 12/25/18 for unilateral primary ;eft knee osteoarthritis with s/p post total left knee replacement   Outreach attempt # 1 successful to the home number  Patient is able to verify HIPAA [place] Management RN reviewed and addressed red alert with patient  EMMI:  Mrs Bridgett Larsson states the EMMI answer is incorrect she states she thought the question "was asking me about my specialist" She confirms today that she is scheduled to see Dr Erlinda Hong her orthopedic specialist on Monday. January 05 2019  Dr Wynetta Emery her primary care MD was seen last month via a video visit on 12/15/18   [facility] RN CM discussed importance of f/u to pcp and specialist   Transition of care assessment completed  Social: Mrs Harju is widowed and states she has a good support system, friends. She lives alone in a single family recovery home She denies concerns with her care needs and transportation to medical appointments    Conditions: S/p total left knee replacement 12/22/18, HTN, OSA, GERD, autoimmune hepatitis, right renal cyst, simplex genital herpes, fibromyalgia, substance abuse, anxiety and depression, angioedema, tobacco abuse, postmenopausal bleeding, hx of breast cancer ([place] with lumpectomy and XRT), urge incontinence,  solitary lung nodule, class 2 obesity DCIS, substance abuse in remission (crack cocaine), COPD, pre diabetes  DME: BP monitor, RW, 3 n 1 , bedside commode, eyeglasses, dentures, CPAP, cane  Medications:  She denies concerns with taking medications as prescribed, affording medications, side effects of medications and questions about  medications  Advance Directives: She denies need for assist with advance directives    Consent: [facility] RN CM reviewed [facility] services with patient. Patient gave verbal consent for services [facility] telephonic RN CM.   Advised patient that there will be further automated EMMI- post discharge calls to assess how the patient is doing following the recent hospitalization Advised the patient that another call may be received from a nurse if any of their responses were abnormal. Patient voiced understanding and was appreciative of f/u call.   Plan: [facility] RN CM will close case at this time as patient has been assessed and no needs identified/needs resolved.   Pt encouraged to return a call to [place] CM prn  [facility] RN CM sent a successful outreach letter as discussed with [facility] brochure enclosed for review  Ellsworth Waldschmidt L. Lavina Hamman, RN, BSN, [place] Coordinator Office number [phone] Mobile number [phone]  Main [facility] number [phone] Fax number [phone]

## 2018-12-30 ENCOUNTER — Telehealth: Payer: Self-pay

## 2018-12-30 ENCOUNTER — Other Ambulatory Visit: Payer: Self-pay

## 2018-12-30 ENCOUNTER — Telehealth: Payer: Self-pay | Admitting: Orthopaedic Surgery

## 2018-12-30 NOTE — Telephone Encounter (Signed)
Patient called stating after being discharged from the hospital she went to [facility] for rehab.  Patient states she only stayed one day.  Patient states "it was terrible there."  Patient states she is home now and needing in-home therapy.  Patient states she needs medical supplies including a chair for the shower and adapter for the toilet.  Please advise.

## 2018-12-30 NOTE — Telephone Encounter (Signed)
I called pt and advised [facility] will call to set up in home therapy and rx in the mail per pt request for 3 in 1 commode she can take this to [facility] or [facility] discount medical to fill.

## 2018-12-30 NOTE — Telephone Encounter (Signed)
Trying to check on DME for the pt to have at home. Referral for physical therapy has been picked up by [facility] this morning.

## 2018-12-30 NOTE — Telephone Encounter (Signed)
Called [facility] and they asked for an order to be written for physical therapy to eval and treat. I printed the demo sheet and left this for pick up at the front desk. I will have to call [place] for the equipment or try and send an order to [facility] for raised commode/ shower chair.

## 2018-12-31 ENCOUNTER — Encounter: Payer: Self-pay | Admitting: Internal Medicine

## 2018-12-31 NOTE — Progress Notes (Signed)
I received records from [place] Dr. Alessandra Bevels.  Patient had EGD 07/2018 for evaluation of dysphasia.  This revealed large hiatal hernia, nonobstructive lower esophageal ring that was dilated and small submucosal nodule in the distal esophagus.  Biopsy revealed granular cell tumor.  Patient was told that this is low risk of malignancy because of small size of lesion but she will need surveillance with repeat EGD in 6 to 12 months.  Colonoscopy was attempted but aborted because of poor prep.  Random biopsies were negative for microscopic colitis.  Patient was referred to [facility] for evaluation of overlap syndrome of autoimmune hepatitis and primary biliary cholangitis.

## 2019-01-02 ENCOUNTER — Telehealth: Payer: Self-pay

## 2019-01-02 DIAGNOSIS — G4733 Obstructive sleep apnea (adult) (pediatric): Secondary | ICD-10-CM | POA: Diagnosis not present

## 2019-01-02 NOTE — Telephone Encounter (Signed)
Patient called stating that she has a pain above the crease of her left knee. No swelling. The pain has been there all night.  Stated that she took a muscle relaxer, but it didn't help.  Tried using ice and taking an Aleve.  Cb# is [phone].  Please advise.  Thank you.

## 2019-01-02 NOTE — Telephone Encounter (Signed)
What crease is she referring to?

## 2019-01-02 NOTE — Telephone Encounter (Signed)
That's likely from the incision and postsurgical pain.  I would recommend icing or using heat on that area around the clock.  Should resolve in a day or two

## 2019-01-02 NOTE — Telephone Encounter (Signed)
Talked with patient and advised her of message below, per Dr. Erlinda Hong.  Patient stated that she would try using heat.

## 2019-01-02 NOTE — Telephone Encounter (Addendum)
Talked with patient and advised her of your message and she stated the left side above the crease.  Called patient back and she stated that it's the front of the knee, top part.

## 2019-01-03 DIAGNOSIS — R7303 Prediabetes: Secondary | ICD-10-CM | POA: Diagnosis not present

## 2019-01-03 DIAGNOSIS — M797 Fibromyalgia: Secondary | ICD-10-CM | POA: Diagnosis not present

## 2019-01-03 DIAGNOSIS — F419 Anxiety disorder, unspecified: Secondary | ICD-10-CM | POA: Diagnosis not present

## 2019-01-03 DIAGNOSIS — Z471 Aftercare following joint replacement surgery: Secondary | ICD-10-CM | POA: Diagnosis not present

## 2019-01-03 DIAGNOSIS — K219 Gastro-esophageal reflux disease without esophagitis: Secondary | ICD-10-CM | POA: Diagnosis not present

## 2019-01-03 DIAGNOSIS — F329 Major depressive disorder, single episode, unspecified: Secondary | ICD-10-CM | POA: Diagnosis not present

## 2019-01-03 DIAGNOSIS — I10 Essential (primary) hypertension: Secondary | ICD-10-CM | POA: Diagnosis not present

## 2019-01-03 DIAGNOSIS — J449 Chronic obstructive pulmonary disease, unspecified: Secondary | ICD-10-CM | POA: Diagnosis not present

## 2019-01-03 DIAGNOSIS — D649 Anemia, unspecified: Secondary | ICD-10-CM | POA: Diagnosis not present

## 2019-01-05 ENCOUNTER — Telehealth: Payer: Self-pay | Admitting: Orthopaedic Surgery

## 2019-01-05 NOTE — Telephone Encounter (Signed)
Rachel Woodard with [facility] called in requesting verbal order for this pt for home health pt for 1 time a week for 1 week, 3 times a week for 1 week and 2 times a week for 1 week.  [phone]

## 2019-01-05 NOTE — Telephone Encounter (Signed)
IC verbal given.  

## 2019-01-05 NOTE — Telephone Encounter (Signed)
Ok, she should be coming to see [place] soon.  We may end up transitioning to oppt

## 2019-01-06 ENCOUNTER — Ambulatory Visit (INDEPENDENT_AMBULATORY_CARE_PROVIDER_SITE_OTHER): Payer: Medicare HMO | Admitting: Physician Assistant

## 2019-01-06 ENCOUNTER — Encounter: Payer: Self-pay | Admitting: Orthopaedic Surgery

## 2019-01-06 ENCOUNTER — Other Ambulatory Visit: Payer: Self-pay

## 2019-01-06 DIAGNOSIS — Z96652 Presence of left artificial knee joint: Secondary | ICD-10-CM

## 2019-01-06 NOTE — Addendum Note (Signed)
Addended byLaurann Montana on: 01/06/2019 09:48 AM   Modules accepted: Orders

## 2019-01-06 NOTE — Progress Notes (Signed)
Post-Op Visit Note   Patient: Rachel Woodard           Date of Birth: 19-Nov-1950           MRN: [id number] Visit Date: 01/06/2019 PCP: Ladell Pier, MD   Assessment & Plan:  Chief Complaint:  Chief Complaint  Patient presents with  . Left Knee - Routine Post Op   Visit Diagnoses:  1. S/P TKR (total knee replacement), left     Plan: Patient is a pleasant 68 year old female who presents our clinic for 2 weeks status post left total knee replacement, date of surgery 12/22/2018.  She has been doing well.  She went to rehab 1 night following her discharge from the hospital but due to to them treating her so poorly she was discharged home early.  She has not had any home health physical therapy.  She has minimal pain and is not taking her narcotic pain medication.  No fevers or chills.  Overall, doing well.  Examination of her left knee reveals a well-healing surgical incision.  No evidence of infection or cellulitis.  Calf is soft and nontender.  She is neurovascularly intact distally.  At this point, new Steri-Strips were applied.  The patient notes that home health physical therapy is supposed to start tomorrow, but we will have her start outpatient physical therapy instead.  A prescription was provided today to the patient for this.  Dental prophylaxis reinforced.  She will follow-up with [place] in 4 weeks time for repeat evaluation and x-rays.  Call with concerns or questions in the meantime.  Follow-Up Instructions: Return in about 4 weeks (around 02/03/2019).   Orders:  No orders of the defined types were placed in this encounter.  No orders of the defined types were placed in this encounter.   Imaging: No new imaging  PMFS History: Patient Active Problem List   Diagnosis Date Noted  . Status post total left knee replacement 12/22/2018  . MDD (major depressive disorder), recurrent episode, mild ([place]) 10/29/2018  . Autoimmune hepatitis ([place]) 10/29/2018  . OSA  (obstructive sleep apnea) 10/29/2018  . Renal cyst, right 10/22/2018  . Lung nodule, solitary 10/22/2018  . Genital herpes simplex 10/29/2017  . Vitamin D deficiency 10/29/2017  . Class 2 obesity due to excess calories without serious comorbidity with body mass index (BMI) of 38.0 to 38.9 in adult 10/29/2017  . Essential hypertension 10/29/2017  . Pre-diabetes 02/14/2017  . Fibromyalgia 02/14/2017  . Substance abuse in remission ([place]) 02/14/2017  . Anxiety and depression 02/14/2017  . HX: breast cancer 02/14/2017  . Urge incontinence 11/13/2016  . Unilateral primary osteoarthritis, left knee 06/14/2016  . PMB (postmenopausal bleeding) 04/02/2016  . GERD (gastroesophageal reflux disease) 11/02/2015  . Angioedema 10/24/2013  . Tobacco abuse 10/24/2013  . DCIS (ductal carcinoma in situ) of breast 04/07/2013  . Depression 04/07/2013   Past Medical History:  Diagnosis Date  . Anemia 2001   after gastric bypass  . Anxiety   . Arthritis   . Breast cancer ([place]) 2013   Breast cancer    Radiation therapy  . COPD (chronic obstructive pulmonary disease) ([place])   . Depression   . Fibromyalgia   . GERD (gastroesophageal reflux disease)   . Hypertension   . Personal history of radiation therapy 2013  . Pneumonia   . Pre-diabetes   . PTSD (post-traumatic stress disorder)   . Sleep apnea    uses CPAP    History reviewed. No pertinent family history.  Past Surgical History:  Procedure Laterality Date  . ABDOMINAL HYSTERECTOMY  2018  . BREAST LUMPECTOMY Right 2013  . GASTRIC BYPASS  2001  . TOTAL KNEE ARTHROPLASTY Left 12/22/2018   Procedure: LEFT TOTAL KNEE ARTHROPLASTY;  Surgeon: Leandrew Koyanagi, MD;  Location: [place];  Service: Orthopedics;  Laterality: Left;   Social History   Occupational History  . Not on file  Tobacco Use  . Smoking status: Current Every Day Smoker    Packs/day: 0.50    Types: Cigarettes  . Smokeless tobacco: Never Used  Substance and Sexual Activity  .  Alcohol use: Yes    Comment: seldom  . Drug use: No    Types: Cocaine    Comment: clean x 60 days on 11/28/15  . Sexual activity: Yes

## 2019-01-12 ENCOUNTER — Ambulatory Visit: Payer: Medicare HMO | Admitting: Physical Therapy

## 2019-01-15 ENCOUNTER — Encounter: Payer: Self-pay | Admitting: Physical Therapy

## 2019-01-15 ENCOUNTER — Other Ambulatory Visit: Payer: Self-pay

## 2019-01-15 ENCOUNTER — Ambulatory Visit: Payer: Medicare HMO | Attending: Physician Assistant | Admitting: Physical Therapy

## 2019-01-15 DIAGNOSIS — M25562 Pain in left knee: Secondary | ICD-10-CM | POA: Diagnosis not present

## 2019-01-15 DIAGNOSIS — M6281 Muscle weakness (generalized): Secondary | ICD-10-CM | POA: Insufficient documentation

## 2019-01-15 DIAGNOSIS — R600 Localized edema: Secondary | ICD-10-CM

## 2019-01-15 DIAGNOSIS — M25662 Stiffness of left knee, not elsewhere classified: Secondary | ICD-10-CM | POA: Insufficient documentation

## 2019-01-15 DIAGNOSIS — G8929 Other chronic pain: Secondary | ICD-10-CM | POA: Insufficient documentation

## 2019-01-15 NOTE — Therapy (Signed)
[place], [place], [address] Phone: [phone]   Fax:  [phone]  Physical Therapy Evaluation  Patient Details  Name: Rachel Woodard MRN: [id number] Date of Birth: 01-12-1951 Referring Provider (PT): Aundra Dubin, [place]   Encounter Date: 01/15/2019  PT End of Session - 01/15/19 1629    Visit Number  1    Number of Visits  17    Date for PT Re-Evaluation  03/12/19    Authorization Type  MCR: Kx mod by 15th visit, progress note at 10th visit    PT Start Time  1630    PT Stop Time  1720    PT Time Calculation (min)  50 min    Activity Tolerance  Patient tolerated treatment well    Behavior During Therapy  [facility] for tasks assessed/performed       Past Medical History:  Diagnosis Date  . Anemia 2001   after gastric bypass  . Anxiety   . Arthritis   . Breast cancer ([place]) 2013   Breast cancer    Radiation therapy  . COPD (chronic obstructive pulmonary disease) ([place])   . Depression   . Fibromyalgia   . GERD (gastroesophageal reflux disease)   . Hypertension   . Personal history of radiation therapy 2013  . Pneumonia   . Pre-diabetes   . PTSD (post-traumatic stress disorder)   . Sleep apnea    uses CPAP    Past Surgical History:  Procedure Laterality Date  . ABDOMINAL HYSTERECTOMY  2018  . BREAST LUMPECTOMY Right 2013  . GASTRIC BYPASS  2001  . TOTAL KNEE ARTHROPLASTY Left 12/22/2018   Procedure: LEFT TOTAL KNEE ARTHROPLASTY;  Surgeon: Leandrew Koyanagi, MD;  Location: [facility];  Service: Orthopedics;  Laterality: Left;    There were no vitals filed for this visit.   Subjective Assessment - 01/15/19 1640    Subjective  pt is a 68 y.o F s/p L TKA on 12/22/2018. She reports the pain isn't too bad, the biggest issues is the swelling. reports some pain/ symptoms down the leg into the calf/ ankle.    Limitations  Standing    How long can you sit comfortably?  30 min    How long can you stand  comfortably?  15-20 min    How long can you walk comfortably?  30 min    Diagnostic tests  x-ray    Patient Stated Goals  to lose weight, to be best self, return to walking,    Currently in Pain?  Yes    Pain Score  0-No pain   at worst 6/10   Pain Location  Knee    Pain Orientation  Left;Medial;Lateral    Pain Descriptors / Indicators  Shooting    Pain Type  Surgical pain    Pain Onset  More than a month ago    Pain Frequency  Intermittent    Aggravating Factors   sitting still, bending, stairs    Pain Relieving Factors  moving around, standing up, medication / ice PRN    Effect of Pain on Daily Activities  limited standing / walking         [facility] PT Assessment - 01/15/19 0001      Assessment   Medical Diagnosis  L TKA    Referring Provider (PT)  Aundra Dubin, PA-C    Onset Date/Surgical Date  12/22/18    Hand Dominance  Right    Next MD Visit  02/03/2019      Precautions   Precautions  None      Restrictions   Weight Bearing Restrictions  No      Balance Screen   Has the patient fallen in the past 6 months  No      [place] residence    Living Arrangements  Non-relatives/Friends    Available Help at Discharge  Family    Type of [place] to enter    Entrance Stairs-Number of Steps  5    Entrance Stairs-Rails  Can reach both    [place]  One level    [place] - single point;Walker - 2 wheels      Prior Function   Level of Independence  Independent    Vocation  On disability    Leisure  exercise, reading, cooking, music      Cognition   Overall Cognitive Status  Within Functional Limits for tasks assessed      Observation/Other Assessments   Focus on Therapeutic Outcomes (FOTO)   46% limited   predicted 39% limited     Observation/Other Assessments-Edema    Edema  Circumferential      Circumferential Edema   Circumferential - Right  @ joint line 49cm    Circumferential -  Left   @ joint line 49.5cm , 10cm above 66cm , 10 cm below 44.2cm      Posture/Postural Control   Posture/Postural Control  Postural limitations    Postural Limitations  Rounded Shoulders;Forward head      ROM / Strength   AROM / PROM / Strength  AROM;Strength;PROM      AROM   AROM Assessment Site  Knee    Right/Left Knee  Right;Left    Right Knee Extension  1    Right Knee Flexion  105    Left Knee Extension  17    Left Knee Flexion  70      PROM   PROM Assessment Site  Knee    Right/Left Knee  Left    Left Knee Extension  13    Left Knee Flexion  76      Strength   Strength Assessment Site  Knee    Right/Left Knee  Right;Left      Palpation   Palpation comment  TTP along the medial/ lateral joint line      Ambulation/Gait   Ambulation/Gait  Yes    Gait Pattern  Step-through pattern;Decreased stride length;Trendelenburg;Antalgic;Decreased stance time - left;Decreased step length - right                Objective measurements completed on examination: See above findings.      [facility] Adult PT Treatment/Exercise - 01/15/19 0001      Exercises   Exercises  Knee/Hip      Knee/Hip Exercises: Stretches   Passive Hamstring Stretch  2 reps;30 seconds      Knee/Hip Exercises: Supine   Quad Sets  1 set;10 reps   with tactile feedback in the popliteal space   Heel Slides  Strengthening;1 set;10 reps   with strap   Straight Leg Raises  1 set;10 reps      Modalities   Modalities  Vasopneumatic      Vasopneumatic   Number Minutes Vasopneumatic   10 minutes    Vasopnuematic Location   Knee    Vasopneumatic Pressure  Medium  Vasopneumatic Temperature   34             PT Education - 01/15/19 1636    Education Details  evaluation findings, POC, goals HEP with proper form/ rationale    Person(s) Educated  Patient    Methods  Explanation;Verbal cues;Handout    Comprehension  Verbalized understanding;Verbal cues required       PT Short Term Goals -  01/15/19 1728      PT SHORT TERM GOAL #1   Title  pt to be I with inital HEP    Time  4    Period  Weeks    Status  New    Target Date  02/12/19      PT SHORT TERM GOAL #2   Title  pt to verbalize / demo techniques to decrease knee pain and inflammation via RICE and HEP    Time  4    Period  Weeks    Status  New    Target Date  02/12/19      PT SHORT TERM GOAL #3   Title  reduce L kne edema grossly by ./= 1 - 2 cm to promote knee ROM    Time  8    Period  Weeks    Status  New    Target Date  02/12/19        PT Long Term Goals - 01/15/19 1731      PT LONG TERM GOAL #1   Title  increase L knee ROM >/= 2 - 115 degrees for functional ROM required for efficient gait and ADLs    Time  8    Period  Weeks    Status  New    Target Date  03/12/19      PT LONG TERM GOAL #2   Title  pt to be able to stand/ walk >/= 45 min and navigate 12 steps reciprocally with LRAD for functional mobility required for community ambulation    Time  8    Period  Weeks    Status  New    Target Date  03/12/19      PT LONG TERM GOAL #3   Title  increase L knee strength to >/=4+/5 to maximize knee/ hip stability with walking/ standing    Time  8    Period  Weeks    Status  New    Target Date  03/12/19      PT LONG TERM GOAL #4   Title  increase FOTO score to </=39% limited to demo improvement in function    Time  8    Period  Weeks    Status  New    Target Date  03/12/19      PT LONG TERM GOAL #5   Title  pt to be I with all HEP given as of last visit to maintain and progress current level of funciton.    Time  8    Period  Weeks    Status  New    Target Date  03/12/19             Plan - 01/15/19 1717    Clinical Impression Statement  pt presents to OPPT with s/p L TKA on 12/22/2018. She demonstrates limited knee AROM/ PROM secondary to knee stiffness and pain. Strength wasn't assessed due to irritability of the knee. She would benefit from physical therapy to decrease L knee  pain/ swelling, increase ROM and strength, and maximize her function  by addressing the deficits listed.    Personal Factors and Comorbidities  Age;Comorbidity 3+    Comorbidities  hx of Cx, depression, anxiety, fibromyalgia    Examination-Activity Limitations  Stand;Stairs;Locomotion Level    Stability/Clinical Decision Making  Evolving/Moderate complexity    Clinical Decision Making  Moderate    Rehab Potential  Good    PT Frequency  2x / week    PT Duration  8 weeks    PT Treatment/Interventions  ADLs/Self Care Home Management;Cryotherapy;Therapeutic activities;Therapeutic exercise;Balance training;Manual techniques;Passive range of motion;Taping;Vasopneumatic Device;Patient/family education    PT Next Visit Plan  (hx of Cx no E-stim or heat). update/ Review HEP, knee mobs/ ROM, bike vs nustep, LLE strengthening, vaso    PT Home Exercise Plan  heel slide with strap, hamstring stretch (supine and seated), SLR, quad set,    Consulted and Agree with Plan of Care  Patient       Patient will benefit from skilled therapeutic intervention in order to improve the following deficits and impairments:  Abnormal gait, Obesity, Pain, Postural dysfunction, Improper body mechanics, Decreased strength, Decreased range of motion, Decreased balance, Decreased activity tolerance, Decreased endurance, Increased edema  Visit Diagnosis: 1. Chronic pain of left knee   2. Muscle weakness (generalized)   3. Localized edema   4. Stiffness of left knee, not elsewhere classified        Problem List Patient Active Problem List   Diagnosis Date Noted  . Status post total left knee replacement 12/22/2018  . MDD (major depressive disorder), recurrent episode, mild ([place]) 10/29/2018  . Autoimmune hepatitis ([place]) 10/29/2018  . OSA (obstructive sleep apnea) 10/29/2018  . Renal cyst, right 10/22/2018  . Lung nodule, solitary 10/22/2018  . Genital herpes simplex 10/29/2017  . Vitamin D deficiency 10/29/2017  .  Class 2 obesity due to excess calories without serious comorbidity with body mass index (BMI) of 38.0 to 38.9 in adult 10/29/2017  . Essential hypertension 10/29/2017  . Pre-diabetes 02/14/2017  . Fibromyalgia 02/14/2017  . Substance abuse in remission ([place]) 02/14/2017  . Anxiety and depression 02/14/2017  . HX: breast cancer 02/14/2017  . Urge incontinence 11/13/2016  . Unilateral primary osteoarthritis, left knee 06/14/2016  . PMB (postmenopausal bleeding) 04/02/2016  . GERD (gastroesophageal reflux disease) 11/02/2015  . Angioedema 10/24/2013  . Tobacco abuse 10/24/2013  . DCIS (ductal carcinoma in situ) of breast 04/07/2013  . Depression 04/07/2013   Starr [place] PT, DPT, LAT, ATC  01/15/19  5:37 PM      [facility] [address] [place], [place], [address] Phone: [phone]   Fax:  [phone]  Name: Rachel Woodard MRN: [id number] Date of Birth: 07-24-1950

## 2019-01-26 ENCOUNTER — Ambulatory Visit: Payer: Medicare HMO | Attending: Physician Assistant | Admitting: Physical Therapy

## 2019-01-26 ENCOUNTER — Encounter: Payer: Self-pay | Admitting: Physical Therapy

## 2019-01-26 ENCOUNTER — Other Ambulatory Visit: Payer: Self-pay

## 2019-01-26 DIAGNOSIS — M6281 Muscle weakness (generalized): Secondary | ICD-10-CM

## 2019-01-26 DIAGNOSIS — G8929 Other chronic pain: Secondary | ICD-10-CM | POA: Insufficient documentation

## 2019-01-26 DIAGNOSIS — R600 Localized edema: Secondary | ICD-10-CM | POA: Insufficient documentation

## 2019-01-26 DIAGNOSIS — M25562 Pain in left knee: Secondary | ICD-10-CM | POA: Insufficient documentation

## 2019-01-26 DIAGNOSIS — M25662 Stiffness of left knee, not elsewhere classified: Secondary | ICD-10-CM | POA: Insufficient documentation

## 2019-01-26 NOTE — Therapy (Signed)
[place], [place], [address] Phone: [phone]   Fax:  [phone]  Physical Therapy Treatment  Patient Details  Name: Rachel Woodard MRN: [id number] Date of Birth: 12-06-1950 Referring Provider (PT): Aundra Dubin, [place]   Encounter Date: 01/26/2019  PT End of Session - 01/26/19 1023    Visit Number  2    Number of Visits  17    Date for PT Re-Evaluation  03/12/19    Authorization Type  MCR: Kx mod by 15th visit, progress note at 10th visit    PT Start Time  1020    PT Stop Time  1115    PT Time Calculation (min)  55 min       Past Medical History:  Diagnosis Date  . Anemia 2001   after gastric bypass  . Anxiety   . Arthritis   . Breast cancer ([place]) 2013   Breast cancer    Radiation therapy  . COPD (chronic obstructive pulmonary disease) ([place])   . Depression   . Fibromyalgia   . GERD (gastroesophageal reflux disease)   . Hypertension   . Personal history of radiation therapy 2013  . Pneumonia   . Pre-diabetes   . PTSD (post-traumatic stress disorder)   . Sleep apnea    uses CPAP    Past Surgical History:  Procedure Laterality Date  . ABDOMINAL HYSTERECTOMY  2018  . BREAST LUMPECTOMY Right 2013  . GASTRIC BYPASS  2001  . TOTAL KNEE ARTHROPLASTY Left 12/22/2018   Procedure: LEFT TOTAL KNEE ARTHROPLASTY;  Surgeon: Leandrew Koyanagi, MD;  Location: [facility];  Service: Orthopedics;  Laterality: Left;    There were no vitals filed for this visit.  Subjective Assessment - 01/26/19 1021    Subjective  I am not using as much. Exercises are helping. Just stiff.    Currently in Pain?  No/denies         [facility] PT Assessment - 01/26/19 0001      AROM   Left Knee Flexion  75      PROM   Left Knee Flexion  80   supine with strap                   [facility] Adult PT Treatment/Exercise - 01/26/19 0001      Knee/Hip Exercises: Aerobic   Recumbent Bike  ROM rocking forward and  back x 3 minutes     Nustep  ROM UE/LE x 5 min @ L3       Knee/Hip Exercises: Standing   SLS  20 sec 1 ue support       Knee/Hip Exercises: Seated   Long Arc Quad  20 reps    Heel Slides  20 reps    Other Seated Knee/Hip Exercises  seated, ankles crossed, assisted knee flexion.,       Knee/Hip Exercises: Supine   Quad Sets  1 set;20 reps   with tactile feedback in the popliteal space   Heel Slides  AROM;20 reps    Straight Leg Raises  20 reps    Straight Leg Raises Limitations  quad lag present                PT Short Term Goals - 01/15/19 1728      PT SHORT TERM GOAL #1   Title  pt to be I with inital HEP    Time  4    Period  Weeks  Status  New    Target Date  02/12/19      PT SHORT TERM GOAL #2   Title  pt to verbalize / demo techniques to decrease knee pain and inflammation via RICE and HEP    Time  4    Period  Weeks    Status  New    Target Date  02/12/19      PT SHORT TERM GOAL #3   Title  reduce L kne edema grossly by ./= 1 - 2 cm to promote knee ROM    Time  8    Period  Weeks    Status  New    Target Date  02/12/19        PT Long Term Goals - 01/15/19 1731      PT LONG TERM GOAL #1   Title  increase L knee ROM >/= 2 - 115 degrees for functional ROM required for efficient gait and ADLs    Time  8    Period  Weeks    Status  New    Target Date  03/12/19      PT LONG TERM GOAL #2   Title  pt to be able to stand/ walk >/= 45 min and navigate 12 steps reciprocally with LRAD for functional mobility required for community ambulation    Time  8    Period  Weeks    Status  New    Target Date  03/12/19      PT LONG TERM GOAL #3   Title  increase L knee strength to >/=4+/5 to maximize knee/ hip stability with walking/ standing    Time  8    Period  Weeks    Status  New    Target Date  03/12/19      PT LONG TERM GOAL #4   Title  increase FOTO score to </=39% limited to demo improvement in function    Time  8    Period  Weeks    Status   New    Target Date  03/12/19      PT LONG TERM GOAL #5   Title  pt to be I with all HEP given as of last visit to maintain and progress current level of funciton.    Time  8    Period  Weeks    Status  New    Target Date  03/12/19            Plan - 01/26/19 1211    Clinical Impression Statement  Pt reports less icing due to feeling better with exercises. ROM improved. Encouraged continued ice for now. Began Aerobic machines for ROM , she is unable to make full revolutions on bike.    PT Next Visit Plan  (hx of Cx no E-stim or heat). update/ Review HEP, knee mobs/ ROM, bike vs nustep, LLE strengthening, vaso    PT Home Exercise Plan  heel slide with strap, hamstring stretch (supine and seated), SLR, quad set,    Consulted and Agree with Plan of Care  Patient       Patient will benefit from skilled therapeutic intervention in order to improve the following deficits and impairments:  Abnormal gait, Obesity, Pain, Postural dysfunction, Improper body mechanics, Decreased strength, Decreased range of motion, Decreased balance, Decreased activity tolerance, Decreased endurance, Increased edema  Visit Diagnosis: 1. Chronic pain of left knee   2. Muscle weakness (generalized)   3. Localized edema   4. Stiffness  of left knee, not elsewhere classified        Problem List Patient Active Problem List   Diagnosis Date Noted  . Status post total left knee replacement 12/22/2018  . MDD (major depressive disorder), recurrent episode, mild ([place]) 10/29/2018  . Autoimmune hepatitis ([place]) 10/29/2018  . OSA (obstructive sleep apnea) 10/29/2018  . Renal cyst, right 10/22/2018  . Lung nodule, solitary 10/22/2018  . Genital herpes simplex 10/29/2017  . Vitamin D deficiency 10/29/2017  . Class 2 obesity due to excess calories without serious comorbidity with body mass index (BMI) of 38.0 to 38.9 in adult 10/29/2017  . Essential hypertension 10/29/2017  . Pre-diabetes 02/14/2017  .  Fibromyalgia 02/14/2017  . Substance abuse in remission ([place]) 02/14/2017  . Anxiety and depression 02/14/2017  . HX: breast cancer 02/14/2017  . Urge incontinence 11/13/2016  . Unilateral primary osteoarthritis, left knee 06/14/2016  . PMB (postmenopausal bleeding) 04/02/2016  . GERD (gastroesophageal reflux disease) 11/02/2015  . Angioedema 10/24/2013  . Tobacco abuse 10/24/2013  . DCIS (ductal carcinoma in situ) of breast 04/07/2013  . Depression 04/07/2013    Dorene Ar, PTA 01/26/2019, 12:18 PM  [facility] [address] [place], [place], [address] Phone: [phone]   Fax:  [phone]  Name: Emerson Barretto MRN: [id number] Date of Birth: 08/12/1950

## 2019-01-28 ENCOUNTER — Telehealth: Payer: Self-pay

## 2019-01-28 ENCOUNTER — Ambulatory Visit: Payer: Medicare HMO | Admitting: Physical Therapy

## 2019-01-28 ENCOUNTER — Other Ambulatory Visit: Payer: Self-pay

## 2019-01-28 DIAGNOSIS — M25562 Pain in left knee: Secondary | ICD-10-CM | POA: Diagnosis not present

## 2019-01-28 DIAGNOSIS — M6281 Muscle weakness (generalized): Secondary | ICD-10-CM | POA: Diagnosis not present

## 2019-01-28 DIAGNOSIS — G8929 Other chronic pain: Secondary | ICD-10-CM

## 2019-01-28 DIAGNOSIS — R600 Localized edema: Secondary | ICD-10-CM | POA: Diagnosis not present

## 2019-01-28 DIAGNOSIS — M25662 Stiffness of left knee, not elsewhere classified: Secondary | ICD-10-CM

## 2019-01-28 NOTE — Therapy (Signed)
[place], [place], [address] Phone: [phone]   Fax:  [phone]  Physical Therapy Treatment  Patient Details  Name: Rachel Woodard MRN: [id number] Date of Birth: 10/01/1950 Referring Provider (PT): Aundra Dubin, [place]   Encounter Date: 01/28/2019  PT End of Session - 01/28/19 1322    Visit Number  3    Number of Visits  17    Date for PT Re-Evaluation  03/12/19    Authorization Type  MCR: Kx mod by 15th visit, progress note at 10th visit    PT Start Time  1317    PT Stop Time  1406    PT Time Calculation (min)  49 min    Activity Tolerance  Patient tolerated treatment well    Behavior During Therapy  [facility] for tasks assessed/performed       Past Medical History:  Diagnosis Date  . Anemia 2001   after gastric bypass  . Anxiety   . Arthritis   . Breast cancer ([place]) 2013   Breast cancer    Radiation therapy  . COPD (chronic obstructive pulmonary disease) ([place])   . Depression   . Fibromyalgia   . GERD (gastroesophageal reflux disease)   . Hypertension   . Personal history of radiation therapy 2013  . Pneumonia   . Pre-diabetes   . PTSD (post-traumatic stress disorder)   . Sleep apnea    uses CPAP    Past Surgical History:  Procedure Laterality Date  . ABDOMINAL HYSTERECTOMY  2018  . BREAST LUMPECTOMY Right 2013  . GASTRIC BYPASS  2001  . TOTAL KNEE ARTHROPLASTY Left 12/22/2018   Procedure: LEFT TOTAL KNEE ARTHROPLASTY;  Surgeon: Leandrew Koyanagi, MD;  Location: [facility];  Service: Orthopedics;  Laterality: Left;    There were no vitals filed for this visit.  Subjective Assessment - 01/28/19 1322    Subjective  " I was doing pretty good the other day, today I am just stiff and alittle more sore at like a 5/10"    Patient Stated Goals  to lose weight, to be best self, return to walking,    Currently in Pain?  Yes    Pain Score  5     Pain Location  Knee    Pain Orientation  Left     Pain Type  Surgical pain    Aggravating Factors   sitting still, deep bending, stairs    Pain Relieving Factors  moving around, exercise.         [facility] PT Assessment - 01/28/19 0001      AROM   Left Knee Extension  14    Left Knee Flexion  79                   [facility] Adult PT Treatment/Exercise - 01/28/19 0001      Knee/Hip Exercises: Stretches   Passive Hamstring Stretch  2 reps;30 seconds    Quad Stretch  2 reps;Left;30 seconds      Knee/Hip Exercises: Aerobic   Recumbent Bike  6 min, rocking forward/ backward      Knee/Hip Exercises: Seated   Sit to Sand  10 reps   x 2, with emphasis on nose over toes     Knee/Hip Exercises: Supine   Straight Leg Raises  10 reps   combined with sustained quad set     Vasopneumatic   Number Minutes Vasopneumatic   10 minutes    Vasopnuematic Location  Knee    Vasopneumatic Pressure  Medium    Vasopneumatic Temperature   34      Manual Therapy   Manual Therapy  Joint mobilization;Passive ROM    Joint Mobilization  AP grade III with pt actively bending knee between sessions    Passive ROM  forced knee flexion with towel in popliteal space             PT Education - 01/28/19 1355    Education Details  reviewd previously provided HEP, and updated today. To really work on bending the knees to increase ROM       PT Short Term Goals - 01/15/19 1728      PT SHORT TERM GOAL #1   Title  pt to be I with inital HEP    Time  4    Period  Weeks    Status  New    Target Date  02/12/19      PT SHORT TERM GOAL #2   Title  pt to verbalize / demo techniques to decrease knee pain and inflammation via RICE and HEP    Time  4    Period  Weeks    Status  New    Target Date  02/12/19      PT SHORT TERM GOAL #3   Title  reduce L kne edema grossly by ./= 1 - 2 cm to promote knee ROM    Time  8    Period  Weeks    Status  New    Target Date  02/12/19        PT Long Term Goals - 01/15/19 1731      PT LONG TERM  GOAL #1   Title  increase L knee ROM >/= 2 - 115 degrees for functional ROM required for efficient gait and ADLs    Time  8    Period  Weeks    Status  New    Target Date  03/12/19      PT LONG TERM GOAL #2   Title  pt to be able to stand/ walk >/= 45 min and navigate 12 steps reciprocally with LRAD for functional mobility required for community ambulation    Time  8    Period  Weeks    Status  New    Target Date  03/12/19      PT LONG TERM GOAL #3   Title  increase L knee strength to >/=4+/5 to maximize knee/ hip stability with walking/ standing    Time  8    Period  Weeks    Status  New    Target Date  03/12/19      PT LONG TERM GOAL #4   Title  increase FOTO score to </=39% limited to demo improvement in function    Time  8    Period  Weeks    Status  New    Target Date  03/12/19      PT LONG TERM GOAL #5   Title  pt to be I with all HEP given as of last visit to maintain and progress current level of funciton.    Time  8    Period  Weeks    Status  New    Target Date  03/12/19            Plan - 01/28/19 1356    Clinical Impression Statement  pt noted increased stiffness today and pain tat was at 5/10. Focused  on ROM with emphasis on flxion which she is gradually increasing today at 79 degrees. progress HEP today and discussed working on knee flexion at home. continued vaso for pain and swelling.    PT Treatment/Interventions  ADLs/Self Care Home Management;Cryotherapy;Therapeutic activities;Therapeutic exercise;Balance training;Manual techniques;Passive range of motion;Taping;Vasopneumatic Device;Patient/family education    PT Next Visit Plan  (hx of Cx no E-stim or heat). update/ Review HEP, knee mobs/ ROM, bike vs nustep, LLE strengthening, vaso    PT Home Exercise Plan  heel slide with strap, hamstring stretch (supine and seated), SLR, quad set, sit to stand    Consulted and Agree with Plan of Care  Patient       Patient will benefit from skilled therapeutic  intervention in order to improve the following deficits and impairments:  Abnormal gait, Obesity, Pain, Postural dysfunction, Improper body mechanics, Decreased strength, Decreased range of motion, Decreased balance, Decreased activity tolerance, Decreased endurance, Increased edema  Visit Diagnosis: 1. Chronic pain of left knee   2. Muscle weakness (generalized)   3. Localized edema   4. Stiffness of left knee, not elsewhere classified        Problem List Patient Active Problem List   Diagnosis Date Noted  . Status post total left knee replacement 12/22/2018  . MDD (major depressive disorder), recurrent episode, mild ([place]) 10/29/2018  . Autoimmune hepatitis ([place]) 10/29/2018  . OSA (obstructive sleep apnea) 10/29/2018  . Renal cyst, right 10/22/2018  . Lung nodule, solitary 10/22/2018  . Genital herpes simplex 10/29/2017  . Vitamin D deficiency 10/29/2017  . Class 2 obesity due to excess calories without serious comorbidity with body mass index (BMI) of 38.0 to 38.9 in adult 10/29/2017  . Essential hypertension 10/29/2017  . Pre-diabetes 02/14/2017  . Fibromyalgia 02/14/2017  . Substance abuse in remission ([place]) 02/14/2017  . Anxiety and depression 02/14/2017  . HX: breast cancer 02/14/2017  . Urge incontinence 11/13/2016  . Unilateral primary osteoarthritis, left knee 06/14/2016  . PMB (postmenopausal bleeding) 04/02/2016  . GERD (gastroesophageal reflux disease) 11/02/2015  . Angioedema 10/24/2013  . Tobacco abuse 10/24/2013  . DCIS (ductal carcinoma in situ) of breast 04/07/2013  . Depression 04/07/2013   Starr Lake PT, DPT, LAT, ATC  01/28/19  2:00 PM      [facility] [address] [place], [place], [address] Phone: [phone]   Fax:  [phone]  Name: Daniele Dillow MRN: [id number] Date of Birth: 07-31-1950

## 2019-01-28 NOTE — Telephone Encounter (Signed)
Patient called stating that she needed copy of FL2 form I have printed this and put up front.

## 2019-02-02 ENCOUNTER — Encounter: Payer: Self-pay | Admitting: Physical Therapy

## 2019-02-02 ENCOUNTER — Ambulatory Visit: Payer: Medicare HMO | Admitting: Physical Therapy

## 2019-02-02 ENCOUNTER — Other Ambulatory Visit: Payer: Self-pay

## 2019-02-02 DIAGNOSIS — M25562 Pain in left knee: Secondary | ICD-10-CM | POA: Diagnosis not present

## 2019-02-02 DIAGNOSIS — G8929 Other chronic pain: Secondary | ICD-10-CM

## 2019-02-02 DIAGNOSIS — M6281 Muscle weakness (generalized): Secondary | ICD-10-CM

## 2019-02-02 DIAGNOSIS — R600 Localized edema: Secondary | ICD-10-CM | POA: Diagnosis not present

## 2019-02-02 DIAGNOSIS — G4733 Obstructive sleep apnea (adult) (pediatric): Secondary | ICD-10-CM | POA: Diagnosis not present

## 2019-02-02 DIAGNOSIS — M25662 Stiffness of left knee, not elsewhere classified: Secondary | ICD-10-CM

## 2019-02-02 NOTE — Therapy (Signed)
[place], [place], [address] Phone: [phone]   Fax:  [phone]  Physical Therapy Treatment  Patient Details  Name: Rachel Woodard MRN: [id number] Date of Birth: [birth year] Referring Provider (PT): Aundra Dubin, [place]   Encounter Date: 02/02/2019  PT End of Session - 02/02/19 1624    Visit Number  4    Number of Visits  17    Date for PT Re-Evaluation  03/12/19    Authorization Type  MCR: Kx mod by 15th visit, progress note at 10th visit    PT Start Time  1543    PT Stop Time  1623    PT Time Calculation (min)  40 min    Activity Tolerance  Patient tolerated treatment well    Behavior During Therapy  [facility] for tasks assessed/performed       Past Medical History:  Diagnosis Date  . Anemia 2001   after gastric bypass  . Anxiety   . Arthritis   . Breast cancer ([place]) 2013   Breast cancer    Radiation therapy  . COPD (chronic obstructive pulmonary disease) ([place])   . Depression   . Fibromyalgia   . GERD (gastroesophageal reflux disease)   . Hypertension   . Personal history of radiation therapy 2013  . Pneumonia   . Pre-diabetes   . PTSD (post-traumatic stress disorder)   . Sleep apnea    uses CPAP    Past Surgical History:  Procedure Laterality Date  . ABDOMINAL HYSTERECTOMY  2018  . BREAST LUMPECTOMY Right 2013  . GASTRIC BYPASS  2001  . TOTAL KNEE ARTHROPLASTY Left 12/22/2018   Procedure: LEFT TOTAL KNEE ARTHROPLASTY;  Surgeon: Leandrew Koyanagi, MD;  Location: [facility];  Service: Orthopedics;  Laterality: Left;    There were no vitals filed for this visit.  Subjective Assessment - 02/02/19 1551    Subjective  " last night was the first night I slept allthe way through, I am having pain at 4/10 today"    Patient Stated Goals  to lose weight, to be best self, return to walking,    Currently in Pain?  Yes    Pain Score  4     Pain Onset  More than a month ago         [facility] PT  Assessment - 02/02/19 0001      Assessment   Medical Diagnosis  L TKA    Referring Provider (PT)  Aundra Dubin, PA-C    Onset Date/Surgical Date  12/22/18      AROM   Left Knee Extension  9    Left Knee Flexion  88   following manual/ stretching     PROM   Left Knee Extension  7    Left Knee Flexion  92                   [facility] Adult PT Treatment/Exercise - 02/02/19 0001      Knee/Hip Exercises: Stretches   Passive Hamstring Stretch  2 reps;30 seconds    Quad Stretch  3 reps;30 seconds   PNF contraction/ relax     Knee/Hip Exercises: Aerobic   Recumbent Bike  6 min, rocking forward/ backward   unable to get full revolutions     Knee/Hip Exercises: Seated   Heel Slides  1 set;10 reps    Hamstring Curl  Strengthening;2 sets;15 reps   with red theraband     Knee/Hip Exercises:  Supine   Bridges  2 sets;10 reps   increasing knee flexion between sets     Manual Therapy   Manual therapy comments  massage roller along the quadricep    Joint Mobilization  AP grade III with pt actively bending knee between sets, patellar mobs in all planes grade III,    Passive ROM  seated PROM L knee flexion, with active RE                PT Short Term Goals - 01/15/19 1728      PT SHORT TERM GOAL #1   Title  pt to be I with inital HEP    Time  4    Period  Weeks    Status  New    Target Date  02/12/19      PT SHORT TERM GOAL #2   Title  pt to verbalize / demo techniques to decrease knee pain and inflammation via RICE and HEP    Time  4    Period  Weeks    Status  New    Target Date  02/12/19      PT SHORT TERM GOAL #3   Title  reduce L kne edema grossly by ./= 1 - 2 cm to promote knee ROM    Time  8    Period  Weeks    Status  New    Target Date  02/12/19        PT Long Term Goals - 01/15/19 1731      PT LONG TERM GOAL #1   Title  increase L knee ROM >/= 2 - 115 degrees for functional ROM required for efficient gait and ADLs    Time  8     Period  Weeks    Status  New    Target Date  03/12/19      PT LONG TERM GOAL #2   Title  pt to be able to stand/ walk >/= 45 min and navigate 12 steps reciprocally with LRAD for functional mobility required for community ambulation    Time  8    Period  Weeks    Status  New    Target Date  03/12/19      PT LONG TERM GOAL #3   Title  increase L knee strength to >/=4+/5 to maximize knee/ hip stability with walking/ standing    Time  8    Period  Weeks    Status  New    Target Date  03/12/19      PT LONG TERM GOAL #4   Title  increase FOTO score to </=39% limited to demo improvement in function    Time  8    Period  Weeks    Status  New    Target Date  03/12/19      PT LONG TERM GOAL #5   Title  pt to be I with all HEP given as of last visit to maintain and progress current level of funciton.    Time  8    Period  Weeks    Status  New    Target Date  03/12/19            Plan - 02/02/19 1613    Clinical Impression Statement  pt is making progress with knee ROM increasing 9 - 88 degrees. she continues to report pain and stiffness inthe knee limited ROM. She notes being consistent with her HEP. continued emphasis on knee flexion  which she is making progress. plan to continue with current POC to work on ROM and knee strengthening.       Patient will benefit from skilled therapeutic intervention in order to improve the following deficits and impairments:  Abnormal gait, Obesity, Pain, Postural dysfunction, Improper body mechanics, Decreased strength, Decreased range of motion, Decreased balance, Decreased activity tolerance, Decreased endurance, Increased edema  Visit Diagnosis: 1. Chronic pain of left knee   2. Muscle weakness (generalized)   3. Localized edema   4. Stiffness of left knee, not elsewhere classified        Problem List Patient Active Problem List   Diagnosis Date Noted  . Status post total left knee replacement 12/22/2018  . MDD (major depressive  disorder), recurrent episode, mild ([place]) 10/29/2018  . Autoimmune hepatitis ([place]) 10/29/2018  . OSA (obstructive sleep apnea) 10/29/2018  . Renal cyst, right 10/22/2018  . Lung nodule, solitary 10/22/2018  . Genital herpes simplex 10/29/2017  . Vitamin D deficiency 10/29/2017  . Class 2 obesity due to excess calories without serious comorbidity with body mass index (BMI) of 38.0 to 38.9 in adult 10/29/2017  . Essential hypertension 10/29/2017  . Pre-diabetes 02/14/2017  . Fibromyalgia 02/14/2017  . Substance abuse in remission ([place]) 02/14/2017  . Anxiety and depression 02/14/2017  . HX: breast cancer 02/14/2017  . Urge incontinence 11/13/2016  . Unilateral primary osteoarthritis, left knee 06/14/2016  . PMB (postmenopausal bleeding) 04/02/2016  . GERD (gastroesophageal reflux disease) 11/02/2015  . Angioedema 10/24/2013  . Tobacco abuse 10/24/2013  . DCIS (ductal carcinoma in situ) of breast 04/07/2013  . Depression 04/07/2013   Starr Lake PT, DPT, LAT, ATC  02/02/19  4:25 PM      [facility] [address] [place], [place], [address] Phone: [phone]   Fax:  [phone]  Name: Rachel Woodard MRN: [id number] Date of Birth: 09-Dec-1950

## 2019-02-03 ENCOUNTER — Encounter: Payer: Self-pay | Admitting: Orthopaedic Surgery

## 2019-02-03 ENCOUNTER — Ambulatory Visit (INDEPENDENT_AMBULATORY_CARE_PROVIDER_SITE_OTHER): Payer: Medicare HMO | Admitting: Orthopaedic Surgery

## 2019-02-03 ENCOUNTER — Ambulatory Visit (INDEPENDENT_AMBULATORY_CARE_PROVIDER_SITE_OTHER): Payer: Medicare HMO

## 2019-02-03 DIAGNOSIS — Z96652 Presence of left artificial knee joint: Secondary | ICD-10-CM | POA: Diagnosis not present

## 2019-02-03 NOTE — Progress Notes (Signed)
Post-Op Visit Note   Patient: Rachel Woodard           Date of Birth: 10-22-1950           MRN: [id number] Visit Date: 02/03/2019 PCP: Rachel Pier, MD   Assessment & Plan:  Chief Complaint:  Chief Complaint  Patient presents with  . Left Knee - Routine Post Op   Visit Diagnoses:  1. S/P TKR (total knee replacement), left     Plan: Rachel Woodard is 6 weeks status post left total knee replacement.  She is doing well overall.  She is very happy with her recovery and progress.  She is been attending outpatient PT twice a week.  She can get to 92 degrees of flexion.  She making good progress.  She is ambulating with a cane.  She has no real complaints today.  6 week TKA follow up plan  Patient presents for follow up 6 weeks status post left total knee replacement. The wound is healed and there is no evidence of infection. TED hose may be discontinued. Radiographs reveal a total knee arthroplasty in good position, with no evidence of subsidence, loosening, or complicating features. Patient should refrain from any dental procedures, colonoscopies, etc until 3 months postop.  Reminders were given about signs to be aware of including redness, drainage, increased pain, fevers, calf pain, shortness of breath, or any concern should generate a phone call or a return to see [place] immediately. Will plan to follow up at 3 months postoperatively for next evaluation.  Follow-Up Instructions: Return in about 6 weeks (around 03/17/2019).   Orders:  Orders Placed This Encounter  Procedures  . XR Knee 1-2 Views Left   No orders of the defined types were placed in this encounter.   Imaging: Xr Knee 1-2 Views Left  Result Date: 02/03/2019 Stable total knee replacement in good alignment.    PMFS History: Patient Active Problem List   Diagnosis Date Noted  . Status post total left knee replacement 12/22/2018  . MDD (major depressive disorder), recurrent episode, mild ([place]) 10/29/2018  .  Autoimmune hepatitis ([place]) 10/29/2018  . OSA (obstructive sleep apnea) 10/29/2018  . Renal cyst, right 10/22/2018  . Lung nodule, solitary 10/22/2018  . Genital herpes simplex 10/29/2017  . Vitamin D deficiency 10/29/2017  . Class 2 obesity due to excess calories without serious comorbidity with body mass index (BMI) of 38.0 to 38.9 in adult 10/29/2017  . Essential hypertension 10/29/2017  . Pre-diabetes 02/14/2017  . Fibromyalgia 02/14/2017  . Substance abuse in remission ([place]) 02/14/2017  . Anxiety and depression 02/14/2017  . HX: breast cancer 02/14/2017  . Urge incontinence 11/13/2016  . Unilateral primary osteoarthritis, left knee 06/14/2016  . PMB (postmenopausal bleeding) 04/02/2016  . GERD (gastroesophageal reflux disease) 11/02/2015  . Angioedema 10/24/2013  . Tobacco abuse 10/24/2013  . DCIS (ductal carcinoma in situ) of breast 04/07/2013  . Depression 04/07/2013   Past Medical History:  Diagnosis Date  . Anemia 2001   after gastric bypass  . Anxiety   . Arthritis   . Breast cancer ([place]) 2013   Breast cancer    Radiation therapy  . COPD (chronic obstructive pulmonary disease) ([place])   . Depression   . Fibromyalgia   . GERD (gastroesophageal reflux disease)   . Hypertension   . Personal history of radiation therapy 2013  . Pneumonia   . Pre-diabetes   . PTSD (post-traumatic stress disorder)   . Sleep apnea    uses CPAP  No family history on file.  Past Surgical History:  Procedure Laterality Date  . ABDOMINAL HYSTERECTOMY  2018  . BREAST LUMPECTOMY Right 2013  . GASTRIC BYPASS  2001  . TOTAL KNEE ARTHROPLASTY Left 12/22/2018   Procedure: LEFT TOTAL KNEE ARTHROPLASTY;  Surgeon: Rachel Koyanagi, MD;  Location: [facility];  Service: Orthopedics;  Laterality: Left;   Social History   Occupational History  . Not on file  Tobacco Use  . Smoking status: Current Every Day Smoker    Packs/day: 0.50    Types: Cigarettes  . Smokeless tobacco: Never Used  Substance  and Sexual Activity  . Alcohol use: Yes    Comment: seldom  . Drug use: No    Types: Cocaine    Comment: clean x 60 days on 11/28/15  . Sexual activity: Yes

## 2019-02-04 ENCOUNTER — Ambulatory Visit: Payer: Medicare HMO | Admitting: Physical Therapy

## 2019-02-04 ENCOUNTER — Other Ambulatory Visit: Payer: Self-pay

## 2019-02-04 ENCOUNTER — Telehealth: Payer: Self-pay | Admitting: Internal Medicine

## 2019-02-04 DIAGNOSIS — M25562 Pain in left knee: Secondary | ICD-10-CM | POA: Diagnosis not present

## 2019-02-04 DIAGNOSIS — M25662 Stiffness of left knee, not elsewhere classified: Secondary | ICD-10-CM

## 2019-02-04 DIAGNOSIS — G8929 Other chronic pain: Secondary | ICD-10-CM | POA: Diagnosis not present

## 2019-02-04 DIAGNOSIS — R600 Localized edema: Secondary | ICD-10-CM | POA: Diagnosis not present

## 2019-02-04 DIAGNOSIS — M6281 Muscle weakness (generalized): Secondary | ICD-10-CM | POA: Diagnosis not present

## 2019-02-04 NOTE — Therapy (Signed)
[place], [place], [address] Phone: [phone]   Fax:  [phone]  Physical Therapy Treatment  Patient Details  Name: Rachel Woodard MRN: [id number] Date of Birth: 1950/12/15 Referring Provider (PT): Aundra Dubin, [place]   Encounter Date: 02/04/2019  PT End of Session - 02/04/19 1027    Visit Number  5    Number of Visits  17    Date for PT Re-Evaluation  03/12/19    Authorization Type  MCR: Kx mod by 15th visit, progress note at 10th visit    PT Start Time  1027   pt arrived 12 min late today   PT Stop Time  1102    PT Time Calculation (min)  35 min    Activity Tolerance  Patient tolerated treatment well    Behavior During Therapy  [facility] for tasks assessed/performed       Past Medical History:  Diagnosis Date  . Anemia 2001   after gastric bypass  . Anxiety   . Arthritis   . Breast cancer ([place]) 2013   Breast cancer    Radiation therapy  . COPD (chronic obstructive pulmonary disease) ([place])   . Depression   . Fibromyalgia   . GERD (gastroesophageal reflux disease)   . Hypertension   . Personal history of radiation therapy 2013  . Pneumonia   . Pre-diabetes   . PTSD (post-traumatic stress disorder)   . Sleep apnea    uses CPAP    Past Surgical History:  Procedure Laterality Date  . ABDOMINAL HYSTERECTOMY  2018  . BREAST LUMPECTOMY Right 2013  . GASTRIC BYPASS  2001  . TOTAL KNEE ARTHROPLASTY Left 12/22/2018   Procedure: LEFT TOTAL KNEE ARTHROPLASTY;  Surgeon: Leandrew Koyanagi, MD;  Location: [place];  Service: Orthopedics;  Laterality: Left;    There were no vitals filed for this visit.  Subjective Assessment - 02/04/19 1027    Subjective  " I saw the Md and he was happy with my progress. I was alittle more stiff/ sore last night and I don't really know why"    Patient Stated Goals  to lose weight, to be best self, return to walking,    Currently in Pain?  Yes    Pain Score  2      Pain Orientation  Left    Pain Descriptors / Indicators  Aching    Pain Type  Surgical pain    Pain Onset  More than a month ago    Pain Frequency  Intermittent    Aggravating Factors   deep bending         [facility] PT Assessment - 02/04/19 0001      AROM   Left Knee Flexion  91                   [facility] Adult PT Treatment/Exercise - 02/04/19 0001      Knee/Hip Exercises: Stretches   Passive Hamstring Stretch  2 reps;30 seconds    Quad Stretch  2 reps;30 seconds      Knee/Hip Exercises: Aerobic   Recumbent Bike  5 min 1/2 revolutions      Knee/Hip Exercises: Seated   Long Arc Quad  2 sets;10 reps;Strengthening;Left   4#   Hamstring Curl  Strengthening;2 sets;15 reps      Knee/Hip Exercises: Supine   Quad Sets  1 set;10 reps   with manual overpressure   Straight Leg Raises  1 set;15 reps  with sustained quad set     Vasopneumatic   Number Minutes Vasopneumatic   10 minutes    Vasopnuematic Location   Knee    Vasopneumatic Pressure  Medium    Vasopneumatic Temperature   34      Manual Therapy   Joint Mobilization  PA/AP grade III tibiofemoral mobs     Passive ROM  supine knee flexion working into end rang combined with hamstring activatoin               PT Short Term Goals - 01/15/19 1728      PT SHORT TERM GOAL #1   Title  pt to be I with inital HEP    Time  4    Period  Weeks    Status  New    Target Date  02/12/19      PT SHORT TERM GOAL #2   Title  pt to verbalize / demo techniques to decrease knee pain and inflammation via RICE and HEP    Time  4    Period  Weeks    Status  New    Target Date  02/12/19      PT SHORT TERM GOAL #3   Title  reduce L kne edema grossly by ./= 1 - 2 cm to promote knee ROM    Time  8    Period  Weeks    Status  New    Target Date  02/12/19        PT Long Term Goals - 01/15/19 1731      PT LONG TERM GOAL #1   Title  increase L knee ROM >/= 2 - 115 degrees for functional ROM required for  efficient gait and ADLs    Time  8    Period  Weeks    Status  New    Target Date  03/12/19      PT LONG TERM GOAL #2   Title  pt to be able to stand/ walk >/= 45 min and navigate 12 steps reciprocally with LRAD for functional mobility required for community ambulation    Time  8    Period  Weeks    Status  New    Target Date  03/12/19      PT LONG TERM GOAL #3   Title  increase L knee strength to >/=4+/5 to maximize knee/ hip stability with walking/ standing    Time  8    Period  Weeks    Status  New    Target Date  03/12/19      PT LONG TERM GOAL #4   Title  increase FOTO score to </=39% limited to demo improvement in function    Time  8    Period  Weeks    Status  New    Target Date  03/12/19      PT LONG TERM GOAL #5   Title  pt to be I with all HEP given as of last visit to maintain and progress current level of funciton.    Time  8    Period  Weeks    Status  New    Target Date  03/12/19            Plan - 02/04/19 1054    Clinical Impression Statement  pt arrived 12 min late today reporting increaed soreness since the last session. she saw the MD reporting that he is pleased with her progress. She is making progress  on ROM and continued working on strengthening. Vaso end of session to calm down soreness.    PT Next Visit Plan  (hx of Cx no E-stim or heat). update/ Review HEP, knee mobs/ ROM, bike vs nustep, LLE strengthening, vaso    PT Home Exercise Plan  heel slide with strap, hamstring stretch (supine and seated), SLR, quad set, sit to stand    Consulted and Agree with Plan of Care  Patient       Patient will benefit from skilled therapeutic intervention in order to improve the following deficits and impairments:     Visit Diagnosis: 1. Chronic pain of left knee   2. Muscle weakness (generalized)   3. Stiffness of left knee, not elsewhere classified   4. Localized edema        Problem List Patient Active Problem List   Diagnosis Date Noted  .  Status post total left knee replacement 12/22/2018  . MDD (major depressive disorder), recurrent episode, mild ([place]) 10/29/2018  . Autoimmune hepatitis ([place]) 10/29/2018  . OSA (obstructive sleep apnea) 10/29/2018  . Renal cyst, right 10/22/2018  . Lung nodule, solitary 10/22/2018  . Genital herpes simplex 10/29/2017  . Vitamin D deficiency 10/29/2017  . Class 2 obesity due to excess calories without serious comorbidity with body mass index (BMI) of 38.0 to 38.9 in adult 10/29/2017  . Essential hypertension 10/29/2017  . Pre-diabetes 02/14/2017  . Fibromyalgia 02/14/2017  . Substance abuse in remission ([place]) 02/14/2017  . Anxiety and depression 02/14/2017  . HX: breast cancer 02/14/2017  . Urge incontinence 11/13/2016  . Unilateral primary osteoarthritis, left knee 06/14/2016  . PMB (postmenopausal bleeding) 04/02/2016  . GERD (gastroesophageal reflux disease) 11/02/2015  . Angioedema 10/24/2013  . Tobacco abuse 10/24/2013  . DCIS (ductal carcinoma in situ) of breast 04/07/2013  . Depression 04/07/2013   Starr Lake PT, DPT, LAT, ATC  02/04/19  10:56 AM      [facility] [address] [place], [place], [address] Phone: [phone]   Fax:  [phone]  Name: Rachel Woodard MRN: [id number] Date of Birth: 03/13/1951

## 2019-02-04 NOTE — Telephone Encounter (Signed)
Rachel Woodard with [employer] called to verify if a fax has been received for the patient in regards to recommendation for astatin for primary prevention. Please follow up.    States they will be faxing form over again

## 2019-02-09 ENCOUNTER — Ambulatory Visit: Payer: Medicare HMO | Admitting: Physical Therapy

## 2019-02-10 DIAGNOSIS — I10 Essential (primary) hypertension: Secondary | ICD-10-CM | POA: Diagnosis not present

## 2019-02-10 DIAGNOSIS — K219 Gastro-esophageal reflux disease without esophagitis: Secondary | ICD-10-CM | POA: Diagnosis not present

## 2019-02-10 DIAGNOSIS — Z6836 Body mass index (BMI) 36.0-36.9, adult: Secondary | ICD-10-CM | POA: Diagnosis not present

## 2019-02-10 DIAGNOSIS — F319 Bipolar disorder, unspecified: Secondary | ICD-10-CM | POA: Diagnosis not present

## 2019-02-10 DIAGNOSIS — Z0000 Encounter for general adult medical examination without abnormal findings: Secondary | ICD-10-CM | POA: Diagnosis not present

## 2019-02-10 DIAGNOSIS — Z008 Encounter for other general examination: Secondary | ICD-10-CM | POA: Diagnosis not present

## 2019-02-10 DIAGNOSIS — T7840XA Allergy, unspecified, initial encounter: Secondary | ICD-10-CM | POA: Diagnosis not present

## 2019-02-10 DIAGNOSIS — Z7984 Long term (current) use of oral hypoglycemic drugs: Secondary | ICD-10-CM | POA: Diagnosis not present

## 2019-02-11 ENCOUNTER — Ambulatory Visit: Payer: Medicare HMO | Admitting: Physical Therapy

## 2019-02-13 ENCOUNTER — Other Ambulatory Visit: Payer: Self-pay | Admitting: Internal Medicine

## 2019-02-13 DIAGNOSIS — R7303 Prediabetes: Secondary | ICD-10-CM

## 2019-02-16 ENCOUNTER — Ambulatory Visit: Payer: Medicare HMO | Admitting: Physical Therapy

## 2019-02-16 ENCOUNTER — Telehealth: Payer: Self-pay | Admitting: Physical Therapy

## 2019-02-16 DIAGNOSIS — K219 Gastro-esophageal reflux disease without esophagitis: Secondary | ICD-10-CM | POA: Diagnosis not present

## 2019-02-16 DIAGNOSIS — R194 Change in bowel habit: Secondary | ICD-10-CM | POA: Diagnosis not present

## 2019-02-16 DIAGNOSIS — D219 Benign neoplasm of connective and other soft tissue, unspecified: Secondary | ICD-10-CM | POA: Diagnosis not present

## 2019-02-16 DIAGNOSIS — R899 Unspecified abnormal finding in specimens from other organs, systems and tissues: Secondary | ICD-10-CM | POA: Diagnosis not present

## 2019-02-16 DIAGNOSIS — Z9884 Bariatric surgery status: Secondary | ICD-10-CM | POA: Diagnosis not present

## 2019-02-16 NOTE — Telephone Encounter (Signed)
Pt reports she was out of town during her appointment this morning. She does plan to attend her next scheduled appointment.

## 2019-02-18 ENCOUNTER — Ambulatory Visit: Payer: Medicare HMO | Admitting: Physical Therapy

## 2019-02-18 ENCOUNTER — Encounter: Payer: Self-pay | Admitting: Physical Therapy

## 2019-02-18 ENCOUNTER — Other Ambulatory Visit: Payer: Self-pay

## 2019-02-18 DIAGNOSIS — G8929 Other chronic pain: Secondary | ICD-10-CM | POA: Diagnosis not present

## 2019-02-18 DIAGNOSIS — M25662 Stiffness of left knee, not elsewhere classified: Secondary | ICD-10-CM

## 2019-02-18 DIAGNOSIS — M25562 Pain in left knee: Secondary | ICD-10-CM

## 2019-02-18 DIAGNOSIS — R600 Localized edema: Secondary | ICD-10-CM | POA: Diagnosis not present

## 2019-02-18 DIAGNOSIS — M6281 Muscle weakness (generalized): Secondary | ICD-10-CM

## 2019-02-18 NOTE — Therapy (Signed)
[place], [place], [address] Phone: [phone]   Fax:  [phone]  Physical Therapy Treatment  Patient Details  Name: Rachel Woodard MRN: [id number] Date of Birth: May 24, 1951 Referring Provider (PT): Aundra Dubin, [place]   Encounter Date: 02/18/2019  PT End of Session - 02/18/19 1058    Visit Number  6    Number of Visits  17    Date for PT Re-Evaluation  03/12/19    Authorization Type  MCR: Kx mod by 15th visit, progress note at 10th visit    PT Start Time  1058    PT Stop Time  1148    PT Time Calculation (min)  50 min    Activity Tolerance  Patient tolerated treatment well    Behavior During Therapy  [facility] for tasks assessed/performed       Past Medical History:  Diagnosis Date  . Anemia 2001   after gastric bypass  . Anxiety   . Arthritis   . Breast cancer ([place]) 2013   Breast cancer    Radiation therapy  . COPD (chronic obstructive pulmonary disease) ([place])   . Depression   . Fibromyalgia   . GERD (gastroesophageal reflux disease)   . Hypertension   . Personal history of radiation therapy 2013  . Pneumonia   . Pre-diabetes   . PTSD (post-traumatic stress disorder)   . Sleep apnea    uses CPAP    Past Surgical History:  Procedure Laterality Date  . ABDOMINAL HYSTERECTOMY  2018  . BREAST LUMPECTOMY Right 2013  . GASTRIC BYPASS  2001  . TOTAL KNEE ARTHROPLASTY Left 12/22/2018   Procedure: LEFT TOTAL KNEE ARTHROPLASTY;  Surgeon: Leandrew Koyanagi, MD;  Location: [place];  Service: Orthopedics;  Laterality: Left;    There were no vitals filed for this visit.  Subjective Assessment - 02/18/19 1058    Subjective  "No pain, it was tight for  few days but has gotten better"    Patient Stated Goals  to lose weight, to be best self, return to walking,    Currently in Pain?  No/denies    Pain Score  0-No pain    Aggravating Factors   deep be         [facility] PT Assessment - 02/18/19  0001      AROM   Left Knee Extension  10    Left Knee Flexion  87                   [facility] Adult PT Treatment/Exercise - 02/18/19 0001      Knee/Hip Exercises: Stretches   Passive Hamstring Stretch  30 seconds;3 reps;Left   with strap   Quad Stretch  2 reps;30 seconds   with strap in prone     Knee/Hip Exercises: Aerobic   Nustep  L5 x 6 min UE/LE   working to end range of flexion     Knee/Hip Exercises: Machines for Strengthening   Cybex Leg Press  2 x 15 40#      Knee/Hip Exercises: Standing   Step Down  1 set;10 reps;Step Height: 6"   with bil UE    Gait Training  heel strike/ toe off 3 x 50 ft cues to promote form and equal stride    Other Standing Knee Exercises  5 x 20 sec hold L foot on step leaning forward       Knee/Hip Exercises: Seated   Heel Slides  1 set;10 reps   scooting heel back, then scooting bottom forward     Modalities   Modalities  Moist Heat      Moist Heat Therapy   Number Minutes Moist Heat  10 Minutes    Moist Heat Location  Knee      Manual Therapy   Joint Mobilization  PA/AP grade III tibiofemoral mobs, patellar mobs grade III in all directionsseated PA mobs with foot in ER to promote screwhome mechanism    Passive ROM  supine knee flexion working into end rang combined with hamstring activatoin             PT Education - 02/18/19 1142    Education Details  updated HEP for knee flexion on step    Person(s) Educated  Patient    Methods  Explanation;Verbal cues;Handout    Comprehension  Verbalized understanding;Verbal cues required       PT Short Term Goals - 01/15/19 1728      PT SHORT TERM GOAL #1   Title  pt to be I with inital HEP    Time  4    Period  Weeks    Status  New    Target Date  02/12/19      PT SHORT TERM GOAL #2   Title  pt to verbalize / demo techniques to decrease knee pain and inflammation via RICE and HEP    Time  4    Period  Weeks    Status  New    Target Date  02/12/19      PT SHORT  TERM GOAL #3   Title  reduce L kne edema grossly by ./= 1 - 2 cm to promote knee ROM    Time  8    Period  Weeks    Status  New    Target Date  02/12/19        PT Long Term Goals - 01/15/19 1731      PT LONG TERM GOAL #1   Title  increase L knee ROM >/= 2 - 115 degrees for functional ROM required for efficient gait and ADLs    Time  8    Period  Weeks    Status  New    Target Date  03/12/19      PT LONG TERM GOAL #2   Title  pt to be able to stand/ walk >/= 45 min and navigate 12 steps reciprocally with LRAD for functional mobility required for community ambulation    Time  8    Period  Weeks    Status  New    Target Date  03/12/19      PT LONG TERM GOAL #3   Title  increase L knee strength to >/=4+/5 to maximize knee/ hip stability with walking/ standing    Time  8    Period  Weeks    Status  New    Target Date  03/12/19      PT LONG TERM GOAL #4   Title  increase FOTO score to </=39% limited to demo improvement in function    Time  8    Period  Weeks    Status  New    Target Date  03/12/19      PT LONG TERM GOAL #5   Title  pt to be I with all HEP given as of last visit to maintain and progress current level of funciton.    Time  8  Period  Weeks    Status  New    Target Date  03/12/19            Plan - 02/18/19 1108    Clinical Impression Statement  pt presents after missing the last 3 appointments. she demonstrates some regression of ROM and exhibits an increased antalgic gait pattern. Continued working ROM and strengthening and practiced gait training to promote efficency which she required frequent cues for proper form.    PT Treatment/Interventions  ADLs/Self Care Home Management;Cryotherapy;Therapeutic activities;Therapeutic exercise;Balance training;Manual techniques;Passive range of motion;Taping;Vasopneumatic Device;Patient/family education    PT Next Visit Plan  (hx of Cx no E-stim or heat). update/ Review HEP, knee mobs/ ROM, bike vs nustep, LLE  strengthening, vaso    PT Home Exercise Plan  heel slide with strap, hamstring stretch (supine and seated), SLR, quad set, sit to stand    Consulted and Agree with Plan of Care  Patient       Patient will benefit from skilled therapeutic intervention in order to improve the following deficits and impairments:  Abnormal gait, Obesity, Pain, Postural dysfunction, Improper body mechanics, Decreased strength, Decreased range of motion, Decreased balance, Decreased activity tolerance, Decreased endurance, Increased edema  Visit Diagnosis: Chronic pain of left knee  Muscle weakness (generalized)  Stiffness of left knee, not elsewhere classified  Localized edema     Problem List Patient Active Problem List   Diagnosis Date Noted  . Status post total left knee replacement 12/22/2018  . MDD (major depressive disorder), recurrent episode, mild ([place]) 10/29/2018  . Autoimmune hepatitis ([place]) 10/29/2018  . OSA (obstructive sleep apnea) 10/29/2018  . Renal cyst, right 10/22/2018  . Lung nodule, solitary 10/22/2018  . Genital herpes simplex 10/29/2017  . Vitamin D deficiency 10/29/2017  . Class 2 obesity due to excess calories without serious comorbidity with body mass index (BMI) of 38.0 to 38.9 in adult 10/29/2017  . Essential hypertension 10/29/2017  . Pre-diabetes 02/14/2017  . Fibromyalgia 02/14/2017  . Substance abuse in remission ([place]) 02/14/2017  . Anxiety and depression 02/14/2017  . HX: breast cancer 02/14/2017  . Urge incontinence 11/13/2016  . Unilateral primary osteoarthritis, left knee 06/14/2016  . PMB (postmenopausal bleeding) 04/02/2016  . GERD (gastroesophageal reflux disease) 11/02/2015  . Angioedema 10/24/2013  . Tobacco abuse 10/24/2013  . DCIS (ductal carcinoma in situ) of breast 04/07/2013  . Depression 04/07/2013   Starr Lake PT, DPT, LAT, ATC  02/18/19  11:48 AM      [facility] [address] [place], [place], [address] Phone: [phone]   Fax:  [phone]  Name: Rachel Woodard MRN: [id number] Date of Birth: 1950/09/04

## 2019-02-23 ENCOUNTER — Other Ambulatory Visit: Payer: Self-pay | Admitting: Internal Medicine

## 2019-02-24 ENCOUNTER — Encounter: Payer: Self-pay | Admitting: Physical Therapy

## 2019-02-24 ENCOUNTER — Other Ambulatory Visit: Payer: Self-pay

## 2019-02-24 ENCOUNTER — Ambulatory Visit: Payer: Medicare HMO | Attending: Physician Assistant | Admitting: Physical Therapy

## 2019-02-24 DIAGNOSIS — M6281 Muscle weakness (generalized): Secondary | ICD-10-CM | POA: Insufficient documentation

## 2019-02-24 DIAGNOSIS — M25662 Stiffness of left knee, not elsewhere classified: Secondary | ICD-10-CM

## 2019-02-24 DIAGNOSIS — M25562 Pain in left knee: Secondary | ICD-10-CM | POA: Insufficient documentation

## 2019-02-24 DIAGNOSIS — G8929 Other chronic pain: Secondary | ICD-10-CM | POA: Diagnosis not present

## 2019-02-24 DIAGNOSIS — R600 Localized edema: Secondary | ICD-10-CM | POA: Diagnosis not present

## 2019-02-24 NOTE — Therapy (Signed)
[place], [place], [address] Phone: [phone]   Fax:  [phone]  Physical Therapy Treatment  Patient Details  Name: Rachel Woodard MRN: [id number] Date of Birth: [birth year] Referring Provider (PT): Aundra Dubin, [place]   Encounter Date: 02/24/2019  PT End of Session - 02/24/19 1101    Visit Number  7    Number of Visits  17    Date for PT Re-Evaluation  03/12/19    Authorization Type  MCR: Kx mod by 15th visit, progress note at 10th visit    PT Start Time  1058    PT Stop Time  1148    PT Time Calculation (min)  50 min    Activity Tolerance  Patient tolerated treatment well    Behavior During Therapy  [facility] for tasks assessed/performed       Past Medical History:  Diagnosis Date  . Anemia 2001   after gastric bypass  . Anxiety   . Arthritis   . Breast cancer ([place]) 2013   Breast cancer    Radiation therapy  . COPD (chronic obstructive pulmonary disease) ([place])   . Depression   . Fibromyalgia   . GERD (gastroesophageal reflux disease)   . Hypertension   . Personal history of radiation therapy 2013  . Pneumonia   . Pre-diabetes   . PTSD (post-traumatic stress disorder)   . Sleep apnea    uses CPAP    Past Surgical History:  Procedure Laterality Date  . ABDOMINAL HYSTERECTOMY  2018  . BREAST LUMPECTOMY Right 2013  . GASTRIC BYPASS  2001  . TOTAL KNEE ARTHROPLASTY Left 12/22/2018   Procedure: LEFT TOTAL KNEE ARTHROPLASTY;  Surgeon: Leandrew Koyanagi, MD;  Location: [place];  Service: Orthopedics;  Laterality: Left;    There were no vitals filed for this visit.  Subjective Assessment - 02/24/19 1102    Subjective  " I am feeling pretty stiff today and pain is at about 6/10 today"    Patient Stated Goals  to lose weight, to be best self, return to walking,    Currently in Pain?  Yes    Pain Score  6     Pain Location  Knee    Pain Orientation  Left    Pain Descriptors / Indicators   Aching    Pain Type  Surgical pain    Pain Onset  More than a month ago    Pain Frequency  Intermittent    Aggravating Factors   bending the knee         [facility] PT Assessment - 02/24/19 0001      Assessment   Medical Diagnosis  L TKA    Referring Provider (PT)  Aundra Dubin, PA-C    Onset Date/Surgical Date  12/22/18      AROM   Left Knee Extension  14   12 following manual   Left Knee Flexion  95   97 following manual      PROM   Left Knee Extension  8                   [facility] Adult PT Treatment/Exercise - 02/24/19 0001      Knee/Hip Exercises: Stretches   Passive Hamstring Stretch  30 seconds;3 reps;Left    Quad Stretch  30 seconds;4 reps   2 x in supine with strap, 2 x with foot on 6 inch step     Knee/Hip Exercises: Aerobic  Recumbent Bike  x 6 min full revolutions starting backward x 3 min, moving to foward x 3 min      Knee/Hip Exercises: Machines for Strengthening   Cybex Leg Press  2 x12 bil LE 20#, 2 x 12 20# LLE only      Knee/Hip Exercises: Standing   Forward Step Up  2 sets;15 reps;Step Height: 6";Both    Step Down  1 set;10 reps;Step Height: 6";Left    Wall Squat  1 set;15 reps      Knee/Hip Exercises: Supine   Quad Sets  1 set;10 reps   combined with manual over pressure (see manual section)     Moist Heat Therapy   Number Minutes Moist Heat  10 Minutes    Moist Heat Location  Knee      Manual Therapy   Joint Mobilization  PA/AP grade III tibiofemoral mobs, patellar mobs grade III in all directions,  femoral IR/ Tibial ER combined with active quad set 1 x 10 holding 5 seconds    Passive ROM  supine knee flexion working into end rang combined with hamstring activatoin             PT Education - 02/24/19 1142    Education Details  updated HEP for wall squat    Person(s) Educated  Patient    Methods  Explanation;Verbal cues    Comprehension  Verbalized understanding;Verbal cues required       PT Short Term Goals -  02/24/19 1251      PT SHORT TERM GOAL #1   Title  pt to be I with inital HEP    Period  Weeks    Status  Achieved      PT SHORT TERM GOAL #2   Title  pt to verbalize / demo techniques to decrease knee pain and inflammation via RICE and HEP    Period  Weeks    Status  Achieved      PT SHORT TERM GOAL #3   Title  reduce L kne edema grossly by ./= 1 - 2 cm to promote knee ROM    Period  Weeks    Status  Achieved        PT Long Term Goals - 01/15/19 1731      PT LONG TERM GOAL #1   Title  increase L knee ROM >/= 2 - 115 degrees for functional ROM required for efficient gait and ADLs    Time  8    Period  Weeks    Status  New    Target Date  03/12/19      PT LONG TERM GOAL #2   Title  pt to be able to stand/ walk >/= 45 min and navigate 12 steps reciprocally with LRAD for functional mobility required for community ambulation    Time  8    Period  Weeks    Status  New    Target Date  03/12/19      PT LONG TERM GOAL #3   Title  increase L knee strength to >/=4+/5 to maximize knee/ hip stability with walking/ standing    Time  8    Period  Weeks    Status  New    Target Date  03/12/19      PT LONG TERM GOAL #4   Title  increase FOTO score to </=39% limited to demo improvement in function    Time  8    Period  Weeks  Status  New    Target Date  03/12/19      PT LONG TERM GOAL #5   Title  pt to be I with all HEP given as of last visit to maintain and progress current level of funciton.    Time  8    Period  Weeks    Status  New    Target Date  03/12/19            Plan - 02/24/19 1249    Clinical Impression Statement  Rachel Woodard is demonstrating improvement in knee flexion increasing to 95 degrees, however did regression in extension which only improved after manual to 12 degrees. continued mobs to promote ROM and general strengthening which she tolerated well. continued MHP end of session to calm down pain    PT Next Visit Plan  update/ Review HEP, knee mobs/  ROM, bike vs nustep, LLE strengthening, vaso    PT Home Exercise Plan  heel slide with strap, hamstring stretch (supine and seated), SLR, quad set, sit to stand    Consulted and Agree with Plan of Care  Patient       Patient will benefit from skilled therapeutic intervention in order to improve the following deficits and impairments:  Abnormal gait, Obesity, Pain, Postural dysfunction, Improper body mechanics, Decreased strength, Decreased range of motion, Decreased balance, Decreased activity tolerance, Decreased endurance, Increased edema  Visit Diagnosis: Chronic pain of left knee  Muscle weakness (generalized)  Stiffness of left knee, not elsewhere classified  Localized edema     Problem List Patient Active Problem List   Diagnosis Date Noted  . Status post total left knee replacement 12/22/2018  . MDD (major depressive disorder), recurrent episode, mild ([place]) 10/29/2018  . Autoimmune hepatitis ([place]) 10/29/2018  . OSA (obstructive sleep apnea) 10/29/2018  . Renal cyst, right 10/22/2018  . Lung nodule, solitary 10/22/2018  . Genital herpes simplex 10/29/2017  . Vitamin D deficiency 10/29/2017  . Class 2 obesity due to excess calories without serious comorbidity with body mass index (BMI) of 38.0 to 38.9 in adult 10/29/2017  . Essential hypertension 10/29/2017  . Pre-diabetes 02/14/2017  . Fibromyalgia 02/14/2017  . Substance abuse in remission ([place]) 02/14/2017  . Anxiety and depression 02/14/2017  . HX: breast cancer 02/14/2017  . Urge incontinence 11/13/2016  . Unilateral primary osteoarthritis, left knee 06/14/2016  . PMB (postmenopausal bleeding) 04/02/2016  . GERD (gastroesophageal reflux disease) 11/02/2015  . Angioedema 10/24/2013  . Tobacco abuse 10/24/2013  . DCIS (ductal carcinoma in situ) of breast 04/07/2013  . Depression 04/07/2013   Starr Lake PT, DPT, LAT, ATC  02/24/19  12:52 PM      [facility] [address] [place], [place], [address] Phone: [phone]   Fax:  [phone]  Name: Rachel Woodard MRN: [id number] Date of Birth: Jan 03, 1951

## 2019-02-26 ENCOUNTER — Ambulatory Visit: Payer: Medicare HMO | Admitting: Physical Therapy

## 2019-03-03 ENCOUNTER — Telehealth: Payer: Self-pay | Admitting: *Deleted

## 2019-03-03 ENCOUNTER — Ambulatory Visit: Payer: Medicare HMO | Admitting: Physical Therapy

## 2019-03-03 NOTE — Telephone Encounter (Signed)
Forwarded voice message received from Cathren Laine, Office admin for [employer], from patient. RNCM called patient back and discussed that she has requested an "updated" version of her FL2. Informed that the [facility] is a document completed by the hospital SW during admission to the hospital prior to discharge. Office staff cannot provide an updated or altered version of this form. Can re-print for her, but will be the exact same FL2 form that was sent to all facilities during hospitalization on 12/24/18. She verbalized understanding and thanked RNCM.

## 2019-03-05 ENCOUNTER — Ambulatory Visit: Payer: Medicare HMO | Admitting: Physical Therapy

## 2019-03-05 ENCOUNTER — Other Ambulatory Visit: Payer: Self-pay

## 2019-03-05 DIAGNOSIS — G4733 Obstructive sleep apnea (adult) (pediatric): Secondary | ICD-10-CM | POA: Diagnosis not present

## 2019-03-05 DIAGNOSIS — G8929 Other chronic pain: Secondary | ICD-10-CM | POA: Diagnosis not present

## 2019-03-05 DIAGNOSIS — R600 Localized edema: Secondary | ICD-10-CM

## 2019-03-05 DIAGNOSIS — M6281 Muscle weakness (generalized): Secondary | ICD-10-CM | POA: Diagnosis not present

## 2019-03-05 DIAGNOSIS — M25662 Stiffness of left knee, not elsewhere classified: Secondary | ICD-10-CM | POA: Diagnosis not present

## 2019-03-05 DIAGNOSIS — M25562 Pain in left knee: Secondary | ICD-10-CM | POA: Diagnosis not present

## 2019-03-05 NOTE — Therapy (Addendum)
[place], [place], [address] Phone: [phone]   Fax:  [phone]  Physical Therapy Treatment / Discharge  Patient Details  Name: Rachel Woodard MRN: [id number] Date of Birth: [birth year] Referring Provider (PT): Aundra Dubin, [place]   Encounter Date: 03/05/2019  PT End of Session - 03/05/19 2633    Visit Number  8    Number of Visits  17    Date for PT Re-Evaluation  03/12/19    Authorization Type  MCR: Kx mod by 15th visit, progress note at 10th visit    PT Start Time  0922    PT Stop Time  1012    PT Time Calculation (min)  50 min    Activity Tolerance  Patient tolerated treatment well    Behavior During Therapy  [facility] for tasks assessed/performed       Past Medical History:  Diagnosis Date  . Anemia 2001   after gastric bypass  . Anxiety   . Arthritis   . Breast cancer ([place]) 2013   Breast cancer    Radiation therapy  . COPD (chronic obstructive pulmonary disease) ([place])   . Depression   . Fibromyalgia   . GERD (gastroesophageal reflux disease)   . Hypertension   . Personal history of radiation therapy 2013  . Pneumonia   . Pre-diabetes   . PTSD (post-traumatic stress disorder)   . Sleep apnea    uses CPAP    Past Surgical History:  Procedure Laterality Date  . ABDOMINAL HYSTERECTOMY  2018  . BREAST LUMPECTOMY Right 2013  . GASTRIC BYPASS  2001  . TOTAL KNEE ARTHROPLASTY Left 12/22/2018   Procedure: LEFT TOTAL KNEE ARTHROPLASTY;  Surgeon: Leandrew Koyanagi, MD;  Location: [facility];  Service: Orthopedics;  Laterality: Left;    There were no vitals filed for this visit.  Subjective Assessment - 03/05/19 0923    Subjective  " I am feeling more stiff today but don't have any pain"    Patient Stated Goals  to lose weight, to be best self, return to walking,    Currently in Pain?  No/denies    Pain Orientation  Left    Pain Type  Surgical pain    Pain Onset  More than a month ago    Pain Frequency  Intermittent    Aggravating Factors   bending the knee    Pain Relieving Factors  moving around, exercise         [facility] PT Assessment - 03/05/19 0001      Assessment   Medical Diagnosis  L TKA    Referring Provider (PT)  Aundra Dubin, PA-C    Onset Date/Surgical Date  12/22/18      Observation/Other Assessments   Focus on Therapeutic Outcomes (FOTO)   29% limited      AROM   Left Knee Flexion  95   99 following manual     PROM   Left Knee Flexion  101                   [facility] Adult PT Treatment/Exercise - 03/05/19 0001      Knee/Hip Exercises: Stretches   Passive Hamstring Stretch  30 seconds;3 reps;Left   in supine with strap   Quad Stretch  3 reps;30 seconds;Left   in supine with strap     Knee/Hip Exercises: Machines for Strengthening   Cybex Knee Extension  2 x 15 with 10#  Cybex Knee Flexion  2 x 15 25#    Cybex Leg Press  2 x 15 40# bil LE   lowering sled 1 -2 levels between sets to increase flexion     Knee/Hip Exercises: Standing   Step Down  1 set;Step Height: 6";15 reps;Left      Knee/Hip Exercises: Supine   Quad Sets  1 set;15 reps   with towel inpopliteal space for feed back   Quad Sets Limitations  cues to focus on eliciting strong and constatn quad contraction, initally quad with exhibit faciculations but wouldn't maintain constant contraction.      Manual Therapy   Joint Mobilization  PA/AP grade III tibiofemoral mobs Grade III     Passive ROM  supine knee flexion working into end range combined with hamstring activation             PT Education - 03/05/19 1016    Education Details  updated HEP for wall heel slide, discussed importance of HEP consistency to maximize progress    Person(s) Educated  Patient    Methods  Explanation;Verbal cues;Handout    Comprehension  Verbalized understanding;Verbal cues required       PT Short Term Goals - 03/05/19 9767      PT SHORT TERM GOAL #1   Title  pt to be I  with inital HEP    Time  4    Period  Weeks    Status  Achieved      PT SHORT TERM GOAL #2   Title  pt to verbalize / demo techniques to decrease knee pain and inflammation via RICE and HEP    Period  Weeks    Status  Achieved      PT SHORT TERM GOAL #3   Title  reduce L kne edema grossly by ./= 1 - 2 cm to promote knee ROM    Period  Weeks    Status  Achieved        PT Long Term Goals - 03/05/19 3419      PT LONG TERM GOAL #1   Title  increase L knee ROM >/= 2 - 115 degrees for functional ROM required for efficient gait and ADLs    Period  Weeks            Plan - 03/05/19 1017    Clinical Impression Statement  pt reports no pain mostly stiffness today. continued working ROM to progress both flexion/ extension. She continues to remain at 95 degrees of flexion today. She is able to perform exercises with no rpeort of complaints. updated HEP for wall heel slides to continue flexion progression. pt reported feeling good and declined modalities at end of session    PT Treatment/Interventions  ADLs/Self Care Home Management;Cryotherapy;Therapeutic activities;Therapeutic exercise;Balance training;Manual techniques;Passive range of motion;Taping;Vasopneumatic Device;Patient/family education    PT Next Visit Plan  update HEP PRN, CKC, resisted gait (if she wore shoes) knee mobs/ ROM, bike vs nustep, LLE strengthening, vaso PRN    PT Home Exercise Plan  heel slide with strap, hamstring stretch (supine and seated), SLR, quad set, sit to stand    Consulted and Agree with Plan of Care  Patient       Patient will benefit from skilled therapeutic intervention in order to improve the following deficits and impairments:  Abnormal gait, Obesity, Pain, Postural dysfunction, Improper body mechanics, Decreased strength, Decreased range of motion, Decreased balance, Decreased activity tolerance, Decreased endurance, Increased edema  Visit Diagnosis: Chronic pain of  left knee  Muscle weakness  (generalized)  Stiffness of left knee, not elsewhere classified  Localized edema     Problem List Patient Active Problem List   Diagnosis Date Noted  . Status post total left knee replacement 12/22/2018  . MDD (major depressive disorder), recurrent episode, mild ([place]) 10/29/2018  . Autoimmune hepatitis ([place]) 10/29/2018  . OSA (obstructive sleep apnea) 10/29/2018  . Renal cyst, right 10/22/2018  . Lung nodule, solitary 10/22/2018  . Genital herpes simplex 10/29/2017  . Vitamin D deficiency 10/29/2017  . Class 2 obesity due to excess calories without serious comorbidity with body mass index (BMI) of 38.0 to 38.9 in adult 10/29/2017  . Essential hypertension 10/29/2017  . Pre-diabetes 02/14/2017  . Fibromyalgia 02/14/2017  . Substance abuse in remission ([place]) 02/14/2017  . Anxiety and depression 02/14/2017  . HX: breast cancer 02/14/2017  . Urge incontinence 11/13/2016  . Unilateral primary osteoarthritis, left knee 06/14/2016  . PMB (postmenopausal bleeding) 04/02/2016  . GERD (gastroesophageal reflux disease) 11/02/2015  . Angioedema 10/24/2013  . Tobacco abuse 10/24/2013  . DCIS (ductal carcinoma in situ) of breast 04/07/2013  . Depression 04/07/2013   Starr Lake PT, DPT, LAT, ATC  03/05/19  10:24 AM      [facility] [address] [place], [place], [address] Phone: [phone]   Fax:  [phone]  Name: Rachel Woodard MRN: [id number] Date of Birth: 1951-05-21      PHYSICAL THERAPY DISCHARGE SUMMARY  Visits from Start of Care: 8  Current functional level related to goals / functional outcomes: See goals   Remaining deficits: Pt requested to cancel due to difficulties with transportation and current housing status. Current functional status unknown due to not returning.    Education / Equipment: HEP, theraband, posture,   Plan: Patient agrees to discharge.  Patient goals were partially  met. Patient is being discharged due to the patient's request.  ?????         Johntay Doolen PT, DPT, LAT, ATC  04/08/19  1:48 PM

## 2019-03-09 ENCOUNTER — Telehealth: Payer: Self-pay | Admitting: Internal Medicine

## 2019-03-09 NOTE — Telephone Encounter (Signed)
Call placed to patient and informed her that completed FL2 was faxed to Jeronimo Greaves at [facility],  - fax # [phone].

## 2019-03-09 NOTE — Telephone Encounter (Signed)
New Message   Pt calling to check on the paperwork that she dropped of to be signed on 9/10 and would like to know if they are ready for pick up. Please f/u

## 2019-03-10 NOTE — Telephone Encounter (Signed)
Call received from patient. She explained that additional information is needed on FL2. She received the request from [facility] and will fax to this CM

## 2019-03-11 ENCOUNTER — Ambulatory Visit: Payer: Medicare HMO | Admitting: Physical Therapy

## 2019-03-11 ENCOUNTER — Telehealth: Payer: Self-pay

## 2019-03-11 ENCOUNTER — Telehealth: Payer: Self-pay | Admitting: Internal Medicine

## 2019-03-11 NOTE — Telephone Encounter (Signed)
Pt would like a call back when possible to follow up on discussion from 03/10/2019 about an  FL2

## 2019-03-11 NOTE — Telephone Encounter (Signed)
Call placed to patient. She said that she contacted [facility] today and they were supposed to fax this CM request for more information for FL2.  She said that the plan is for her to go to " domiciliary" residence.

## 2019-03-16 ENCOUNTER — Telehealth: Payer: Self-pay | Admitting: Internal Medicine

## 2019-03-16 ENCOUNTER — Ambulatory Visit: Payer: Medicare HMO | Admitting: Physical Therapy

## 2019-03-16 NOTE — Telephone Encounter (Signed)
Rachel Woodard called you to let you know that the fl2 is going to be sent by e-mail because they saying by fax is not going thru .

## 2019-03-17 ENCOUNTER — Telehealth: Payer: Self-pay | Admitting: Internal Medicine

## 2019-03-17 ENCOUNTER — Telehealth: Payer: Self-pay | Admitting: Physical Therapy

## 2019-03-17 ENCOUNTER — Ambulatory Visit: Payer: Medicare HMO | Admitting: Orthopaedic Surgery

## 2019-03-17 DIAGNOSIS — H5213 Myopia, bilateral: Secondary | ICD-10-CM | POA: Diagnosis not present

## 2019-03-17 DIAGNOSIS — I10 Essential (primary) hypertension: Secondary | ICD-10-CM | POA: Diagnosis not present

## 2019-03-17 DIAGNOSIS — H524 Presbyopia: Secondary | ICD-10-CM | POA: Diagnosis not present

## 2019-03-17 DIAGNOSIS — H35033 Hypertensive retinopathy, bilateral: Secondary | ICD-10-CM | POA: Diagnosis not present

## 2019-03-17 DIAGNOSIS — R7303 Prediabetes: Secondary | ICD-10-CM | POA: Diagnosis not present

## 2019-03-17 DIAGNOSIS — E119 Type 2 diabetes mellitus without complications: Secondary | ICD-10-CM | POA: Diagnosis not present

## 2019-03-17 DIAGNOSIS — H52223 Regular astigmatism, bilateral: Secondary | ICD-10-CM | POA: Diagnosis not present

## 2019-03-17 NOTE — Telephone Encounter (Signed)
Spoke to patient regarding missed appointment yesterday and to remind her of upcoming appointments. She reports that she is homeless and needs to cancel her appointments. She declined my offer for transportation services. I asked her to call if and when she would like to resume PT.

## 2019-03-17 NOTE — Telephone Encounter (Signed)
Patient called back stating it is in regards to paperwork that she needs filled out by thursday and needs to talk to someone about processing them. Please follow up.

## 2019-03-17 NOTE — Telephone Encounter (Signed)
Patient called wanting to speak Rachel Woodard states she needs her to check her emails. Please follow up.

## 2019-03-18 ENCOUNTER — Telehealth: Payer: Self-pay

## 2019-03-18 ENCOUNTER — Ambulatory Visit (INDEPENDENT_AMBULATORY_CARE_PROVIDER_SITE_OTHER): Payer: Medicare HMO | Admitting: Physician Assistant

## 2019-03-18 DIAGNOSIS — Z96652 Presence of left artificial knee joint: Secondary | ICD-10-CM

## 2019-03-18 MED ORDER — AMOXICILLIN 500 MG PO CAPS: ORAL_CAPSULE | ORAL | 0 refills | Status: DC

## 2019-03-18 NOTE — Progress Notes (Signed)
Post-Op Visit Note   Patient: Rachel Woodard           Date of Birth: 1950-11-01           MRN: [id number] Visit Date: 03/18/2019 PCP: Ladell Pier, MD   Assessment & Plan:  Chief Complaint:  Chief Complaint  Patient presents with  . Left Knee - Follow-up    12/22/2018 Left TKA   Visit Diagnoses:  1. Status post total left knee replacement     Plan: Patient is a pleasant 68 year old female who presents our clinic today 45 days status post left total knee replacement, date of surgery 12/22/2018.  She has been doing fairly well.  She has recently stopped attending formal physical therapy due to personal reasons at home.  She is still working on her home exercise program.  Minimal pain.  Examination of left knee shows a small effusion.  Range of motion 0 to 100 degrees.  Stable to valgus varus stress.  She is neurovascular intact distally.  At this point, she will continue with her home exercise program.  Dental prophylaxis reinforced.  Follow-up with [place] in 3 months time for repeat evaluation and 2 view x-rays of the left knee.  Call with concerns or questions.  Follow-Up Instructions: Return in about 3 months (around 06/17/2019).   Orders:  No orders of the defined types were placed in this encounter.  Meds ordered this encounter  Medications  . amoxicillin (AMOXIL) 500 MG capsule    Sig: Take 4 tabs po one hour prior to procedure    Dispense:  12 capsule    Refill:  0    Imaging: No new imaging  PMFS History: Patient Active Problem List   Diagnosis Date Noted  . Status post total left knee replacement 12/22/2018  . MDD (major depressive disorder), recurrent episode, mild ([place]) 10/29/2018  . Autoimmune hepatitis ([place]) 10/29/2018  . OSA (obstructive sleep apnea) 10/29/2018  . Renal cyst, right 10/22/2018  . Lung nodule, solitary 10/22/2018  . Genital herpes simplex 10/29/2017  . Vitamin D deficiency 10/29/2017  . Class 2 obesity due to excess calories  without serious comorbidity with body mass index (BMI) of 38.0 to 38.9 in adult 10/29/2017  . Essential hypertension 10/29/2017  . Pre-diabetes 02/14/2017  . Fibromyalgia 02/14/2017  . Substance abuse in remission ([place]) 02/14/2017  . Anxiety and depression 02/14/2017  . HX: breast cancer 02/14/2017  . Urge incontinence 11/13/2016  . Unilateral primary osteoarthritis, left knee 06/14/2016  . PMB (postmenopausal bleeding) 04/02/2016  . GERD (gastroesophageal reflux disease) 11/02/2015  . Angioedema 10/24/2013  . Tobacco abuse 10/24/2013  . DCIS (ductal carcinoma in situ) of breast 04/07/2013  . Depression 04/07/2013   Past Medical History:  Diagnosis Date  . Anemia 2001   after gastric bypass  . Anxiety   . Arthritis   . Breast cancer ([place]) 2013   Breast cancer    Radiation therapy  . COPD (chronic obstructive pulmonary disease) ([place])   . Depression   . Fibromyalgia   . GERD (gastroesophageal reflux disease)   . Hypertension   . Personal history of radiation therapy 2013  . Pneumonia   . Pre-diabetes   . PTSD (post-traumatic stress disorder)   . Sleep apnea    uses CPAP    No family history on file.  Past Surgical History:  Procedure Laterality Date  . ABDOMINAL HYSTERECTOMY  2018  . BREAST LUMPECTOMY Right 2013  . GASTRIC BYPASS  2001  . TOTAL  KNEE ARTHROPLASTY Left 12/22/2018   Procedure: LEFT TOTAL KNEE ARTHROPLASTY;  Surgeon: Leandrew Koyanagi, MD;  Location: [facility];  Service: Orthopedics;  Laterality: Left;   Social History   Occupational History  . Not on file  Tobacco Use  . Smoking status: Current Every Day Smoker    Packs/day: 0.50    Types: Cigarettes  . Smokeless tobacco: Never Used  Substance and Sexual Activity  . Alcohol use: Yes    Comment: seldom  . Drug use: No    Types: Cocaine    Comment: clean x 60 days on 11/28/15  . Sexual activity: Yes

## 2019-03-18 NOTE — Telephone Encounter (Signed)
Call placed to patient and informed her that this CM has not received an email with the requested information for the FL2.

## 2019-03-18 NOTE — Telephone Encounter (Signed)
Patient called again stating she needs paperwork filled out by tomorrow. States she is homeless and needs a place to stay. Please follow up.

## 2019-03-19 ENCOUNTER — Telehealth: Payer: Self-pay

## 2019-03-19 ENCOUNTER — Ambulatory Visit: Payer: Medicare HMO | Admitting: Physical Therapy

## 2019-03-19 NOTE — Telephone Encounter (Signed)
Attempted to contact the patient to inform her of the status of FL2.  Message left with call back requested. If this CM is not in the office, instructions left to ask for [facility].   FL2 emailed to [email or web address] as requested.  This CM did not receive any additional information from Latoya Hollman/[facility] [place] about what was needed to be included on this FL2. The information that was received from her was  a blank FL2.

## 2019-03-20 ENCOUNTER — Telehealth: Payer: Self-pay | Admitting: Licensed Clinical Social Worker

## 2019-03-20 NOTE — Telephone Encounter (Signed)
Call placed to Mr. Rachel Woodard with [facility]. He confirmed receipt of FL2 emailed by RN CM on 03/19/2019. No additional concerns noted.

## 2019-03-23 ENCOUNTER — Encounter: Payer: Medicare HMO | Admitting: Physical Therapy

## 2019-03-24 DIAGNOSIS — Z008 Encounter for other general examination: Secondary | ICD-10-CM | POA: Diagnosis not present

## 2019-03-24 DIAGNOSIS — Z0000 Encounter for general adult medical examination without abnormal findings: Secondary | ICD-10-CM | POA: Diagnosis not present

## 2019-03-24 DIAGNOSIS — Z96652 Presence of left artificial knee joint: Secondary | ICD-10-CM | POA: Diagnosis not present

## 2019-03-24 DIAGNOSIS — N183 Chronic kidney disease, stage 3 (moderate): Secondary | ICD-10-CM | POA: Diagnosis not present

## 2019-03-24 DIAGNOSIS — Z6836 Body mass index (BMI) 36.0-36.9, adult: Secondary | ICD-10-CM | POA: Diagnosis not present

## 2019-03-24 DIAGNOSIS — F319 Bipolar disorder, unspecified: Secondary | ICD-10-CM | POA: Diagnosis not present

## 2019-03-24 DIAGNOSIS — Z23 Encounter for immunization: Secondary | ICD-10-CM | POA: Diagnosis not present

## 2019-03-24 DIAGNOSIS — I129 Hypertensive chronic kidney disease with stage 1 through stage 4 chronic kidney disease, or unspecified chronic kidney disease: Secondary | ICD-10-CM | POA: Diagnosis not present

## 2019-03-26 ENCOUNTER — Ambulatory Visit: Payer: Medicare HMO | Admitting: Physical Therapy

## 2019-03-29 ENCOUNTER — Other Ambulatory Visit: Payer: Self-pay | Admitting: Internal Medicine

## 2019-03-29 DIAGNOSIS — Z1231 Encounter for screening mammogram for malignant neoplasm of breast: Secondary | ICD-10-CM

## 2019-03-30 ENCOUNTER — Ambulatory Visit: Payer: Medicare HMO | Admitting: Internal Medicine

## 2019-03-31 ENCOUNTER — Ambulatory Visit: Payer: Medicare HMO | Admitting: Physical Therapy

## 2019-04-02 ENCOUNTER — Encounter: Payer: Medicare HMO | Admitting: Physical Therapy

## 2019-04-06 ENCOUNTER — Encounter: Payer: Medicare HMO | Admitting: Physical Therapy

## 2019-04-08 ENCOUNTER — Other Ambulatory Visit: Payer: Self-pay | Admitting: Gastroenterology

## 2019-04-08 ENCOUNTER — Ambulatory Visit
Admission: RE | Admit: 2019-04-08 | Discharge: 2019-04-08 | Disposition: A | Discharge status: Home / Self Care | Payer: Medicare HMO | Source: Ambulatory Visit | Attending: Gastroenterology | Admitting: Gastroenterology

## 2019-04-08 ENCOUNTER — Other Ambulatory Visit: Payer: Self-pay

## 2019-04-08 DIAGNOSIS — K219 Gastro-esophageal reflux disease without esophagitis: Secondary | ICD-10-CM

## 2019-04-23 ENCOUNTER — Telehealth: Payer: Self-pay | Admitting: Orthopaedic Surgery

## 2019-04-23 DIAGNOSIS — Z96652 Presence of left artificial knee joint: Secondary | ICD-10-CM

## 2019-04-23 NOTE — Telephone Encounter (Signed)
Pt called in requesting to have a new referral sent to physical therapy location on [address].    [phone]

## 2019-04-24 NOTE — Telephone Encounter (Signed)
New referral entered. I called patient and advised.

## 2019-04-24 NOTE — Telephone Encounter (Signed)
Yes, thank you.

## 2019-04-24 NOTE — Telephone Encounter (Signed)
Ok for referral?

## 2019-05-04 ENCOUNTER — Ambulatory Visit: Payer: Medicare HMO | Admitting: Podiatry

## 2019-05-12 ENCOUNTER — Ambulatory Visit: Payer: Medicare HMO | Admitting: Physical Therapy

## 2019-06-01 ENCOUNTER — Encounter: Payer: Self-pay | Admitting: Podiatry

## 2019-06-01 ENCOUNTER — Ambulatory Visit (INDEPENDENT_AMBULATORY_CARE_PROVIDER_SITE_OTHER): Payer: Medicare HMO | Admitting: Podiatry

## 2019-06-01 ENCOUNTER — Other Ambulatory Visit: Payer: Self-pay

## 2019-06-01 ENCOUNTER — Ambulatory Visit: Payer: Medicare HMO

## 2019-06-01 DIAGNOSIS — M7732 Calcaneal spur, left foot: Secondary | ICD-10-CM | POA: Diagnosis not present

## 2019-06-01 DIAGNOSIS — M722 Plantar fascial fibromatosis: Secondary | ICD-10-CM

## 2019-06-01 MED ORDER — MELOXICAM 15 MG PO TABS: 15.0000 mg | ORAL_TABLET | Freq: Every day | ORAL | 1 refills | Status: DC

## 2019-06-03 ENCOUNTER — Ambulatory Visit (INDEPENDENT_AMBULATORY_CARE_PROVIDER_SITE_OTHER): Payer: Medicare HMO

## 2019-06-03 ENCOUNTER — Encounter: Payer: Self-pay | Admitting: Orthopaedic Surgery

## 2019-06-03 ENCOUNTER — Other Ambulatory Visit: Payer: Self-pay

## 2019-06-03 ENCOUNTER — Ambulatory Visit (INDEPENDENT_AMBULATORY_CARE_PROVIDER_SITE_OTHER): Payer: Medicare HMO | Admitting: Orthopaedic Surgery

## 2019-06-03 DIAGNOSIS — Z96652 Presence of left artificial knee joint: Secondary | ICD-10-CM

## 2019-06-03 NOTE — Progress Notes (Signed)
Post-Op Visit Note   Patient: Rachel Woodard           Date of Birth: 03-14-1951           MRN: [id number] Visit Date: 06/03/2019 PCP: Rocco Serene, MD   Assessment & Plan:  Chief Complaint:  Chief Complaint  Patient presents with  . Left Knee - Follow-up   Visit Diagnoses:  1. Status post total left knee replacement     Plan: Patient is a pleasant 68 year old female who presents our clinic today 5 months status post left total knee replacement, date of surgery 12/22/2018.  She has been doing okay.  She has not been to formal physical therapy in quite some time.  She has recently moved into her own condo after living a bad area with her daughter.  She now feels as though she can resume her physical therapy.  She has however been doing a home exercise program.  Examination of the left knee reveals a fully healed surgical scar.  No evidence of infection or cellulitis.  Calf is soft nontender.  She is neurovascular stress.  Range of motion is 5 to 90 degrees.  She is neurovascular intact distally.  At this point, she will resume formal physical therapy.  She already has a prescription but just needs to call and schedule an appointment.  She will continue with her home exercise program.  She will follow-up with [place] in 4 months time when she is 9 months out for recheck.  Dental prophylaxis reinforced.  Call with concerns or questions.  Follow-Up Instructions: Return in about 4 months (around 10/02/2019).   Orders:  Orders Placed This Encounter  Procedures  . XR Knee 1-2 Views Left   No orders of the defined types were placed in this encounter.   Imaging: Xr Knee 1-2 Views Left  Result Date: 06/03/2019 Well-seated prosthesis without complication   PMFS History: Patient Active Problem List   Diagnosis Date Noted  . Status post total left knee replacement 12/22/2018  . MDD (major depressive disorder), recurrent episode, mild ([place]) 10/29/2018  . Autoimmune hepatitis ([place])  10/29/2018  . OSA (obstructive sleep apnea) 10/29/2018  . Renal cyst, right 10/22/2018  . Lung nodule, solitary 10/22/2018  . Genital herpes simplex 10/29/2017  . Vitamin D deficiency 10/29/2017  . Class 2 obesity due to excess calories without serious comorbidity with body mass index (BMI) of 38.0 to 38.9 in adult 10/29/2017  . Essential hypertension 10/29/2017  . Pre-diabetes 02/14/2017  . Fibromyalgia 02/14/2017  . Substance abuse in remission ([place]) 02/14/2017  . Anxiety and depression 02/14/2017  . HX: breast cancer 02/14/2017  . Urge incontinence 11/13/2016  . Unilateral primary osteoarthritis, left knee 06/14/2016  . PMB (postmenopausal bleeding) 04/02/2016  . GERD (gastroesophageal reflux disease) 11/02/2015  . Angioedema 10/24/2013  . Tobacco abuse 10/24/2013  . DCIS (ductal carcinoma in situ) of breast 04/07/2013  . Depression 04/07/2013   Past Medical History:  Diagnosis Date  . Anemia 2001   after gastric bypass  . Anxiety   . Arthritis   . Breast cancer ([place]) 2013   Breast cancer    Radiation therapy  . COPD (chronic obstructive pulmonary disease) ([place])   . Depression   . Fibromyalgia   . GERD (gastroesophageal reflux disease)   . Hypertension   . Personal history of radiation therapy 2013  . Pneumonia   . Pre-diabetes   . PTSD (post-traumatic stress disorder)   . Sleep apnea    uses  CPAP    History reviewed. No pertinent family history.  Past Surgical History:  Procedure Laterality Date  . ABDOMINAL HYSTERECTOMY  2018  . BREAST LUMPECTOMY Right 2013  . GASTRIC BYPASS  2001  . TOTAL KNEE ARTHROPLASTY Left 12/22/2018   Procedure: LEFT TOTAL KNEE ARTHROPLASTY;  Surgeon: Leandrew Koyanagi, MD;  Location: [facility];  Service: Orthopedics;  Laterality: Left;   Social History   Occupational History  . Not on file  Tobacco Use  . Smoking status: Current Every Day Smoker    Packs/day: 0.50    Types: Cigarettes  . Smokeless tobacco: Never Used  Substance and  Sexual Activity  . Alcohol use: Yes    Comment: seldom  . Drug use: No    Types: Cocaine    Comment: clean x 60 days on 11/28/15  . Sexual activity: Yes

## 2019-06-04 NOTE — Progress Notes (Signed)
   Subjective: 68 y.o. female presenting today for follow up evaluation of plantar fasciitis of the left foot. She states the pain returned about one month ago and is just as severe as it was when it started initially. She states she has been exercising lately which she attributes to the pain. She states she has had knee surgery since being here last. Patient is here for further evaluation and treatment.    Past Medical History:  Diagnosis Date  . Anemia 2001   after gastric bypass  . Anxiety   . Arthritis   . Breast cancer ([place]) 2013   Breast cancer    Radiation therapy  . COPD (chronic obstructive pulmonary disease) ([place])   . Depression   . Fibromyalgia   . GERD (gastroesophageal reflux disease)   . Hypertension   . Personal history of radiation therapy 2013  . Pneumonia   . Pre-diabetes   . PTSD (post-traumatic stress disorder)   . Sleep apnea    uses CPAP     Objective: Physical Exam General: The patient is alert and oriented x3 in no acute distress.  Dermatology: Skin is warm, dry and supple bilateral lower extremities. Negative for open lesions or macerations bilateral.   Vascular: Dorsalis Pedis and Posterior Tibial pulses palpable bilateral.  Capillary fill time is immediate to all digits.  Neurological: Epicritic and protective threshold intact bilateral.   Musculoskeletal: Tenderness to palpation to the plantar aspect of the left heel along the plantar fascia. All other joints range of motion within normal limits bilateral. Strength 5/5 in all groups bilateral.   Radiographic exam: Normal osseous mineralization. Joint spaces preserved. No fracture/dislocation/boney destruction. No other soft tissue abnormalities or radiopaque foreign bodies.   Assessment: 1. Plantar fasciitis left foot  Plan of Care:  1. Patient evaluated. Xrays reviewed.   2. Injection of 0.5cc Celestone soluspan injected into the left plantar fascia.  3. Rx for Meloxicam ordered for  patient. 4. Recommended good shoe gear.  5. Instructed patient regarding therapies and modalities at home to alleviate symptoms.  6. Return to clinic in 4 weeks.     Edrick Kins, DPM [facility]  Dr. Edrick Kins, DPM    [address], [place] [address]                Office [phone]  Fax [phone]

## 2019-06-10 ENCOUNTER — Telehealth: Payer: Self-pay | Admitting: *Deleted

## 2019-06-10 MED ORDER — MELOXICAM 15 MG PO TABS: 15.0000 mg | ORAL_TABLET | Freq: Every day | ORAL | 1 refills | Status: DC

## 2019-06-10 NOTE — Telephone Encounter (Signed)
Pt states she saw Dr. Amalia Hailey Monday a week ago and he said he was going to send in a antibiotic, and the pharmacy states they don't have it. I reviewed the Medications and Meloxicam was confirmed received 06/01/2019 at 5:14pm.

## 2019-06-10 NOTE — Telephone Encounter (Signed)
I informed pt Dr. Amalia Hailey had ordered an antiinflammatory pain medication Meloxicam on 06/01/2019 and it was confirmed received by the pharmacy at 5:14pm. I told pt I would reorder to contact her pharmacy for pick up.

## 2019-06-15 ENCOUNTER — Other Ambulatory Visit: Payer: Self-pay | Admitting: Gastroenterology

## 2019-06-15 DIAGNOSIS — R1319 Other dysphagia: Secondary | ICD-10-CM

## 2019-06-15 DIAGNOSIS — R1310 Dysphagia, unspecified: Secondary | ICD-10-CM

## 2019-06-18 ENCOUNTER — Telehealth: Payer: Self-pay | Admitting: Pulmonary Disease

## 2019-06-18 NOTE — Telephone Encounter (Signed)
Spoke with pt, she wanted to ask about procedures and tests that were ordered by her GI doctor. I explained to her that the procedures seemed valid and that if there is anything with her lungs, she should call Dr. Ander Slade. I reassured her that she was on the right track and that if she had any questions, then to call us. Nothing further is needed.

## 2019-06-22 ENCOUNTER — Telehealth: Payer: Self-pay

## 2019-06-22 NOTE — Telephone Encounter (Signed)
Patient called back, stating she still has not received meloxicam. She called the pharmacy and they told her her insurance had denied coverage. She is still in pain. Please call patient and advise next steps

## 2019-06-23 ENCOUNTER — Other Ambulatory Visit: Payer: Self-pay

## 2019-06-23 MED ORDER — MELOXICAM 15 MG PO TABS: 15.0000 mg | ORAL_TABLET | Freq: Every day | ORAL | 1 refills | Status: DC

## 2019-06-29 ENCOUNTER — Ambulatory Visit: Payer: Medicare HMO | Admitting: Podiatry

## 2019-06-29 ENCOUNTER — Other Ambulatory Visit: Payer: Self-pay | Admitting: Pulmonary Disease

## 2019-06-29 DIAGNOSIS — J42 Unspecified chronic bronchitis: Secondary | ICD-10-CM

## 2019-06-29 DIAGNOSIS — R0602 Shortness of breath: Secondary | ICD-10-CM

## 2019-06-30 ENCOUNTER — Inpatient Hospital Stay: Admission: RE | Admit: 2019-06-30 | Payer: Medicare HMO | Source: Ambulatory Visit

## 2019-07-02 ENCOUNTER — Ambulatory Visit
Admission: RE | Admit: 2019-07-02 | Discharge: 2019-07-02 | Disposition: A | Discharge status: Home / Self Care | Payer: Medicare HMO | Source: Ambulatory Visit | Attending: Internal Medicine | Admitting: Internal Medicine

## 2019-07-02 ENCOUNTER — Other Ambulatory Visit: Payer: Self-pay

## 2019-07-02 ENCOUNTER — Other Ambulatory Visit: Payer: Self-pay | Admitting: Internal Medicine

## 2019-07-02 DIAGNOSIS — Z1231 Encounter for screening mammogram for malignant neoplasm of breast: Secondary | ICD-10-CM

## 2019-07-03 ENCOUNTER — Other Ambulatory Visit: Payer: Self-pay | Admitting: Internal Medicine

## 2019-07-03 DIAGNOSIS — R928 Other abnormal and inconclusive findings on diagnostic imaging of breast: Secondary | ICD-10-CM

## 2019-07-07 ENCOUNTER — Other Ambulatory Visit: Payer: Self-pay

## 2019-07-07 ENCOUNTER — Ambulatory Visit
Admission: RE | Admit: 2019-07-07 | Discharge: 2019-07-07 | Disposition: A | Discharge status: Home / Self Care | Payer: Medicare HMO | Source: Ambulatory Visit | Attending: Internal Medicine | Admitting: Internal Medicine

## 2019-07-07 ENCOUNTER — Ambulatory Visit: Payer: Medicare HMO

## 2019-07-07 DIAGNOSIS — R928 Other abnormal and inconclusive findings on diagnostic imaging of breast: Secondary | ICD-10-CM

## 2019-08-10 ENCOUNTER — Other Ambulatory Visit: Payer: Self-pay

## 2019-08-10 ENCOUNTER — Ambulatory Visit (INDEPENDENT_AMBULATORY_CARE_PROVIDER_SITE_OTHER): Payer: Medicare HMO | Admitting: Podiatry

## 2019-08-10 ENCOUNTER — Ambulatory Visit: Payer: Medicare HMO | Admitting: Adult Health

## 2019-08-10 ENCOUNTER — Ambulatory Visit: Payer: Medicare HMO

## 2019-08-10 DIAGNOSIS — M722 Plantar fascial fibromatosis: Secondary | ICD-10-CM | POA: Diagnosis not present

## 2019-08-10 MED ORDER — MELOXICAM 15 MG PO TABS: 15.0000 mg | ORAL_TABLET | Freq: Every day | ORAL | 1 refills | Status: DC

## 2019-08-13 ENCOUNTER — Other Ambulatory Visit: Payer: Self-pay | Admitting: Internal Medicine

## 2019-08-13 DIAGNOSIS — E2839 Other primary ovarian failure: Secondary | ICD-10-CM

## 2019-08-13 NOTE — Progress Notes (Signed)
   Subjective: 69 y.o. female presenting today for follow up evaluation of plantar fasciitis of the left foot. She reports some continued intermittent throbbing pain. She has been doing at home exercises and taking Meloxicam for treatment. There are no worsening factors noted. Patient is here for further evaluation and treatment.    Past Medical History:  Diagnosis Date  . Anemia 2001   after gastric bypass  . Anxiety   . Arthritis   . Breast cancer ([place]) 2013   Breast cancer    Radiation therapy  . COPD (chronic obstructive pulmonary disease) ([place])   . Depression   . Fibromyalgia   . GERD (gastroesophageal reflux disease)   . Hypertension   . Personal history of radiation therapy 2013  . Pneumonia   . Pre-diabetes   . PTSD (post-traumatic stress disorder)   . Sleep apnea    uses CPAP     Objective: Physical Exam General: The patient is alert and oriented x3 in no acute distress.  Dermatology: Skin is warm, dry and supple bilateral lower extremities. Negative for open lesions or macerations bilateral.   Vascular: Dorsalis Pedis and Posterior Tibial pulses palpable bilateral.  Capillary fill time is immediate to all digits.  Neurological: Epicritic and protective threshold intact bilateral.   Musculoskeletal: Tenderness to palpation to the plantar aspect of the left heel along the plantar fascia. All other joints range of motion within normal limits bilateral. Strength 5/5 in all groups bilateral.   Assessment: 1. Plantar fasciitis left foot - 80% improved   Plan of Care:  1. Patient evaluated.  2. Declined injection.  3. Refill prescription for Meloxicam provided to patient.  4. Recommended good OTC insoles.  5. Return to clinic as needed.     Edrick Kins, DPM [facility]  Dr. Edrick Kins, DPM    [address], [place] [address]                Office [phone]  Fax [phone]

## 2019-09-21 ENCOUNTER — Other Ambulatory Visit (HOSPITAL_COMMUNITY): Payer: Medicare HMO

## 2019-09-24 ENCOUNTER — Ambulatory Visit: Payer: Medicare HMO | Admitting: Primary Care

## 2019-09-24 ENCOUNTER — Ambulatory Visit: Payer: Medicare HMO | Admitting: Adult Health

## 2019-10-02 ENCOUNTER — Other Ambulatory Visit: Payer: Self-pay | Admitting: Pulmonary Disease

## 2019-10-02 ENCOUNTER — Other Ambulatory Visit: Payer: Self-pay

## 2019-10-02 ENCOUNTER — Encounter: Payer: Self-pay | Admitting: Orthopaedic Surgery

## 2019-10-02 ENCOUNTER — Other Ambulatory Visit (HOSPITAL_COMMUNITY)
Admission: RE | Admit: 2019-10-02 | Discharge: 2019-10-02 | Disposition: A | Discharge status: Home / Self Care | Payer: Medicare HMO | Source: Ambulatory Visit | Attending: Pulmonary Disease | Admitting: Pulmonary Disease

## 2019-10-02 ENCOUNTER — Ambulatory Visit (INDEPENDENT_AMBULATORY_CARE_PROVIDER_SITE_OTHER): Payer: Medicare HMO | Admitting: Orthopaedic Surgery

## 2019-10-02 VITALS — Ht 66.0 in | Wt 204.0 lb

## 2019-10-02 DIAGNOSIS — Z96652 Presence of left artificial knee joint: Secondary | ICD-10-CM

## 2019-10-02 DIAGNOSIS — Z01812 Encounter for preprocedural laboratory examination: Secondary | ICD-10-CM | POA: Diagnosis present

## 2019-10-02 DIAGNOSIS — Z20822 Contact with and (suspected) exposure to covid-19: Secondary | ICD-10-CM | POA: Diagnosis not present

## 2019-10-02 DIAGNOSIS — R0602 Shortness of breath: Secondary | ICD-10-CM

## 2019-10-02 LAB — SARS CORONAVIRUS 2 (TAT 6-24 HRS): SARS Coronavirus 2: NEGATIVE

## 2019-10-02 NOTE — Progress Notes (Signed)
Post-Op Visit Note   Patient: Rachel Woodard           Date of Birth: 04/29/1951           MRN: [id number] Visit Date: 10/02/2019 PCP: Rocco Serene, MD   Assessment & Plan:  Chief Complaint:  Chief Complaint  Patient presents with  . Left Knee - Follow-up    Left TKA DOS 12/22/2018   Visit Diagnoses:  1. Hx of total knee replacement, left     Plan: Patient is a pleasant 69 year old female who comes in today 9 months out left total knee replacement 12/22/2018.  She has been doing very well.  She has been working on a home exercise program making good progress.  Examination of the left knee reveals a fully healed surgical scar without complication.  Range of motion 0 to 110 degrees.  Stable to valgus varus stress.  She is neurovascular intact distally.  At this point, she will continue to work on a home exercise program.  Dental prophylaxis reinforced.  Follow-up with [place] when she is 2 years out from surgery for AP and lateral x-rays of the left knee.  Call with concerns or questions in the meantime.  Follow-Up Instructions: Return in about 1 year (around 10/01/2020).   Orders:  No orders of the defined types were placed in this encounter.  No orders of the defined types were placed in this encounter.   Imaging: No new imaging  PMFS History: Patient Active Problem List   Diagnosis Date Noted  . Status post total left knee replacement 12/22/2018  . MDD (major depressive disorder), recurrent episode, mild ([place]) 10/29/2018  . Autoimmune hepatitis ([place]) 10/29/2018  . OSA (obstructive sleep apnea) 10/29/2018  . Renal cyst, right 10/22/2018  . Lung nodule, solitary 10/22/2018  . Genital herpes simplex 10/29/2017  . Vitamin D deficiency 10/29/2017  . Class 2 obesity due to excess calories without serious comorbidity with body mass index (BMI) of 38.0 to 38.9 in adult 10/29/2017  . Essential hypertension 10/29/2017  . Pre-diabetes 02/14/2017  . Fibromyalgia 02/14/2017  .  Substance abuse in remission ([place]) 02/14/2017  . Anxiety and depression 02/14/2017  . HX: breast cancer 02/14/2017  . Urge incontinence 11/13/2016  . Unilateral primary osteoarthritis, left knee 06/14/2016  . PMB (postmenopausal bleeding) 04/02/2016  . GERD (gastroesophageal reflux disease) 11/02/2015  . Angioedema 10/24/2013  . Tobacco abuse 10/24/2013  . DCIS (ductal carcinoma in situ) of breast 04/07/2013  . Depression 04/07/2013   Past Medical History:  Diagnosis Date  . Anemia 2001   after gastric bypass  . Anxiety   . Arthritis   . Breast cancer ([place]) 2013   Breast cancer    Radiation therapy  . COPD (chronic obstructive pulmonary disease) ([employer])   . Depression   . Fibromyalgia   . GERD (gastroesophageal reflux disease)   . Hypertension   . Personal history of radiation therapy 2013  . Pneumonia   . Pre-diabetes   . PTSD (post-traumatic stress disorder)   . Sleep apnea    uses CPAP    History reviewed. No pertinent family history.  Past Surgical History:  Procedure Laterality Date  . ABDOMINAL HYSTERECTOMY  2018  . BREAST LUMPECTOMY Right 2013  . GASTRIC BYPASS  2001  . TOTAL KNEE ARTHROPLASTY Left 12/22/2018   Procedure: LEFT TOTAL KNEE ARTHROPLASTY;  Surgeon: Leandrew Koyanagi, MD;  Location: [facility];  Service: Orthopedics;  Laterality: Left;   Social History   Occupational History  .  Not on file  Tobacco Use  . Smoking status: Current Every Day Smoker    Packs/day: 0.50    Types: Cigarettes  . Smokeless tobacco: Never Used  Substance and Sexual Activity  . Alcohol use: Yes    Comment: seldom  . Drug use: No    Types: Cocaine    Comment: clean x 60 days on 11/28/15  . Sexual activity: Yes

## 2019-10-05 ENCOUNTER — Ambulatory Visit (INDEPENDENT_AMBULATORY_CARE_PROVIDER_SITE_OTHER): Payer: Medicare HMO | Admitting: Pulmonary Disease

## 2019-10-05 ENCOUNTER — Encounter: Payer: Self-pay | Admitting: Pulmonary Disease

## 2019-10-05 ENCOUNTER — Other Ambulatory Visit: Payer: Self-pay

## 2019-10-05 VITALS — BP 120/70 | HR 59 | Temp 97.7°F | Ht 66.5 in | Wt 217.0 lb

## 2019-10-05 DIAGNOSIS — Z720 Tobacco use: Secondary | ICD-10-CM | POA: Diagnosis not present

## 2019-10-05 DIAGNOSIS — G4733 Obstructive sleep apnea (adult) (pediatric): Secondary | ICD-10-CM

## 2019-10-05 DIAGNOSIS — R0602 Shortness of breath: Secondary | ICD-10-CM

## 2019-10-05 LAB — PULMONARY FUNCTION TEST
DL/VA % pred: 100 %
DL/VA: 4.09 ml/min/mmHg/L
DLCO cor % pred: 82 %
DLCO cor: 17.7 ml/min/mmHg
DLCO unc % pred: 82 %
DLCO unc: 17.7 ml/min/mmHg
FEF 25-75 Post: 2.75 L/sec
FEF 25-75 Pre: 2.31 L/sec
FEF2575-%Change-Post: 19 %
FEF2575-%Pred-Post: 135 %
FEF2575-%Pred-Pre: 114 %
FEV1-%Change-Post: 3 %
FEV1-%Pred-Post: 106 %
FEV1-%Pred-Pre: 103 %
FEV1-Post: 2.32 L
FEV1-Pre: 2.24 L
FEV1FVC-%Change-Post: 1 %
FEV1FVC-%Pred-Pre: 104 %
FEV6-%Change-Post: 2 %
FEV6-%Pred-Post: 104 %
FEV6-%Pred-Pre: 101 %
FEV6-Post: 2.8 L
FEV6-Pre: 2.73 L
FEV6FVC-%Change-Post: 0 %
FEV6FVC-%Pred-Post: 103 %
FEV6FVC-%Pred-Pre: 103 %
FVC-%Change-Post: 2 %
FVC-%Pred-Post: 100 %
FVC-%Pred-Pre: 98 %
FVC-Post: 2.8 L
FVC-Pre: 2.74 L
Post FEV1/FVC ratio: 83 %
Post FEV6/FVC ratio: 100 %
Pre FEV1/FVC ratio: 82 %
Pre FEV6/FVC Ratio: 100 %
RV % pred: 94 %
RV: 2.1 L
TLC % pred: 91 %
TLC: 4.98 L

## 2019-10-05 NOTE — Patient Instructions (Addendum)
You were seen today by Lauraine Rinne, NP  for:   1. OSA (obstructive sleep apnea)  We recommend that you RESUME using your CPAP daily >>>Keep up the hard work using your device >>> Goal should be wearing this for the entire night that you are sleeping, at least 4 to 6 hours  Remember:  . Do not drive or operate heavy machinery if tired or drowsy.  . Please notify the supply company and office if you are unable to use your device regularly due to missing supplies or machine being broken.  . Work on maintaining a healthy weight and following your recommended nutrition plan  . Maintain proper daily exercise and movement  . Maintaining proper use of your device can also help improve management of other chronic illnesses such as: Blood pressure, blood sugars, and weight management.   BiPAP/ CPAP Cleaning:  >>>Clean weekly, with Dawn soap, and bottle brush.  Set up to air dry. >>> Wipe mask out daily with wet wipe or towelette    2. Tobacco abuse  Please present to our office in 1-2 weeks for an appointment with the clinical pharmacy team for:  . Smoking cessation . Medication Management  . Medication reconciliation   We recommend that you stop smoking.  >>>You need to set a quit date >>>If you have friends or family who smoke, let them know you are trying to quit and not to smoke around you or in your living environment  Smoking Cessation Resources:  1 800 QUIT NOW  >>> Patient to call this resource and utilize it to help support her quit smoking >>> Keep up your hard work with stopping smoking  You can also contact the [facility] >>>For smoking cessation classes call [phone]  We do not recommend using e-cigarettes as a form of stopping smoking  You can sign up for smoking cessation support texts and information:  >>>https://smokefree.gov/smokefreetxt     Follow Up:    Return in about 6 weeks (around 11/16/2019), or if symptoms worsen or fail to improve,  for Follow up with Dr. Ander Slade.  Can schedule earlier if needed   Please present to our office in 1-2 weeks for an appointment with the clinical pharmacy team for:  . Smoking cessation . Medication Management  . Medication reconciliation   Please do your part to reduce the spread of COVID-19:      Reduce your risk of any infection  and COVID19 by using the similar precautions used for avoiding the common cold or flu:  Marland Kitchen Wash your hands often with soap and warm water for at least 20 seconds.  If soap and water are not readily available, use an alcohol-based hand sanitizer with at least 60% alcohol.  . If coughing or sneezing, cover your mouth and nose by coughing or sneezing into the elbow areas of your shirt or coat, into a tissue or into your sleeve (not your hands). Langley Gauss A MASK when in public  . Avoid shaking hands with others and consider head nods or verbal greetings only. . Avoid touching your eyes, nose, or mouth with unwashed hands.  . Avoid close contact with people who are sick. . Avoid places or events with large numbers of people in one location, like concerts or sporting events. . If you have some symptoms but not all symptoms, continue to monitor at home and seek medical attention if your symptoms worsen. . If you are having a medical emergency, call 911.  [place] / e-Visit: [email or web address]         [facility]: [facility]: [phone]                   [facility]: [phone]     It is flu season:   >>> Best ways to protect herself from the flu: Receive the yearly flu vaccine, practice good hand hygiene washing with soap and also using hand sanitizer when available, eat a nutritious meals, get adequate rest, hydrate appropriately   Please contact the office if your symptoms worsen or you have  concerns that you are not improving.   Thank you for choosing [facility] for your healthcare, and for allowing [place] to partner with you on your healthcare journey. I am thankful to be able to provide care to you today.   Wyn Quaker FNP-C

## 2019-10-05 NOTE — Assessment & Plan Note (Signed)
Pulmonary function test today reviewed with patient Current smoker  Plan: Continue to clinically monitor Chest x-ray today Emphasized importance of stopping smoking

## 2019-10-05 NOTE — Progress Notes (Signed)
PFT done today. 

## 2019-10-05 NOTE — Progress Notes (Signed)
@Patient  ID: Rachel Woodard, female    DOB: 11/26/1950, 69 y.o.   MRN: [id number]  Chief Complaint  Patient presents with  . Follow-up    Patient is here for ROV after PFT. Patient has a cough that comes and goes with yellow sputum. Patient feels like breathing is good at this time.     Referring provider: Rocco Serene, MD  HPI:  69 year old female current everyday smoker followed in our office for shortness of breath and obstructive sleep apnea  PMH: Prediabetes, fibromyalgia, history of substance abuse, anxiety and depression, GERD Smoker/ Smoking History: Current Smoker. 0.75 ppd. 15 pack year Maintenance:  None  Pt of: Dr. Ander Slade  10/05/2019  - Visit   69 year old female current smoker initially consulted with our practice on 07/02/2018 with a history of COPD and previous diagnosis of obstructive sleep apnea.  At that office visit it was recommended that she stop smoking.  A pulmonary function test was ordered.  Chest x-ray was ordered as well as a sleep study.  She is also given a prescription of Levaquin for 5 days as well as prednisone.  She was requested to follow-up in 3 months.  February/2020 telephone note shows patient's obstructive sleep apnea showed mild obstructive sleep apnea.  With recommendations to start CPAP therapy 5-15.  Patient presenting back to our office today after completing pulmonary function testing.  Those results are listed below:   10/05/2019-pulmonary function test-FVC 2.74 (98% predicted), postbronchodilator ratio 83, postbronchodilator FEV1 2.32 (106% predicted), no bronchodilator response, DLCO 17.7 (82% predicted)  Patient reporting today that she contracted COVID-19 in November/2020.  She this was managed outpatient.  She never required hospitalization and did not receive the monoclonal antibody infusion.  Overall she has been doing okay since last being seen.  She reports that she is still currently smoking.  She is interested in stopping  smoking.  We will discuss this today.  Patient was started on CPAP therapy.  Her CPAP compliance report shows poor compliance.  She reports that she had difficulty using her CPAP due to her living situation which is now improved.  She reports that she would be able to resume using it today.  Questionaires / Pulmonary Flowsheets:   MMRC: mMRC Dyspnea Scale mMRC Score  10/05/2019 3    Tests:   10/05/2019-pulmonary function test-FVC 2.74 (98% predicted), postbronchodilator ratio 83, postbronchodilator FEV1 2.32 (106% predicted), no bronchodilator response, DLCO 17.7 (82% predicted)  07/02/2018-chest x-ray-right lower lobe pneumonia  07/31/2018-home sleep study-AHI 11.3, lowest SaO2 62%  FENO:  No results found for: NITRICOXIDE  PFT: PFT Results Latest Ref Rng & Units 10/05/2019  FVC-Pre L 2.74  FVC-Predicted Pre % 98  FVC-Post L 2.80  FVC-Predicted Post % 100  Pre FEV1/FVC % % 82  Post FEV1/FCV % % 83  FEV1-Pre L 2.24  FEV1-Predicted Pre % 103  FEV1-Post L 2.32  DLCO UNC% % 82  DLCO COR %Predicted % 100  TLC L 4.98  TLC % Predicted % 91  RV % Predicted % 94    WALK:  No flowsheet data found.  Imaging: No results found.  Lab Results:  CBC    Component Value Date/Time   WBC 8.7 12/23/2018 0521   RBC 4.34 12/23/2018 0521   HGB 12.1 12/23/2018 0521   HGB 14.8 01/22/2018 1443   HCT 37.9 12/23/2018 0521   HCT 45.7 01/22/2018 1443   PLT 187 12/23/2018 0521   PLT 231 01/22/2018 1443   MCV 87.3 12/23/2018 0521  MCV 87 01/22/2018 1443   MCH 27.9 12/23/2018 0521   MCHC 31.9 12/23/2018 0521   RDW 14.2 12/23/2018 0521   RDW 14.5 01/22/2018 1443   LYMPHSABS 2.4 12/16/2018 1320   LYMPHSABS 2.7 01/22/2018 1443   MONOABS 0.5 12/16/2018 1320   EOSABS 0.1 12/16/2018 1320   EOSABS 0.2 01/22/2018 1443   BASOSABS 0.1 12/16/2018 1320   BASOSABS 0.0 01/22/2018 1443    BMET    Component Value Date/Time   NA 134 (L) 12/23/2018 0521   NA 140 11/11/2018 1050   K 4.2  12/23/2018 0521   CL 101 12/23/2018 0521   CO2 25 12/23/2018 0521   GLUCOSE 115 (H) 12/23/2018 0521   BUN 17 12/23/2018 0521   BUN 14 11/11/2018 1050   CREATININE 1.36 (H) 12/23/2018 0521   CREATININE 0.95 08/08/2016 1102   CALCIUM 8.2 (L) 12/23/2018 0521   GFRNONAA 41 (L) 12/23/2018 0521   GFRAA 47 (L) 12/23/2018 0521    BNP    Component Value Date/Time   BNP 16.3 11/11/2018 1050    ProBNP No results found for: PROBNP  Specialty Problems      Pulmonary Problems   Lung nodule, solitary    RLL 1.4 cm reportedly seen on CT done by urology Dr. Tresa Moore to eval RT kidney cyst 09/2018.  Pt reported that she has appt schedule with [facility] for eval of this lung nodule      OSA (obstructive sleep apnea)   Shortness of breath      Allergies  Allergen Reactions  . Klonopin [Clonazepam] Anaphylaxis  . Lisinopril Anaphylaxis  . Metformin And Related Diarrhea    Immunization History  Administered Date(s) Administered  . Influenza, Seasonal, Injecte, Preservative Fre 06/01/2013  . Influenza,inj,Quad PF,6+ Mos 08/08/2016, 07/11/2017, 04/24/2018  . Moderna SARS-COVID-2 Vaccination 08/28/2019, 10/01/2019  . Pneumococcal Polysaccharide-23 12/21/2014, 08/08/2016  . Tdap 06/01/2013, 08/08/2016   Moderna vaccine - received both doses   Past Medical History:  Diagnosis Date  . Anemia 2001   after gastric bypass  . Anxiety   . Arthritis   . Breast cancer ([place]) 2013   Breast cancer    Radiation therapy  . COPD (chronic obstructive pulmonary disease) ([place])   . Depression   . Fibromyalgia   . GERD (gastroesophageal reflux disease)   . Hypertension   . Personal history of radiation therapy 2013  . Pneumonia   . Pre-diabetes   . PTSD (post-traumatic stress disorder)   . Sleep apnea    uses CPAP    Tobacco History: Social History   Tobacco Use  Smoking Status Current Every Day Smoker  . Packs/day: 0.75  . Years: 20.00  . Pack years: 15.00  . Types: Cigarettes  . Start  date: 06/26/1999  Smokeless Tobacco Never Used   Ready to quit: No Counseling given: Yes  Smoking assessment and cessation counseling  Patient currently smoking: 0.75 ppd  I have advised the patient to quit/stop smoking as soon as possible due to high risk for multiple medical problems.  It will also be very difficult for [place] to manage patient's  respiratory symptoms and status if we continue to expose her lungs to a known irritant.  We do not advise e-cigarettes as a form of stopping smoking.  Patient is willing to quit smoking. Hasnt set quit date.   Patient is interested in nicotine replacement therapies.  She currently has nicotine gum at home.  We reviewed how to utilize that.  She is interested in nicotine  patches.  She is also willing to discuss this with our pharmacy team.  I have advised the patient that we can assist and have options of nicotine replacement therapy, provided smoking cessation education today, provided smoking cessation counseling, and provided cessation resources.  Follow-up next office visit office visit for assessment of smoking cessation.  Smoking cessation counseling advised for: 8 min     Outpatient Encounter Medications as of 10/05/2019  Medication Sig  . Blood Pressure Monitor DEVI Use as directed to check home blood pressure 2-3 times a week  . carvedilol (COREG) 3.125 MG tablet TAKE 1 TABLET (3.125 MG TOTAL) BY MOUTH 2 (TWO) TIMES DAILY WITH A MEAL.  . cholecalciferol (VITAMIN D3) 25 MCG (1000 UNIT) tablet Take 1,000 Units by mouth daily.  . hydrochlorothiazide (HYDRODIURIL) 25 MG tablet TAKE 1 TABLET (25 MG TOTAL) BY MOUTH DAILY.  Marland Kitchen lamoTRIgine (LAMICTAL) 100 MG tablet Take 100 mg by mouth daily.  . pantoprazole (PROTONIX) 40 MG tablet Take 40 mg by mouth every morning.  Marland Kitchen QUEtiapine (SEROQUEL) 50 MG tablet Take 25 mg by mouth at bedtime as needed (sleep).   . valACYclovir (VALTREX) 500 MG tablet Take 1 tablet (500 mg total) by mouth 2 (two) times  daily.  . vitamin C (ASCORBIC ACID) 500 MG tablet Take 500 mg by mouth daily.  . [DISCONTINUED] amLODipine (NORVASC) 5 MG tablet TAKE 1 TABLET (5 MG TOTAL) BY MOUTH DAILY.  . [DISCONTINUED] amoxicillin (AMOXIL) 500 MG capsule Take 4 tabs po one hour prior to procedure  . [DISCONTINUED] aspirin EC 81 MG tablet Take 1 tablet (81 mg total) by mouth 2 (two) times daily.  . [DISCONTINUED] clonazePAM (KLONOPIN) 2 MG tablet   . [DISCONTINUED] loratadine (CLARITIN) 10 MG tablet Take 10 mg by mouth daily.  . [DISCONTINUED] meloxicam (MOBIC) 15 MG tablet Take 1 tablet (15 mg total) by mouth daily.  . [DISCONTINUED] methocarbamol (ROBAXIN) 750 MG tablet Take 1 tablet (750 mg total) by mouth 2 (two) times daily as needed for muscle spasms.  . [DISCONTINUED] ondansetron (ZOFRAN) 4 MG tablet Take 1-2 tablets (4-8 mg total) by mouth every 8 (eight) hours as needed for nausea or vomiting.  . [DISCONTINUED] oxyCODONE (OXY IR/ROXICODONE) 5 MG immediate release tablet Take 1-3 tablets (5-15 mg total) by mouth every 6 (six) hours as needed for severe pain.  . [DISCONTINUED] promethazine (PHENERGAN) 25 MG tablet Take 1 tablet (25 mg total) by mouth every 6 (six) hours as needed for nausea.  . [DISCONTINUED] senna-docusate (SENOKOT S) 8.6-50 MG tablet Take 1-2 tablets by mouth at bedtime as needed.  . [DISCONTINUED] sulfamethoxazole-trimethoprim (BACTRIM DS) 800-160 MG tablet Take 1 tablet by mouth 2 (two) times daily.   No facility-administered encounter medications on file as of 10/05/2019.     Review of Systems  Review of Systems  Constitutional: Positive for fatigue. Negative for activity change and fever.  HENT: Negative for sinus pressure, sinus pain and sore throat.   Respiratory: Negative for cough, shortness of breath and wheezing.   Cardiovascular: Negative for chest pain and palpitations.  Gastrointestinal: Negative for diarrhea, nausea and vomiting.  Musculoskeletal: Negative for arthralgias.    Neurological: Negative for dizziness.  Psychiatric/Behavioral: Positive for sleep disturbance. The patient is not nervous/anxious.      Physical Exam  BP 120/70 (BP Location: Left Arm, Patient Position: Sitting, Cuff Size: Large)   Pulse (!) 59   Temp 97.7 F (36.5 C) (Temporal)   Ht 5' 6.5" (1.689 m)   Wt 217  lb (98.4 kg)   SpO2 97%   BMI 34.50 kg/m   Wt Readings from Last 5 Encounters:  10/05/19 217 lb (98.4 kg)  10/02/19 204 lb (92.5 kg)  12/22/18 235 lb 8 oz (106.8 kg)  12/16/18 235 lb 8 oz (106.8 kg)  11/11/18 241 lb 6.4 oz (109.5 kg)    BMI Readings from Last 5 Encounters:  10/05/19 34.50 kg/m  10/02/19 32.93 kg/m  12/22/18 38.01 kg/m  12/16/18 38.01 kg/m  11/11/18 39.86 kg/m     Physical Exam Vitals and nursing note reviewed.  Constitutional:      General: She is not in acute distress.    Appearance: Normal appearance. She is obese.  HENT:     Head: Normocephalic and atraumatic.     Right Ear: Tympanic membrane, ear canal and external ear normal. There is no impacted cerumen.     Left Ear: Tympanic membrane, ear canal and external ear normal. There is no impacted cerumen.     Nose: Nose normal. No congestion.     Mouth/Throat:     Mouth: Mucous membranes are moist.     Pharynx: Oropharynx is clear.  Eyes:     Pupils: Pupils are equal, round, and reactive to light.  Cardiovascular:     Rate and Rhythm: Normal rate and regular rhythm.     Pulses: Normal pulses.     Heart sounds: Normal heart sounds. No murmur.  Pulmonary:     Breath sounds: Normal breath sounds. No decreased air movement. No decreased breath sounds, wheezing or rales.  Musculoskeletal:     Cervical back: Normal range of motion.  Skin:    General: Skin is warm and dry.     Capillary Refill: Capillary refill takes less than 2 seconds.  Neurological:     General: No focal deficit present.     Mental Status: She is alert and oriented to person, place, and time. Mental status is at  baseline.     Gait: Gait normal.  Psychiatric:        Mood and Affect: Mood normal.        Behavior: Behavior normal.        Thought Content: Thought content normal.        Judgment: Judgment normal.       Assessment & Plan:   OSA (obstructive sleep apnea) Mild OSA on home sleep study Poor CPAP compliance Patient reports external stressors as well as home living environment affecting her CPAP compliance which is now been resolved She is willing to resume use  Plan: Resume CPAP Follow-up in 2 to 3 months with Dr. Jenetta Downer  Tobacco abuse Current smoker History of substance abuse 15-pack-year smoking history Interested in stopping smoking  Plan: Clinical pharmacy team appointment to be scheduled today Patient likely would be a good candidate for nicotine replacement therapy and Wellbutrin.  Patient with history of vivid dreams from other medications.  May not be adequate candidate for Chantix.  Shortness of breath Pulmonary function test today reviewed with patient Current smoker  Plan: Continue to clinically monitor Chest x-ray today Emphasized importance of stopping smoking    Return in about 6 weeks (around 11/16/2019), or if symptoms worsen or fail to improve, for Follow up with Dr. Ander Slade.   Lauraine Rinne, NP 10/05/2019   This appointment required 34 minutes of patient care (this includes precharting, chart review, review of results, face-to-face care, etc.).

## 2019-10-05 NOTE — Assessment & Plan Note (Signed)
Current smoker History of substance abuse 15-pack-year smoking history Interested in stopping smoking  Plan: Clinical pharmacy team appointment to be scheduled today Patient likely would be a good candidate for nicotine replacement therapy and Wellbutrin.  Patient with history of vivid dreams from other medications.  May not be adequate candidate for Chantix.

## 2019-10-05 NOTE — Assessment & Plan Note (Signed)
Mild OSA on home sleep study Poor CPAP compliance Patient reports external stressors as well as home living environment affecting her CPAP compliance which is now been resolved She is willing to resume use  Plan: Resume CPAP Follow-up in 2 to 3 months with Dr. Jenetta Downer

## 2019-10-06 NOTE — Progress Notes (Signed)
Subjective Patient presents to [facility] and seen by the pharmacist for smoking cessation counseling.    Social History   Tobacco Use  Smoking Status Current Every Day Smoker  . Packs/day: 0.75  . Years: 20.00  . Pack years: 15.00  . Types: Cigarettes  . Start date: 06/26/1999  Smokeless Tobacco Never Used     Tobacco Use History  Age when started using tobacco on a daily basis 9.  Type: cigarettes.  Number of cigarettes per day half to 3/4 pack a day, brand  Marbolomenthol.  Smokes first cigarette 30 minutes after waking.  Does not wake at night to smoke  Triggers include drinking a cup of coffee, after a meal, and watching tv.  Quit Attempt History   Most recent quit attempt years.  Longest time ever been tobacco free only during hospital stay.  Methods tried in the past include nicotine gum and lozenges.   Rates IMPORTANCE of quitting tobacco on 1-10 scale of 8.  Rates READINESS of quitting tobacco on 1-10 scale of 6.  Rates CONFIDENCE of quitting tobacco on 1-10 scale of 6.  Motivators to quitting include health and family; barriers include being idle    Immunization History  Administered Date(s) Administered  . Influenza, Seasonal, Injecte, Preservative Fre 06/01/2013  . Influenza,inj,Quad PF,6+ Mos 08/08/2016, 07/11/2017, 04/24/2018  . Moderna SARS-COVID-2 Vaccination 08/28/2019, 10/01/2019  . Pneumococcal Polysaccharide-23 12/21/2014, 08/08/2016  . Tdap 06/01/2013, 08/08/2016     Assessment and Plan  1. Smoking Cessation   Patient states they are ready to quit smoking.   Reviewed STAR quit plan and smoking cessation agents Chantix, Wellbutrin, and nicotine replacement therapy.Patient set quit date of 10/24/2019. Patient is agreeable to trying nicotine patch, Chantix, and nicotine gum as needed.  Nicotine Patch  Patient counseled on purpose, proper use, and potential adverse effects, including mild itching or redness at the point of  application, headache, trouble sleeping, and/or vivid dreams    Patch Schedule for >10 cigarettes daily Weeks 1-6: one 21 mg patch daily Weeks 7-8: one 14 mg patch daily Weeks 9-10: one 7 mg patch daily  Nicotine Gum Patient counseled on purpose, proper use, and potential adverse effects including jaw soreness and upset stomach if swallowing saliva.  Instructed patient to use 4 mg if they smoke a pack a day or more and 2 mg if they smoke less than a pack a day.  Gum dosing schedule Weeks 1 to 6: Chew 1 piece of gum every 1 to 2 hours (maximum: 24 pieces/day); to increase chances of quitting, chew at least 9 pieces/day during the first 6 weeks Weeks 7 to 9: Chew 1 piece of gum every 2 to 4 hours (maximum: 24 pieces/day) Weeks 10 to 12: Chew 1 piece of gum every 4 to 8 hours (maximum: 24 pieces/day)  Varenicline Initiated varenicline titration of 0.5 mg by mouth once daily with food x3 days, then 0.5 mg by mouth twice daily with food x4 days, then 1 mg by mouth twice daily with food thereafter.  CrCL greater than 30 mL/min. Patient counseled on purpose, proper use, and potential adverse effects, including GI upset, and potential change in mood.   Provided information on 1 800-QUIT NOW support program.   Schedule follow up televist for 11/03/19.  2. Immunizations Patient is up to date with Pneumovax 23 and Covid 19 vaccines.  All question encouraged and answered.  Instructed patient to reach out with any further questions or concerns.  Thank you for allowing  pharmacy to participate in this patient's care.  This appointment required 40 minutes of patient care (this includes precharting, chart review, review of results, face-to-face care, etc.).  Mariella Saa, PharmD, [place], [place] Clinical Specialty Pharmacist [phone]  10/08/2019 2:09 PM

## 2019-10-08 ENCOUNTER — Other Ambulatory Visit: Payer: Self-pay

## 2019-10-08 ENCOUNTER — Ambulatory Visit (INDEPENDENT_AMBULATORY_CARE_PROVIDER_SITE_OTHER): Payer: Medicare HMO | Admitting: Pharmacist

## 2019-10-08 DIAGNOSIS — Z716 Tobacco abuse counseling: Secondary | ICD-10-CM

## 2019-10-08 DIAGNOSIS — F17210 Nicotine dependence, cigarettes, uncomplicated: Secondary | ICD-10-CM

## 2019-10-08 DIAGNOSIS — Z720 Tobacco use: Secondary | ICD-10-CM

## 2019-10-08 MED ORDER — VARENICLINE TARTRATE 0.5 MG X 11 & 1 MG X 42 PO MISC: ORAL | 0 refills | Status: DC

## 2019-10-08 MED ORDER — NICOTINE 21 MG/24HR TD PT24: 21.0000 mg | MEDICATED_PATCH | Freq: Every day | TRANSDERMAL | 1 refills | Status: DC

## 2019-10-08 NOTE — Patient Instructions (Addendum)
Reviewed STAR Quit Plan.   S-set quit date: 10/24/2019 T-tell everyone: friends, family, life skills group A-anticipate challenges: after meals, cup of coffee, and idleness. Patient picked alternative activities to try instead of smoking a cigarette 1) drink a glass of water 2) walk outside 3) journal 4) color 5) cook R- remove all tobacco products: patient will trash cigarettes, lighters, and ash trays by 10/23/19  Nicotine Replacement Therapy  Nicotine patch   Start the day of your quit date. Use daily for 24 hours.   If it keeps you up at night you can wear for 12 hours.   Weeks 1 to 6: one nicotine patch (14 mg) daily. I will call and re-assess how you are doing at the end of 6 weeks to see how you are doing on 14 mg patch and if you are ready to decrease dose of patch.   Weeks 7-8: one nicotine patch (7 mg) daily    Nicotine Gum  Weeks 1 to 6: Chew 1 piece of gum every 1 to 2 hours (maximum: 24 pieces/day); to increase chances of quitting, chew at least 9 pieces/day during the first 6 weeks  Weeks 7 to 9: Chew 1 piece of gum every 2 to 4 hours (maximum: 24 pieces/day)  Weeks 10 to 12: Chew 1 piece of gum every 4 to 8 hours (maximum: 24 pieces/day)  Chantix  Start ONE WEEK before quit date  Follow taper directions on pack

## 2019-10-21 ENCOUNTER — Telehealth: Payer: Self-pay | Admitting: Pulmonary Disease

## 2019-10-21 DIAGNOSIS — Z720 Tobacco use: Secondary | ICD-10-CM

## 2019-10-21 MED ORDER — VARENICLINE TARTRATE 1 MG PO TABS: 1.0000 mg | ORAL_TABLET | Freq: Two times a day (BID) | ORAL | 2 refills | Status: DC

## 2019-10-21 NOTE — Telephone Encounter (Signed)
Prescription for Chantix continuing month pack with 2 refills.  Nothing further needed.  Mariella Saa, PharmD, [place], [place] Clinical Specialty Pharmacist [phone]  10/21/2019 2:47 PM

## 2019-10-22 ENCOUNTER — Encounter: Payer: Self-pay | Admitting: Pulmonary Disease

## 2019-10-22 ENCOUNTER — Other Ambulatory Visit: Payer: Self-pay

## 2019-10-22 ENCOUNTER — Other Ambulatory Visit: Payer: Medicare HMO

## 2019-10-22 ENCOUNTER — Ambulatory Visit (INDEPENDENT_AMBULATORY_CARE_PROVIDER_SITE_OTHER): Payer: Medicare HMO | Admitting: Pulmonary Disease

## 2019-10-22 DIAGNOSIS — Z720 Tobacco use: Secondary | ICD-10-CM

## 2019-10-22 MED ORDER — VARENICLINE TARTRATE 0.5 MG X 11 & 1 MG X 42 PO MISC: ORAL | 0 refills | Status: DC

## 2019-10-22 NOTE — Progress Notes (Signed)
Rachel Woodard    [id number]    1951/02/02  Primary Care Physician:Lamb, Vianne Bulls, MD  Referring Physician: Rocco Serene, [place],  [place] [address]  Chief complaint:   History of obstructive sleep apnea History of chronic obstructive pulmonary disease  HPI: Working on quitting smoking Shortness of breath has been relatively stable  Sleep has been relatively stable Found to have obstructive sleep apnea and has recently started using CPAP Occasional cough with minimal phlegm production  Had a sleep study in 2018 showing moderate obstructive sleep apnea Was not started on any treatment then Repeat sleep study does confirm obstructive sleep-mild obstructive sleep apnea February 2020  Active smoker-she is expecting a nicotine patches and is willing and planning on quitting  She does have shortness of breath, wheezing, no chest pain or discomfort  No pertinent occupational history  Outpatient Encounter Medications as of 10/22/2019  Medication Sig  . Blood Pressure Monitor DEVI Use as directed to check home blood pressure 2-3 times a week  . carvedilol (COREG) 3.125 MG tablet TAKE 1 TABLET (3.125 MG TOTAL) BY MOUTH 2 (TWO) TIMES DAILY WITH A MEAL.  . cholecalciferol (VITAMIN D3) 25 MCG (1000 UNIT) tablet Take 1,000 Units by mouth daily.  . hydrochlorothiazide (HYDRODIURIL) 25 MG tablet TAKE 1 TABLET (25 MG TOTAL) BY MOUTH DAILY.  Marland Kitchen lamoTRIgine (LAMICTAL) 100 MG tablet Take 100 mg by mouth daily.  . nicotine (NICODERM CQ - DOSED IN MG/24 HOURS) 21 mg/24hr patch Place 1 patch (21 mg total) onto the skin daily.  . pantoprazole (PROTONIX) 40 MG tablet Take 40 mg by mouth every morning.  Marland Kitchen QUEtiapine (SEROQUEL) 50 MG tablet Take 25 mg by mouth at bedtime as needed (sleep).   . valACYclovir (VALTREX) 500 MG tablet Take 1 tablet (500 mg total) by mouth 2 (two) times daily. (Patient taking differently: Take 500 mg by mouth 2 (two) times daily. As needed)   . varenicline (CHANTIX CONTINUING MONTH PAK) 1 MG tablet Take 1 tablet (1 mg total) by mouth 2 (two) times daily.  . vitamin C (ASCORBIC ACID) 500 MG tablet Take 500 mg by mouth daily.   No facility-administered encounter medications on file as of 10/22/2019.    Allergies as of 10/22/2019 - Review Complete 10/22/2019  Allergen Reaction Noted  . Klonopin [clonazepam] Anaphylaxis 10/05/2019  . Lisinopril Anaphylaxis 11/28/2015  . Metformin and related Diarrhea 12/15/2018    Past Medical History:  Diagnosis Date  . Anemia 2001   after gastric bypass  . Anxiety   . Arthritis   . Breast cancer ([place]) 2013   Breast cancer    Radiation therapy  . COPD (chronic obstructive pulmonary disease) ([place])   . Depression   . Fibromyalgia   . GERD (gastroesophageal reflux disease)   . Hypertension   . Personal history of radiation therapy 2013  . Pneumonia   . Pre-diabetes   . PTSD (post-traumatic stress disorder)   . Sleep apnea    uses CPAP    Past Surgical History:  Procedure Laterality Date  . ABDOMINAL HYSTERECTOMY  2018  . BREAST LUMPECTOMY Right 2013  . GASTRIC BYPASS  2001  . TOTAL KNEE ARTHROPLASTY Left 12/22/2018   Procedure: LEFT TOTAL KNEE ARTHROPLASTY;  Surgeon: Leandrew Koyanagi, MD;  Location: [facility];  Service: Orthopedics;  Laterality: Left;    History reviewed. No pertinent family history.  Social History   Socioeconomic History  . Marital status: Widowed  Spouse name: Not on file  . Number of children: Not on file  . Years of education: Not on file  . Highest education level: Not on file  Occupational History  . Not on file  Tobacco Use  . Smoking status: Current Every Day Smoker    Packs/day: 0.75    Years: 20.00    Pack years: 15.00    Types: Cigarettes    Start date: 06/26/1999  . Smokeless tobacco: Never Used  . Tobacco comment: trying to quit  Substance and Sexual Activity  . Alcohol use: Yes    Comment: seldom  . Drug use: No    Types: Cocaine     Comment: clean x 60 days on 11/28/15  . Sexual activity: Yes  Other Topics Concern  . 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 Difficulty of Paying Living Expenses:   Food Insecurity:   . Worried About Charity fundraiser in the Last Year:   . Arboriculturist in the Last Year:   Transportation Needs:   . Film/video editor (Medical):   Marland Kitchen Lack of Transportation (Non-Medical):   Physical Activity: Inactive  . Days of Exercise per Week: 0 days  . Minutes of Exercise per Session: 0 min  Stress: No Stress Concern Present  . Feeling of Stress : Not at all  Social Connections: Unknown  . Frequency of Communication with Friends and Family: Not on file  . Frequency of Social Gatherings with Friends and Family: Not on file  . Attends Religious Services: Not on file  . Active Member of Clubs or Organizations: Not on file  . Attends Archivist Meetings: Not on file  . Marital Status: Widowed  Intimate Partner Violence:   . Fear of Current or Ex-Partner:   . Emotionally Abused:   Marland Kitchen Physically Abused:   . Sexually Abused:     Review of Systems  Constitutional: Negative.   HENT: Negative.   Eyes: Negative.   Respiratory: Positive for apnea, cough and shortness of breath. Negative for wheezing.   Cardiovascular: Negative.   Gastrointestinal: Negative.   Psychiatric/Behavioral: Positive for sleep disturbance.    Vitals:   10/22/19 1555  BP: 116/66  Pulse: 60  Temp: 97.7 F (36.5 C)  SpO2: 100%   Physical Exam  Constitutional: She appears well-developed and well-nourished.  HENT:  Head: Normocephalic and atraumatic.  Eyes: Pupils are equal, round, and reactive to light. Conjunctivae and EOM are normal. Right eye exhibits no discharge. Left eye exhibits no discharge.  Neck: No tracheal deviation present. No thyromegaly  present.  Cardiovascular: Normal rate and regular rhythm.  Pulmonary/Chest: Effort normal and breath sounds normal. No respiratory distress. She has no wheezes.  Musculoskeletal:     Cervical back: Normal range of motion and neck supple.   Data Reviewed:  Recent polysomnogram revealed mild obstructive sleep apnea Patient was started on auto CPAP  Assessment:   COPD with stable symptoms  Moderate obstructive sleep apnea On CPAP  Active smoker -She is planning on quitting, expecting a prescription of nicotine  Shortness of breath likely related to COPD  Plan/Recommendations:  Continue CPAP use  Smoking cessation counseling  I will see her back in the office in about 3 months  Encouraged to call with any significant concerns  Sherrilyn Rist MD [facility] 10/22/2019, 4:27 PM  CC: Rocco Serene, MD

## 2019-10-22 NOTE — Patient Instructions (Signed)
Obstructive sleep apnea -Continue using CPAP on a regular basis  Smoking cessation efforts  I will see you in about 3 months  Call with any significant concerns

## 2019-11-02 ENCOUNTER — Ambulatory Visit
Admission: RE | Admit: 2019-11-02 | Discharge: 2019-11-02 | Disposition: A | Discharge status: Home / Self Care | Payer: Medicare HMO | Source: Ambulatory Visit | Attending: Internal Medicine | Admitting: Internal Medicine

## 2019-11-02 ENCOUNTER — Other Ambulatory Visit: Payer: Self-pay

## 2019-11-02 DIAGNOSIS — E2839 Other primary ovarian failure: Secondary | ICD-10-CM

## 2019-11-03 ENCOUNTER — Other Ambulatory Visit: Payer: Medicare HMO

## 2019-11-25 ENCOUNTER — Other Ambulatory Visit: Payer: Medicare HMO

## 2020-01-05 ENCOUNTER — Other Ambulatory Visit: Payer: Self-pay | Admitting: Podiatry

## 2020-01-20 ENCOUNTER — Encounter: Payer: Self-pay | Admitting: Podiatry

## 2020-01-20 ENCOUNTER — Ambulatory Visit (INDEPENDENT_AMBULATORY_CARE_PROVIDER_SITE_OTHER): Payer: Medicare HMO | Admitting: Podiatry

## 2020-01-20 ENCOUNTER — Other Ambulatory Visit: Payer: Self-pay

## 2020-01-20 DIAGNOSIS — B351 Tinea unguium: Secondary | ICD-10-CM

## 2020-01-20 DIAGNOSIS — M79674 Pain in right toe(s): Secondary | ICD-10-CM | POA: Diagnosis not present

## 2020-01-20 DIAGNOSIS — M79675 Pain in left toe(s): Secondary | ICD-10-CM

## 2020-01-20 NOTE — Progress Notes (Signed)
This patient returns to the office for evaluation and treatment of long thick painful nails .  This patient is unable to trim her own nails since the patient cannot reach her feet.  Patient says the nails are painful walking and wearing his shoes.  He returns for preventive foot care services.  General Appearance  Alert, conversant and in no acute stress.  Vascular  Dorsalis pedis and posterior tibial  pulses are weakly  palpable  bilaterally.  Capillary return is within normal limits  bilaterally. Temperature is within normal limits  bilaterally.  Neurologic  Senn-Weinstein monofilament wire test within normal limits  bilaterally. Muscle power within normal limits bilaterally.  Nails Thick disfigured discolored nails with subungual debris  from hallux to fifth toes bilaterally. No evidence of bacterial infection or drainage bilaterally.  Orthopedic  No limitations of motion  feet .  No crepitus or effusions noted.  No bony pathology or digital deformities noted.  Skin  normotropic skin with no porokeratosis noted bilaterally.  No signs of infections or ulcers noted.     Onychomycosis  Pain in toes right foot  Pain in toes left foot  Debridement  of nails  1-5  B/L with a nail nipper.  Nails were then filed using a dremel tool with no incidents.    RTC   3 months    Ahaan Zobrist DPM  

## 2020-04-14 IMAGING — CR CHEST - 2 VIEW
2 series · 2 of 2 positions shown · non-contrast
Comparison: December 01, 2018

CLINICAL DATA: Preop.  Pain.  COPD.

EXAM:
CHEST - 2 VIEW

[w chest pa]
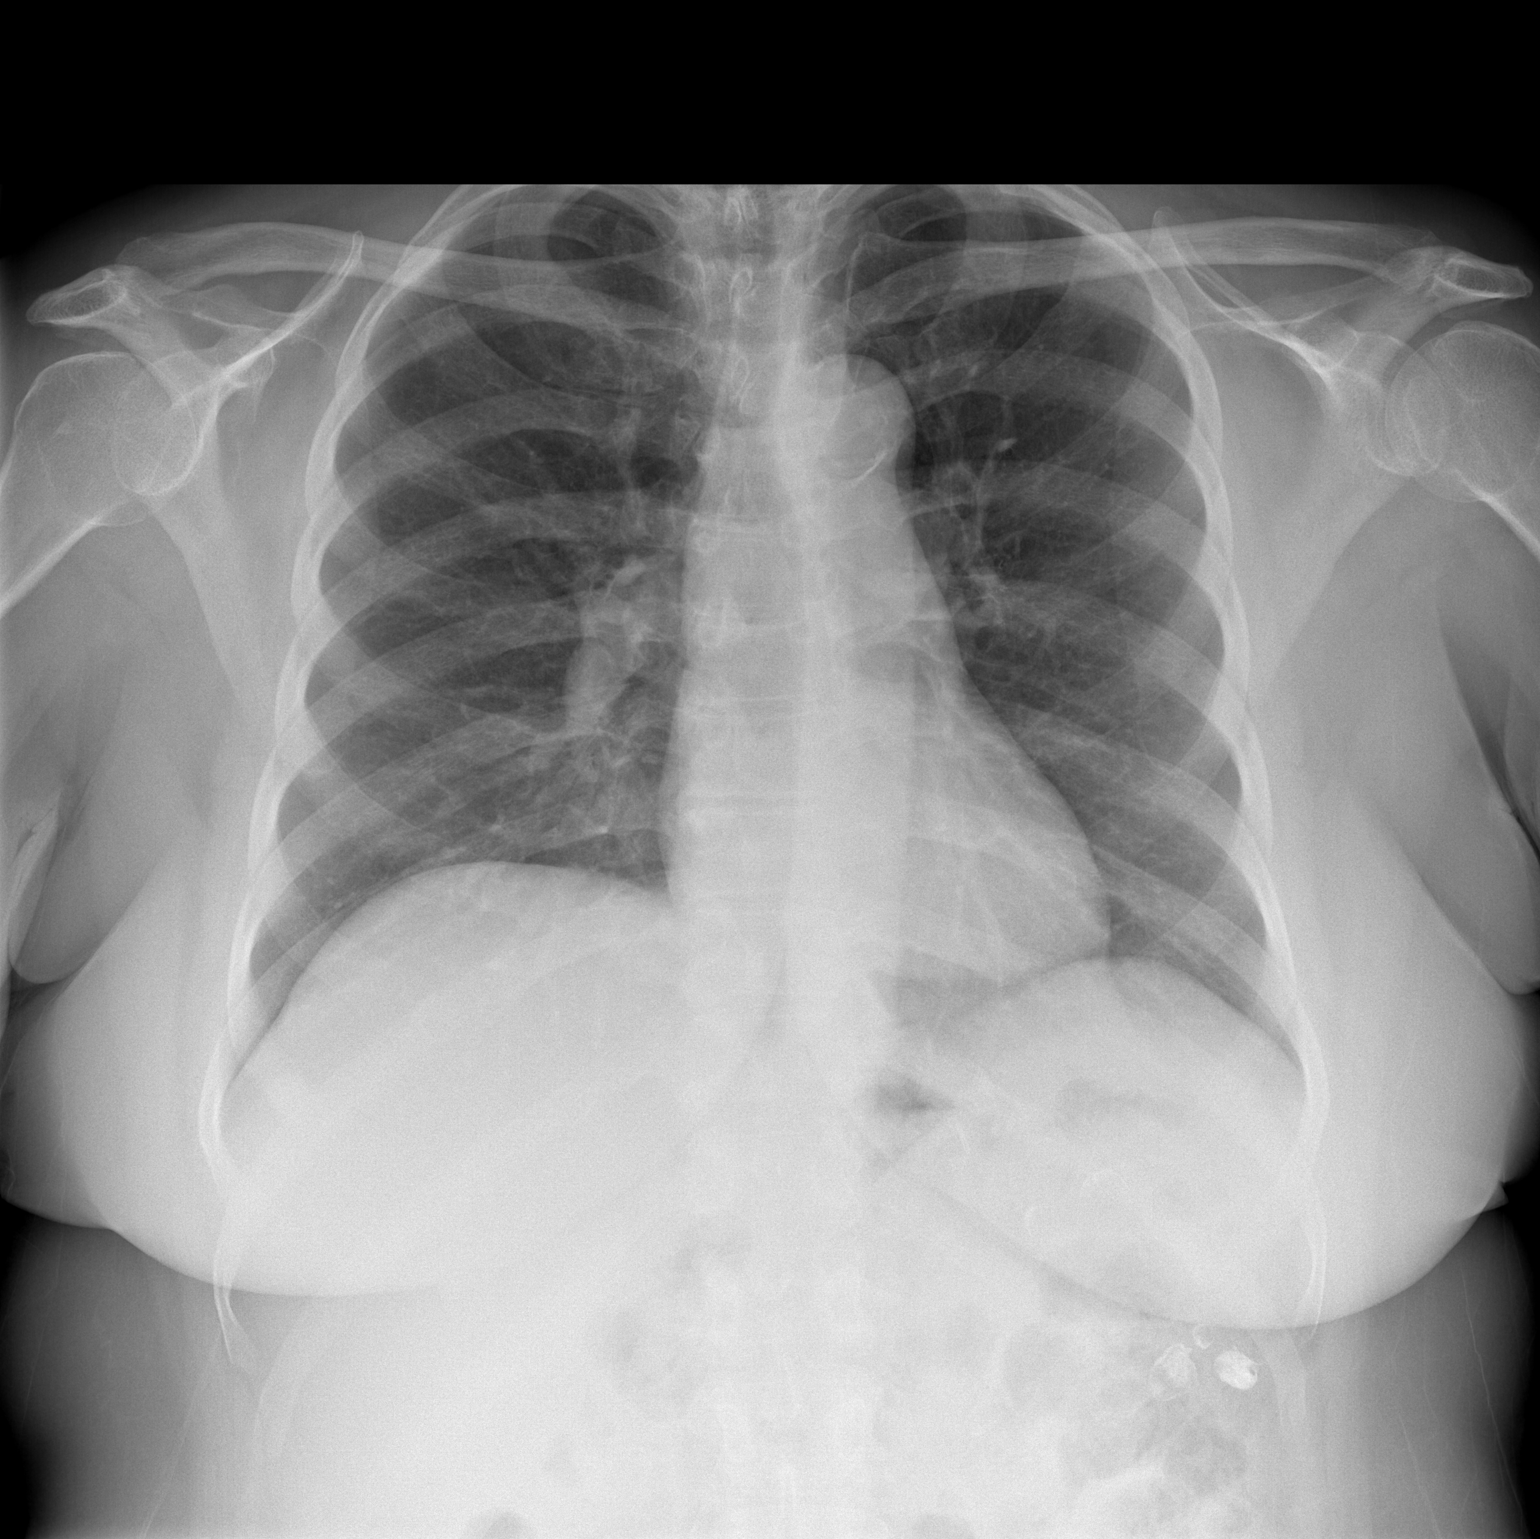

[w chest lat]
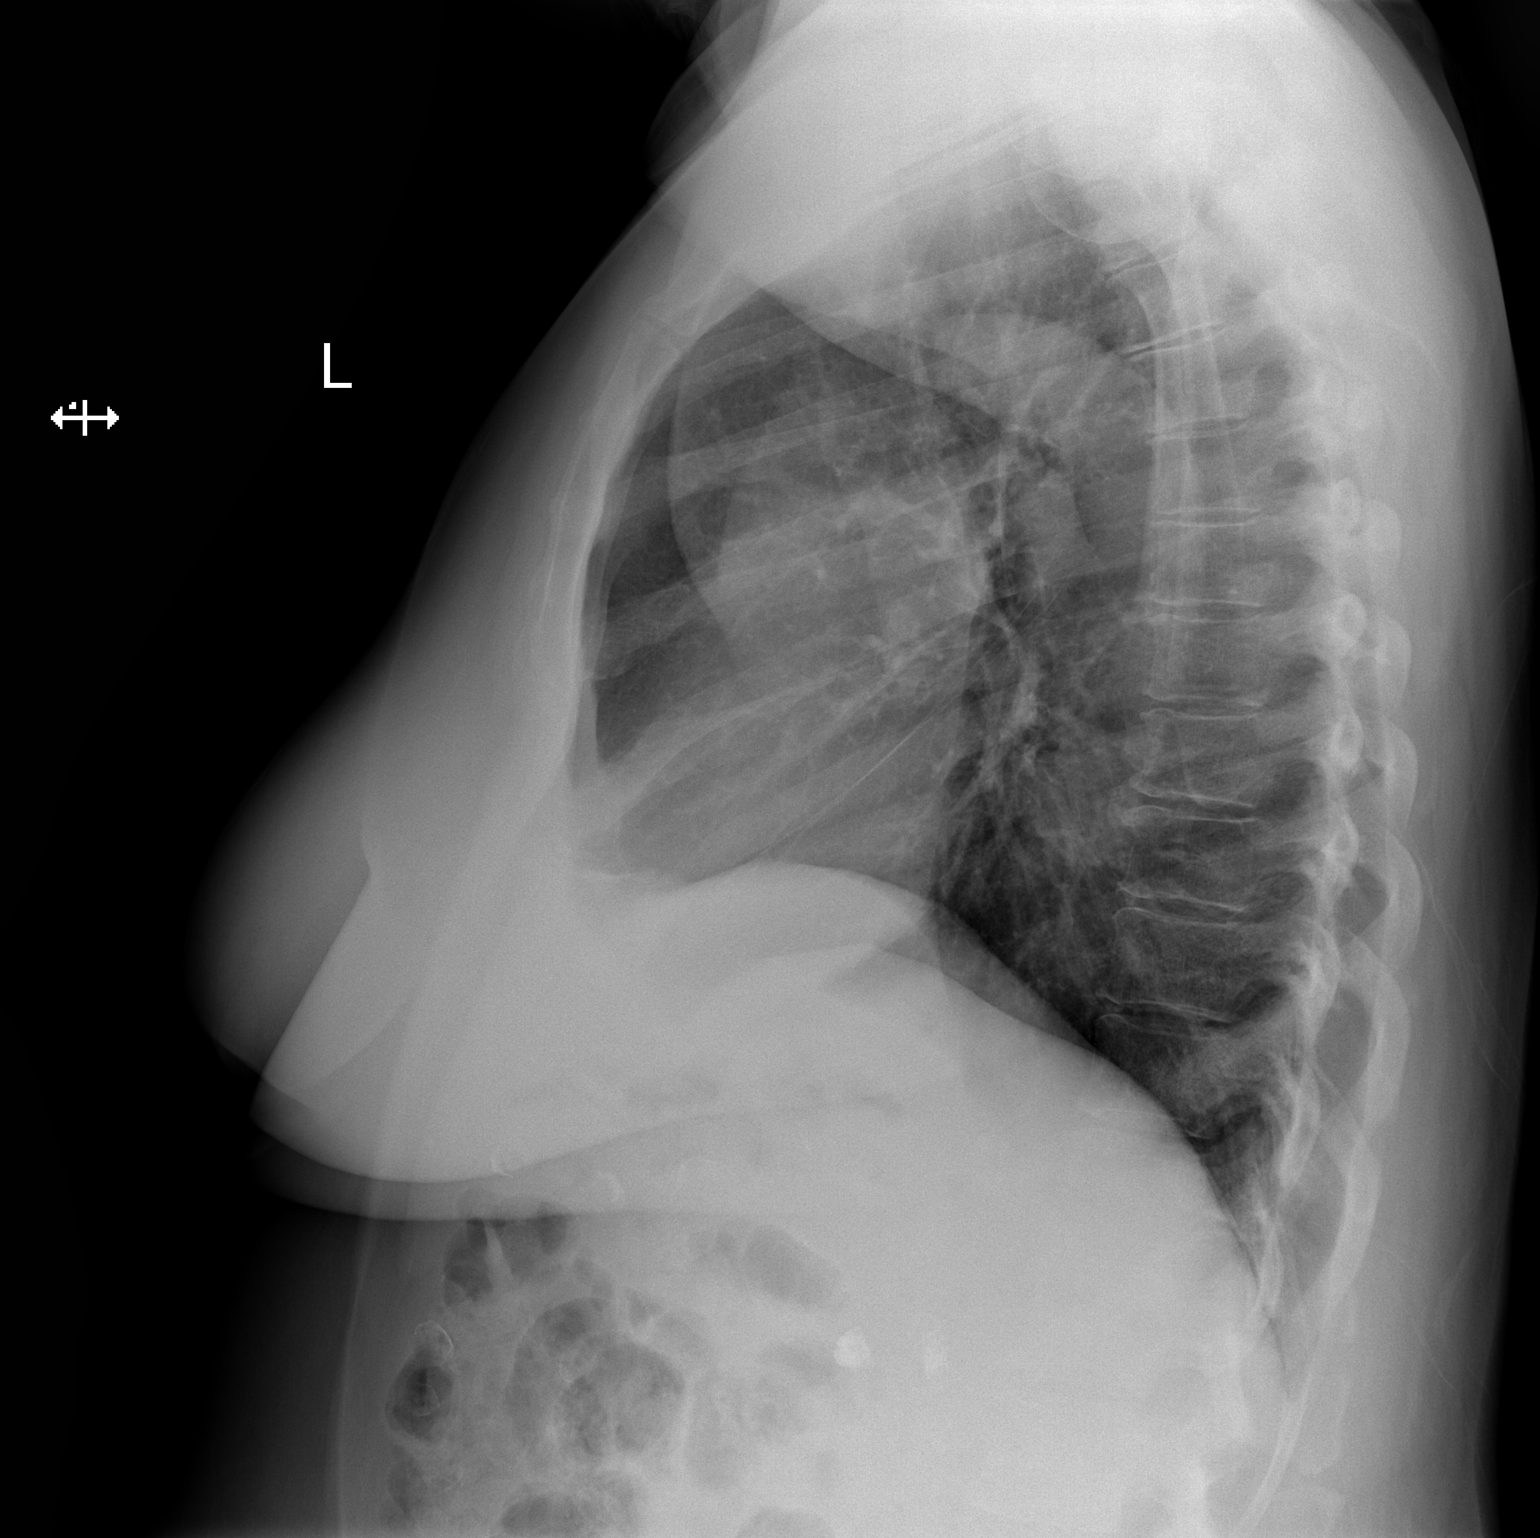

[2 of 2 positions shown; findings below may reference images not displayed]

FINDINGS: The previously demonstrated right lower lobe pneumonia has
significantly improved. There is no pneumothorax. No large pleural
effusion. The heart size is stable. Aortic calcifications are noted.
There is no acute osseous abnormality. There are few hyperdense
bodies in the left upper quadrant which likely represent dense
enteric material within the colon.
IMPRESSION: No active cardiopulmonary disease.

## 2020-04-20 ENCOUNTER — Encounter: Payer: Self-pay | Admitting: Podiatry

## 2020-04-20 ENCOUNTER — Other Ambulatory Visit: Payer: Self-pay

## 2020-04-20 ENCOUNTER — Ambulatory Visit (INDEPENDENT_AMBULATORY_CARE_PROVIDER_SITE_OTHER): Payer: Medicare HMO | Admitting: Podiatry

## 2020-04-20 DIAGNOSIS — M79675 Pain in left toe(s): Secondary | ICD-10-CM

## 2020-04-20 DIAGNOSIS — M79674 Pain in right toe(s): Secondary | ICD-10-CM | POA: Diagnosis not present

## 2020-04-20 DIAGNOSIS — B351 Tinea unguium: Secondary | ICD-10-CM

## 2020-04-20 IMAGING — DX PORTABLE LEFT KNEE - 1-2 VIEW
2 series · 2 of 2 positions shown · non-contrast
Comparison: None.

CLINICAL DATA: Left total knee arthroplasty.

EXAM:
PORTABLE LEFT KNEE - 1-2 VIEW

[knee ap]
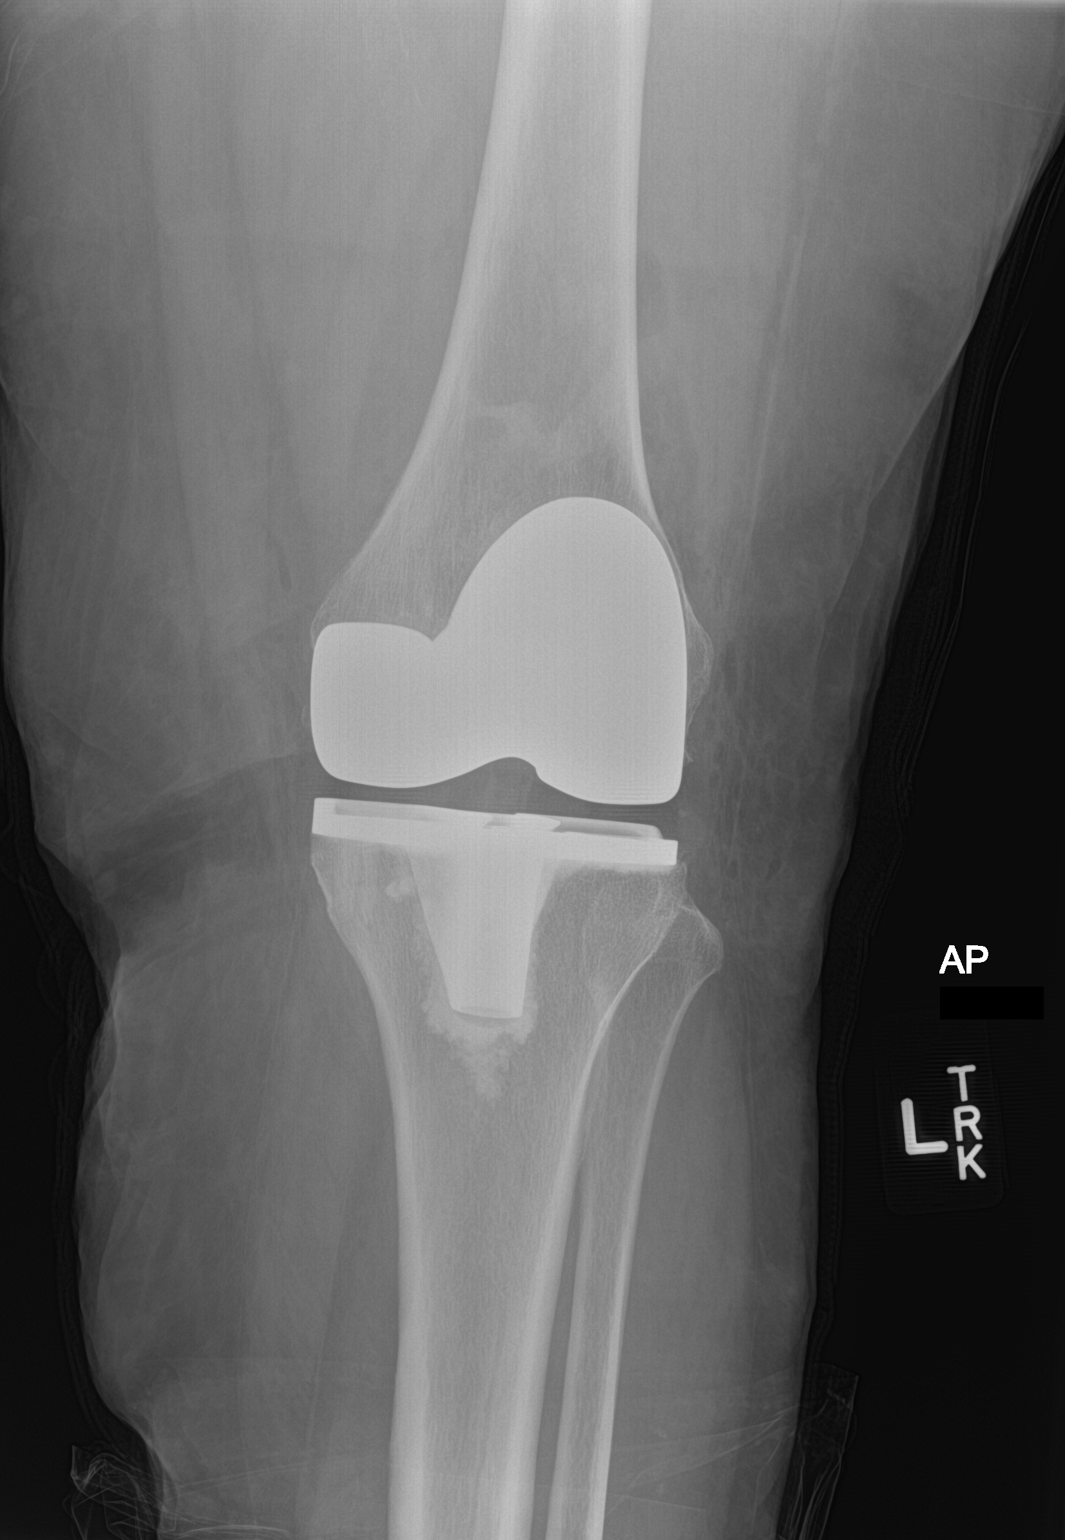

[knee lat]
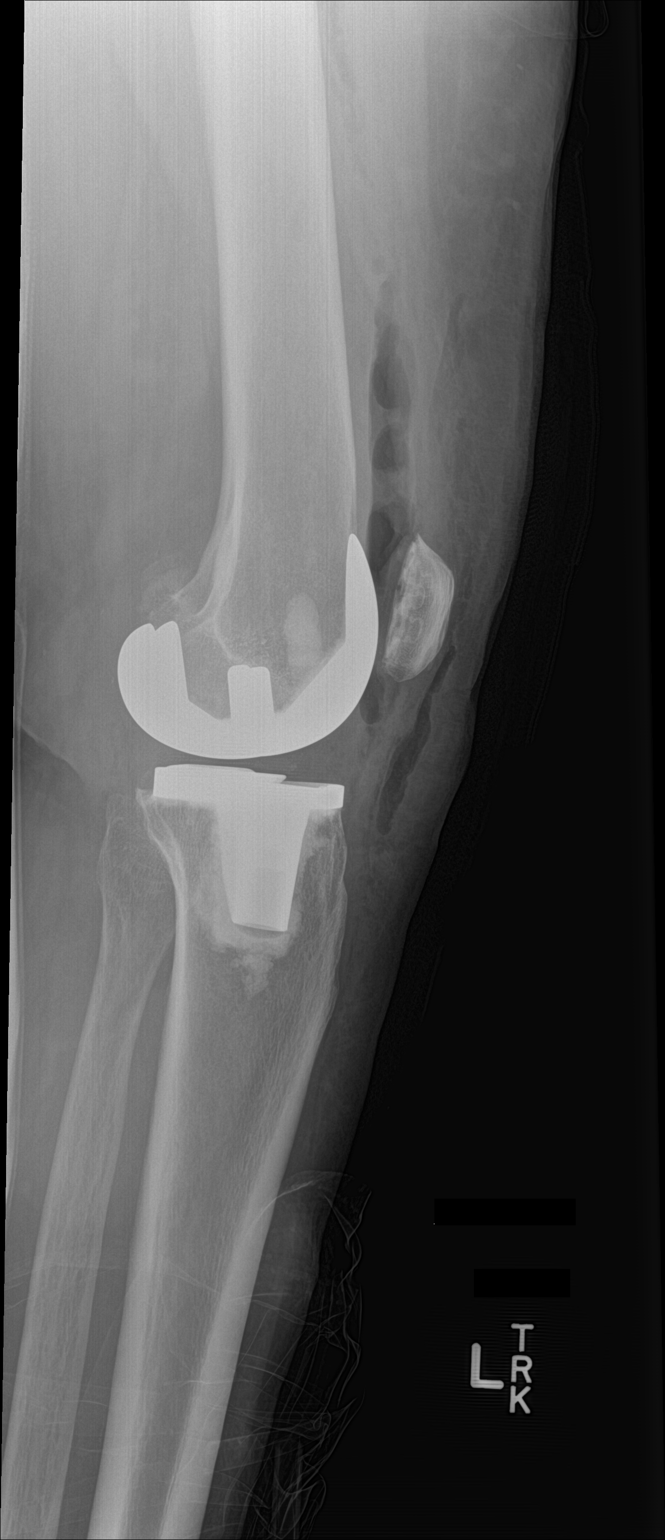

[2 of 2 positions shown; findings below may reference images not displayed]

FINDINGS: The left knee demonstrates a total knee arthroplasty without
evidence of hardware failure complication. There is no significant
joint effusion. There is no fracture or dislocation. The alignment
is anatomic. Post-surgical changes noted in the surrounding soft
tissues.
IMPRESSION: Interval left total knee arthroplasty.

## 2020-04-20 NOTE — Progress Notes (Signed)
This patient returns to the office for evaluation and treatment of long thick painful nails .  This patient is unable to trim her own nails since the patient cannot reach her feet.  Patient says the nails are painful walking and wearing his shoes.  He returns for preventive foot care services.  General Appearance  Alert, conversant and in no acute stress.  Vascular  Dorsalis pedis and posterior tibial  pulses are weakly  palpable  bilaterally.  Capillary return is within normal limits  bilaterally. Temperature is within normal limits  bilaterally.  Neurologic  Senn-Weinstein monofilament wire test within normal limits  bilaterally. Muscle power within normal limits bilaterally.  Nails Thick disfigured discolored nails with subungual debris  from hallux to fifth toes bilaterally. No evidence of bacterial infection or drainage bilaterally.  Orthopedic  No limitations of motion  feet .  No crepitus or effusions noted.  No bony pathology or digital deformities noted.  Skin  normotropic skin with no porokeratosis noted bilaterally.  No signs of infections or ulcers noted.     Onychomycosis  Pain in toes right foot  Pain in toes left foot  Debridement  of nails  1-5  B/L with a nail nipper.  Nails were then filed using a dremel tool with no incidents.    RTC   3 months    Gardiner Barefoot DPM

## 2020-04-23 IMAGING — US US ABDOMEN LIMITED
1 series · 14 of 25 positions shown · non-contrast
Comparison: None.

CLINICAL DATA: 67-year-old female with right upper quadrant
discomfort for the past month. Initial encounter.

EXAM:
ULTRASOUND ABDOMEN LIMITED RIGHT UPPER QUADRANT

[Series 1: us abdomen limited · 0.17mm/px · 14 of 54 slices shown]
[im 1/54]
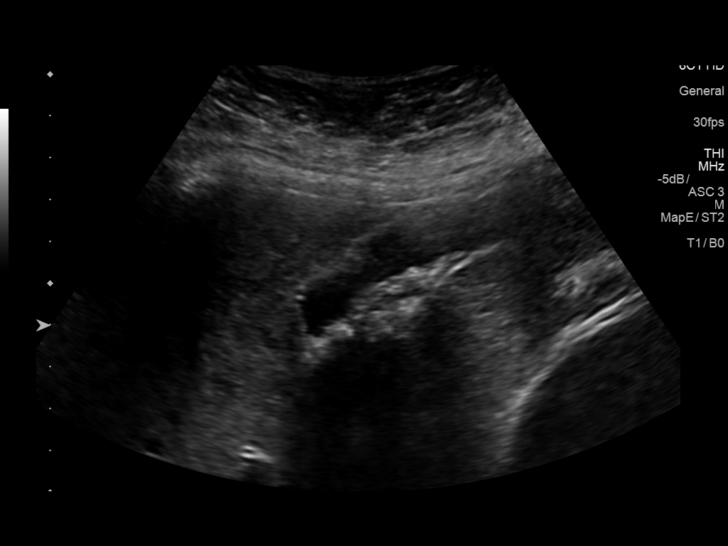
[im 5/54]
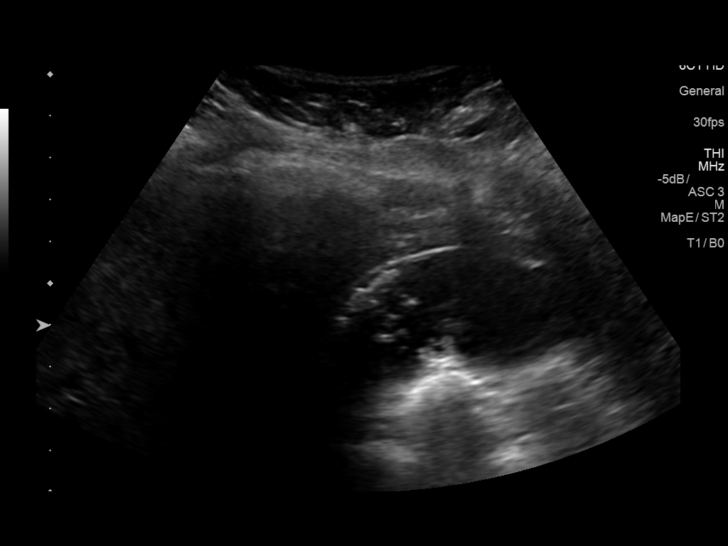
[im 9/54]
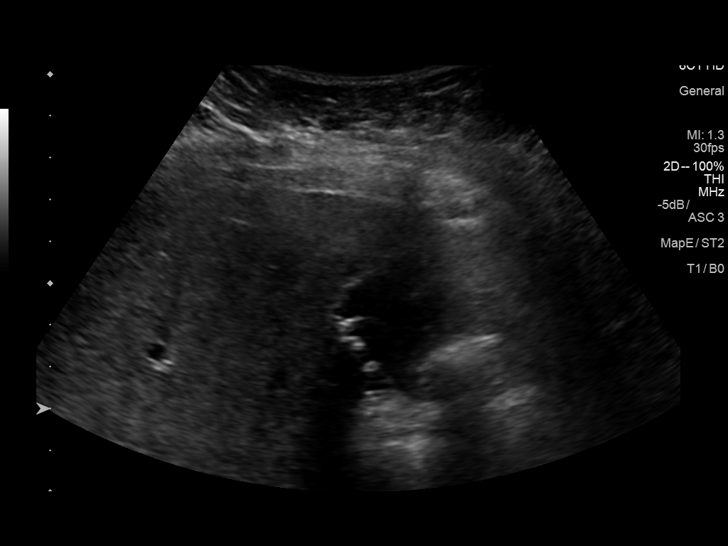
[im 14/54]
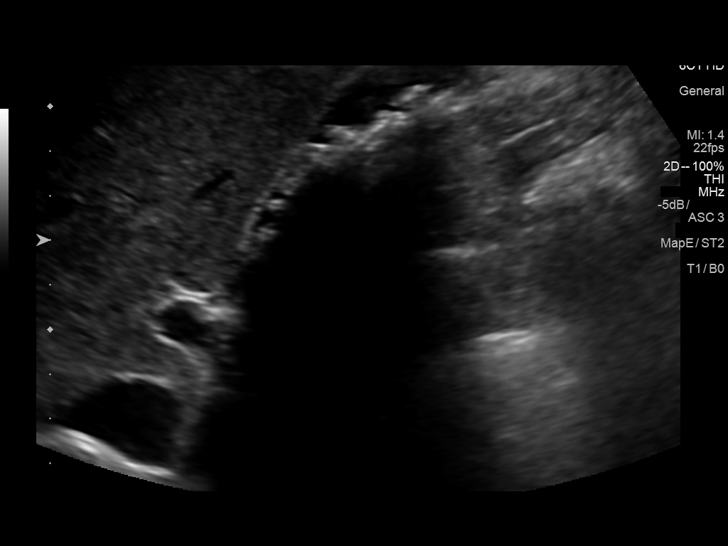
[im 18/54]
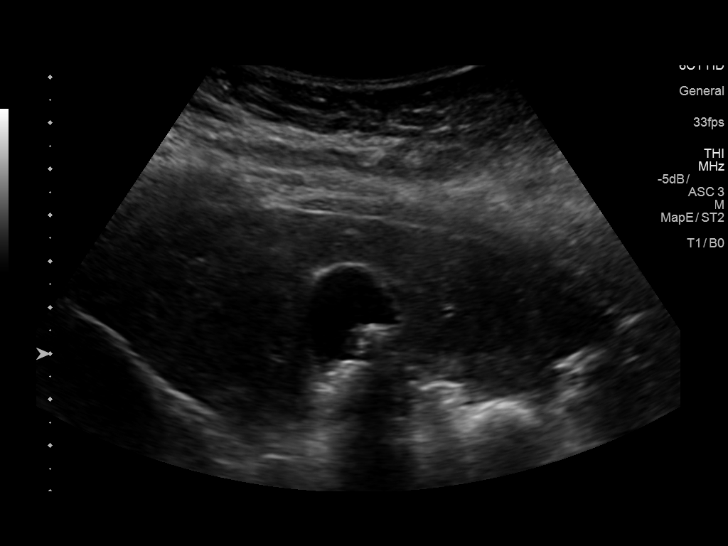
[im 20/54]
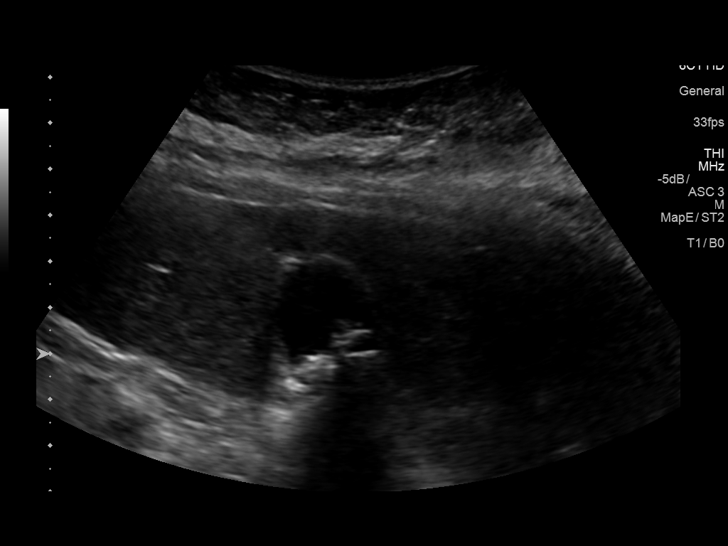
[im 25/54]
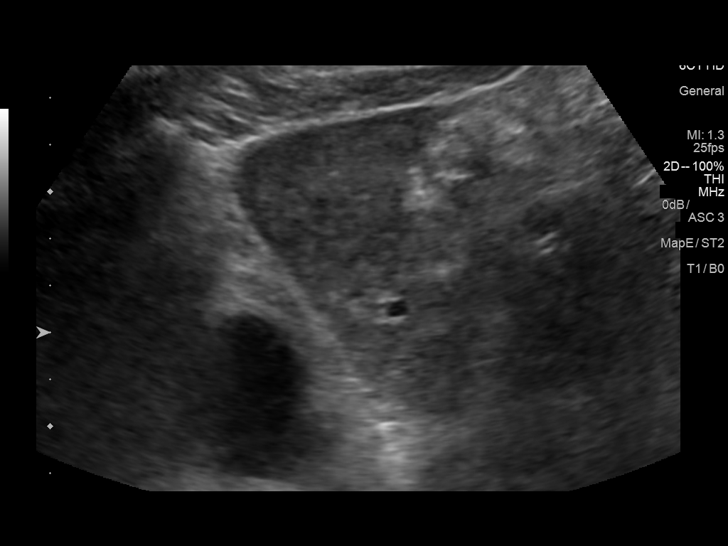
[im 29/54]
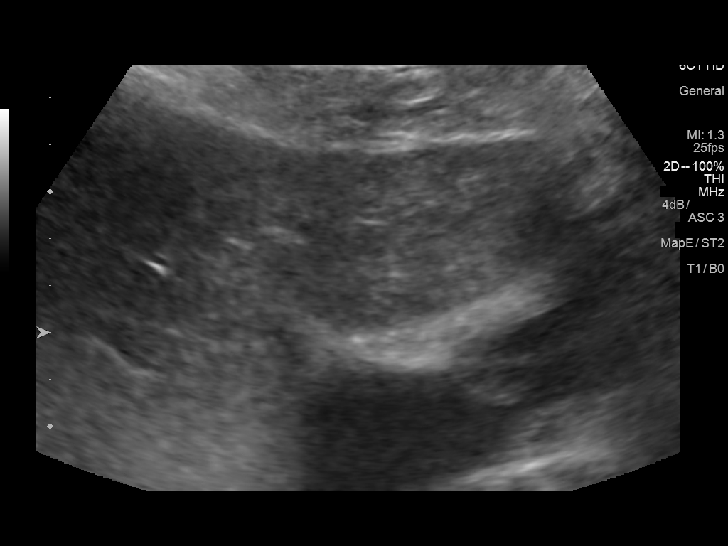
[im 34/54]
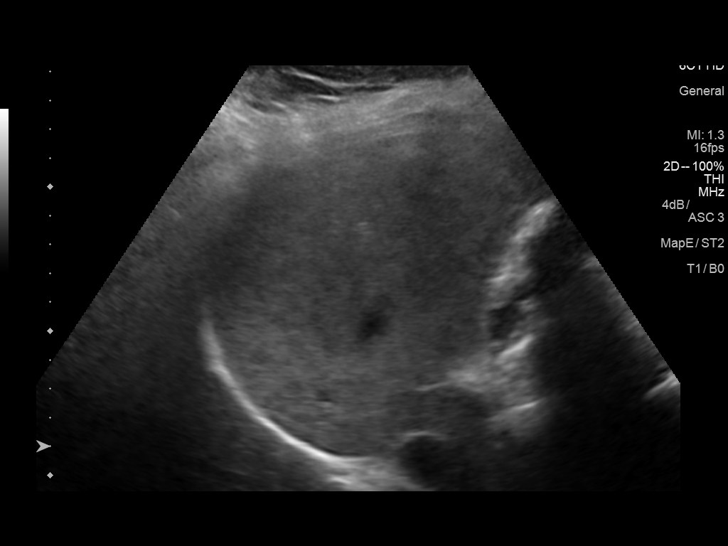
[im 36/54]
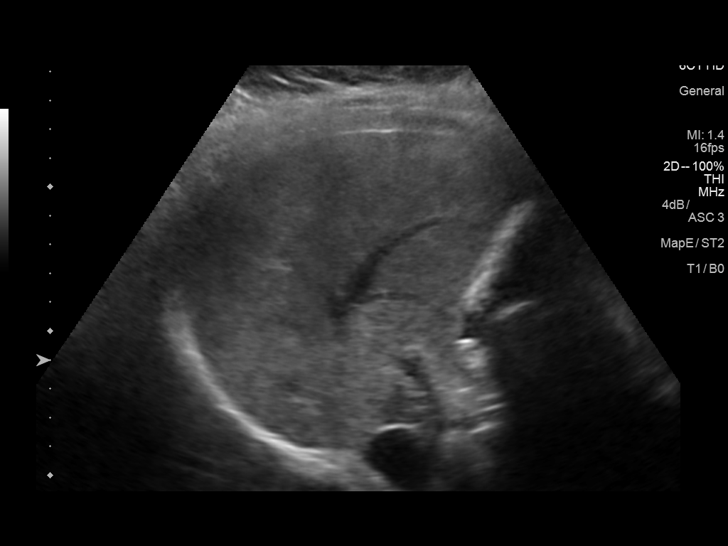
[im 40/54]
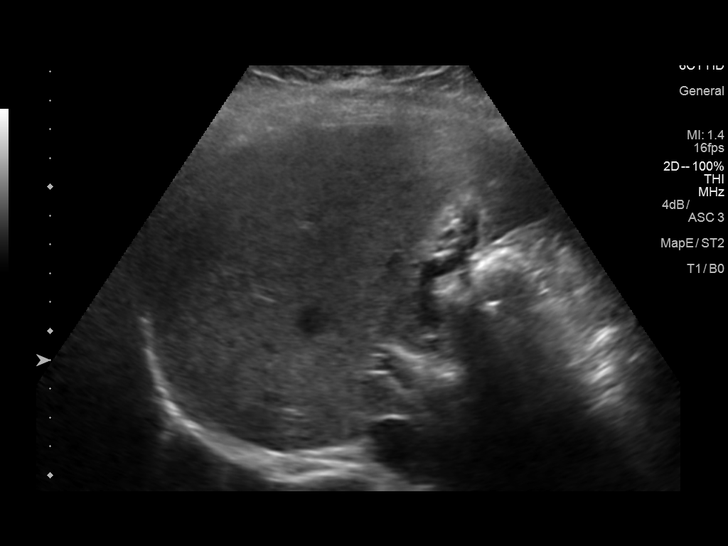
[im 45/54]
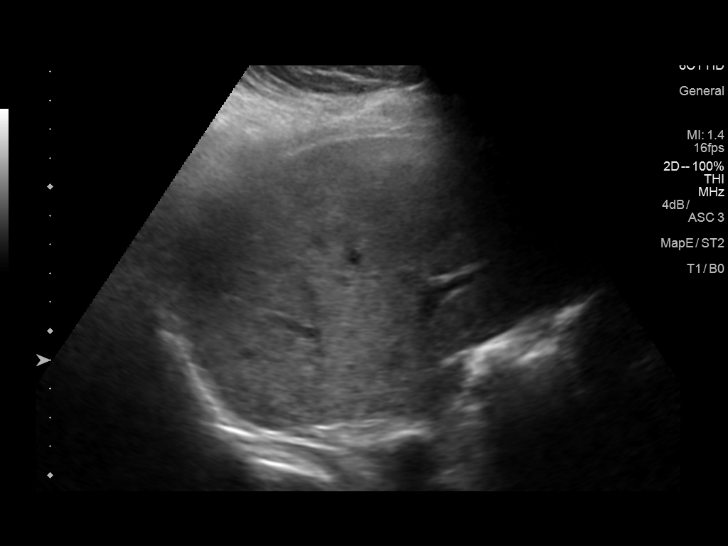
[im 49/54]
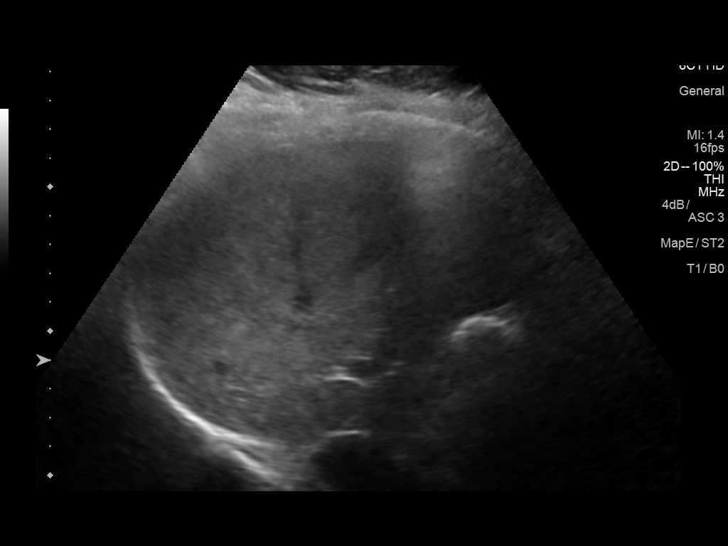
[im 54/54]
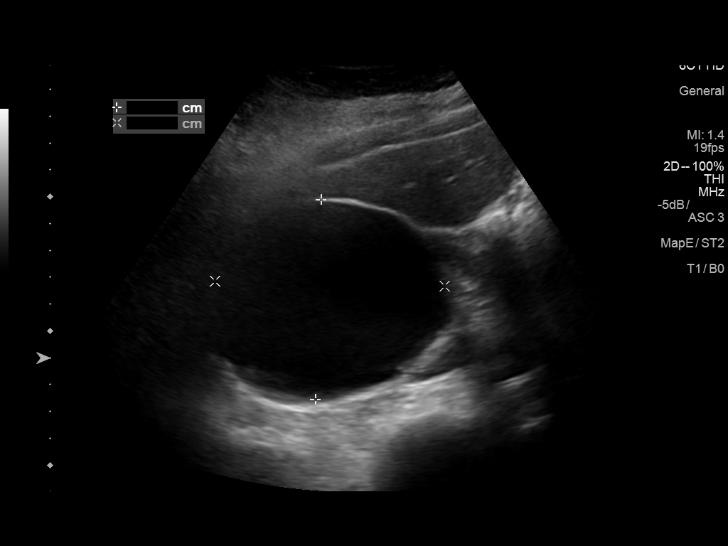

[14 of 25 positions shown; findings below may reference images not displayed]

FINDINGS: Gallbladder:

Multiple mobile gallstones measuring up to 9 mm. No gallbladder wall
thickening or tenderness over the gallbladder during scanning per
sonographer.

Common bile duct:

Diameter: 4.1 mm

Liver:

No focal lesion identified. Within normal limits in parenchymal
echogenicity. Portal vein is patent on color Doppler imaging with
normal direction of blood flow towards the liver.

9.7 x 7.5 x 8.6 cm right renal cyst.
IMPRESSION: 1. Multiple mobile gallstones measuring up to 9 mm. No sonographic
evidence of gallbladder inflammation.
2. 9.7 cm right renal cyst.

## 2020-07-26 ENCOUNTER — Ambulatory Visit: Payer: Medicare HMO | Admitting: Podiatry

## 2020-08-15 ENCOUNTER — Other Ambulatory Visit: Payer: Self-pay | Admitting: Student

## 2020-08-15 ENCOUNTER — Other Ambulatory Visit: Payer: Self-pay | Admitting: Nurse Practitioner

## 2020-08-15 ENCOUNTER — Ambulatory Visit
Admission: RE | Admit: 2020-08-15 | Discharge: 2020-08-15 | Disposition: A | Discharge status: Home / Self Care | Payer: Medicare HMO | Source: Ambulatory Visit | Attending: Student | Admitting: Student

## 2020-08-15 DIAGNOSIS — R059 Cough, unspecified: Secondary | ICD-10-CM

## 2020-08-15 DIAGNOSIS — N6314 Unspecified lump in the right breast, lower inner quadrant: Secondary | ICD-10-CM

## 2020-08-30 ENCOUNTER — Ambulatory Visit (INDEPENDENT_AMBULATORY_CARE_PROVIDER_SITE_OTHER): Payer: Medicare HMO | Admitting: Pulmonary Disease

## 2020-08-30 ENCOUNTER — Other Ambulatory Visit: Payer: Self-pay

## 2020-08-30 ENCOUNTER — Encounter: Payer: Self-pay | Admitting: Pulmonary Disease

## 2020-08-30 VITALS — BP 120/62 | HR 71 | Temp 97.2°F | Ht 66.0 in | Wt 233.8 lb

## 2020-08-30 DIAGNOSIS — G4733 Obstructive sleep apnea (adult) (pediatric): Secondary | ICD-10-CM

## 2020-08-30 DIAGNOSIS — R0602 Shortness of breath: Secondary | ICD-10-CM | POA: Diagnosis not present

## 2020-08-30 NOTE — Progress Notes (Signed)
Rachel Woodard    [id number]    1950-10-10  Primary Care Physician:Stowe, Rachel Moulds, NP  Referring Physician: Rocco Serene, [place],  [place] [address]  Chief complaint:   History of obstructive sleep apnea History of chronic obstructive pulmonary disease  HPI: Working on quitting smoking Down to less than half pack a day Continues to try to stay active walking on a regular basis  Has not been using a CPAP for the last few months -Continues to feel well Feels she is sleeping well and not sleepy during the day   She did use CPAP for few months but did not notice any significant improvement  Had a sleep study in 2018 showing moderate obstructive sleep apnea Was not started on any treatment then Repeat sleep study does confirm obstructive sleep-mild obstructive sleep apnea February 2020  Active smoker-she is expecting a nicotine patches and is willing and planning on quitting  She does have shortness of breath, wheezing, no chest pain or discomfort  No pertinent occupational history  Outpatient Encounter Medications as of 08/30/2020  Medication Sig  . Blood Pressure Monitor DEVI Use as directed to check home blood pressure 2-3 times a week  . carvedilol (COREG) 3.125 MG tablet TAKE 1 TABLET (3.125 MG TOTAL) BY MOUTH 2 (TWO) TIMES DAILY WITH A MEAL.  . cholecalciferol (VITAMIN D3) 25 MCG (1000 UNIT) tablet Take 1,000 Units by mouth daily.  . hydrochlorothiazide (HYDRODIURIL) 25 MG tablet TAKE 1 TABLET (25 MG TOTAL) BY MOUTH DAILY.  Marland Kitchen lamoTRIgine (LAMICTAL) 200 MG tablet   . meloxicam (MOBIC) 15 MG tablet TAKE ONE TABLET BY MOUTH DAILY  . nicotine (NICODERM CQ - DOSED IN MG/24 HOURS) 21 mg/24hr patch Place 1 patch (21 mg total) onto the skin daily.  . pantoprazole (PROTONIX) 40 MG tablet Take 40 mg by mouth every morning.  Marland Kitchen QUEtiapine (SEROQUEL) 200 MG tablet   . valACYclovir (VALTREX) 500 MG tablet Take 1 tablet (500 mg total) by mouth 2 (two)  times daily. (Patient taking differently: Take 500 mg by mouth 2 (two) times daily. As needed)  . varenicline (CHANTIX CONTINUING MONTH PAK) 1 MG tablet Take 1 tablet (1 mg total) by mouth 2 (two) times daily.  . varenicline (CHANTIX PAK) 0.5 MG X 11 & 1 MG X 42 tablet Take one 0.5 mg tablet by mouth once daily for 3 days, then increase to one 0.5 mg tablet twice daily for 4 days, then increase to one 1 mg tablet twice daily.  . vitamin C (ASCORBIC ACID) 500 MG tablet Take 500 mg by mouth daily.   No facility-administered encounter medications on file as of 08/30/2020.    Allergies as of 08/30/2020 - Review Complete 08/30/2020  Allergen Reaction Noted  . Klonopin [clonazepam] Anaphylaxis 10/05/2019  . Lisinopril Anaphylaxis 11/28/2015  . Metformin and related Diarrhea 12/15/2018    Past Medical History:  Diagnosis Date  . Anemia 2001   after gastric bypass  . Anxiety   . Arthritis   . Breast cancer ([facility]) 2013   Breast cancer    Radiation therapy  . COPD (chronic obstructive pulmonary disease) ([facility])   . Depression   . Fibromyalgia   . GERD (gastroesophageal reflux disease)   . Hypertension   . Personal history of radiation therapy 2013  . Pneumonia   . Pre-diabetes   . PTSD (post-traumatic stress disorder)   . Sleep apnea    uses CPAP  Past Surgical History:  Procedure Laterality Date  . ABDOMINAL HYSTERECTOMY  2018  . BREAST LUMPECTOMY Right 2013  . GASTRIC BYPASS  2001  . TOTAL KNEE ARTHROPLASTY Left 12/22/2018   Procedure: LEFT TOTAL KNEE ARTHROPLASTY;  Surgeon: Leandrew Koyanagi, MD;  Location: [facility];  Service: Orthopedics;  Laterality: Left;    History reviewed. No pertinent family history.  Social History   Socioeconomic History  . Marital status: Widowed    Spouse name: Not on file  . Number of children: Not on file  . Years of education: Not on file  . Highest education level: Not on file  Occupational History  . Not on file  Tobacco Use  . Smoking status:  Current Every Day Smoker    Packs/day: 0.75    Years: 20.00    Pack years: 15.00    Types: Cigarettes    Start date: 06/26/1999  . Smokeless tobacco: Never Used  . Tobacco comment: trying to quit  Vaping Use  . Vaping Use: Never used  Substance and Sexual Activity  . Alcohol use: Yes    Comment: seldom  . Drug use: No    Types: Cocaine    Comment: clean x 60 days on 11/28/15  . Sexual activity: Yes  Other Topics Concern  . 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   HENT: Negative.   Eyes: Negative.   Respiratory: Positive for apnea, cough and shortness of breath. Negative for wheezing.   Cardiovascular: Negative.   Gastrointestinal: Negative.   Psychiatric/Behavioral: Positive for sleep disturbance.    Vitals:   08/30/20 1557  BP: 120/62  Pulse: 71  SpO2: 99%   Physical Exam Constitutional:      Appearance: She is well-developed.  HENT:     Head: Normocephalic and atraumatic.  Eyes:     General:        Right eye: No discharge.        Left eye: No discharge.     Conjunctiva/sclera: Conjunctivae normal.     Pupils: Pupils are equal, round, and reactive to light.  Neck:     Thyroid: No thyromegaly.     Trachea: No tracheal deviation.  Cardiovascular:     Rate and Rhythm: Normal rate and regular rhythm.  Pulmonary:     Effort: Pulmonary effort is normal. No respiratory distress.     Breath sounds: Normal breath sounds. No wheezing.  Musculoskeletal:     Cervical back: Normal range of motion and neck supple.    Data Reviewed:  Recent polysomnogram revealed mild  obstructive sleep apnea Patient was started on auto CPAP  Assessment:   COPD with stable symptoms  She does have a cough with sputum production -Recent chest x-ray reviewed showing no infiltrative process  Obstructive sleep apnea -Not tolerating CPAP well and has not been using it -We will stay off CPAP at present -Encouraged to continue to work on weight loss efforts and regular exercise  Active smoker -She continues to cut down and is down to less than half a pack  a day and still working on quitting  Shortness of breath likely related to COPD  Plan/Recommendations:  Smoking cessation counseling  I will see her back in the office in about 6 months  Encouraged to call with any significant concerns   Sherrilyn Rist MD [facility] 08/30/2020, 3:59 PM  CC: Rocco Serene, MD

## 2020-08-30 NOTE — Patient Instructions (Signed)
No changes to care at present  You may stay off the CPAP as long as you are not having symptoms of daytime sleepiness Apnea should get better if you continue to exercise regularly and lose weight  Continue to work on quitting  Call with significant concerns  I will see you in 6 months

## 2020-09-27 ENCOUNTER — Ambulatory Visit
Admission: RE | Admit: 2020-09-27 | Discharge: 2020-09-27 | Disposition: A | Discharge status: Home / Self Care | Payer: Medicare HMO | Source: Ambulatory Visit | Attending: Nurse Practitioner | Admitting: Nurse Practitioner

## 2020-09-27 ENCOUNTER — Other Ambulatory Visit: Payer: Self-pay

## 2020-09-27 ENCOUNTER — Other Ambulatory Visit: Payer: Self-pay | Admitting: Nurse Practitioner

## 2020-09-27 DIAGNOSIS — N6314 Unspecified lump in the right breast, lower inner quadrant: Secondary | ICD-10-CM

## 2020-11-15 ENCOUNTER — Encounter: Payer: Self-pay | Admitting: Neurology

## 2020-11-15 ENCOUNTER — Other Ambulatory Visit: Payer: Self-pay

## 2020-11-15 ENCOUNTER — Ambulatory Visit (INDEPENDENT_AMBULATORY_CARE_PROVIDER_SITE_OTHER): Payer: Medicare HMO | Admitting: Neurology

## 2020-11-15 VITALS — BP 121/81 | HR 63 | Ht 66.0 in | Wt 246.0 lb

## 2020-11-15 DIAGNOSIS — R200 Anesthesia of skin: Secondary | ICD-10-CM | POA: Diagnosis not present

## 2020-11-15 DIAGNOSIS — R29818 Other symptoms and signs involving the nervous system: Secondary | ICD-10-CM

## 2020-11-15 DIAGNOSIS — R519 Headache, unspecified: Secondary | ICD-10-CM

## 2020-11-15 DIAGNOSIS — G08 Intracranial and intraspinal phlebitis and thrombophlebitis: Secondary | ICD-10-CM

## 2020-11-15 DIAGNOSIS — U071 COVID-19: Secondary | ICD-10-CM

## 2020-11-15 DIAGNOSIS — R29898 Other symptoms and signs involving the musculoskeletal system: Secondary | ICD-10-CM | POA: Diagnosis not present

## 2020-11-15 DIAGNOSIS — R202 Paresthesia of skin: Secondary | ICD-10-CM

## 2020-11-15 DIAGNOSIS — G8929 Other chronic pain: Secondary | ICD-10-CM

## 2020-11-15 NOTE — Progress Notes (Signed)
[facility]    Provider:  Dr Jaynee Eagles Requesting Provider: Cipriano Mile, NP Primary Care Provider:  Cipriano Mile, NP  CC:  Left-sided headaches  HPI:  Rachel Woodard is a 70 y.o. female here as requested by Cipriano Mile, NP for migraine.  Past medical history sleep apnea obstructive noncompliant with CPAP, PTSD, prediabetes, hypertensive kidney disease stage III yea, fibromyalgia, depression, COPD, cocaine dependence in remission, cigarette nicotine dependence in remission, breast cancer 2013 s/p radiation, bipolar 1, arthritis, anxiety, obesity, depression, tobacco abuse.   Headaches started January 4th in the setting of covid symptoms. The other symptoms went away but still with the headache. On theleft side, aching, not pulsating, not pounding, not throbbing, just there hasn;t gone away, she wakes up with headache every day, 5/10 in pain, daily, can be worse to an 8/10, no eye pain, no vision changes, no jaw pain, some radiation into the right side of the neck, no nausea, no vomiting, no light sensitivity. No history of migraines. She has tingling in the right hand and she feels that is related to the headaches, some tingling in the right hand, not nocturnally. No known triggers. Nothing makes it better or worse. It has been worsening in severity and can be 8/10 in pain. Pain medication is not helping. No other focal neurologic deficits, associated symptoms, inciting events or modifiable factors.  Reviewed notes, labs and imaging from outside physicians, which showed:  I reviewed primary care notes: Patient has migraines, nothing helps it, patient states that pain radiates from her forehead to the back of the head, 7 out of 10 in pain, she can perform her daily function with her headaches, takes Tylenol or Aleve, denies dizziness, denies a history of migraines, chronic fatigue.  Labs were drawn at last appointment I do not have those results including CBC, CMP, stool  culture and sensitivity, B12, PTH with calcium.  I reviewed epic and "care everywhere", I do not see any brain imaging.  Last labs I have available in June 2020 showed BUN 17 and creatinine 1.36 which was elevated from her baseline of approximately 0.9 in the setting of illness.  I reviewed Dr. Ernesto Rutherford notes, patient has been noncompliant with her CPAP for obstructive sleep apnea.  Sleep study in 2018 showed moderate obstructive sleep apnea.  Repeat sleep study does confirm obstructive sleep apnea but mild February 2020.  She is an active smoker.  From a thorough review of records, medications tried that can be used in migraine management include acetaminophen, amlodipine which is a calcium channel blocker, aspirin, carvedilol which is a beta-blocker, Celebrex, Lexapro, gabapentin, Lamictal, meloxicam, methocarbamol, Reglan, ondansetron, Phenergan, Seroquel, venlafaxine.  Review of Systems: Patient complains of symptoms per HPI as well as the following symptoms: headache. Pertinent negatives and positives per HPI. All others negative.   Social History   Socioeconomic History  . Marital status: Widowed    Spouse name: Not on file  . Number of children: 3  . Years of education: 2 yrs college  . Highest education level: Not on file  Occupational History  . Not on file  Tobacco Use  . Smoking status: Current Every Day Smoker    Packs/day: 1.00    Years: 20.00    Pack years: 20.00    Types: Cigarettes    Start date: 06/26/1999  . Smokeless tobacco: Never Used  . Tobacco comment: trying to quit  Vaping Use  . Vaping Use: Never used  Substance and Sexual Activity  . Alcohol  use: Not Currently    Comment: seldom  . Drug use: No    Types: Cocaine    Comment: clean x 60 days on 11/28/15  . Sexual activity: Yes  Other Topics Concern  . 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 Diabetes Other   . Cancer Other   . Prostate cancer Father        stage IV  . Liver disease Brother   . Leukemia Mother   . Migraines Neg Hx   . Headache Neg Hx     Past Medical History:  Diagnosis Date  . Anemia 2001   after gastric bypass  . Anxiety   . Aortic atherosclerosis ([place])   . Arthritis   . Bipolar 1 disorder ([place])   . Breast cancer ([place]) 2013   Breast cancer    Radiation therapy  . Calcified granuloma of lung ([place])   . Cigarette nicotine dependence   . Cigarette nicotine dependence in remission   . Cocaine dependence in remission ([place])   . COPD (chronic obstructive pulmonary disease) ([place])   . Depression   . Disequilibrium   . Fibromyalgia   . GERD (gastroesophageal reflux disease)   . HSV infection   . Hypertension   . Hypertensive kidney disease with stage 3a chronic kidney disease ([place])   . Hypertensive kidney disease with stage 3b chronic kidney disease ([place])   . Personal history of infectious disease   . Personal history of radiation therapy 2013  . Pneumonia   . Pre-diabetes   . PTSD (post-traumatic stress disorder)   . Seasonal allergic rhinitis due to pollen   . Sleep apnea    uses CPAP  . Vitamin D deficiency     Patient Active Problem List   Diagnosis Date Noted  . Pain due to onychomycosis of toenails of both feet 01/20/2020  . Shortness of breath 10/05/2019  . Status post total left knee replacement 12/22/2018  . MDD (major depressive disorder), recurrent episode, mild ([place]) 10/29/2018  . Autoimmune hepatitis ([place]) 10/29/2018  . OSA (obstructive sleep apnea) 10/29/2018  . Renal cyst, right  10/22/2018  . Lung nodule, solitary 10/22/2018  . Genital herpes simplex 10/29/2017  . Vitamin D deficiency 10/29/2017  . Class 2 obesity due to excess calories without serious comorbidity with body mass index (BMI) of 38.0 to 38.9 in adult 10/29/2017  . Essential hypertension 10/29/2017  . Pre-diabetes 02/14/2017  . Fibromyalgia 02/14/2017  . Substance abuse in remission ([place]) 02/14/2017  . Anxiety and depression 02/14/2017  . HX: breast cancer 02/14/2017  . Urge incontinence 11/13/2016  . Unilateral primary osteoarthritis, left knee 06/14/2016  . PMB (postmenopausal bleeding) 04/02/2016  . GERD (gastroesophageal reflux disease) 11/02/2015  . Angioedema 10/24/2013  . Tobacco abuse 10/24/2013  . DCIS (ductal carcinoma in situ) of breast 04/07/2013  . Depression 04/07/2013    Past Surgical History:  Procedure Laterality Date  . ABDOMINAL HYSTERECTOMY  2018  .  BREAST LUMPECTOMY Right 2013  . GASTRIC BYPASS  2001  . TOTAL KNEE ARTHROPLASTY Left 12/22/2018   Procedure: LEFT TOTAL KNEE ARTHROPLASTY;  Surgeon: Leandrew Koyanagi, MD;  Location: [place] [facility];  Service: Orthopedics;  Laterality: Left;    Current Outpatient Medications  Medication Sig Dispense Refill  . Blood Pressure Monitor DEVI Use as directed to check home blood pressure 2-3 times a week 1 Device 0  . carvedilol (COREG) 3.125 MG tablet TAKE 1 TABLET (3.125 MG TOTAL) BY MOUTH 2 (TWO) TIMES DAILY WITH A MEAL. 180 tablet 0  . cholecalciferol (VITAMIN D3) 25 MCG (1000 UNIT) tablet Take 1,000 Units by mouth daily.    Marland Kitchen escitalopram (LEXAPRO) 5 MG tablet Take 5 mg by mouth at bedtime.    . hydrochlorothiazide (HYDRODIURIL) 25 MG tablet TAKE 1 TABLET (25 MG TOTAL) BY MOUTH DAILY. 90 tablet 0  . lamoTRIgine (LAMICTAL) 200 MG tablet Take 200 mg by mouth daily.    . pantoprazole (PROTONIX) 40 MG tablet Take 40 mg by mouth every morning.    Marland Kitchen QUEtiapine (SEROQUEL) 200 MG tablet Take 200 mg by mouth at bedtime.    . sucralfate (CARAFATE) 1  g tablet 1 tablet on an empty stomach    . vitamin C (ASCORBIC ACID) 500 MG tablet Take 500 mg by mouth daily.     No current facility-administered medications for this visit.    Allergies as of 11/15/2020 - Review Complete 11/15/2020  Allergen Reaction Noted  . Klonopin [clonazepam] Anaphylaxis 10/05/2019  . Lisinopril Anaphylaxis 11/28/2015  . Metformin and related Diarrhea 12/15/2018    Vitals: BP 121/81 (BP Location: Left Arm, Patient Position: Sitting, Cuff Size: Large)   Pulse 63   Ht _0  (1.676 m)   Wt 246 lb (111.6 kg)   BMI 39.71 kg/m  Last Weight:  Wt Readings from Last 1 Encounters:  11/15/20 246 lb (111.6 kg)   Last Height:   Ht Readings from Last 1 Encounters:  11/15/20 _1  (1.676 m)     Physical exam: Exam: Gen: NAD, conversant, well nourised, obese, well groomed                     CV: RRR, no MRG. No Carotid Bruits. No peripheral edema, warm, nontender Eyes: Conjunctivae clear without exudates or hemorrhage  Neuro: Detailed Neurologic Exam  Speech:    Speech is normal; fluent and spontaneous with normal comprehension.  Cognition:    The patient is oriented to person, place, and time;     recent and remote memory intact;     language fluent;     normal attention, concentration,     fund of knowledge Cranial Nerves:    The pupils are equal, round, and reactive to light. The fundi are flat. Visual fields are full to finger confrontation. Extraocular movements are intact. Trigeminal sensation is intact and the muscles of mastication are normal. The face is symmetric. The palate elevates in the midline. Hearing intact. Voice is normal. Shoulder shrug is normal. The tongue has normal motion without fasciculations.   Coordination:    Normal finger to nose and heel to shin. Normal rapid alternating movements.   Gait:    Antalgic with cane (right ankle pain).   Motor Observation:    No asymmetry, no atrophy, and no involuntary movements  noted. Tone:    Normal muscle tone.    Posture:    Posture is normal. normal erect    Strength: 5-/5 right deltoid  weakness otherwise Strength is V/V in the upper and lower limbs.      Sensation: numbness right hand     Reflex Exam:  DTR's:  Absent AJs, hypo patellars, 1+ bicpes   Toes:    The toes are downgoing bilaterally.   Clonus:    Clonus is absent.    Assessment/Plan:  70 year old with continuous intractable left-sided headache since having Covid in January. Focal neurologic symptoms, right prox mild weakness and sensory changes right hand  MRI brain and MRV head for intractable new onset headache with focal neurologic deficits int he setting of covid symptoms. Need to evaluate for venous thrombosis and other causes of new onset headache in this setting (stroke, lesions, space-occupying masses). GCA less likely but will check esr/crp.  If negative consider this may be an atypical migraine and can try Emgality (she hs tried multiple headache/migraine medications in the past)  Orders Placed This Encounter  Procedures  . MR BRAIN W WO CONTRAST  . MR MRV HEAD WO CM  . Sedimentation rate  . C-reactive protein  . Basic Metabolic Panel   No orders of the defined types were placed in this encounter.   Cc: Cipriano Mile, NP,  Cipriano Mile, NP  Sarina Ill, MD  [facility] [address] [place], [place] [address]  Phone [phone] Fax [phone]

## 2020-11-15 NOTE — Patient Instructions (Signed)
Blood work MRI brain and blood vessels/veins If negative recommend Emgality  Galcanezumab injection What is this medicine? GALCANEZUMAB (gal ka NEZ ue mab) is used to prevent migraines and treat cluster headaches. This medicine may be used for other purposes; ask your health care provider or pharmacist if you have questions. COMMON BRAND NAME(S): Emgality What should I tell my health care provider before I take this medicine? They need to know if you have any of these conditions:  an unusual or allergic reaction to galcanezumab, other medicines, foods, dyes, or preservatives  pregnant or trying to get pregnant  breast-feeding How should I use this medicine? This medicine is for injection under the skin. You will be taught how to prepare and give this medicine. Use exactly as directed. Take your medicine at regular intervals. Do not take your medicine more often than directed. It is important that you put your used needles and syringes in a special sharps container. Do not put them in a trash can. If you do not have a sharps container, call your pharmacist or healthcare provider to get one. Talk to your pediatrician regarding the use of this medicine in children. Special care may be needed. Overdosage: If you think you have taken too much of this medicine contact a poison control center or emergency room at once. NOTE: This medicine is only for you. Do not share this medicine with others. What if I miss a dose? If you miss a dose, take it as soon as you can. If it is almost time for your next dose, take only that dose. Do not take double or extra doses. What may interact with this medicine? Interactions are not expected. This list may not describe all possible interactions. Give your health care provider a list of all the medicines, herbs, non-prescription drugs, or dietary supplements you use. Also tell them if you smoke, drink alcohol, or use illegal drugs. Some items may interact with  your medicine. What should I watch for while using this medicine? Tell your doctor or healthcare professional if your symptoms do not start to get better or if they get worse. What side effects may I notice from receiving this medicine? Side effects that you should report to your doctor or health care professional as soon as possible:  allergic reactions like skin rash, itching or hives, swelling of the face, lips, or tongue Side effects that usually do not require medical attention (report these to your doctor or health care professional if they continue or are bothersome):  pain, redness, or irritation at site where injected This list may not describe all possible side effects. Call your doctor for medical advice about side effects. You may report side effects to FDA at 1-800-FDA-1088. Where should I keep my medicine? Keep out of the reach of children. You will be instructed on how to store this medicine. Throw away any unused medicine after the expiration date on the label. NOTE: This sheet is a summary. It may not cover all possible information. If you have questions about this medicine, talk to your doctor, pharmacist, or health care provider.  2021 Elsevier/Gold Standard (2017-11-27 12:03:23)

## 2020-11-16 ENCOUNTER — Other Ambulatory Visit: Payer: Self-pay

## 2020-11-16 ENCOUNTER — Ambulatory Visit (INDEPENDENT_AMBULATORY_CARE_PROVIDER_SITE_OTHER): Payer: Medicare HMO | Admitting: Podiatry

## 2020-11-16 ENCOUNTER — Ambulatory Visit (INDEPENDENT_AMBULATORY_CARE_PROVIDER_SITE_OTHER): Payer: Medicare HMO

## 2020-11-16 DIAGNOSIS — M65171 Other infective (teno)synovitis, right ankle and foot: Secondary | ICD-10-CM

## 2020-11-16 DIAGNOSIS — M79671 Pain in right foot: Secondary | ICD-10-CM | POA: Diagnosis not present

## 2020-11-16 DIAGNOSIS — S9001XS Contusion of right ankle, sequela: Secondary | ICD-10-CM | POA: Diagnosis not present

## 2020-11-16 DIAGNOSIS — S9000XA Contusion of unspecified ankle, initial encounter: Secondary | ICD-10-CM

## 2020-11-16 DIAGNOSIS — M659 Synovitis and tenosynovitis, unspecified: Secondary | ICD-10-CM

## 2020-11-16 LAB — BASIC METABOLIC PANEL
BUN/Creatinine Ratio: 14 (ref 12–28)
BUN: 13 mg/dL (ref 8–27)
CO2: 25 mmol/L (ref 20–29)
Calcium: 9.3 mg/dL (ref 8.7–10.3)
Chloride: 101 mmol/L (ref 96–106)
Creatinine, Ser: 0.94 mg/dL (ref 0.57–1.00)
Glucose: 88 mg/dL (ref 65–99)
Potassium: 4.7 mmol/L (ref 3.5–5.2)
Sodium: 141 mmol/L (ref 134–144)
eGFR: 67 mL/min/{1.73_m2} (ref >59)

## 2020-11-16 LAB — SEDIMENTATION RATE: Sed Rate: 17 mm/hr (ref 0–40)

## 2020-11-16 LAB — C-REACTIVE PROTEIN: CRP: 8 mg/L (ref 0–10)

## 2020-11-16 MED ORDER — MELOXICAM 15 MG PO TABS: 15.0000 mg | ORAL_TABLET | Freq: Every day | ORAL | 1 refills | Status: DC

## 2020-11-16 MED ORDER — BETAMETHASONE SOD PHOS & ACET 6 (3-3) MG/ML IJ SUSP
3.0000 mg | Freq: Once | INTRAMUSCULAR | Status: AC
  Administered 2020-11-16: 3 mg via INTRA_ARTICULAR

## 2020-11-16 NOTE — Progress Notes (Signed)
   Subjective:  70 y.o. female presenting today for new complaint regarding right ankle pain is been going on for the past month.  She denies a history of injury. She has had increasing pain and tenderness to the right ankle over the last 3 to 4 weeks.  Aggravated by walking.  She presents for further treatment and evaluation   Past Medical History:  Diagnosis Date  . Anemia 2001   after gastric bypass  . Anxiety   . Aortic atherosclerosis ([place])   . Arthritis   . Bipolar 1 disorder ([place])   . Breast cancer ([place]) 2013   Breast cancer    Radiation therapy  . Calcified granuloma of lung ([place])   . Cigarette nicotine dependence   . Cigarette nicotine dependence in remission   . Cocaine dependence in remission ([place])   . COPD (chronic obstructive pulmonary disease) ([place])   . Depression   . Disequilibrium   . Fibromyalgia   . GERD (gastroesophageal reflux disease)   . HSV infection   . Hypertension   . Hypertensive kidney disease with stage 3a chronic kidney disease ([place])   . Hypertensive kidney disease with stage 3b chronic kidney disease ([place])   . Personal history of infectious disease   . Personal history of radiation therapy 2013  . Pneumonia   . Pre-diabetes   . PTSD (post-traumatic stress disorder)   . Seasonal allergic rhinitis due to pollen   . Sleep apnea    uses CPAP  . Vitamin D deficiency      Objective / Physical Exam:  General:  The patient is alert and oriented x3 in no acute distress. Dermatology:  Skin is warm, dry and supple bilateral lower extremities. Negative for open lesions or macerations. Vascular:  Palpable pedal pulses bilaterally. No edema or erythema noted. Capillary refill within normal limits. Neurological:  Epicritic and protective threshold grossly intact bilaterally.  Musculoskeletal Exam:  Pain on palpation to the anterior lateral medial aspects of the patient's right ankle. Mild edema noted. Range of motion within normal limits to all pedal  and ankle joints bilateral. Muscle strength 5/5 in all groups bilateral.   Radiographic Exam:  Normal osseous mineralization. Joint spaces preserved. No fracture/dislocation/boney destruction.    Assessment: 1.  Synovitis/capsulitis right ankle  Plan of Care:  1. Patient was evaluated. X-Rays reviewed.  2. Injection of 0.5 mL Celestone Soluspan injected in the patient's right ankle. 3.  Ankle brace dispensed.  Wear daily 4.  Prescription for meloxicam 15 mg daily 5.  Return to clinic in 4 weeks   Edrick Kins, DPM [facility]  Dr. Edrick Kins, DPM    [address], [place] [address]                Office [phone]  Fax [phone]

## 2020-11-23 ENCOUNTER — Telehealth: Payer: Self-pay | Admitting: Neurology

## 2020-11-23 NOTE — Telephone Encounter (Signed)
MRI & MRV approved by [facility]. Sent to GI. They will call patient to schedule.  MRI- [id number] (11/23/20- 12/23/20) MRV- [id number] (11/23/20- 12/23/20)

## 2020-11-26 ENCOUNTER — Other Ambulatory Visit: Payer: Medicare HMO

## 2020-12-02 ENCOUNTER — Ambulatory Visit
Admission: RE | Admit: 2020-12-02 | Discharge: 2020-12-02 | Disposition: A | Discharge status: Home / Self Care | Payer: Medicare HMO | Source: Ambulatory Visit | Attending: Neurology | Admitting: Neurology

## 2020-12-02 DIAGNOSIS — U071 COVID-19: Secondary | ICD-10-CM

## 2020-12-02 DIAGNOSIS — R519 Headache, unspecified: Secondary | ICD-10-CM | POA: Diagnosis not present

## 2020-12-02 DIAGNOSIS — G08 Intracranial and intraspinal phlebitis and thrombophlebitis: Secondary | ICD-10-CM

## 2020-12-02 DIAGNOSIS — G8929 Other chronic pain: Secondary | ICD-10-CM

## 2020-12-02 DIAGNOSIS — R29818 Other symptoms and signs involving the nervous system: Secondary | ICD-10-CM

## 2020-12-02 DIAGNOSIS — R200 Anesthesia of skin: Secondary | ICD-10-CM | POA: Diagnosis not present

## 2020-12-02 DIAGNOSIS — R202 Paresthesia of skin: Secondary | ICD-10-CM

## 2020-12-02 DIAGNOSIS — R29898 Other symptoms and signs involving the musculoskeletal system: Secondary | ICD-10-CM

## 2020-12-02 MED ORDER — GADOBENATE DIMEGLUMINE 529 MG/ML IV SOLN
20.0000 mL | Freq: Once | INTRAVENOUS | Status: AC | PRN
  Administered 2020-12-02: 20 mL via INTRAVENOUS

## 2020-12-05 ENCOUNTER — Telehealth: Payer: Self-pay | Admitting: Neurology

## 2020-12-05 NOTE — Telephone Encounter (Signed)
Pt called, based on MRI results, feel need to be seen sooner than 02/16/21 right away. Would like a call from the nurse to discuss a sooner appt.

## 2020-12-05 NOTE — Telephone Encounter (Signed)
Pt called, informed her Dr. Jaynee Eagles will review MRI results and nurse will call you back.

## 2020-12-05 NOTE — Telephone Encounter (Signed)
MRI results to be reviewed by Dr Jaynee Eagles. Will get back to patient as soon as possible.

## 2020-12-06 NOTE — Telephone Encounter (Signed)
Melvenia Beam, MD  12/05/2020  5:44 PM EDT Back to Top     Shoua Ulloa please call and let her know that Patient's MRI unremarkable for age. There is an incidental cavernous angioma that she has had since birth, these are usually congenital. It is a group of tightly packed, small blood vessels. There is no blood associated with hers (it has not bled or leaked) and it is not in a location that corresponds to her symptoms or could cause her symptoms.I think it is incidental. We can discuss at appointment. thanks

## 2020-12-06 NOTE — Telephone Encounter (Signed)
Spoke with patient and discussed MRI brain results.  Patient aware there was an incidental finding of the cavernous angioma that is likely congenital.  Patient's questions were answered and she was scheduled for sooner appointment 7/20 @ 11:00.  She states she has had this headache for 5 months and does not wish to wait until August.

## 2020-12-21 ENCOUNTER — Ambulatory Visit: Payer: Medicare HMO | Admitting: Podiatry

## 2020-12-28 ENCOUNTER — Other Ambulatory Visit: Payer: Self-pay | Admitting: Student

## 2020-12-30 ENCOUNTER — Other Ambulatory Visit: Payer: Self-pay | Admitting: Student

## 2020-12-30 DIAGNOSIS — R9389 Abnormal findings on diagnostic imaging of other specified body structures: Secondary | ICD-10-CM

## 2021-01-03 ENCOUNTER — Other Ambulatory Visit: Payer: Self-pay | Admitting: Student

## 2021-01-03 DIAGNOSIS — M7989 Other specified soft tissue disorders: Secondary | ICD-10-CM

## 2021-01-05 ENCOUNTER — Ambulatory Visit: Payer: Medicare HMO | Admitting: Cardiology

## 2021-01-05 ENCOUNTER — Encounter: Payer: Self-pay | Admitting: Cardiology

## 2021-01-05 ENCOUNTER — Other Ambulatory Visit: Payer: Self-pay

## 2021-01-05 VITALS — BP 124/65 | HR 76 | Temp 98.1°F | Resp 16 | Ht 66.0 in | Wt 250.6 lb

## 2021-01-05 DIAGNOSIS — R072 Precordial pain: Secondary | ICD-10-CM

## 2021-01-05 DIAGNOSIS — R0609 Other forms of dyspnea: Secondary | ICD-10-CM

## 2021-01-05 DIAGNOSIS — K2100 Gastro-esophageal reflux disease with esophagitis, without bleeding: Secondary | ICD-10-CM

## 2021-01-05 DIAGNOSIS — F419 Anxiety disorder, unspecified: Secondary | ICD-10-CM

## 2021-01-05 DIAGNOSIS — D219 Benign neoplasm of connective and other soft tissue, unspecified: Secondary | ICD-10-CM

## 2021-01-05 DIAGNOSIS — F32A Depression, unspecified: Secondary | ICD-10-CM

## 2021-01-05 DIAGNOSIS — Z6841 Body Mass Index (BMI) 40.0 and over, adult: Secondary | ICD-10-CM

## 2021-01-05 DIAGNOSIS — G4733 Obstructive sleep apnea (adult) (pediatric): Secondary | ICD-10-CM

## 2021-01-05 DIAGNOSIS — F17210 Nicotine dependence, cigarettes, uncomplicated: Secondary | ICD-10-CM

## 2021-01-05 DIAGNOSIS — Z8616 Personal history of COVID-19: Secondary | ICD-10-CM

## 2021-01-05 DIAGNOSIS — Z9989 Dependence on other enabling machines and devices: Secondary | ICD-10-CM

## 2021-01-05 DIAGNOSIS — I10 Essential (primary) hypertension: Secondary | ICD-10-CM

## 2021-01-05 DIAGNOSIS — R0600 Dyspnea, unspecified: Secondary | ICD-10-CM

## 2021-01-05 DIAGNOSIS — R7303 Prediabetes: Secondary | ICD-10-CM

## 2021-01-05 DIAGNOSIS — F1911 Other psychoactive substance abuse, in remission: Secondary | ICD-10-CM

## 2021-01-05 DIAGNOSIS — Z853 Personal history of malignant neoplasm of breast: Secondary | ICD-10-CM

## 2021-01-05 NOTE — Progress Notes (Signed)
Date:  01/05/2021   ID:  [name], DOB Jun 05, 1951, MRN [id number]  PCP:  Cipriano Mile, NP  Cardiologist:  Rex Kras, DO, [facility] (established care 01/05/2021)  REASON FOR CONSULT: Substernal discomfort  REQUESTING PHYSICIAN:  Cipriano Mile, NP [address] [place],  [place] [address]  Chief Complaint  Patient presents with   Substernal discomfort   New Patient (Initial Visit)    HPI  Rachel Woodard is a 70 y.o. 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  She is referred to the office at the request of Cipriano Mile, NP for evaluation of substernal discomfort.  Substernal chest discomfort: Patient states that for the last 2 weeks has been experiencing substernal discomfort and also having difficulty swallowing.  In May she did undergo endoscopy and was found to have Candida esophagitis and her medications were uptitrated, started on Protonix and increased Carafate.  However despite these changes she continues to have substernal discomfort intermittently.  The symptoms are at times brought on by effort related activities and gets better with resting.  They last for about 10 to 15 minutes.  No use of sublingual nitroglycerin tablets or aspirin.  She also experiences dyspnea on exertion.  Has been present for the last couple months.  Worse with effort related activities and gets better with resting.  And has noticed some lower  extremity swelling.  Currently she denies any active chest pain at today's encounter.  FUNCTIONAL STATUS: Goes off for a walk at least once a week and is able to walk 2 miles in 20 minutes.    ALLERGIES: Allergies  Allergen Reactions   Klonopin [Clonazepam] Anaphylaxis   Lisinopril Anaphylaxis   Metformin And Related Diarrhea    MEDICATION LIST PRIOR TO VISIT: Current Meds  Medication Sig   Blood Pressure Monitor DEVI Use as directed to check home blood pressure 2-3 times a week   carvedilol (COREG) 3.125 MG tablet TAKE 1 TABLET (3.125 MG TOTAL) BY MOUTH 2 (TWO) TIMES DAILY WITH A MEAL.   cholecalciferol (VITAMIN D3) 25 MCG (1000 UNIT) tablet Take 1,000 Units by mouth daily.   escitalopram (LEXAPRO) 5 MG tablet Take 5 mg by mouth at bedtime.   hydrochlorothiazide (HYDRODIURIL) 25 MG tablet TAKE 1 TABLET (25 MG TOTAL) BY MOUTH DAILY.   lamoTRIgine (LAMICTAL) 200 MG tablet Take 200 mg by mouth daily.   meloxicam (MOBIC) 15 MG tablet Take 1 tablet (15 mg total) by mouth daily.   pantoprazole (PROTONIX) 40 MG tablet Take 40 mg by mouth every morning.   QUEtiapine (SEROQUEL) 200 MG tablet Take 200 mg by mouth at bedtime.   sucralfate (CARAFATE) 1 g tablet 1 tablet on an empty stomach   vitamin C (ASCORBIC ACID) 500 MG tablet Take 500 mg by mouth daily.     PAST MEDICAL HISTORY: Past Medical History:  Diagnosis Date   Anemia 2001   after gastric bypass   Anxiety    Aortic atherosclerosis (HCC)    Arthritis    Bipolar 1 disorder ([place])    Breast cancer ([place]) 2013  Breast cancer    Radiation therapy   Calcified granuloma of lung (HCC)    Cigarette nicotine dependence    Cigarette nicotine dependence in remission    Cocaine dependence in remission (HCC)    COPD (chronic obstructive pulmonary disease) (HCC)    Depression    Disequilibrium    Fibromyalgia    GERD (gastroesophageal reflux disease)    HSV infection    Hypertension    Hypertensive kidney disease with stage 3a  chronic kidney disease (HCC)    Hypertensive kidney disease with stage 3b chronic kidney disease ([place])    Personal history of infectious disease    Personal history of radiation therapy 2013   Pneumonia    Pre-diabetes    PTSD (post-traumatic stress disorder)    Seasonal allergic rhinitis due to pollen    Sleep apnea    uses CPAP   Vitamin D deficiency     PAST SURGICAL HISTORY: Past Surgical History:  Procedure Laterality Date   ABDOMINAL HYSTERECTOMY  2018   BREAST LUMPECTOMY Right 2013   GASTRIC BYPASS  2001   TOTAL KNEE ARTHROPLASTY Left 12/22/2018   Procedure: LEFT TOTAL KNEE ARTHROPLASTY;  Surgeon: Leandrew Koyanagi, MD;  Location: [facility];  Service: Orthopedics;  Laterality: Left;    FAMILY HISTORY: The patient family history includes Cancer in an other family member; Diabetes in an other family member; Leukemia in her mother; Liver disease in her brother; Prostate cancer in her father.  SOCIAL HISTORY:  The patient  reports that she has been smoking cigarettes. She started smoking about 21 years ago. She has a 10.00 pack-year smoking history. She has never used smokeless tobacco. She reports previous alcohol use. She reports that she does not use drugs.  REVIEW OF SYSTEMS: Review of Systems  Constitutional: Negative for chills and fever.  HENT:  Negative for hoarse voice and nosebleeds.   Eyes:  Negative for discharge, double vision and pain.  Cardiovascular:  Positive for dyspnea on exertion and leg swelling. Negative for chest pain, claudication, near-syncope, orthopnea, palpitations, paroxysmal nocturnal dyspnea and syncope.  Respiratory:  Negative for hemoptysis and shortness of breath.   Musculoskeletal:  Negative for muscle cramps and myalgias.  Gastrointestinal:  Negative for abdominal pain, constipation, diarrhea, hematemesis, hematochezia, melena, nausea and vomiting.  Neurological:  Negative for dizziness and light-headedness.   PHYSICAL EXAM: Vitals with BMI  01/05/2021 11/15/2020 08/30/2020  Height _0  _1  _2   Weight 250 lbs 10 oz 246 lbs 233 lbs 13 oz  BMI 40.47 80.88 11.03  Systolic 159 458 592  Diastolic 65 81 62  Pulse 76 63 71  Some encounter information is confidential and restricted. Go to Review Flowsheets activity to see all data.    CONSTITUTIONAL: Well-developed and well-nourished. No acute distress.  SKIN: Skin is warm and dry. No rash noted. No cyanosis. No pallor. No jaundice HEAD: Normocephalic and atraumatic.  EYES: No scleral icterus MOUTH/THROAT: Moist oral membranes.  NECK: No JVD present. No thyromegaly noted. No carotid bruits  LYMPHATIC: No visible cervical adenopathy.  CHEST Normal respiratory effort. No intercostal retractions  LUNGS: Clear to auscultation bilaterally. No stridor. No wheezes. No rales.  CARDIOVASCULAR: Regular rate and rhythm, positive S1-S2, no murmurs rubs or gallops appreciated. ABDOMINAL: Obese, soft, nontender, nondistended, positive bowel sounds all 4 quadrants.No apparent ascites.  EXTREMITIES: No peripheral edema, +2 DP and PT bilaterally.  HEMATOLOGIC: No significant bruising NEUROLOGIC: Oriented to person, place, and time. Nonfocal. Normal muscle tone.  PSYCHIATRIC:  Normal mood and affect. Normal behavior. Cooperative  CARDIAC DATABASE: EKG: 01/05/2021: Normal sinus rhythm, 71 bpm, left axis deviation, left anterior fascicular block, without underlying ischemia or injury pattern.  Echocardiogram: No results found for this or any previous visit from the past 1095 days.   Stress Testing: No results found for this or any previous visit from the past 1095 days.  Heart Catheterization: None  Carotid duplex: Pending  LABORATORY DATA: CBC Latest Ref Rng & Units 12/23/2018 12/16/2018 01/22/2018  WBC 4.0 - 10.5 K/uL 8.7 7.2 7.7  Hemoglobin 12.0 - 15.0 g/dL 12.1 15.1(H) 14.8  Hematocrit 36.0 - 46.0 % 37.9 48.0(H) 45.7  Platelets 150 - 400 K/uL 187 214 231    CMP Latest Ref Rng &  Units 11/15/2020 12/23/2018 12/16/2018  Glucose 65 - 99 mg/dL 88 115(H) 90  BUN 8 - 27 mg/dL _0 Creatinine 0.57 - 1.00 mg/dL 0.94 1.36(H) 0.89  Sodium 134 - 144 mmol/L 141 134(L) 140  Potassium 3.5 - 5.2 mmol/L 4.7 4.2 3.9  Chloride 96 - 106 mmol/L 101 101 104  CO2 20 - 29 mmol/L _1 Calcium 8.7 - 10.3 mg/dL 9.3 8.2(L) 9.2  Total Protein 6.5 - 8.1 g/dL - - 8.1  Total Bilirubin 0.3 - 1.2 mg/dL - - 0.9  Alkaline Phos 38 - 126 U/L - - 101  AST 15 - 41 U/L - - 20  ALT 0 - 44 U/L - - 20    Lipid Panel     Component Value Date/Time   CHOL 163 07/11/2017 1107   TRIG 69 07/11/2017 1107   HDL 52 07/11/2017 1107   CHOLHDL 3.1 07/11/2017 1107   CHOLHDL 3.0 08/08/2016 1102   VLDL 17 08/08/2016 1102   LDLCALC 97 07/11/2017 1107   LABVLDL 14 07/11/2017 1107    No components found for: NTPROBNP No results for input(s): PROBNP in the last 8760 hours. No results for input(s): TSH in the last 8760 hours.  BMP Recent Labs    11/15/20 1337  NA 141  K 4.7  CL 101  CO2 25  GLUCOSE 88  BUN 13  CREATININE 0.94  CALCIUM 9.3    HEMOGLOBIN A1C Lab Results  Component Value Date   HGBA1C 5.7 (H) 11/11/2018   External Labs: 12/21/2020 provided by primary care provider Collected:  Creatinine 1.17 mg/dL. eGFR: 51 mL/min per 1.73 m Potassium 4.6. AST 14, ALT 14, alkaline phosphatase 85 Hemoglobin 13.4 g/dL, hematocrit 40.8%  IMPRESSION:    ICD-10-CM   1. Precordial pain  R07.2 EKG 12-Lead    PCV ECHOCARDIOGRAM COMPLETE    CT CARDIAC SCORING ([facility] LOCATIONS ONLY)    PCV CARDIAC STRESS TEST    2. Dyspnea on exertion  R06.00 PCV ECHOCARDIOGRAM COMPLETE    Basic metabolic panel    Pro b natriuretic peptide (BNP)    Magnesium    3. OSA on CPAP  G47.33    Z99.89     4. Essential hypertension  I10     5. 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 Gastroesophageal reflux disease with esophagitis, unspecified  whether hemorrhage  K21.00     7. Pre-diabetes  R73.03     8. HX: breast cancer  Z85.3     9. Anxiety and depression  F41.9    F32.A     10. History of COVID-19  Z86.16     11. Substance abuse in  remission ([place])  F19.11     12. Cigarette smoker  F17.210     13. Class 3 severe obesity due to excess calories without serious comorbidity with body mass index (BMI) of 40.0 to 44.9 in adult [facility])  E66.01    Z68.41        RECOMMENDATIONS: Cellie Dardis is a 70 y.o. 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  Precordial pain: Symptoms suggestive of possible cardiac etiology given multiple cardiovascular risk factors as outlined above. I suspect it may be secondary to her underlying dysphagia, vasospasms due to nicotine use, however CAD cannot be ruled out. Has undergone extensive GI work-up as noted above. Echocardiogram will be ordered to evaluate for structural heart disease and left ventricular systolic function. Plan exercise treadmill stress test to evaluate for exercise-induced ischemia. Coronary calcium score for further restratification.  Dyspnea on exertion: Appears to be overall euvolemic on physical examination low probability of congestive heart failure. Check BMP, BNP and magnesium level. Plan echo as outlined above. Educated on the importance of complete smoking cessation. Continue work-up for noncardiac causes of dyspnea with PCP  Cigarette smoking:  Tobacco cessation counseling: Currently smoking 0.5 packs/day   Patient was  informed of the dangers of tobacco abuse including stroke, cancer, and MI, as well as benefits of tobacco cessation. Patient is not willing to quit at this time. Approximately 7 mins were spent counseling patient cessation techniques. We discussed various methods to help quit smoking, including deciding on a date to quit, joining a support group, pharmacological agents- nicotine gum/patch/lozenges, chantix.  I will reassess her progress at the next follow-up visit  Obstructive sleep apnea on CPAP: Patient endorses compliance with her CPAP machine.  Benign essential hypertension: Blood pressure at today's office visit is currently at goal Medications reconciled Low-salt diet recommended Currently managed by primary care provider.  Granular cell tumor of the esophagus status post intervention: Currently follows with PCP and gastroenterology.  FINAL MEDICATION LIST END OF ENCOUNTER: No orders of the defined types were placed in this encounter.   There are no discontinued medications.   Current Outpatient Medications:    Blood Pressure Monitor DEVI, Use as directed to check home blood pressure 2-3 times a week, Disp: 1 Device, Rfl: 0   carvedilol (COREG) 3.125 MG tablet, TAKE 1 TABLET (3.125 MG TOTAL) BY MOUTH 2 (TWO) TIMES DAILY WITH A MEAL., Disp: 180 tablet, Rfl: 0   cholecalciferol (VITAMIN D3) 25 MCG (1000 UNIT) tablet, Take 1,000 Units by mouth daily., Disp: , Rfl:    escitalopram (LEXAPRO) 5 MG tablet, Take 5 mg by mouth at bedtime., Disp: , Rfl:    hydrochlorothiazide (HYDRODIURIL) 25 MG tablet, TAKE 1 TABLET (25 MG TOTAL) BY MOUTH DAILY., Disp: 90 tablet, Rfl: 0   lamoTRIgine (LAMICTAL) 200 MG tablet, Take 200 mg by mouth daily., Disp: , Rfl:    meloxicam (MOBIC) 15 MG tablet, Take 1 tablet (15 mg total) by mouth daily., Disp: 30 tablet, Rfl: 1   pantoprazole (PROTONIX) 40 MG tablet, Take 40 mg by mouth every morning., Disp: , Rfl:    QUEtiapine (SEROQUEL) 200 MG tablet, Take 200 mg  by mouth at bedtime.,  Disp: , Rfl:    sucralfate (CARAFATE) 1 g tablet, 1 tablet on an empty stomach, Disp: , Rfl:    vitamin C (ASCORBIC ACID) 500 MG tablet, Take 500 mg by mouth daily., Disp: , Rfl:   Orders Placed This Encounter  Procedures   CT CARDIAC SCORING ([facility] LOCATIONS ONLY)   Basic metabolic panel   Pro b natriuretic peptide (BNP)   Magnesium   PCV CARDIAC STRESS TEST   EKG 12-Lead   PCV ECHOCARDIOGRAM COMPLETE    There are no Patient Instructions on file for this visit.   --Continue cardiac medications as reconciled in final medication list. --Return in about 4 weeks (around 02/02/2021) for Follow up substernal chest pain and , Review test results. Or sooner if needed. --Continue follow-up with your primary care physician regarding the management of your other chronic comorbid conditions.  Patient's questions and concerns were addressed to her satisfaction. She voices understanding of the instructions provided during this encounter.   This note was created using a voice recognition software as a result there may be grammatical errors inadvertently enclosed that do not reflect the nature of this encounter. Every attempt is made to correct such errors.  Rex Kras, [place], [facility]  Pager: [phone] Office: [phone]

## 2021-01-06 ENCOUNTER — Ambulatory Visit
Admission: RE | Admit: 2021-01-06 | Discharge: 2021-01-06 | Disposition: A | Discharge status: Home / Self Care | Payer: Medicare HMO | Source: Ambulatory Visit | Attending: Student | Admitting: Student

## 2021-01-06 DIAGNOSIS — R9389 Abnormal findings on diagnostic imaging of other specified body structures: Secondary | ICD-10-CM

## 2021-01-09 ENCOUNTER — Ambulatory Visit
Admission: RE | Admit: 2021-01-09 | Discharge: 2021-01-09 | Disposition: A | Discharge status: Home / Self Care | Payer: Medicare HMO | Source: Ambulatory Visit | Attending: Student | Admitting: Student

## 2021-01-09 DIAGNOSIS — M7989 Other specified soft tissue disorders: Secondary | ICD-10-CM

## 2021-01-11 ENCOUNTER — Ambulatory Visit: Payer: Self-pay | Admitting: Neurology

## 2021-01-13 ENCOUNTER — Other Ambulatory Visit: Payer: Self-pay | Admitting: Podiatry

## 2021-01-30 ENCOUNTER — Other Ambulatory Visit: Payer: Medicare HMO

## 2021-02-06 ENCOUNTER — Other Ambulatory Visit: Payer: Self-pay | Admitting: Podiatry

## 2021-02-06 NOTE — Telephone Encounter (Signed)
Please advise 

## 2021-02-07 ENCOUNTER — Telehealth: Payer: Self-pay | Admitting: Cardiology

## 2021-02-07 NOTE — Telephone Encounter (Signed)
Called PT to sch her for the multiple test Dr. Terri Skains had ordered along with r/s the office visit she cancelled, pt stated she wouldn't be coming back and that she wasn't being seen by another cardiologist because she feels that nothing is wrong with her heart.

## 2021-02-10 ENCOUNTER — Other Ambulatory Visit: Payer: Medicare HMO

## 2021-02-16 ENCOUNTER — Ambulatory Visit: Payer: Medicare HMO | Admitting: Cardiology

## 2021-02-16 ENCOUNTER — Ambulatory Visit: Payer: Medicare HMO | Admitting: Family Medicine

## 2021-02-20 ENCOUNTER — Encounter: Payer: Self-pay | Admitting: Obstetrics and Gynecology

## 2021-02-20 ENCOUNTER — Ambulatory Visit (INDEPENDENT_AMBULATORY_CARE_PROVIDER_SITE_OTHER): Payer: Medicare HMO | Admitting: Obstetrics and Gynecology

## 2021-02-20 ENCOUNTER — Other Ambulatory Visit: Payer: Self-pay

## 2021-02-20 VITALS — BP 107/74 | HR 78 | Ht 66.0 in | Wt 257.9 lb

## 2021-02-20 DIAGNOSIS — A6004 Herpesviral vulvovaginitis: Secondary | ICD-10-CM | POA: Diagnosis not present

## 2021-02-20 NOTE — Patient Instructions (Signed)
Health Maintenance, Female Adopting a healthy lifestyle and getting preventive care are important in promoting health and wellness. Ask your health care provider about: The right schedule for you to have regular tests and exams. Things you can do on your own to prevent diseases and keep yourself healthy. What should I know about diet, weight, and exercise? Eat a healthy diet  Eat a diet that includes plenty of vegetables, fruits, low-fat dairy products, and lean protein. Do not eat a lot of foods that are high in solid fats, added sugars, or sodium.  Maintain a healthy weight Body mass index (BMI) is used to identify weight problems. It estimates body fat based on height and weight. Your health care provider can help determineyour BMI and help you achieve or maintain a healthy weight. Get regular exercise Get regular exercise. This is one of the most important things you can do for your health. Most adults should: Exercise for at least 150 minutes each week. The exercise should increase your heart rate and make you sweat (moderate-intensity exercise). Do strengthening exercises at least twice a week. This is in addition to the moderate-intensity exercise. Spend less time sitting. Even light physical activity can be beneficial. Watch cholesterol and blood lipids Have your blood tested for lipids and cholesterol at 68 years of age, then havethis test every 5 years. Have your cholesterol levels checked more often if: Your lipid or cholesterol levels are high. You are older than 68 years of age. You are at high risk for heart disease. What should I know about cancer screening? Depending on your health history and family history, you may need to have cancer screening at various ages. This may include screening for: Breast cancer. Cervical cancer. Colorectal cancer. Skin cancer. Lung cancer. What should I know about heart disease, diabetes, and high blood pressure? Blood pressure and heart  disease High blood pressure causes heart disease and increases the risk of stroke. This is more likely to develop in people who have high blood pressure readings, are of African descent, or are overweight. Have your blood pressure checked: Every 3-5 years if you are 18-39 years of age. Every year if you are 40 years old or older. Diabetes Have regular diabetes screenings. This checks your fasting blood sugar level. Have the screening done: Once every three years after age 40 if you are at a normal weight and have a low risk for diabetes. More often and at a younger age if you are overweight or have a high risk for diabetes. What should I know about preventing infection? Hepatitis B If you have a higher risk for hepatitis B, you should be screened for this virus. Talk with your health care provider to find out if you are at risk forhepatitis B infection. Hepatitis C Testing is recommended for: Everyone born from 1945 through 1965. Anyone with known risk factors for hepatitis C. Sexually transmitted infections (STIs) Get screened for STIs, including gonorrhea and chlamydia, if: You are sexually active and are younger than 68 years of age. You are older than 68 years of age and your health care provider tells you that you are at risk for this type of infection. Your sexual activity has changed since you were last screened, and you are at increased risk for chlamydia or gonorrhea. Ask your health care provider if you are at risk. Ask your health care provider about whether you are at high risk for HIV. Your health care provider may recommend a prescription medicine to help   prevent HIV infection. If you choose to take medicine to prevent HIV, you should first get tested for HIV. You should then be tested every 3 months for as long as you are taking the medicine. Pregnancy If you are about to stop having your period (premenopausal) and you may become pregnant, seek counseling before you get  pregnant. Take 400 to 800 micrograms (mcg) of folic acid every day if you become pregnant. Ask for birth control (contraception) if you want to prevent pregnancy. Osteoporosis and menopause Osteoporosis is a disease in which the bones lose minerals and strength with aging. This can result in bone fractures. If you are 65 years old or older, or if you are at risk for osteoporosis and fractures, ask your health care provider if you should: Be screened for bone loss. Take a calcium or vitamin D supplement to lower your risk of fractures. Be given hormone replacement therapy (HRT) to treat symptoms of menopause. Follow these instructions at home: Lifestyle Do not use any products that contain nicotine or tobacco, such as cigarettes, e-cigarettes, and chewing tobacco. If you need help quitting, ask your health care provider. Do not use street drugs. Do not share needles. Ask your health care provider for help if you need support or information about quitting drugs. Alcohol use Do not drink alcohol if: Your health care provider tells you not to drink. You are pregnant, may be pregnant, or are planning to become pregnant. If you drink alcohol: Limit how much you use to 0-1 drink a day. Limit intake if you are breastfeeding. Be aware of how much alcohol is in your drink. In the [place] one drink equals one 12 oz bottle of beer (355 mL), one 5 oz glass of wine (148 mL), or one 1 oz glass of hard liquor (44 mL). General instructions Schedule regular health, dental, and eye exams. Stay current with your vaccines. Tell your health care provider if: You often feel depressed. You have ever been abused or do not feel safe at home. Summary Adopting a healthy lifestyle and getting preventive care are important in promoting health and wellness. Follow your health care provider's instructions about healthy diet, exercising, and getting tested or screened for diseases. Follow your health care provider's  instructions on monitoring your cholesterol and blood pressure. This information is not intended to replace advice given to you by your health care provider. Make sure you discuss any questions you have with your healthcare provider. Document Revised: 06/04/2018 Document Reviewed: 06/04/2018 Elsevier Patient Education  2022 [employer]vier Inc.  

## 2021-02-20 NOTE — Progress Notes (Signed)
Rachel Woodard presents for evaluation and questions about HSV. Pt reports Dx with HSV in 2014. No out breaks until 2 months ago. Treated by PCP and placed on suppression for 1 yr. She presently has no HSV lesions or GYN concerns. Pap smear and mammogram are up to date and in [place].  PE AF VSS Lungs clear Heart RRR Abd soft + BS GU Nl EGBUS no HSV lesions noted.   A/P H/O HSV  Pt reassured that today's exam is normal. Agree with suppression therapy x 1 yr. F/U PRN

## 2021-03-02 ENCOUNTER — Encounter (INDEPENDENT_AMBULATORY_CARE_PROVIDER_SITE_OTHER): Payer: Self-pay

## 2021-03-02 ENCOUNTER — Other Ambulatory Visit: Payer: Self-pay | Admitting: Podiatry

## 2021-03-02 ENCOUNTER — Ambulatory Visit (INDEPENDENT_AMBULATORY_CARE_PROVIDER_SITE_OTHER): Payer: Medicare HMO | Admitting: Family Medicine

## 2021-03-14 ENCOUNTER — Encounter (INDEPENDENT_AMBULATORY_CARE_PROVIDER_SITE_OTHER): Payer: Self-pay | Admitting: Family Medicine

## 2021-03-14 ENCOUNTER — Other Ambulatory Visit: Payer: Self-pay

## 2021-03-14 ENCOUNTER — Ambulatory Visit (INDEPENDENT_AMBULATORY_CARE_PROVIDER_SITE_OTHER): Payer: Medicare HMO | Admitting: Family Medicine

## 2021-03-14 VITALS — BP 112/70 | HR 75 | Temp 97.7°F | Ht 66.0 in | Wt 258.0 lb

## 2021-03-14 DIAGNOSIS — R739 Hyperglycemia, unspecified: Secondary | ICD-10-CM | POA: Diagnosis not present

## 2021-03-14 DIAGNOSIS — K219 Gastro-esophageal reflux disease without esophagitis: Secondary | ICD-10-CM

## 2021-03-14 DIAGNOSIS — R0602 Shortness of breath: Secondary | ICD-10-CM | POA: Diagnosis not present

## 2021-03-14 DIAGNOSIS — F3170 Bipolar disorder, currently in remission, most recent episode unspecified: Secondary | ICD-10-CM

## 2021-03-14 DIAGNOSIS — Z720 Tobacco use: Secondary | ICD-10-CM

## 2021-03-14 DIAGNOSIS — I10 Essential (primary) hypertension: Secondary | ICD-10-CM

## 2021-03-14 DIAGNOSIS — E66813 Obesity, class 3: Secondary | ICD-10-CM

## 2021-03-14 DIAGNOSIS — Z9884 Bariatric surgery status: Secondary | ICD-10-CM

## 2021-03-14 DIAGNOSIS — R5383 Other fatigue: Secondary | ICD-10-CM | POA: Diagnosis not present

## 2021-03-14 DIAGNOSIS — G4733 Obstructive sleep apnea (adult) (pediatric): Secondary | ICD-10-CM

## 2021-03-14 DIAGNOSIS — Z1331 Encounter for screening for depression: Secondary | ICD-10-CM

## 2021-03-14 DIAGNOSIS — E559 Vitamin D deficiency, unspecified: Secondary | ICD-10-CM

## 2021-03-14 DIAGNOSIS — Z6841 Body Mass Index (BMI) 40.0 and over, adult: Secondary | ICD-10-CM

## 2021-03-14 NOTE — Progress Notes (Signed)
Dear Dr. Romana Woodard,   Thank you for referring Rachel Woodard to our clinic. The following note includes my evaluation and treatment recommendations.  Chief Complaint:   OBESITY Rachel Woodard (MR# [id number]) is a 70 y.o. female who presents for evaluation and treatment of obesity and related comorbidities. Current BMI is Body mass index is 41.64 kg/m. Rachel Woodard has been struggling with her weight for many years and has been unsuccessful in either losing weight, maintaining weight loss, or reaching her healthy weight goal.  Rachel Woodard is currently in the action stage of change and ready to dedicate time achieving and maintaining a healthier weight. Rachel Woodard is interested in becoming our patient and working on intensive lifestyle modifications including (but not limited to) diet and exercise for weight loss.  Referred by Rachel Juniper, MD. Pt has history of Gastric Bypass in 2001. She starts eating at 10:30-11 AM. Rachel Woodard wakes up with coffee + 1 tbsp sugar + 2 tbsp creamer, cottage cheese (1 cup) with strawberries (1/4 cup) with 1/4 cup granola, sausage + cheese English Muffin or cinnamon raisin bagel with oatmeal or grits + couple strips bacon, smoothie- celery, spinach, cucumber, mango, pineapple, blueberries, 1/2 cup oatmeal; 6-7 PM- 1 cup peas, 1 cup mac n' cheese, 2 fried chicken wings (felt full); 1 slice homemade carrot cake bread.  Rachel Woodard's habits were reviewed today and are as follows: her desired weight loss is 58 lbs, she started gaining weight in 2003, her heaviest weight ever was 286 pounds, she has significant food cravings issues, she snacks frequently in the evenings, she is frequently drinking liquids with calories, she frequently makes poor food choices, she frequently eats larger portions than normal, and she has binge eating behaviors.  Depression Screen Rachel Woodard's Food and Mood (modified PHQ-9) score was 5.  Depression screen Rachel Woodard 2/9 03/14/2021  Decreased  Interest 1  Down, Depressed, Hopeless 0  PHQ - 2 Score 1  Altered sleeping 1  Tired, decreased energy 2  Change in appetite 1  Feeling bad or failure about yourself  0  Trouble concentrating 0  Moving slowly or fidgety/restless 0  Suicidal thoughts 0  PHQ-9 Score 5  Difficult doing work/chores Not difficult at all   Subjective:   1. Other fatigue Rachel Woodard admits to daytime somnolence and admits to waking up still tired. Patent has a history of symptoms of daytime fatigue, morning fatigue, and morning headache. Rachel Woodard generally gets 6 or 7 hours of sleep per night, and states that she has generally restful sleep. Snoring is present. Apneic episodes are present. Epworth Sleepiness Score is 6. EKG normal sinus rhythm at 73 bpm with poor R-wave progression (similar to July 2022).  2. SOB (shortness of breath) Rachel Woodard notes increasing shortness of breath with exercising and seems to be worsening over time with weight gain. She notes getting out of breath sooner with activity than she used to. This has gotten worse recently. Rachel Woodard denies shortness of breath at rest or orthopnea.  3. Essential hypertension BP controlled today. Pt denies chest pain/chest pressure/headache. She is on HCTZ and Carvedilol.  4. Hyperglycemia Rachel Woodard's last A1c was 5.7, and she is not on medication.  5. Bipolar disorder in full remission, most recent episode unspecified type (Rachel Woodard) Pt is on Lamictal, Lexapro, and Seroquel and reports good management.  6. Gastroesophageal reflux disease, unspecified whether esophagitis present Good control of symptom with Protonix and sucralfate.  7. H/O bariatric surgery Surgery February 2001. She reports occasional fullness and is not taking a  multivitamin.  8. Tobacco abuse Smokes 1/2 pack cigarettes a day for the last 20 years.  9. OSA (obstructive sleep apnea) Pt has a CPAP and uses it occasionally.  10. Vitamin D deficiency Diagnosis likely given obesity. Pt is  not on supplementation.  Assessment/Plan:   1. Other fatigue Rachel Woodard does feel that her weight is causing her energy to be lower than it should be. Fatigue may be related to obesity, depression or many other causes. Labs will be ordered, and in the meanwhile, Rachel Woodard will focus on self care including making healthy food choices, increasing physical activity and focusing on stress reduction. Check labs today.  - EKG 12-Lead - T3 - T4, free - TSH  2. SOB (shortness of breath) Rachel Woodard does feel that she gets out of breath more easily that she used to when she exercises. Rachel Woodard's shortness of breath appears to be obesity related and exercise induced. She has agreed to work on weight loss and gradually increase exercise to treat her exercise induced shortness of breath. Will continue to monitor closely.  3. Essential hypertension Rachel Woodard is working on healthy weight loss and exercise to improve blood pressure control. We will watch for signs of hypotension as she continues her lifestyle modifications. Check labs today.  - Comprehensive metabolic panel - Lipid Panel With LDL/HDL Ratio  4. Hyperglycemia Fasting labs will be obtained and results with be discussed with Rachel Woodard in 2 weeks at her follow up visit. In the meanwhile Rachel Woodard was started on a lower simple carbohydrate diet and will work on weight loss efforts.  - Hemoglobin A1c - Insulin, random  5. Bipolar disorder in full remission, most recent episode unspecified type (Rachel Woodard) Follow up at next appt.  6. Gastroesophageal reflux disease, unspecified whether esophagitis present Intensive lifestyle modifications are the first line treatment for this issue. We discussed several lifestyle modifications today and she will continue to work on diet, exercise and weight loss efforts. Orders and follow up as documented in patient record. Follow up at next appt.  Counseling If a person has gastroesophageal reflux disease (GERD), food and  stomach acid move back up into the esophagus and cause symptoms or problems such as damage to the esophagus. Anti-reflux measures include: raising the head of the bed, avoiding tight clothing or belts, avoiding eating late at night, not lying down shortly after mealtime, and achieving weight loss. Avoid ASA, NSAID's, caffeine, alcohol, and tobacco.  OTC Pepcid and/or Tums are often very helpful for as needed use.  However, for persisting chronic or daily symptoms, stronger medications like Omeprazole may be needed. You may need to avoid foods and drinks such as: Coffee and tea (with or without caffeine). Drinks that contain alcohol. Energy drinks and sports drinks. Bubbly (carbonated) drinks or sodas. Chocolate and cocoa. Peppermint and mint flavorings. Garlic and onions. Horseradish. Spicy and acidic foods. These include peppers, chili powder, curry powder, vinegar, hot sauces, and BBQ sauce. Citrus fruit juices and citrus fruits, such as oranges, lemons, and limes. Tomato-based foods. These include red sauce, chili, salsa, and pizza with red sauce. Fried and fatty foods. These include donuts, french fries, potato chips, and high-fat dressings. High-fat meats. These include hot dogs, rib eye steak, sausage, ham, and bacon.  7. H/O bariatric surgery Check labs today.  - Vitamin B12 - Folate - Prealbumin  8. Tobacco abuse Make a quit plan in upcoming appt.  9. OSA (obstructive sleep apnea) Intensive lifestyle modifications are the first line treatment for this issue. We discussed  several lifestyle modifications today and she will continue to work on diet, exercise and weight loss efforts. We will continue to monitor. Orders and follow up as documented in patient record.  Follow up at next appt.   - CBC with Differential/Platelet  10. Vitamin D deficiency Low Vitamin D level contributes to fatigue and are associated with obesity, breast, and colon cancer. She agrees to continue to  take prescription Vitamin D 50,000 IU every week and will follow-up for routine testing of Vitamin D, at least 2-3 times per year to avoid over-replacement. Check labs today.  - VITAMIN D 25 Hydroxy (Vit-D Deficiency, Fractures)  11. Depression screening Rachel Woodard had a positive depression screening. Depression is commonly associated with obesity and often results in emotional eating behaviors. We will monitor this closely and work on CBT to help improve the non-hunger eating patterns. Referral to Psychology may be required if no improvement is seen as she continues in our clinic.  12. Obesity with current BMI of 41.7  Rachel Woodard is currently in the action stage of change and her goal is to continue with weight loss efforts. I recommend Rachel Woodard begin the structured treatment plan as follows:  She has agreed to the Category 3 Plan with 8 oz protein at dinner.  Exercise goals: No exercise has been prescribed at this time.   Behavioral modification strategies: increasing lean protein intake and meal planning and cooking strategies.  She was informed of the importance of frequent follow-up visits to maximize her success with intensive lifestyle modifications for her multiple health conditions. She was informed we would discuss her lab results at her next visit unless there is a critical issue that needs to be addressed sooner. Rachel Woodard agreed to keep her next visit at the agreed upon time to discuss these results.  Objective:   Blood pressure 112/70, pulse 75, temperature 97.7 F (36.5 C), height _0  (1.676 m), weight 258 lb (117 kg), SpO2 97 %. Body mass index is 41.64 kg/m.  EKG: Normal sinus rhythm, rate 73- with R-wave progression (similar to July 2022).  Indirect Calorimeter completed today shows a VO2 of 255 and a REE of 1757.  Her calculated basal metabolic rate is 7673 thus her basal metabolic rate is worse than expected.  General: Cooperative, alert, well developed, in no acute  distress. HEENT: Conjunctivae and lids unremarkable. Cardiovascular: Regular rhythm.  Lungs: Normal work of breathing. Neurologic: No focal deficits.   Lab Results  Component Value Date   CREATININE 0.94 11/15/2020   BUN 13 11/15/2020   NA 141 11/15/2020   K 4.7 11/15/2020   CL 101 11/15/2020   CO2 25 11/15/2020   Lab Results  Component Value Date   ALT 20 12/16/2018   AST 20 12/16/2018   ALKPHOS 101 12/16/2018   BILITOT 0.9 12/16/2018   Lab Results  Component Value Date   HGBA1C 5.7 (H) 11/11/2018   HGBA1C 5.8 (H) 04/24/2018   HGBA1C 5.7 (H) 01/22/2018   HGBA1C 5.9 07/11/2017   HGBA1C 5.5 02/14/2017   No results found for: INSULIN Lab Results  Component Value Date   TSH 1.250 01/22/2018   Lab Results  Component Value Date   CHOL 163 07/11/2017   HDL 52 07/11/2017   LDLCALC 97 07/11/2017   TRIG 69 07/11/2017   CHOLHDL 3.1 07/11/2017   Lab Results  Component Value Date   WBC 8.7 12/23/2018   HGB 12.1 12/23/2018   HCT 37.9 12/23/2018   MCV 87.3 12/23/2018   PLT  187 12/23/2018    Attestation Statements:   Reviewed by clinician on day of visit: allergies, medications, problem list, medical history, surgical history, family history, social history, and previous encounter notes.  Time spent on visit including pre-visit chart review and post-visit charting and care was 60 minutes.   Coral Ceo, CMA, am acting as transcriptionist for Coralie Common, MD.  This is the patient's first visit at [facility]. The patient's NEW PATIENT PACKET was reviewed at length. Included in the packet: current and past health history, medications, allergies, ROS, gynecologic history (women only), surgical history, family history, social history, weight history, weight loss surgery history (for those that have had weight loss surgery), nutritional evaluation, mood and food questionnaire, PHQ9, Epworth questionnaire, sleep habits questionnaire, patient life and  health improvement goals questionnaire. These will all be scanned into the patient's chart under media.   During the visit, I independently reviewed the patient's EKG, bioimpedance scale results, and indirect calorimeter results. I used this information to tailor a meal plan for the patient that will help her to lose weight and will improve her obesity-related conditions going forward. I performed a medically necessary appropriate examination and/or evaluation. I discussed the assessment and treatment plan with the patient. The patient was provided an opportunity to ask questions and all were answered. The patient agreed with the plan and demonstrated an understanding of the instructions. Labs were ordered at this visit and will be reviewed at the next visit unless more critical results need to be addressed immediately. Clinical information was updated and documented in the EMR.   Time spent on visit including pre-visit chart review and post-visit care was 60 minutes.   A separate 15 minutes was spent on risk counseling (see above).  I have reviewed the above documentation for accuracy and completeness, and I agree with the above. - Coralie Common, MD

## 2021-03-14 NOTE — Progress Notes (Signed)
G

## 2021-03-16 ENCOUNTER — Ambulatory Visit (INDEPENDENT_AMBULATORY_CARE_PROVIDER_SITE_OTHER): Payer: Medicare HMO | Admitting: Family Medicine

## 2021-03-16 LAB — LIPID PANEL WITH LDL/HDL RATIO
Cholesterol, Total: 170 mg/dL (ref 100–199)
HDL: 53 mg/dL (ref >39)
LDL Chol Calc (NIH): 99 mg/dL (ref 0–99)
LDL/HDL Ratio: 1.9 ratio (ref 0.0–3.2)
Triglycerides: 99 mg/dL (ref 0–149)
VLDL Cholesterol Cal: 18 mg/dL (ref 5–40)

## 2021-03-16 LAB — VITAMIN D 25 HYDROXY (VIT D DEFICIENCY, FRACTURES): Vit D, 25-Hydroxy: 33 ng/mL (ref 30.0–100.0)

## 2021-03-16 LAB — COMPREHENSIVE METABOLIC PANEL
ALT: 15 IU/L (ref 0–32)
AST: 17 IU/L (ref 0–40)
Albumin/Globulin Ratio: 1.6 (ref 1.2–2.2)
Albumin: 4.2 g/dL (ref 3.8–4.8)
Alkaline Phosphatase: 94 IU/L (ref 44–121)
BUN/Creatinine Ratio: 17 (ref 12–28)
BUN: 20 mg/dL (ref 8–27)
Bilirubin Total: 0.2 mg/dL (ref 0.0–1.2)
CO2: 22 mmol/L (ref 20–29)
Calcium: 8.2 mg/dL — ABNORMAL LOW (ref 8.7–10.3)
Chloride: 102 mmol/L (ref 96–106)
Creatinine, Ser: 1.15 mg/dL — ABNORMAL HIGH (ref 0.57–1.00)
Globulin, Total: 2.7 g/dL (ref 1.5–4.5)
Glucose: 86 mg/dL (ref 65–99)
Potassium: 3.9 mmol/L (ref 3.5–5.2)
Sodium: 139 mmol/L (ref 134–144)
Total Protein: 6.9 g/dL (ref 6.0–8.5)
eGFR: 52 mL/min/{1.73_m2} — ABNORMAL LOW (ref >59)

## 2021-03-16 LAB — CBC WITH DIFFERENTIAL/PLATELET
Basophils Absolute: 0.1 10*3/uL (ref 0.0–0.2)
Basos: 1 %
EOS (ABSOLUTE): 0.1 10*3/uL (ref 0.0–0.4)
Eos: 2 %
Hematocrit: 40.5 % (ref 34.0–46.6)
Hemoglobin: 13.3 g/dL (ref 11.1–15.9)
Immature Grans (Abs): 0.1 10*3/uL (ref 0.0–0.1)
Immature Granulocytes: 1 %
Lymphocytes Absolute: 2.6 10*3/uL (ref 0.7–3.1)
Lymphs: 35 %
MCH: 28.7 pg (ref 26.6–33.0)
MCHC: 32.8 g/dL (ref 31.5–35.7)
MCV: 88 fL (ref 79–97)
Monocytes Absolute: 0.8 10*3/uL (ref 0.1–0.9)
Monocytes: 10 %
Neutrophils Absolute: 4 10*3/uL (ref 1.4–7.0)
Neutrophils: 51 %
Platelets: 255 10*3/uL (ref 150–450)
RBC: 4.63 x10E6/uL (ref 3.77–5.28)
RDW: 14.5 % (ref 11.7–15.4)
WBC: 7.6 10*3/uL (ref 3.4–10.8)

## 2021-03-16 LAB — HEMOGLOBIN A1C
Est. average glucose Bld gHb Est-mCnc: 126 mg/dL
Hgb A1c MFr Bld: 6 % — ABNORMAL HIGH (ref 4.8–5.6)

## 2021-03-16 LAB — INSULIN, RANDOM: INSULIN: 14.4 u[IU]/mL (ref 2.6–24.9)

## 2021-03-16 LAB — TSH: TSH: 3.32 u[IU]/mL (ref 0.450–4.500)

## 2021-03-16 LAB — T3: T3, Total: 107 ng/dL (ref 71–180)

## 2021-03-16 LAB — FOLATE: Folate: 11.6 ng/mL (ref >3.0)

## 2021-03-16 LAB — VITAMIN B12: Vitamin B-12: 398 pg/mL (ref 232–1245)

## 2021-03-16 LAB — T4, FREE: Free T4: 0.93 ng/dL (ref 0.82–1.77)

## 2021-03-16 LAB — PREALBUMIN: PREALBUMIN: 21 mg/dL (ref 10–36)

## 2021-03-28 ENCOUNTER — Encounter (INDEPENDENT_AMBULATORY_CARE_PROVIDER_SITE_OTHER): Payer: Self-pay | Admitting: Family Medicine

## 2021-03-28 ENCOUNTER — Other Ambulatory Visit: Payer: Self-pay

## 2021-03-28 ENCOUNTER — Ambulatory Visit (INDEPENDENT_AMBULATORY_CARE_PROVIDER_SITE_OTHER): Payer: Medicare HMO | Admitting: Family Medicine

## 2021-03-28 VITALS — BP 103/67 | HR 67 | Temp 98.1°F | Ht 66.0 in | Wt 254.0 lb

## 2021-03-28 DIAGNOSIS — K219 Gastro-esophageal reflux disease without esophagitis: Secondary | ICD-10-CM | POA: Diagnosis not present

## 2021-03-28 DIAGNOSIS — R7303 Prediabetes: Secondary | ICD-10-CM | POA: Diagnosis not present

## 2021-03-28 DIAGNOSIS — Z720 Tobacco use: Secondary | ICD-10-CM | POA: Diagnosis not present

## 2021-03-28 DIAGNOSIS — E559 Vitamin D deficiency, unspecified: Secondary | ICD-10-CM | POA: Diagnosis not present

## 2021-03-28 DIAGNOSIS — Z9884 Bariatric surgery status: Secondary | ICD-10-CM

## 2021-03-28 DIAGNOSIS — Z6841 Body Mass Index (BMI) 40.0 and over, adult: Secondary | ICD-10-CM

## 2021-03-29 NOTE — Progress Notes (Signed)
Chief Complaint:   OBESITY Rachel Woodard is here to discuss her progress with her obesity treatment plan along with follow-up of her obesity related diagnoses. Rachel Woodard is on the Category 3 Plan and states she is following her eating plan approximately 99% of the time. Rachel Woodard states she is walking 2 miles 1 time a week.  Today's visit was #: 2 Starting weight: 258 lbs Starting date: 03/14/2021 Today's weight: 254 lbs Today's date: 03/28/2021 Total lbs lost to date: 4 Total lbs lost since last in-office visit: 4  Interim History: Rachel Woodard now has all food on meal plan at home. She went online and made her a calorie chart. She is feeling full from meals throughout the day. She has granola bar and peaches for snack calories. She has been able to get all food in. Sometimes she is experiencing hunger. Pt thinks continuing on plan will be easy.  Subjective:   1. Pre-diabetes Rachel Woodard is not on meds. Her last A1c was 6.0 and insulin level 14.4. She reports some carb cravings. Pt was previously on Metformin with significant GI side effects.  2. Vitamin D deficiency Pt is on OTC Vit D 1,000 IU daily. She reports fatigue.  3. Tobacco abuse Pt is smoking. She wants to devise a plan to quit after the New Year. She has started using Nicotine gum.  4. Gastroesophageal reflux disease, unspecified whether esophagitis present Rachel Woodard is on Protonix and notes no change in symptoms with start of meal plan.  5. Hx of bariatric surgery Pt's prealbumin and all electrolytes are within normal limits.  Assessment/Plan:   1. Pre-diabetes Rachel Woodard will continue to work on weight loss, exercise, and decreasing simple carbohydrates to help decrease the risk of diabetes. Continue category 3.  2. Vitamin D deficiency Low Vitamin D level contributes to fatigue and are associated with obesity, breast, and colon cancer. She agrees to start prescription Vitamin D 50,000 IU every week and will follow-up for routine  testing of Vitamin D, at least 2-3 times per year to avoid over-replacement.  3. Tobacco abuse Follow up January 2023.  4. Gastroesophageal reflux disease, unspecified whether esophagitis present Intensive lifestyle modifications are the first line treatment for this issue. We discussed several lifestyle modifications today and she will continue to work on diet, exercise and weight loss efforts. Orders and follow up as documented in patient record. Follow up on symptoms at next appt.  Counseling If a person has gastroesophageal reflux disease (GERD), food and stomach acid move back up into the esophagus and cause symptoms or problems such as damage to the esophagus. Anti-reflux measures include: raising the head of the bed, avoiding tight clothing or belts, avoiding eating late at night, not lying down shortly after mealtime, and achieving weight loss. Avoid ASA, NSAID's, caffeine, alcohol, and tobacco.  OTC Pepcid and/or Tums are often very helpful for as needed use.  However, for persisting chronic or daily symptoms, stronger medications like Omeprazole may be needed. You may need to avoid foods and drinks such as: Coffee and tea (with or without caffeine). Drinks that contain alcohol. Energy drinks and sports drinks. Bubbly (carbonated) drinks or sodas. Chocolate and cocoa. Peppermint and mint flavorings. Garlic and onions. Horseradish. Spicy and acidic foods. These include peppers, chili powder, curry powder, vinegar, hot sauces, and BBQ sauce. Citrus fruit juices and citrus fruits, such as oranges, lemons, and limes. Tomato-based foods. These include red sauce, chili, salsa, and pizza with red sauce. Fried and fatty foods. These include donuts,  french fries, potato chips, and high-fat dressings. High-fat meats. These include hot dogs, rib eye steak, sausage, ham, and bacon.  5. Hx of bariatric surgery Follow up labs in 3 months.  6. Obesity with current BMI of 41.1  Rachel Woodard is  currently in the action stage of change. As such, her goal is to continue with weight loss efforts. She has agreed to the Category 3 Plan.   Exercise goals: All adults should avoid inactivity. Some physical activity is better than none, and adults who participate in any amount of physical activity gain some health benefits.  Behavioral modification strategies: increasing lean protein intake, meal planning and cooking strategies, keeping healthy foods in the home, and planning for success.  Rachel Woodard has agreed to follow-up with our clinic in 2 weeks. She was informed of the importance of frequent follow-up visits to maximize her success with intensive lifestyle modifications for her multiple health conditions.   Objective:   Blood pressure 103/67, pulse 67, temperature 98.1 F (36.7 C), height 5\' 6"  (1.676 m), weight 254 lb (115.2 kg), SpO2 98 %. Body mass index is 41 kg/m.  General: Cooperative, alert, well developed, in no acute distress. HEENT: Conjunctivae and lids unremarkable. Cardiovascular: Regular rhythm.  Lungs: Normal work of breathing. Neurologic: No focal deficits.   Lab Results  Component Value Date   CREATININE 1.15 (H) 03/14/2021   BUN 20 03/14/2021   NA 139 03/14/2021   K 3.9 03/14/2021   CL 102 03/14/2021   CO2 22 03/14/2021   Lab Results  Component Value Date   ALT 15 03/14/2021   AST 17 03/14/2021   ALKPHOS 94 03/14/2021   BILITOT 0.2 03/14/2021   Lab Results  Component Value Date   HGBA1C 6.0 (H) 03/14/2021   HGBA1C 5.7 (H) 11/11/2018   HGBA1C 5.8 (H) 04/24/2018   HGBA1C 5.7 (H) 01/22/2018   HGBA1C 5.9 07/11/2017   Lab Results  Component Value Date   INSULIN 14.4 03/14/2021   Lab Results  Component Value Date   TSH 3.320 03/14/2021   Lab Results  Component Value Date   CHOL 170 03/14/2021   HDL 53 03/14/2021   LDLCALC 99 03/14/2021   TRIG 99 03/14/2021   CHOLHDL 3.1 07/11/2017   Lab Results  Component Value Date   VD25OH 33.0  03/14/2021   VD25OH 27.7 (L) 04/24/2018   VD25OH 23.0 (L) 01/22/2018   Lab Results  Component Value Date   WBC 7.6 03/14/2021   HGB 13.3 03/14/2021   HCT 40.5 03/14/2021   MCV 88 03/14/2021   PLT 255 03/14/2021    Attestation Statements:   Reviewed by clinician on day of visit: allergies, medications, problem list, medical history, surgical history, family history, social history, and previous encounter notes.  Time spent on visit including pre-visit chart review and post-visit care and charting was 45 minutes.   Coral Ceo, CMA, am acting as transcriptionist for Coralie Common, MD.   I have reviewed the above documentation for accuracy and completeness, and I agree with the above. - Coralie Common, MD

## 2021-04-19 ENCOUNTER — Ambulatory Visit (INDEPENDENT_AMBULATORY_CARE_PROVIDER_SITE_OTHER): Payer: Medicare HMO | Admitting: Family Medicine

## 2021-04-19 ENCOUNTER — Other Ambulatory Visit: Payer: Self-pay

## 2021-04-19 ENCOUNTER — Encounter (INDEPENDENT_AMBULATORY_CARE_PROVIDER_SITE_OTHER): Payer: Self-pay | Admitting: Family Medicine

## 2021-04-19 VITALS — BP 112/68 | HR 69 | Temp 98.3°F | Ht 66.0 in | Wt 250.0 lb

## 2021-04-19 DIAGNOSIS — R7303 Prediabetes: Secondary | ICD-10-CM | POA: Diagnosis not present

## 2021-04-19 DIAGNOSIS — Z6841 Body Mass Index (BMI) 40.0 and over, adult: Secondary | ICD-10-CM

## 2021-04-19 DIAGNOSIS — E559 Vitamin D deficiency, unspecified: Secondary | ICD-10-CM

## 2021-04-19 NOTE — Progress Notes (Signed)
Chief Complaint:   OBESITY Rachel Woodard is here to discuss her progress with her obesity treatment plan along with follow-up of her obesity related diagnoses. [name] is on the Category 3 Plan and states she is following her eating plan approximately 100% of the time. Shakita states she is walking 2 miles 1 times per week.  Today's visit was #: 3 Starting weight: 258 lbs Starting date: 03/14/2021 Today's weight: 250 lbs Today's date: 04/19/2021 Total lbs lost to date: 8 Total lbs lost since last in-office visit: 4  Interim History: Rachel Woodard is sometimes able to get all calories and food in. She notes occasional hunger in the evening when it's time to go to bed, so she may get toast, peanut butter, and milk. Pt has no upcoming plans or celebrations. She is getting in 6 oz meat at dinner and decreased sandwich meat sometimes to 2 oz. For snack, she is doing granola bars or Atkins bars.  Subjective:   1. Pre-diabetes [name]'s last A1c was 6.0 and insulin level 14.4. She is not on medication.  2. Vitamin D deficiency Pt is on OTC Vit D 1,000 IU daily. She reports fatigue.  Assessment/Plan:   1. Pre-diabetes Rachel Woodard will continue to work on weight loss, exercise, and decreasing simple carbohydrates to help decrease the risk of diabetes. Continue category 3 with no change in meal choices.  2. Vitamin D deficiency Low Vitamin D level contributes to fatigue and are associated with obesity, breast, and colon cancer. She agrees to continue to take OTC Vitamin D 1,000 IU daily and will follow-up for routine testing of Vitamin D, at least 2-3 times per year to avoid over-replacement.  3. Obesity with current BMI of 40.5  Breeze is currently in the action stage of change. As such, her goal is to continue with weight loss efforts. She has agreed to the Category 3 Plan.   Exercise goals:  As is  Behavioral modification strategies: increasing lean protein intake, meal planning and cooking  strategies, keeping healthy foods in the home, and planning for success.  Rachel Woodard has agreed to follow-up with our clinic in 2 weeks. She was informed of the importance of frequent follow-up visits to maximize her success with intensive lifestyle modifications for her multiple health conditions.   Objective:   Blood pressure 112/68, pulse 69, temperature 98.3 F (36.8 C), height 5\' 6"  (1.676 m), weight 250 lb (113.4 kg), SpO2 97 %. Body mass index is 40.35 kg/m.  General: Cooperative, alert, well developed, in no acute distress. HEENT: Conjunctivae and lids unremarkable. Cardiovascular: Regular rhythm.  Lungs: Normal work of breathing. Neurologic: No focal deficits.   Lab Results  Component Value Date   CREATININE 1.15 (H) 03/14/2021   BUN 20 03/14/2021   NA 139 03/14/2021   K 3.9 03/14/2021   CL 102 03/14/2021   CO2 22 03/14/2021   Lab Results  Component Value Date   ALT 15 03/14/2021   AST 17 03/14/2021   ALKPHOS 94 03/14/2021   BILITOT 0.2 03/14/2021   Lab Results  Component Value Date   HGBA1C 6.0 (H) 03/14/2021   HGBA1C 5.7 (H) 11/11/2018   HGBA1C 5.8 (H) 04/24/2018   HGBA1C 5.7 (H) 01/22/2018   HGBA1C 5.9 07/11/2017   Lab Results  Component Value Date   INSULIN 14.4 03/14/2021   Lab Results  Component Value Date   TSH 3.320 03/14/2021   Lab Results  Component Value Date   CHOL 170 03/14/2021   HDL 53 03/14/2021  [place] 99 03/14/2021   TRIG 99 03/14/2021   CHOLHDL 3.1 07/11/2017   Lab Results  Component Value Date   VD25OH 33.0 03/14/2021   VD25OH 27.7 (L) 04/24/2018   VD25OH 23.0 (L) 01/22/2018   Lab Results  Component Value Date   WBC 7.6 03/14/2021   HGB 13.3 03/14/2021   HCT 40.5 03/14/2021   MCV 88 03/14/2021   PLT 255 03/14/2021    Attestation Statements:   Reviewed by clinician on day of visit: allergies, medications, problem list, medical history, surgical history, family history, social history, and previous encounter  notes.  Time spent on visit including pre-visit chart review and post-visit care and charting was 12 minutes.   Coral Ceo, CMA, am acting as transcriptionist for Coralie Common, MD.   I have reviewed the above documentation for accuracy and completeness, and I agree with the above. - Coralie Common, MD

## 2021-04-20 ENCOUNTER — Encounter: Payer: Self-pay | Admitting: Orthopaedic Surgery

## 2021-04-20 ENCOUNTER — Ambulatory Visit (INDEPENDENT_AMBULATORY_CARE_PROVIDER_SITE_OTHER): Payer: Medicare HMO

## 2021-04-20 ENCOUNTER — Ambulatory Visit (INDEPENDENT_AMBULATORY_CARE_PROVIDER_SITE_OTHER): Payer: Medicare HMO | Admitting: Orthopaedic Surgery

## 2021-04-20 ENCOUNTER — Other Ambulatory Visit: Payer: Self-pay | Admitting: Orthopaedic Surgery

## 2021-04-20 DIAGNOSIS — M25562 Pain in left knee: Secondary | ICD-10-CM

## 2021-04-20 DIAGNOSIS — Z96652 Presence of left artificial knee joint: Secondary | ICD-10-CM

## 2021-04-20 DIAGNOSIS — G8929 Other chronic pain: Secondary | ICD-10-CM | POA: Diagnosis not present

## 2021-04-20 DIAGNOSIS — M25561 Pain in right knee: Secondary | ICD-10-CM | POA: Diagnosis not present

## 2021-04-20 DIAGNOSIS — M5412 Radiculopathy, cervical region: Secondary | ICD-10-CM

## 2021-04-20 MED ORDER — PREDNISONE 5 MG (21) PO TBPK: ORAL_TABLET | ORAL | 0 refills | Status: DC

## 2021-04-20 NOTE — Progress Notes (Signed)
Office Visit Note   Patient: Rachel Woodard           Date of Birth: May 03, 1951           MRN: [id number] Visit Date: 04/20/2021              Requested by: Cipriano Mile, NP [place],  [place] [address] PCP: Cipriano Mile, NP   Assessment & Plan: Visit Diagnoses:  1. H/O total knee replacement, left   2. Radiculopathy of cervical spine     Plan: Impression is over 2 years status post left total knee replacement with continued stiffness and cervical spine radiculopathy left upper extremity.  In regards to the knee, we have discussed weight loss as well as referral to outpatient physical therapy to work on strengthening and range of motion exercises.  Internal referral has been made.  In regards to her neck, we have discussed starting her on a steroid taper and adding this to her physical therapy regimen.  She will follow-up with [place] as needed.  Follow-Up Instructions: Return if symptoms worsen or fail to improve.   Orders:  Orders Placed This Encounter  Procedures   XR Cervical Spine 2 or 3 views   No orders of the defined types were placed in this encounter.     Procedures: No procedures performed   Clinical Data: No additional findings.   Subjective: Chief Complaint  Patient presents with   Right Knee - Pain   Left Knee - Pain    HPI patient is a pleasant 70 year old female who comes in today with left knee stiffness.  She is status post left total knee replacement 12/22/2018.  She has been doing well but continues to complain of stiffness which she is standing for a long time as well as first thing in the morning.  She does get some improvement with movement.  She has been taken occasional Mobic which she does not think helps her symptoms.  Other issue she brings up today is left arm tingling.  This began about 3 to 4 months ago after having COVID.  Her symptoms start in the back of her neck and radiate into the left parascapular region.  She has  tingling to the forearm and into the hand and all 5 fingers.  She thinks this is aggravated by sitting on the computer playing games.  Mobic minimally helps.  She has been taking B12 for over a month which she thinks has been helping.  No previous cervical spine pathology.  Review of Systems as detailed in HPI.  All others reviewed and are negative.   Objective: Vital Signs: There were no vitals taken for this visit.  Physical Exam well-developed well-nourished female in no acute distress.  Alert and oriented x3  Ortho Exam examination of the left knee reveals a fully healed surgical scar without complication.  Range of motion 0 to 110 degrees.  Stable to valgus varus stress.  She is neurovascular intact distally.  Cervical spine exam shows no spinous or paraspinous tenderness.  Mild left-sided parascapular tenderness.  Painless range of motion of the neck.  Negative Phalen and negative Tinel at the wrist.  No focal weakness.  She is neurovascular intact distally.  Specialty Comments:  No specialty comments available.  Imaging: XR Cervical Spine 2 or 3 views  Result Date: 04/20/2021 X-rays demonstrate multilevel degenerative changes worse C6-7  XR KNEE 3 VIEW LEFT  Result Date: 04/20/2021 X-rays reveal well-seated prosthesis without complication  XR KNEE  3 VIEW RIGHT  Result Date: 04/20/2021 Significant degenerative changes to the medial and patellofemoral compartments    PMFS History: Patient Active Problem List   Diagnosis Date Noted   Pain due to onychomycosis of toenails of both feet 01/20/2020   Shortness of breath 10/05/2019   Status post total left knee replacement 12/22/2018   MDD (major depressive disorder), recurrent episode, mild ([place]) 10/29/2018   Autoimmune hepatitis ([place]) 10/29/2018   OSA (obstructive sleep apnea) 10/29/2018   Renal cyst, right 10/22/2018   Lung nodule, solitary 10/22/2018   Genital herpes simplex 10/29/2017   Vitamin D deficiency 10/29/2017    Class 2 obesity due to excess calories without serious comorbidity with body mass index (BMI) of 38.0 to 38.9 in adult 10/29/2017   Essential hypertension 10/29/2017   Pre-diabetes 02/14/2017   Fibromyalgia 02/14/2017   Substance abuse in remission ([place]) 02/14/2017   Anxiety and depression 02/14/2017   HX: breast cancer 02/14/2017   Urge incontinence 11/13/2016   Unilateral primary osteoarthritis, left knee 06/14/2016   GERD (gastroesophageal reflux disease) 11/02/2015   Angioedema 10/24/2013   Tobacco abuse 10/24/2013   DCIS (ductal carcinoma in situ) of breast 04/07/2013   Depression 04/07/2013   Past Medical History:  Diagnosis Date   Anemia 2001   after gastric bypass   Anxiety    Aortic atherosclerosis (HCC)    Arthritis    Back pain    Bilateral swelling of feet    Bipolar 1 disorder (HCC)    Breast cancer ([place]) 2013   Breast cancer    Radiation therapy   Calcified granuloma of lung (HCC)    Cigarette nicotine dependence    Cigarette nicotine dependence in remission    Cocaine dependence in remission (HCC)    COPD (chronic obstructive pulmonary disease) (HCC)    Depression    Disequilibrium    Fibromyalgia    GERD (gastroesophageal reflux disease)    HSV infection    Hypertension    Hypertensive kidney disease with stage 3a chronic kidney disease ([place])    Hypertensive kidney disease with stage 3b chronic kidney disease ([place])    Osteoarthritis    Personal history of infectious disease    Personal history of radiation therapy 2013   Pneumonia    Pre-diabetes    PTSD (post-traumatic stress disorder)    Seasonal allergic rhinitis due to pollen    Sleep apnea    uses CPAP   SOB (shortness of breath)    Vitamin D deficiency     Family History  Problem Relation Age of Onset   Leukemia Mother    Prostate cancer Father        stage IV   Liver disease Brother    Diabetes Other    Cancer Other    Migraines Neg Hx    Headache Neg Hx     Past Surgical  History:  Procedure Laterality Date   ABDOMINAL HYSTERECTOMY  2018   BREAST LUMPECTOMY Right 2013   GASTRIC BYPASS  2001   TOTAL KNEE ARTHROPLASTY Left 12/22/2018   Procedure: LEFT TOTAL KNEE ARTHROPLASTY;  Surgeon: Leandrew Koyanagi, MD;  Location: [facility];  Service: Orthopedics;  Laterality: Left;   Social History   Occupational History   Occupation: Retired  Tobacco Use   Smoking status: Every Day    Packs/day: 0.50    Years: 20.00    Pack years: 10.00    Types: Cigarettes    Start date: 06/26/1999   Smokeless tobacco: Never  Tobacco comments:    trying to quit  Vaping Use   Vaping Use: Never used  Substance and Sexual Activity   Alcohol use: Not Currently    Comment: seldom   Drug use: No    Types: Cocaine    Comment: clean x 60 days on 11/28/15   Sexual activity: Yes

## 2021-04-24 MED ORDER — VITAMIN D (ERGOCALCIFEROL) 1.25 MG (50000 UNIT) PO CAPS: 50000.0000 [IU] | ORAL_CAPSULE | ORAL | 0 refills | Status: DC

## 2021-05-01 ENCOUNTER — Other Ambulatory Visit: Payer: Self-pay | Admitting: Podiatry

## 2021-05-01 NOTE — Telephone Encounter (Signed)
Please schedule for f/u appointment for medication refill

## 2021-05-03 ENCOUNTER — Ambulatory Visit (INDEPENDENT_AMBULATORY_CARE_PROVIDER_SITE_OTHER): Payer: Medicare HMO | Admitting: Family Medicine

## 2021-05-03 ENCOUNTER — Other Ambulatory Visit: Payer: Self-pay

## 2021-05-03 ENCOUNTER — Encounter (INDEPENDENT_AMBULATORY_CARE_PROVIDER_SITE_OTHER): Payer: Self-pay | Admitting: Family Medicine

## 2021-05-03 VITALS — BP 122/74 | HR 62 | Temp 98.0°F | Ht 66.0 in | Wt 252.0 lb

## 2021-05-03 DIAGNOSIS — Z6841 Body Mass Index (BMI) 40.0 and over, adult: Secondary | ICD-10-CM

## 2021-05-03 DIAGNOSIS — E559 Vitamin D deficiency, unspecified: Secondary | ICD-10-CM

## 2021-05-03 DIAGNOSIS — R7303 Prediabetes: Secondary | ICD-10-CM

## 2021-05-03 NOTE — Progress Notes (Signed)
Chief Complaint:   OBESITY Rachel Woodard is here to discuss her progress with her obesity treatment plan along with follow-up of her obesity related diagnoses. Rachel Woodard is on the Category 3 Plan and states she is following her eating plan approximately 99% of the time. Rachel Woodard states she is walking 1.5-2.5 miles 1-2 times per week.  Today's visit was #: 4 Starting weight: 258 lbs Starting date: 03/14/2021 Today's weight: 252 lbs Today's date: 05/03/2021 Total lbs lost to date: 6 Total lbs lost since last in-office visit: 0  Interim History: Rachel Woodard is doing a bit more cooking than she was previously. Pt did make some stewed chicken and garlic bread last night. She had a [place] sandwich and Atkins chips and an ice cream bar for lunch. Pt is consistently getting in 3 meals. She has no upcoming plans for Thanksgiving or her birthday.  Subjective:   1. Pre-diabetes Rachel Woodard's last A1c was 6.0 and insulin level 14.4. She is not on medication.  2. Vitamin D deficiency Pt is on prescription Vit D and her last Vit D level was 33.0.  Assessment/Plan:   1. Pre-diabetes Rachel Woodard will continue to work on weight loss, exercise, and decreasing simple carbohydrates to help decrease the risk of diabetes. Repeat labs in January 2023.  2. Vitamin D deficiency Low Vitamin D level contributes to fatigue and are associated with obesity, breast, and colon cancer. She agrees to continue to take prescription Vitamin D 50,000 IU every week and will follow-up for routine testing of Vitamin D, at least 2-3 times per year to avoid over-replacement.  3. Obesity with current BMI of 40.7  Rachel Woodard is currently in the action stage of change. As such, her goal is to continue with weight loss efforts. She has agreed to the Category 3 Plan.   Exercise goals:  As is  Behavioral modification strategies: increasing lean protein intake, meal planning and cooking strategies, and keeping healthy foods in the  home.  Rachel Woodard has agreed to follow-up with our clinic in 3 weeks. She was informed of the importance of frequent follow-up visits to maximize her success with intensive lifestyle modifications for her multiple health conditions.   Objective:   Blood pressure 122/74, pulse 62, temperature 98 F (36.7 C), height 5\' 6"  (1.676 m), weight 252 lb (114.3 kg), SpO2 99 %. Body mass index is 40.67 kg/m.  General: Cooperative, alert, well developed, in no acute distress. HEENT: Conjunctivae and lids unremarkable. Cardiovascular: Regular rhythm.  Lungs: Normal work of breathing. Neurologic: No focal deficits.   Lab Results  Component Value Date   CREATININE 1.15 (H) 03/14/2021   BUN 20 03/14/2021   NA 139 03/14/2021   K 3.9 03/14/2021   CL 102 03/14/2021   CO2 22 03/14/2021   Lab Results  Component Value Date   ALT 15 03/14/2021   AST 17 03/14/2021   ALKPHOS 94 03/14/2021   BILITOT 0.2 03/14/2021   Lab Results  Component Value Date   HGBA1C 6.0 (H) 03/14/2021   HGBA1C 5.7 (H) 11/11/2018   HGBA1C 5.8 (H) 04/24/2018   HGBA1C 5.7 (H) 01/22/2018   HGBA1C 5.9 07/11/2017   Lab Results  Component Value Date   INSULIN 14.4 03/14/2021   Lab Results  Component Value Date   TSH 3.320 03/14/2021   Lab Results  Component Value Date   CHOL 170 03/14/2021   HDL 53 03/14/2021   LDLCALC 99 03/14/2021   TRIG 99 03/14/2021   CHOLHDL 3.1 07/11/2017   Lab  Results  Component Value Date   VD25OH 33.0 03/14/2021   VD25OH 27.7 (L) 04/24/2018   VD25OH 23.0 (L) 01/22/2018   Lab Results  Component Value Date   WBC 7.6 03/14/2021   HGB 13.3 03/14/2021   HCT 40.5 03/14/2021   MCV 88 03/14/2021   PLT 255 03/14/2021    Attestation Statements:   Reviewed by clinician on day of visit: allergies, medications, problem list, medical history, surgical history, family history, social history, and previous encounter notes.  Time spent on visit including pre-visit chart review and post-visit  care and charting was 13 minutes.   Coral Ceo, CMA, am acting as transcriptionist for Coralie Common, MD.   I have reviewed the above documentation for accuracy and completeness, and I agree with the above. - Coralie Common, MD

## 2021-05-12 ENCOUNTER — Ambulatory Visit: Payer: Medicare HMO

## 2021-05-15 ENCOUNTER — Other Ambulatory Visit: Payer: Self-pay | Admitting: Student

## 2021-05-15 ENCOUNTER — Other Ambulatory Visit: Payer: Self-pay

## 2021-05-15 ENCOUNTER — Ambulatory Visit
Admission: RE | Admit: 2021-05-15 | Discharge: 2021-05-15 | Disposition: A | Discharge status: Home / Self Care | Payer: Medicare HMO | Source: Ambulatory Visit | Attending: Student | Admitting: Student

## 2021-05-15 DIAGNOSIS — R059 Cough, unspecified: Secondary | ICD-10-CM

## 2021-05-17 ENCOUNTER — Ambulatory Visit: Payer: Medicare HMO | Attending: Physician Assistant | Admitting: Physical Therapy

## 2021-05-17 ENCOUNTER — Other Ambulatory Visit: Payer: Self-pay

## 2021-05-17 DIAGNOSIS — M5412 Radiculopathy, cervical region: Secondary | ICD-10-CM | POA: Diagnosis not present

## 2021-05-17 DIAGNOSIS — R262 Difficulty in walking, not elsewhere classified: Secondary | ICD-10-CM | POA: Diagnosis present

## 2021-05-17 DIAGNOSIS — M542 Cervicalgia: Secondary | ICD-10-CM | POA: Diagnosis present

## 2021-05-17 DIAGNOSIS — M25662 Stiffness of left knee, not elsewhere classified: Secondary | ICD-10-CM | POA: Diagnosis present

## 2021-05-17 DIAGNOSIS — Z96652 Presence of left artificial knee joint: Secondary | ICD-10-CM | POA: Diagnosis not present

## 2021-05-17 NOTE — Therapy (Signed)
[place], [place], [address] Phone: [phone]   Fax:  [phone]  Physical Therapy Evaluation  Patient Details  Name: Rachel Woodard MRN: [id number] Date of Birth: 1951/06/08 Referring Provider (PT): Dwana Melena, [place] , Dr Erlinda Hong /   Encounter Date: 05/17/2021   PT End of Session - 05/17/21 0750     Visit Number 1    Number of Visits 12    Date for PT Re-Evaluation 06/28/21    Authorization Type [employer]    PT Start Time 0800    PT Stop Time 0845    PT Time Calculation (min) 45 min    Activity Tolerance Patient tolerated treatment well    Behavior During Therapy [facility] for tasks assessed/performed             Past Medical History:  Diagnosis Date   Anemia 2001   after gastric bypass   Anxiety    Aortic atherosclerosis (HCC)    Arthritis    Back pain    Bilateral swelling of feet    Bipolar 1 disorder ([place])    Breast cancer ([place]) 2013   Breast cancer    Radiation therapy   Calcified granuloma of lung ([place])    Cigarette nicotine dependence    Cigarette nicotine dependence in remission    Cocaine dependence in remission ([place])    COPD (chronic obstructive pulmonary disease) (HCC)    Depression    Disequilibrium    Fibromyalgia    GERD (gastroesophageal reflux disease)    HSV infection    Hypertension    Hypertensive kidney disease with stage 3a chronic kidney disease ([place])    Hypertensive kidney disease with stage 3b chronic kidney disease ([place])    Osteoarthritis    Personal history of infectious disease    Personal history of radiation therapy 2013   Pneumonia    Pre-diabetes    PTSD (post-traumatic stress disorder)    Seasonal allergic rhinitis due to pollen    Sleep apnea    uses CPAP   SOB (shortness of breath)    Vitamin D deficiency     Past Surgical History:  Procedure Laterality Date   ABDOMINAL HYSTERECTOMY  2018   BREAST LUMPECTOMY Right 2013   GASTRIC BYPASS   2001   TOTAL KNEE ARTHROPLASTY Left 12/22/2018   Procedure: LEFT TOTAL KNEE ARTHROPLASTY;  Surgeon: Leandrew Koyanagi, MD;  Location: [facility];  Service: Orthopedics;  Laterality: Left;    There were no vitals filed for this visit.    Subjective Assessment - 05/17/21 0803     Subjective Patient complains of tightness in anterior knee which began to increase about 3 mos ago. This has impacted her ability to walk, limited to about 2 miles, about 2 times per week. She used to walk 3 miles almost every day. She denies weakness in that leg .  She notices her gait is "off". She also presents with L sided neck and arm /hand symptoms (weak, sensory) This onset was gradual and only L sided. It has improved a great deal with B12.    Pertinent History L TKR 2020., history of Breast CA, kidney disease, see snapshot    Limitations Walking;Standing    Diagnostic tests XR done neck and knee, both normal other than degenerative changes in cervical spine    Patient Stated Goals Patient would like to be able to return normal walking and ROM.    Currently in Pain? No/denies  Pain Score 3     Pain Location Neck    Pain Orientation Left;Lateral    Pain Descriptors / Indicators Tingling;Discomfort    Pain Type Acute pain    Pain Radiating Towards hand    Pain Onset More than a month ago    Pain Frequency Several days a week    Aggravating Factors  not sure    Pain Relieving Factors B12 helped.    Multiple Pain Sites Yes    Pain Score 3   minimal   Pain Location Knee    Pain Orientation Left;Anterior    Pain Descriptors / Indicators Tightness    Pain Type Chronic pain    Pain Onset More than a month ago    Pain Frequency Intermittent    Aggravating Factors  walking    Pain Relieving Factors resting , laying down    Effect of Pain on Daily Activities limits her fitness, walking                [facility] PT Assessment - 05/17/21 0001       Assessment   Medical Diagnosis h/o total knee arthroplasty,  left.  Neck pain .    Referring Provider (PT) Dwana Melena, PA , Dr Erlinda Hong /    Onset Date/Surgical Date --   acute on chronic   Prior Therapy For knee in 2020.      Precautions   Precautions None      Restrictions   Weight Bearing Restrictions No      Balance Screen   Has the patient fallen in the past 6 months No      [place] residence    Living Arrangements Alone    Type of [place] to enter    Entrance Stairs-Number of Steps 12    Entrance Stairs-Rails Left;Right      Prior Function   Level of Independence Independent    Vocation Retired    [employer] system    Leisure walking, music, reading, family      Cognition   Overall Cognitive Status Within Functional Limits for tasks assessed      Observation/Other Assessments   Focus on Therapeutic Outcomes (FOTO)  KNee 60%, back 63% (not set up for neck)      Sensation   Light Touch Appears Intact      Posture/Postural Control   Posture Comments Genu varus, knees flexed , poor sitting posture, rounded upper back and FW head      AROM   Right Knee Extension 14    Right Knee Flexion 100    Left Knee Extension 10    Left Knee Flexion 100    Cervical Flexion 60    Cervical Extension 35    Cervical - Right Side Bend 30    Cervical - Left Side Bend 26    Cervical - Right Rotation WNL    Cervical - Left Rotation 25%      Strength   Overall Strength Comments WFL in knees and UEs    Right Hand Grip (lbs) 55    Left Hand Grip (lbs) 58      Palpation   Patella mobility hypomobile bilaterally    Palpation comment none tender in L knee, limited palpation in cervical spine was not hurting anf time constraints      Special Tests   Other special tests Neg [place]      Transfers  Five time sit to stand comments  11 sec                        Objective measurements completed on examination: See above findings.                 PT Education - 05/17/21 0839     Education Details PT/POC, HEP    Person(s) Educated Patient    Methods Explanation;Handout    Comprehension Returned demonstration;Verbalized understanding;Tactile cues required                 PT Long Term Goals - 05/17/21 1128       PT LONG TERM GOAL #1   Title Pt wil increase L knee ROM >/= 5- 110 degrees for functional ROM required for efficient gait and ADLs    Baseline 0-10-100    Time 6    Period Weeks    Status New    Target Date 06/28/21      PT LONG TERM GOAL #2   Title pt to be able to stand/ walk >/= 45 min and navigate 12 steps reciprocally with LRAD for functional mobility required for community ambulation    Time 6    Period Weeks    Status New    Target Date 06/28/21      PT LONG TERM GOAL #3   Title increase FOTO score to > 65% able for neck and knee to demo improvement in function    Time 6    Period Weeks    Status New    Target Date 06/28/21      PT LONG TERM GOAL #4   Title Pt will no longer experience UE symptoms with UE activity, sitting at computer or lifting items in her home    Time 6    Period Weeks    Status New    Target Date 06/28/21      PT LONG TERM GOAL #5   Title pt to be I with all HEP given as of last visit to maintain and progress current level of funciton.    Time 6    Period Weeks    Status New    Target Date 06/28/21                    Plan - 05/17/21 0841     Clinical Impression Statement Pt presents for low complexity eval of chronic L knee stiffness, in addition to new onset of neck and L arm pain .  The knee pain began to increase about 3 mos ago and she has had to reduce her amount of walking.  LEs are fairly strong but has patellar stiffness, bilateral.  She has not tried any particular stretches for it.  She also has L sided neck and arm pain wihcb has improved.  She has nornal UE strength and grip, mild limitations in  cervical ROM .  She  was given basic HEP for posture and knee mobility Encouraged to cont walking and consider aquatics to increase mobility and aid in weight loss efforts.    Personal Factors and Comorbidities Behavior Pattern;Comorbidity 3+;Time since onset of injury/illness/exacerbation    Comorbidities obesity, previous TKA, HTN, CA    Examination-Activity Limitations Bend;Lift;Carry;Locomotion Level;Squat;Stand    Examination-Participation Restrictions Community Activity;Shop;Interpersonal Relationship;Cleaning    Stability/Clinical Decision Making Stable/Uncomplicated    Clinical Decision Making Low    Rehab Potential Excellent    PT Frequency 2x /  week    PT Duration 6 weeks    PT Treatment/Interventions ADLs/Self Care Home Management;Electrical Stimulation;Aquatic Therapy;Cryotherapy;Moist Heat;Gait training;Functional mobility training;Therapeutic activities;Therapeutic exercise;Balance training;Patient/family education;Manual techniques;Dry needling;Passive range of motion;Taping    PT Next Visit Plan check HEP, Nustep, posture, consider manual to neck    PT Home Exercise Plan [id number]    Consulted and Agree with Plan of Care Patient             Patient will benefit from skilled therapeutic intervention in order to improve the following deficits and impairments:  Decreased range of motion, Decreased strength, Pain, Obesity, Postural dysfunction, Impaired flexibility, Difficulty walking, Decreased mobility  Visit Diagnosis: Cervicalgia  Stiffness of left knee, not elsewhere classified  Difficulty in walking, not elsewhere classified     Problem List Patient Active Problem List   Diagnosis Date Noted   Pain due to onychomycosis of toenails of both feet 01/20/2020   Shortness of breath 10/05/2019   Status post total left knee replacement 12/22/2018   MDD (major depressive disorder), recurrent episode, mild ([place]) 10/29/2018   Autoimmune hepatitis ([place]) 10/29/2018   OSA (obstructive sleep  apnea) 10/29/2018   Renal cyst, right 10/22/2018   Lung nodule, solitary 10/22/2018   Genital herpes simplex 10/29/2017   Vitamin D deficiency 10/29/2017   Class 2 obesity due to excess calories without serious comorbidity with body mass index (BMI) of 38.0 to 38.9 in adult 10/29/2017   Essential hypertension 10/29/2017   Pre-diabetes 02/14/2017   Fibromyalgia 02/14/2017   Substance abuse in remission ([place]) 02/14/2017   Anxiety and depression 02/14/2017   HX: breast cancer 02/14/2017   Urge incontinence 11/13/2016   Unilateral primary osteoarthritis, left knee 06/14/2016   GERD (gastroesophageal reflux disease) 11/02/2015   Angioedema 10/24/2013   Tobacco abuse 10/24/2013   DCIS (ductal carcinoma in situ) of breast 04/07/2013   Depression 04/07/2013    Tishawna Larouche, PT 05/17/2021, 11:35 AM  [facility] [address] [place], [place], [address] Phone: [phone]   Fax:  [phone]  Name: Rachel Woodard MRN: [id number] Date of Birth: [birth year]   Raeford Razor, PT 05/17/21 11:37 AM Phone: [phone] Fax: [phone]   Referring diagnosis? H/o total knee replacement, cervical radiculopathy Treatment diagnosis? (if different than referring diagnosis) cervicalgia, stiffness of knee, difficulty walking What was this (referring dx) caused by? []  Surgery []  Fall [x]  Ongoing issue [x]  Arthritis []  Other: ____________  Laterality: []  Rt [x]  Lt []  Both  Check all possible CPT codes:      []  97110 (Therapeutic Exercise)  []  92507 (SLP Treatment)  []  97112 (Neuro Re-ed)   []  92526 (Swallowing Treatment)   []  97116 (Gait Training)   []  [id number] (Cognitive Training, 1st 15 minutes) []  97140 (Manual Therapy)   []  97130 (Cognitive Training, each add'l 15 minutes)  []  44034 (Therapeutic Activities)  []  Other, List CPT Code ____________    []  74259 (Self Care)       [x]  All codes above (97110 - 97535)  []  97012  (Mechanical Traction)  []  97014 (E-stim Unattended)  []  97032 (E-stim manual)  []  97033 (Ionto)  []  97035 (Ultrasound)  []  97760 (Orthotic Fit) [x]  [id number] (Physical Performance Training) [x]  [id number] (Aquatic Therapy) []  97034 (Contrast Bath) []  [id number] (Paraffin) []  97597 (Wound Care 1st 20 sq cm) []  97598 (Wound Care each add'l 20 sq cm) []  97016 (Vasopneumatic Device) []  [id number] (Orthotic Training) []  [id number] (Prosthetic Training)   Raeford Razor, PT 05/17/21 11:37 AM Phone: [phone] Fax: [phone]

## 2021-05-17 NOTE — Patient Instructions (Addendum)
Access Code: [email or web address] URL: [email or web address] Date: 05/17/2021 Prepared by: Raeford Razor  Exercises Standing Bilateral Low Shoulder Row with Anchored Resistance - 2 x daily - 7 x weekly - 2 sets - 10 reps - 5 hold Shoulder extension with resistance - Neutral - 2 x daily - 7 x weekly - 2 sets - 10 reps - 5 hold Upper Trapezius Stretch - 2 x daily - 7 x weekly - 1 sets - 5 reps - 30 hold Seated Hamstring Stretch - 2 x daily - 7 x weekly - 1 sets - 5 reps - 30 hold Modified Prone Quadriceps Stretch with Strap - 2 x daily - 7 x weekly - 1 sets - 5 reps - 30 hold   Aquatic Therapy at [facility]-  What to Expect!  Where:   [facility] [place], [place] [address] Rehab phone [phone]  NOTE:  You will receive an automated phone message reminding you of your appt and it will say the appointment is at the [facility] clinic.          How to Prepare: Please make sure you drink 8 ounces of water about one hour prior to your pool session A caregiver may attend if needed with the patient to help assist as needed. A caregiver can sit in the pool room on chair. Please arrive IN YOUR SUIT and 15 minutes prior to your appointment - this helps to avoid delays in starting your session. Please make sure to attend to any toileting needs prior to entering the pool [place] rooms for changing are provided.   There is direct access to the pool deck form the locker room.  You can lock your belongings in a locker with lock provided. Once on the pool deck your therapist will ask if you have signed the Patient  Consent and Assignment of Benefits form before beginning treatment Your therapist may take your blood pressure prior to, during and after your session if indicated We usually try and create a home exercise program based on activities we do in the pool.  Please be thinking about who might be able to assist  you in the pool should you need to participate in an aquatic home exercise program at the time of discharge if you need assistance.  Some patients do not want to or do not have the ability to participate in an aquatic home program - this is not a barrier in any way to you participating in aquatic therapy as part of your current therapy plan! After Discharge from PT, you can continue using home program at  the [facility], there is a drop-in fee for $5 ($45 a month)or for 60 years  or older $4.00 ($40 a month for seniors ) or any local [employer] pool.  Memberships for purchase are available for gym/pool at [place] LAST AQUATIC VISIT.  PLEASE MAKE SURE THAT YOU HAVE A [address]  APPOINTMENT SCHEDULED.   About the pool: Pool is located approximately 500 FT from the entrance of the building.  Please bring a support person if you need assistance traveling this      distance.   Your therapist will assist you in entering the water; there are two ways to           enter: stairs with railings, and a mechanical lift. Your therapist will  determine the most appropriate way for you.  Water temperature is usually between 88-90 degrees  There may be up to 2 other swimmers in the pool at the same time  The pool deck is tile, please wear shoes with good traction if you prefer not to be barefoot.    Contact Info:  For appointment scheduling and cancellations:         Please call the [place] [facility]       All sessions are 45 minutes

## 2021-05-23 ENCOUNTER — Ambulatory Visit: Payer: Medicare HMO

## 2021-05-23 ENCOUNTER — Other Ambulatory Visit: Payer: Self-pay

## 2021-05-23 DIAGNOSIS — M542 Cervicalgia: Secondary | ICD-10-CM | POA: Diagnosis not present

## 2021-05-23 DIAGNOSIS — M25662 Stiffness of left knee, not elsewhere classified: Secondary | ICD-10-CM

## 2021-05-23 DIAGNOSIS — R262 Difficulty in walking, not elsewhere classified: Secondary | ICD-10-CM

## 2021-05-23 NOTE — Therapy (Signed)
[facility], [place], [address] Phone: [phone]   Fax:  [phone]  Physical Therapy Treatment  Patient Details  Name: Rachel Woodard MRN: [id number] Date of Birth: 1950-11-28 Referring Provider (PT): Dwana Melena, [place] , Dr Erlinda Hong /   Encounter Date: 05/23/2021   PT End of Session - 05/23/21 1453     Visit Number 2    Number of Visits 12    Date for PT Re-Evaluation 06/28/21    Authorization Type [employer]    PT Start Time 1450    PT Stop Time 1528    PT Time Calculation (min) 38 min    Activity Tolerance Patient tolerated treatment well    Behavior During Therapy [facility] for tasks assessed/performed             Past Medical History:  Diagnosis Date   Anemia 2001   after gastric bypass   Anxiety    Aortic atherosclerosis (HCC)    Arthritis    Back pain    Bilateral swelling of feet    Bipolar 1 disorder ([place])    Breast cancer ([place]) 2013   Breast cancer    Radiation therapy   Calcified granuloma of lung ([place])    Cigarette nicotine dependence    Cigarette nicotine dependence in remission    Cocaine dependence in remission ([place])    COPD (chronic obstructive pulmonary disease) ([place])    Depression    Disequilibrium    Fibromyalgia    GERD (gastroesophageal reflux disease)    HSV infection    Hypertension    Hypertensive kidney disease with stage 3a chronic kidney disease ([place])    Hypertensive kidney disease with stage 3b chronic kidney disease ([place])    Osteoarthritis    Personal history of infectious disease    Personal history of radiation therapy 2013   Pneumonia    Pre-diabetes    PTSD (post-traumatic stress disorder)    Seasonal allergic rhinitis due to pollen    Sleep apnea    uses CPAP   SOB (shortness of breath)    Vitamin D deficiency     Past Surgical History:  Procedure Laterality Date   ABDOMINAL HYSTERECTOMY  2018   BREAST LUMPECTOMY Right 2013   GASTRIC BYPASS   2001   TOTAL KNEE ARTHROPLASTY Left 12/22/2018   Procedure: LEFT TOTAL KNEE ARTHROPLASTY;  Surgeon: Leandrew Koyanagi, MD;  Location: [facility];  Service: Orthopedics;  Laterality: Left;    There were no vitals filed for this visit.   Subjective Assessment - 05/23/21 1453     Subjective Pt presents to PT with reports of decreased pain today. She has been compliant with her HEP with no adverse effect. She is ready to begin PT treatment at this time.    Currently in Pain? No/denies    Pain Score 0-No pain           [facility] Adult PT Treatment/Exercise:   Therapeutic Exercise:  NuStep lvl 5 UE/LE x 3 min while taking subjective STS no UE support 2x10  Supine heel slide L w/ strap x 10 - 5" Supine SLR 2x10 ea Bridge 2x10 - 3" Standing hip abd/ext 2x10 17.5lb Row 3x10 13lb Leg pres 3x8 75lb  Manual Therapy: N/A   Neuromuscular re-ed: N/A   Therapeutic Activity: N/A   Modalities: N/A   Self Care: N/A   Consider / progression for next session:  PT Long Term Goals - 05/17/21 1128       PT LONG TERM GOAL #1   Title Pt wil increase L knee ROM >/= 5- 110 degrees for functional ROM required for efficient gait and ADLs    Baseline 0-10-100    Time 6    Period Weeks    Status New    Target Date 06/28/21      PT LONG TERM GOAL #2   Title pt to be able to stand/ walk >/= 45 min and navigate 12 steps reciprocally with LRAD for functional mobility required for community ambulation    Time 6    Period Weeks    Status New    Target Date 06/28/21      PT LONG TERM GOAL #3   Title increase FOTO score to > 65% able for neck and knee to demo improvement in function    Time 6    Period Weeks    Status New    Target Date 06/28/21      PT LONG TERM GOAL #4   Title Pt will no longer experience UE symptoms with UE activity, sitting at computer or lifting items in her home    Time 6    Period Weeks    Status New     Target Date 06/28/21      PT LONG TERM GOAL #5   Title pt to be I with all HEP given as of last visit to maintain and progress current level of funciton.    Time 6    Period Weeks    Status New    Target Date 06/28/21                   Plan - 05/23/21 1456     Clinical Impression Statement Pt was able to complete all prescribed exercises with no adverse effect or change in baseline. Therapy today focused on increasing proximal hip and quad strength in order to decrease L knee pain and improve mobility. Also focused on increasing periscapular strength, although she generally notes improvement in neck and L UE symptoms since evaluation. She continues to benefit from skilled PT servivces and will continue to be seen and progressed as able.    PT Treatment/Interventions ADLs/Self Care Home Management;Electrical Stimulation;Aquatic Therapy;Cryotherapy;Moist Heat;Gait training;Functional mobility training;Therapeutic activities;Therapeutic exercise;Balance training;Patient/family education;Manual techniques;Dry needling;Passive range of motion;Taping    PT Next Visit Plan progress LE and periscapular strength as able    PT Home Exercise Plan [id number]             Patient will benefit from skilled therapeutic intervention in order to improve the following deficits and impairments:  Decreased range of motion, Decreased strength, Pain, Obesity, Postural dysfunction, Impaired flexibility, Difficulty walking, Decreased mobility  Visit Diagnosis: Stiffness of left knee, not elsewhere classified  Difficulty in walking, not elsewhere classified  Cervicalgia     Problem List Patient Active Problem List   Diagnosis Date Noted   Pain due to onychomycosis of toenails of both feet 01/20/2020   Shortness of breath 10/05/2019   Status post total left knee replacement 12/22/2018   MDD (major depressive disorder), recurrent episode, mild ([place]) 10/29/2018   Autoimmune hepatitis ([place])  10/29/2018   OSA (obstructive sleep apnea) 10/29/2018   Renal cyst, right 10/22/2018   Lung nodule, solitary 10/22/2018   Genital herpes simplex 10/29/2017   Vitamin D deficiency 10/29/2017   Class 2 obesity due to excess calories without serious comorbidity with body mass  index (BMI) of 38.0 to 38.9 in adult 10/29/2017   Essential hypertension 10/29/2017   Pre-diabetes 02/14/2017   Fibromyalgia 02/14/2017   Substance abuse in remission ([place]) 02/14/2017   Anxiety and depression 02/14/2017   HX: breast cancer 02/14/2017   Urge incontinence 11/13/2016   Unilateral primary osteoarthritis, left knee 06/14/2016   GERD (gastroesophageal reflux disease) 11/02/2015   Angioedema 10/24/2013   Tobacco abuse 10/24/2013   DCIS (ductal carcinoma in situ) of breast 04/07/2013   Depression 04/07/2013    Ward Chatters, PT 05/23/2021, 3:29 PM  [facility] [address] [place], [place], [address] Phone: [phone]   Fax:  [phone]  Name: Rachel Woodard MRN: [id number] Date of Birth: June 02, 1951

## 2021-05-26 ENCOUNTER — Other Ambulatory Visit: Payer: Self-pay

## 2021-05-26 ENCOUNTER — Ambulatory Visit: Payer: Medicare HMO | Attending: Physician Assistant | Admitting: Physical Therapy

## 2021-05-26 DIAGNOSIS — R262 Difficulty in walking, not elsewhere classified: Secondary | ICD-10-CM | POA: Diagnosis present

## 2021-05-26 DIAGNOSIS — M25662 Stiffness of left knee, not elsewhere classified: Secondary | ICD-10-CM | POA: Diagnosis present

## 2021-05-26 DIAGNOSIS — M542 Cervicalgia: Secondary | ICD-10-CM | POA: Insufficient documentation

## 2021-05-26 NOTE — Therapy (Signed)
[facility], [place], [address] Phone: [phone]   Fax:  [phone]  Physical Therapy Treatment  Patient Details  Name: Rachel Woodard MRN: [id number] Date of Birth: Oct 26, 1950 Referring Provider (PT): Dwana Melena, [place] , Dr Erlinda Hong /   Encounter Date: 05/26/2021   PT End of Session - 05/26/21 1144     Visit Number 3    Number of Visits 12    Date for PT Re-Evaluation 06/28/21    Authorization Type [employer]    PT Start Time 1140    PT Stop Time 1225    PT Time Calculation (min) 45 min    Activity Tolerance Patient tolerated treatment well    Behavior During Therapy [facility] for tasks assessed/performed             Past Medical History:  Diagnosis Date   Anemia 2001   after gastric bypass   Anxiety    Aortic atherosclerosis (HCC)    Arthritis    Back pain    Bilateral swelling of feet    Bipolar 1 disorder ([place])    Breast cancer ([place]) 2013   Breast cancer    Radiation therapy   Calcified granuloma of lung ([place])    Cigarette nicotine dependence    Cigarette nicotine dependence in remission    Cocaine dependence in remission ([place])    COPD (chronic obstructive pulmonary disease) ([place])    Depression    Disequilibrium    Fibromyalgia    GERD (gastroesophageal reflux disease)    HSV infection    Hypertension    Hypertensive kidney disease with stage 3a chronic kidney disease ([place])    Hypertensive kidney disease with stage 3b chronic kidney disease ([place])    Osteoarthritis    Personal history of infectious disease    Personal history of radiation therapy 2013   Pneumonia    Pre-diabetes    PTSD (post-traumatic stress disorder)    Seasonal allergic rhinitis due to pollen    Sleep apnea    uses CPAP   SOB (shortness of breath)    Vitamin D deficiency     Past Surgical History:  Procedure Laterality Date   ABDOMINAL HYSTERECTOMY  2018   BREAST LUMPECTOMY Right 2013   GASTRIC BYPASS  2001    TOTAL KNEE ARTHROPLASTY Left 12/22/2018   Procedure: LEFT TOTAL KNEE ARTHROPLASTY;  Surgeon: Leandrew Koyanagi, MD;  Location: [facility];  Service: Orthopedics;  Laterality: Left;    There were no vitals filed for this visit.   Subjective Assessment - 05/26/21 1143     Subjective Stiff today, no major pains.  Walked a mile yesterday and also Tuesday.    Currently in Pain? No/denies                [facility] PT Assessment - 05/26/21 0001       AROM   Left Knee Flexion 104              NuStep lvl 5 UE/LE x 5 min while taking subjective   STS no UE support 10 lbs x 10 to squat x 15, min cues   Dead lift/hip hinge mod cues  10 lbs x 15   Upper trap stretch 30 sec x 2 each min cues   Standing at [place] with squat hold x 15   Extension with narrow stance x 10   Extension in SLS x 10   Supine heel slide L w/ strap  x 10 - 5" Hamstring stretch, lateral hip x 60 sec each  Supine SLR 2x10 ea toes up then toes out  Bridge 2x10  used ball Elevated feet for increased hamstring Ball squeeze with knee extension x 10 each   Lower trunk rotation with head turns x 5 each  Hip abduction x 10 each side Seated hamstring 30 sec x 2      PT Long Term Goals - 05/26/21 1220       PT LONG TERM GOAL #1   Title Pt wil increase L knee ROM >/= 5- 110 degrees for functional ROM required for efficient gait and ADLs    Status On-going      PT LONG TERM GOAL #2   Title pt to be able to stand/ walk >/= 45 min and navigate 12 steps reciprocally with LRAD for functional mobility required for community ambulation    Status On-going      PT LONG TERM GOAL #3   Title increase FOTO score to > 65% able for neck and knee to demo improvement in function    Status On-going      PT LONG TERM GOAL #4   Title Pt will no longer experience UE symptoms with UE activity, sitting at computer or lifting items in her home    Status On-going      PT LONG TERM GOAL #5   Title pt to be I with all  HEP given as of last visit to maintain and progress current level of funciton.    Status On-going                   Plan - 05/26/21 1145     Clinical Impression Statement Patient without pain during session, able to complete new add on to HEP with min cues.  She has anterior hip tightness, continues to show stiffness in knee both in flexion and extension.  Neck is less of a priority for her at this point as it is much better. Pans to go the the gym with a friend, but nutriitionist told her to wait until Jan.    PT Treatment/Interventions ADLs/Self Care Home Management;Electrical Stimulation;Aquatic Therapy;Cryotherapy;Moist Heat;Gait training;Functional mobility training;Therapeutic activities;Therapeutic exercise;Balance training;Patient/family education;Manual techniques;Dry needling;Passive range of motion;Taping    PT Next Visit Plan progress hip, core and periscapular strength as able    PT Home Exercise Plan [id number]    Consulted and Agree with Plan of Care Patient             Patient will benefit from skilled therapeutic intervention in order to improve the following deficits and impairments:  Decreased range of motion, Decreased strength, Pain, Obesity, Postural dysfunction, Impaired flexibility, Difficulty walking, Decreased mobility  Visit Diagnosis: Stiffness of left knee, not elsewhere classified  Difficulty in walking, not elsewhere classified  Cervicalgia     Problem List Patient Active Problem List   Diagnosis Date Noted   Pain due to onychomycosis of toenails of both feet 01/20/2020   Shortness of breath 10/05/2019   Status post total left knee replacement 12/22/2018   MDD (major depressive disorder), recurrent episode, mild ([place]) 10/29/2018   Autoimmune hepatitis ([place]) 10/29/2018   OSA (obstructive sleep apnea) 10/29/2018   Renal cyst, right 10/22/2018   Lung nodule, solitary 10/22/2018   Genital herpes simplex 10/29/2017   Vitamin D deficiency  10/29/2017   Class 2 obesity due to excess calories without serious comorbidity with body mass index (BMI) of 38.0 to 38.9 in adult 10/29/2017  Essential hypertension 10/29/2017   Pre-diabetes 02/14/2017   Fibromyalgia 02/14/2017   Substance abuse in remission ([place]) 02/14/2017   Anxiety and depression 02/14/2017   HX: breast cancer 02/14/2017   Urge incontinence 11/13/2016   Unilateral primary osteoarthritis, left knee 06/14/2016   GERD (gastroesophageal reflux disease) 11/02/2015   Angioedema 10/24/2013   Tobacco abuse 10/24/2013   DCIS (ductal carcinoma in situ) of breast 04/07/2013   Depression 04/07/2013    Nakai Pollio, PT 05/26/2021, 1:03 PM  [facility], [place], [address] Phone: [phone]   Fax:  [phone]  Name: Rachel Woodard MRN: [id number] Date of Birth: 02-Jun-1951   Raeford Razor, PT 05/26/21 1:04 PM Phone: [phone] Fax: [phone]

## 2021-05-29 ENCOUNTER — Ambulatory Visit: Payer: Medicare HMO | Admitting: Physical Therapy

## 2021-05-31 ENCOUNTER — Encounter (INDEPENDENT_AMBULATORY_CARE_PROVIDER_SITE_OTHER): Payer: Self-pay | Admitting: Family Medicine

## 2021-05-31 ENCOUNTER — Ambulatory Visit (INDEPENDENT_AMBULATORY_CARE_PROVIDER_SITE_OTHER): Payer: Medicare HMO | Admitting: Family Medicine

## 2021-05-31 ENCOUNTER — Other Ambulatory Visit: Payer: Self-pay

## 2021-05-31 VITALS — BP 104/70 | HR 68 | Temp 98.0°F | Ht 66.0 in | Wt 246.0 lb

## 2021-05-31 DIAGNOSIS — E669 Obesity, unspecified: Secondary | ICD-10-CM | POA: Diagnosis not present

## 2021-05-31 DIAGNOSIS — E559 Vitamin D deficiency, unspecified: Secondary | ICD-10-CM | POA: Diagnosis not present

## 2021-05-31 DIAGNOSIS — I10 Essential (primary) hypertension: Secondary | ICD-10-CM | POA: Diagnosis not present

## 2021-05-31 DIAGNOSIS — Z6841 Body Mass Index (BMI) 40.0 and over, adult: Secondary | ICD-10-CM

## 2021-05-31 MED ORDER — VITAMIN D (ERGOCALCIFEROL) 1.25 MG (50000 UNIT) PO CAPS: 50000.0000 [IU] | ORAL_CAPSULE | ORAL | 0 refills | Status: DC

## 2021-05-31 NOTE — Progress Notes (Signed)
Chief Complaint:   OBESITY Rachel Woodard is here to discuss her progress with her obesity treatment plan along with follow-up of her obesity related diagnoses. Rachel Woodard is on the Category 3 Plan and states she is following her eating plan approximately 98% of the time. Rachel Woodard states she is doing PT and walking 1 mile 45 minutes 2 times per week.  Today's visit was #: 5 Starting weight: 258 lbs Starting date: 03/14/2021 Today's weight: 246 lbs Today's date: 05/31/2021 Total lbs lost to date: 12 Total lbs lost since last in-office visit: 6  Interim History: Rachel Woodard started physical therapy twice a week and is using a lot of different machines to work upper and lower body. She is working on spreading out calories and nutrition throughout the day. The next few weeks, she is still going to PT and doesn't have any plans for the holidays. Pt went on YouTube to start exploring physical activity options.  Subjective:   1. Vitamin D deficiency Pt denies nausea, vomiting, and muscle weakness but notes fatigue. She is on prescription Vit D. Her last Vit D level was 33.0.  2. Essential hypertension BP well controlled. Pt denies chest pain/chest pressure/headache.  Assessment/Plan:   1. Vitamin D deficiency Low Vitamin D level contributes to fatigue and are associated with obesity, breast, and colon cancer. She agrees to continue to take prescription Vitamin D 50,000 IU every week and will follow-up for routine testing of Vitamin D, at least 2-3 times per year to avoid over-replacement.  Refill- Vitamin D, Ergocalciferol, (DRISDOL) 1.25 MG (50000 UNIT) CAPS capsule; Take 1 capsule (50,000 Units total) by mouth every 7 (seven) days.  Dispense: 4 capsule; Refill: 0  2. Essential hypertension Rachel Woodard is working on healthy weight loss and exercise to improve blood pressure control. We will watch for signs of hypotension as she continues her lifestyle modifications. BP at goal. Continue current treatment  plan.  3. Obesity with current BMI of 40.7  Rachel Woodard is currently in the action stage of change. As such, her goal is to continue with weight loss efforts. She has agreed to the Category 3 Plan.   Exercise goals: All adults should avoid inactivity. Some physical activity is better than none, and adults who participate in any amount of physical activity gain some health benefits.  Behavioral modification strategies: increasing lean protein intake, meal planning and cooking strategies, and keeping healthy foods in the home.  Rachel Woodard has agreed to follow-up with our clinic in 3 weeks. She was informed of the importance of frequent follow-up visits to maximize her success with intensive lifestyle modifications for her multiple health conditions.   Objective:   Blood pressure 104/70, pulse 68, temperature 98 F (36.7 C), height 5\' 6"  (1.676 m), weight 246 lb (111.6 kg), SpO2 98 %. Body mass index is 39.71 kg/m.  General: Cooperative, alert, well developed, in no acute distress. HEENT: Conjunctivae and lids unremarkable. Cardiovascular: Regular rhythm.  Lungs: Normal work of breathing. Neurologic: No focal deficits.   Lab Results  Component Value Date   CREATININE 1.15 (H) 03/14/2021   BUN 20 03/14/2021   NA 139 03/14/2021   K 3.9 03/14/2021   CL 102 03/14/2021   CO2 22 03/14/2021   Lab Results  Component Value Date   ALT 15 03/14/2021   AST 17 03/14/2021   ALKPHOS 94 03/14/2021   BILITOT 0.2 03/14/2021   Lab Results  Component Value Date   HGBA1C 6.0 (H) 03/14/2021   HGBA1C 5.7 (H) 11/11/2018  HGBA1C 5.8 (H) 04/24/2018   HGBA1C 5.7 (H) 01/22/2018   HGBA1C 5.9 07/11/2017   Lab Results  Component Value Date   INSULIN 14.4 03/14/2021   Lab Results  Component Value Date   TSH 3.320 03/14/2021   Lab Results  Component Value Date   CHOL 170 03/14/2021   HDL 53 03/14/2021   LDLCALC 99 03/14/2021   TRIG 99 03/14/2021   CHOLHDL 3.1 07/11/2017   Lab Results   Component Value Date   VD25OH 33.0 03/14/2021   VD25OH 27.7 (L) 04/24/2018   VD25OH 23.0 (L) 01/22/2018   Lab Results  Component Value Date   WBC 7.6 03/14/2021   HGB 13.3 03/14/2021   HCT 40.5 03/14/2021   MCV 88 03/14/2021   PLT 255 03/14/2021    Attestation Statements:   Reviewed by clinician on day of visit: allergies, medications, problem list, medical history, surgical history, family history, social history, and previous encounter notes.  Coral Ceo, CMA, am acting as transcriptionist for Coralie Common, MD.   I have reviewed the above documentation for accuracy and completeness, and I agree with the above. - Coralie Common, MD

## 2021-06-01 ENCOUNTER — Ambulatory Visit: Payer: Medicare HMO | Admitting: Physical Therapy

## 2021-06-01 ENCOUNTER — Encounter: Payer: Self-pay | Admitting: Physical Therapy

## 2021-06-01 DIAGNOSIS — M25662 Stiffness of left knee, not elsewhere classified: Secondary | ICD-10-CM

## 2021-06-01 DIAGNOSIS — M542 Cervicalgia: Secondary | ICD-10-CM

## 2021-06-01 DIAGNOSIS — R262 Difficulty in walking, not elsewhere classified: Secondary | ICD-10-CM

## 2021-06-01 NOTE — Therapy (Signed)
[place], [place], [address] Phone: [phone]   Fax:  [phone]  Physical Therapy Treatment  Patient Details  Name: Rachel Woodard MRN: [id number] Date of Birth: 06/24/1951 Referring Provider (PT): Dwana Melena, [place] , Dr Erlinda Hong /   Encounter Date: 06/01/2021   PT End of Session - 06/01/21 1109     Visit Number 4    Number of Visits 12    Date for PT Re-Evaluation 06/28/21    Authorization Type [employer]    PT Start Time 1104    PT Stop Time 3710    PT Time Calculation (min) 41 min    Activity Tolerance Patient tolerated treatment well    Behavior During Therapy [facility] for tasks assessed/performed             Past Medical History:  Diagnosis Date   Anemia 2001   after gastric bypass   Anxiety    Aortic atherosclerosis (HCC)    Arthritis    Back pain    Bilateral swelling of feet    Bipolar 1 disorder ([place])    Breast cancer ([place]) 2013   Breast cancer    Radiation therapy   Calcified granuloma of lung ([place])    Cigarette nicotine dependence    Cigarette nicotine dependence in remission    Cocaine dependence in remission ([place])    COPD (chronic obstructive pulmonary disease) ([place])    Depression    Disequilibrium    Fibromyalgia    GERD (gastroesophageal reflux disease)    HSV infection    Hypertension    Hypertensive kidney disease with stage 3a chronic kidney disease ([place])    Hypertensive kidney disease with stage 3b chronic kidney disease ([place])    Osteoarthritis    Personal history of infectious disease    Personal history of radiation therapy 2013   Pneumonia    Pre-diabetes    PTSD (post-traumatic stress disorder)    Seasonal allergic rhinitis due to pollen    Sleep apnea    uses CPAP   SOB (shortness of breath)    Vitamin D deficiency     Past Surgical History:  Procedure Laterality Date   ABDOMINAL HYSTERECTOMY  2018   BREAST LUMPECTOMY Right 2013   GASTRIC BYPASS  2001    TOTAL KNEE ARTHROPLASTY Left 12/22/2018   Procedure: LEFT TOTAL KNEE ARTHROPLASTY;  Surgeon: Leandrew Koyanagi, MD;  Location: [place];  Service: Orthopedics;  Laterality: Left;    There were no vitals filed for this visit.   Subjective Assessment - 06/01/21 1109     Subjective L knee is stiff, did some you tube exercises this morning .    Currently in Pain? No/denies   stiff                [facility] Adult PT Treatment/Exercise:   Therapeutic Exercise: Recumbent bike L6  Step ups 6 inch x 20  FW and then lateral x 10  Standing hip abduction x 10  Reverse step down x 15 and then step down 4 inch x 10 with UE then x 5 without  Seated LAQ x 15 , 6 lbs  Sit to stand with 15 lbs x 10 staggered step each side  Hamstring bridge x 15  Hamstring 3 x 20 sec each LE  SLR 3 way x 10 each LE , 2 lbs cuff Flexion, abduction and extension              PT Long  Term Goals - 05/26/21 1220       PT LONG TERM GOAL #1   Title Pt wil increase L knee ROM >/= 5- 110 degrees for functional ROM required for efficient gait and ADLs    Status On-going      PT LONG TERM GOAL #2   Title pt to be able to stand/ walk >/= 45 min and navigate 12 steps reciprocally with LRAD for functional mobility required for community ambulation    Status On-going      PT LONG TERM GOAL #3   Title increase FOTO score to > 65% able for neck and knee to demo improvement in function    Status On-going      PT LONG TERM GOAL #4   Title Pt will no longer experience UE symptoms with UE activity, sitting at computer or lifting items in her home    Status On-going      PT LONG TERM GOAL #5   Title pt to be I with all HEP given as of last visit to maintain and progress current level of funciton.    Status On-going                   Plan - 06/01/21 1139     Clinical Impression Statement Patient tolerates exercise well and reports no increased pain throughout.  She continue to have no neck or arm pain.  She  had some mild stiffness in knee as she came to clinic.  Encouraged her to continue walking and increase activity levels.    PT Treatment/Interventions ADLs/Self Care Home Management;Electrical Stimulation;Aquatic Therapy;Cryotherapy;Moist Heat;Gait training;Functional mobility training;Therapeutic activities;Therapeutic exercise;Balance training;Patient/family education;Manual techniques;Dry needling;Passive range of motion;Taping    PT Next Visit Plan progress hip, core and periscapular strength as able    PT Home Exercise Plan [id number]    Consulted and Agree with Plan of Care Patient             Patient will benefit from skilled therapeutic intervention in order to improve the following deficits and impairments:  Decreased range of motion, Decreased strength, Pain, Obesity, Postural dysfunction, Impaired flexibility, Difficulty walking, Decreased mobility  Visit Diagnosis: Stiffness of left knee, not elsewhere classified  Difficulty in walking, not elsewhere classified  Cervicalgia     Problem List Patient Active Problem List   Diagnosis Date Noted   Pain due to onychomycosis of toenails of both feet 01/20/2020   Shortness of breath 10/05/2019   Status post total left knee replacement 12/22/2018   MDD (major depressive disorder), recurrent episode, mild ([place]) 10/29/2018   Autoimmune hepatitis ([place]) 10/29/2018   OSA (obstructive sleep apnea) 10/29/2018   Renal cyst, right 10/22/2018   Lung nodule, solitary 10/22/2018   Genital herpes simplex 10/29/2017   Vitamin D deficiency 10/29/2017   Class 2 obesity due to excess calories without serious comorbidity with body mass index (BMI) of 38.0 to 38.9 in adult 10/29/2017   Essential hypertension 10/29/2017   Pre-diabetes 02/14/2017   Fibromyalgia 02/14/2017   Substance abuse in remission ([place]) 02/14/2017   Anxiety and depression 02/14/2017   HX: breast cancer 02/14/2017   Urge incontinence 11/13/2016   Unilateral primary  osteoarthritis, left knee 06/14/2016   GERD (gastroesophageal reflux disease) 11/02/2015   Angioedema 10/24/2013   Tobacco abuse 10/24/2013   DCIS (ductal carcinoma in situ) of breast 04/07/2013   Depression 04/07/2013    Baruch Lewers, PT 06/01/2021, 11:55 AM  [facility] [address] [place],  [place], [address] Phone: [phone]   Fax:  [phone]  Name: Darnell Stimson MRN: [id number] Date of Birth: [birth year]   Raeford Razor, PT 06/01/21 11:55 AM Phone: [phone] Fax: [phone]

## 2021-06-06 ENCOUNTER — Ambulatory Visit: Payer: Medicare HMO | Admitting: Physical Therapy

## 2021-06-06 ENCOUNTER — Encounter: Payer: Self-pay | Admitting: Physical Therapy

## 2021-06-06 ENCOUNTER — Other Ambulatory Visit: Payer: Self-pay

## 2021-06-06 DIAGNOSIS — M542 Cervicalgia: Secondary | ICD-10-CM

## 2021-06-06 DIAGNOSIS — M25662 Stiffness of left knee, not elsewhere classified: Secondary | ICD-10-CM | POA: Diagnosis not present

## 2021-06-06 DIAGNOSIS — R262 Difficulty in walking, not elsewhere classified: Secondary | ICD-10-CM

## 2021-06-06 NOTE — Therapy (Signed)
[place], [place], [address] Phone: [phone]   Fax:  [phone]  Physical Therapy Treatment  Patient Details  Name: Rachel Woodard MRN: [id number] Date of Birth: 03/18/1951 Referring Provider (PT): Dwana Melena, [place] , Dr Erlinda Hong /   Encounter Date: 06/06/2021   PT End of Session - 06/06/21 1115     Visit Number 5    Number of Visits 12    Date for PT Re-Evaluation 06/28/21    Authorization Type Hum[employer]    PT Start Time 1108    PT Stop Time 1150    PT Time Calculation (min) 42 min    Activity Tolerance Patient tolerated treatment well    Behavior During Therapy [facility] for tasks assessed/performed             Past Medical History:  Diagnosis Date   Anemia 2001   after gastric bypass   Anxiety    Aortic atherosclerosis (HCC)    Arthritis    Back pain    Bilateral swelling of feet    Bipolar 1 disorder ([place])    Breast cancer ([place]) 2013   Breast cancer    Radiation therapy   Calcified granuloma of lung ([place])    Cigarette nicotine dependence    Cigarette nicotine dependence in remission    Cocaine dependence in remission ([place])    COPD (chronic obstructive pulmonary disease) ([place])    Depression    Disequilibrium    Fibromyalgia    GERD (gastroesophageal reflux disease)    HSV infection    Hypertension    Hypertensive kidney disease with stage 3a chronic kidney disease ([place])    Hypertensive kidney disease with stage 3b chronic kidney disease ([place])    Osteoarthritis    Personal history of infectious disease    Personal history of radiation therapy 2013   Pneumonia    Pre-diabetes    PTSD (post-traumatic stress disorder)    Seasonal allergic rhinitis due to pollen    Sleep apnea    uses CPAP   SOB (shortness of breath)    Vitamin D deficiency     Past Surgical History:  Procedure Laterality Date   ABDOMINAL HYSTERECTOMY  2018   BREAST LUMPECTOMY Right 2013   GASTRIC BYPASS   2001   TOTAL KNEE ARTHROPLASTY Left 12/22/2018   Procedure: LEFT TOTAL KNEE ARTHROPLASTY;  Surgeon: Leandrew Koyanagi, MD;  Location: [place];  Service: Orthopedics;  Laterality: Left;    There were no vitals filed for this visit.   Subjective Assessment - 06/06/21 1114     Subjective The ice has really helped the stiffness.  Pt applied ice this AM and so it is good today.    Currently in Pain? No/denies             [facility] Adult PT Treatment/Exercise:   Therapeutic Exercise: Nustep L 6 UE and LE for 6 min  Step up LLE 8 inch.  Definite need for UE assist for balance.  Done bilaterally  Knee extension 20 lbs x 10 x 2 sets, knee flexion 25 lbs x 15  then x 10 LLE only  Alternating lunge forward x 10 , then x 10 reverse used counter for A  Split lunge x 10, cues to keep heel down Calf stretch with slantboard x 60 sec Sit to stand green looped theraband  Lateral mini squat walking  5 x 15 feet     Modalities: Cold pack L knee 10 min  Consider / progression for next session:    closed chain, strength      PT Long Term Goals - 06/06/21 1119       PT LONG TERM GOAL #2   Title pt to be able to stand/ walk >/= 45 min and navigate 12 steps reciprocally with LRAD for functional mobility required for community ambulation      PT LONG TERM GOAL #3   Title increase FOTO score to > 65% able for neck and knee to demo improvement in function      PT LONG TERM GOAL #4   Title Pt will no longer experience UE symptoms with UE activity, sitting at computer or lifting items in her home    Status Achieved      PT LONG TERM GOAL #5   Title pt to be I with all HEP given as of last visit to maintain and progress current level of funciton.    Status On-going                   Plan - 06/06/21 1126     Clinical Impression Statement Patient without pain today.  Addressed hip strength and balance with more dynamic lower body exercises.  She needs only 2 more visits to complete POC in  that time, will focus on general lower body strength and stability for HEP.  Walking 45 min at a time but does have mild to mod hip pain .  Knee pain/stiffness relieved with ice.    PT Treatment/Interventions ADLs/Self Care Home Management;Electrical Stimulation;Aquatic Therapy;Cryotherapy;Moist Heat;Gait training;Functional mobility training;Therapeutic activities;Therapeutic exercise;Balance training;Patient/family education;Manual techniques;Dry needling;Passive range of motion;Taping    PT Next Visit Plan progress hip, core and periscapular strength as able    PT Home Exercise Plan [id number]    Consulted and Agree with Plan of Care Patient             Patient will benefit from skilled therapeutic intervention in order to improve the following deficits and impairments:  Decreased range of motion, Decreased strength, Pain, Obesity, Postural dysfunction, Impaired flexibility, Difficulty walking, Decreased mobility  Visit Diagnosis: Stiffness of left knee, not elsewhere classified  Difficulty in walking, not elsewhere classified  Cervicalgia     Problem List Patient Active Problem List   Diagnosis Date Noted   Pain due to onychomycosis of toenails of both feet 01/20/2020   Shortness of breath 10/05/2019   Status post total left knee replacement 12/22/2018   MDD (major depressive disorder), recurrent episode, mild ([place]) 10/29/2018   Autoimmune hepatitis ([place]) 10/29/2018   OSA (obstructive sleep apnea) 10/29/2018   Renal cyst, right 10/22/2018   Lung nodule, solitary 10/22/2018   Genital herpes simplex 10/29/2017   Vitamin D deficiency 10/29/2017   Class 2 obesity due to excess calories without serious comorbidity with body mass index (BMI) of 38.0 to 38.9 in adult 10/29/2017   Essential hypertension 10/29/2017   Pre-diabetes 02/14/2017   Fibromyalgia 02/14/2017   Substance abuse in remission ([place]) 02/14/2017   Anxiety and depression 02/14/2017   HX: breast cancer 02/14/2017    Urge incontinence 11/13/2016   Unilateral primary osteoarthritis, left knee 06/14/2016   GERD (gastroesophageal reflux disease) 11/02/2015   Angioedema 10/24/2013   Tobacco abuse 10/24/2013   DCIS (ductal carcinoma in situ) of breast 04/07/2013   Depression 04/07/2013    Maddoxx Burkitt, PT 06/06/2021, 1:42 PM  [facility] [address] [place], [place], [address] Phone: [phone]   Fax:  [phone]  Name: Rachel Woodard MRN: [id number] Date of Birth: 1950/08/24   Raeford Razor, PT 06/06/21 1:42 PM Phone: [phone] Fax: [phone]

## 2021-06-09 ENCOUNTER — Other Ambulatory Visit: Payer: Self-pay

## 2021-06-09 ENCOUNTER — Ambulatory Visit: Payer: Medicare HMO | Admitting: Physical Therapy

## 2021-06-09 ENCOUNTER — Encounter: Payer: Self-pay | Admitting: Physical Therapy

## 2021-06-09 DIAGNOSIS — M542 Cervicalgia: Secondary | ICD-10-CM

## 2021-06-09 DIAGNOSIS — M25662 Stiffness of left knee, not elsewhere classified: Secondary | ICD-10-CM

## 2021-06-09 DIAGNOSIS — R262 Difficulty in walking, not elsewhere classified: Secondary | ICD-10-CM

## 2021-06-09 NOTE — Therapy (Signed)
[place], [place], [address] Phone: [phone]   Fax:  [phone]  Physical Therapy Treatment  Patient Details  Name: Rachel Woodard MRN: [id number] Date of Birth: Mar 26, 1951 Referring Provider (PT): Dwana Melena, [place] , Dr Erlinda Hong /   Encounter Date: 06/09/2021   PT End of Session - 06/09/21 1119     Visit Number 6    Number of Visits 12    Date for PT Re-Evaluation 06/28/21    Authorization Type [employer]    PT Start Time 1106    PT Stop Time 1148    PT Time Calculation (min) 42 min    Activity Tolerance Patient tolerated treatment well    Behavior During Therapy [facility] for tasks assessed/performed             Past Medical History:  Diagnosis Date   Anemia 2001   after gastric bypass   Anxiety    Aortic atherosclerosis (HCC)    Arthritis    Back pain    Bilateral swelling of feet    Bipolar 1 disorder ([place])    Breast cancer ([place]) 2013   Breast cancer    Radiation therapy   Calcified granuloma of lung ([place])    Cigarette nicotine dependence    Cigarette nicotine dependence in remission    Cocaine dependence in remission ([place])    COPD (chronic obstructive pulmonary disease) ([place])    Depression    Disequilibrium    Fibromyalgia    GERD (gastroesophageal reflux disease)    HSV infection    Hypertension    Hypertensive kidney disease with stage 3a chronic kidney disease ([place])    Hypertensive kidney disease with stage 3b chronic kidney disease ([place])    Osteoarthritis    Personal history of infectious disease    Personal history of radiation therapy 2013   Pneumonia    Pre-diabetes    PTSD (post-traumatic stress disorder)    Seasonal allergic rhinitis due to pollen    Sleep apnea    uses CPAP   SOB (shortness of breath)    Vitamin D deficiency     Past Surgical History:  Procedure Laterality Date   ABDOMINAL HYSTERECTOMY  2018   BREAST LUMPECTOMY Right 2013   GASTRIC BYPASS   2001   TOTAL KNEE ARTHROPLASTY Left 12/22/2018   Procedure: LEFT TOTAL KNEE ARTHROPLASTY;  Surgeon: Leandrew Koyanagi, MD;  Location: [place];  Service: Orthopedics;  Laterality: Left;    There were no vitals filed for this visit.   Subjective Assessment - 06/09/21 1114     Subjective My knee stopped hurting the other day! I havent use ice.    Currently in Pain? No/denies             [facility] Adult PT Treatment/Exercise:   Therapeutic Exercise: NuStep L 6 UE and LE for 6 min  Standing step up to hip abduction x 15 , diagonal lift (ext/abd) x 10  Squat on Airex x 10 x 2 , needed tactile cue  Reverse lunge to SLS 1 UE Assist  x 10 each side  Standing balance on Airex : head turns and eyes closed , had nods  SLS with small controled circles, needs UE assist  Supine hamstring stretch 30 sec x 3 , ITB as well  SLR flexion/abd combo x 10  Hip flexed blue band quad arc x 10 Bilateral clam blue band x 10  Banded bridges x 15  Prone quad stretch with  strap 30 sec x 3     Consider / progression for next session:    DC, FOTO        PT Long Term Goals - 06/09/21 1319       PT LONG TERM GOAL #1   Title Pt wil increase L knee ROM >/= 5- 110 degrees for functional ROM required for efficient gait and ADLs    Status Achieved      PT LONG TERM GOAL #2   Title pt to be able to stand/ walk >/= 45 min and navigate 12 steps reciprocally with LRAD for functional mobility required for community ambulation    Status Achieved      PT LONG TERM GOAL #3   Title increase FOTO score to > 65% able for neck and knee to demo improvement in function    Status Unable to assess      PT LONG TERM GOAL #4   Title Pt will no longer experience UE symptoms with UE activity, sitting at computer or lifting items in her home    Status Achieved      PT LONG TERM GOAL #5   Title pt to be I with all HEP given as of last visit to maintain and progress current level of funciton.    Status On-going                    Plan - 06/09/21 1153     Clinical Impression Statement Patient without knee pain .  Worked on quad and hip strength in mostly standing.  She plans to walk this weekend and will be DC next week.    PT Treatment/Interventions ADLs/Self Care Home Management;Electrical Stimulation;Aquatic Therapy;Cryotherapy;Moist Heat;Gait training;Functional mobility training;Therapeutic activities;Therapeutic exercise;Balance training;Patient/family education;Manual techniques;Dry needling;Passive range of motion;Taping    PT Next Visit Plan progress hip, core and periscapular strength as able    PT Home Exercise Plan [id number]    Consulted and Agree with Plan of Care Patient             Patient will benefit from skilled therapeutic intervention in order to improve the following deficits and impairments:  Decreased range of motion, Decreased strength, Pain, Obesity, Postural dysfunction, Impaired flexibility, Difficulty walking, Decreased mobility  Visit Diagnosis: Stiffness of left knee, not elsewhere classified  Difficulty in walking, not elsewhere classified  Cervicalgia     Problem List Patient Active Problem List   Diagnosis Date Noted   Pain due to onychomycosis of toenails of both feet 01/20/2020   Shortness of breath 10/05/2019   Status post total left knee replacement 12/22/2018   MDD (major depressive disorder), recurrent episode, mild ([place]) 10/29/2018   Autoimmune hepatitis ([place]) 10/29/2018   OSA (obstructive sleep apnea) 10/29/2018   Renal cyst, right 10/22/2018   Lung nodule, solitary 10/22/2018   Genital herpes simplex 10/29/2017   Vitamin D deficiency 10/29/2017   Class 2 obesity due to excess calories without serious comorbidity with body mass index (BMI) of 38.0 to 38.9 in adult 10/29/2017   Essential hypertension 10/29/2017   Pre-diabetes 02/14/2017   Fibromyalgia 02/14/2017   Substance abuse in remission ([place]) 02/14/2017   Anxiety and depression 02/14/2017    HX: breast cancer 02/14/2017   Urge incontinence 11/13/2016   Unilateral primary osteoarthritis, left knee 06/14/2016   GERD (gastroesophageal reflux disease) 11/02/2015   Angioedema 10/24/2013   Tobacco abuse 10/24/2013   DCIS (ductal carcinoma in situ) of breast 04/07/2013   Depression 04/07/2013    Jermayne Sweeney,  PT 06/09/2021, 1:21 PM  [place], [place], [address] Phone: [phone]   Fax:  [phone]  Name: Rachel Woodard MRN: [id number] Date of Birth: Jan 17, 1951  Raeford Razor, PT 06/09/21 1:21 PM Phone: [phone] Fax: [phone]

## 2021-06-13 ENCOUNTER — Encounter: Payer: Medicare HMO | Admitting: Physical Therapy

## 2021-06-15 ENCOUNTER — Ambulatory Visit: Payer: Medicare HMO

## 2021-06-20 ENCOUNTER — Other Ambulatory Visit (INDEPENDENT_AMBULATORY_CARE_PROVIDER_SITE_OTHER): Payer: Self-pay | Admitting: Family Medicine

## 2021-06-20 DIAGNOSIS — E559 Vitamin D deficiency, unspecified: Secondary | ICD-10-CM

## 2021-06-21 ENCOUNTER — Other Ambulatory Visit: Payer: Self-pay

## 2021-06-21 ENCOUNTER — Encounter (INDEPENDENT_AMBULATORY_CARE_PROVIDER_SITE_OTHER): Payer: Self-pay | Admitting: Adult Health

## 2021-06-21 ENCOUNTER — Ambulatory Visit (INDEPENDENT_AMBULATORY_CARE_PROVIDER_SITE_OTHER): Payer: Medicare HMO | Admitting: Adult Health

## 2021-06-21 VITALS — BP 121/75 | HR 68 | Temp 98.0°F | Ht 66.0 in | Wt 246.0 lb

## 2021-06-21 DIAGNOSIS — R7303 Prediabetes: Secondary | ICD-10-CM

## 2021-06-21 DIAGNOSIS — Z6841 Body Mass Index (BMI) 40.0 and over, adult: Secondary | ICD-10-CM | POA: Diagnosis not present

## 2021-06-21 NOTE — Progress Notes (Signed)
Chief Complaint:   OBESITY Rachel Woodard is here to discuss her progress with her obesity treatment plan along with follow-up of her obesity related diagnoses. [name] is on the Category 3 Plan and states she is following her eating plan approximately 90% of the time. [name] states she is doing PT/walking for 45-60 minutes 1-2 times per week.  Today's visit was #: 6 Starting weight: 258 lbs Starting date: 03/14/2021 Today's weight: 246 lbs Today's date: 06/21/2021 Total lbs lost to date: 12 lbs Total lbs lost since last in-office visit: 0  Interim History:  [name] will complete outpatient PT this Friday, 06/23/2021. As a result of the therapy-she reports less bilateral knee stiffness, improved mobility. She states, "I'm ready for the gym" and when weather improves - she plans on to start walking outside.  Of note:  She has lived in [place] twice - father served in the [employer].  Her Father's 90th birthday is next year.  Subjective:   1. Prediabetes On 03/14/2021, A1c was 6.0 with elevated insulin of 14.4. She is not on any BG lowering medications. She denies polyphagia. Family history of T2D - one brother.  Assessment/Plan:   1. Prediabetes Monitor labs, continue Category 3 meal plan.  2. Obesity with current BMI of 39.7  [name] is currently in the action stage of change. As such, her goal is to continue with weight loss efforts. She has agreed to the Category 3 Plan.   Exercise goals:  As is.  Behavioral modification strategies: increasing lean protein intake, decreasing simple carbohydrates, meal planning and cooking strategies, keeping healthy foods in the home, and planning for success.  [name] has agreed to follow-up with our clinic in 3-4 weeks. She was informed of the importance of frequent follow-up visits to maximize her success with intensive lifestyle modifications for her multiple health conditions.   Objective:   Blood pressure 121/75, pulse  68, temperature 98 F (36.7 C), temperature source Oral, height 5\' 6"  (1.676 m), weight 246 lb (111.6 kg), SpO2 96 %. Body mass index is 39.71 kg/m.  General: Cooperative, alert, well developed, in no acute distress. HEENT: Conjunctivae and lids unremarkable. Cardiovascular: Regular rhythm.  Lungs: Normal work of breathing. Neurologic: No focal deficits.   Lab Results  Component Value Date   CREATININE 1.15 (H) 03/14/2021   BUN 20 03/14/2021   NA 139 03/14/2021   K 3.9 03/14/2021   CL 102 03/14/2021   CO2 22 03/14/2021   Lab Results  Component Value Date   ALT 15 03/14/2021   AST 17 03/14/2021   ALKPHOS 94 03/14/2021   BILITOT 0.2 03/14/2021   Lab Results  Component Value Date   HGBA1C 6.0 (H) 03/14/2021   HGBA1C 5.7 (H) 11/11/2018   HGBA1C 5.8 (H) 04/24/2018   HGBA1C 5.7 (H) 01/22/2018   HGBA1C 5.9 07/11/2017   Lab Results  Component Value Date   INSULIN 14.4 03/14/2021   Lab Results  Component Value Date   TSH 3.320 03/14/2021   Lab Results  Component Value Date   CHOL 170 03/14/2021   HDL 53 03/14/2021   LDLCALC 99 03/14/2021   TRIG 99 03/14/2021   CHOLHDL 3.1 07/11/2017   Lab Results  Component Value Date   VD25OH 33.0 03/14/2021   VD25OH 27.7 (L) 04/24/2018   VD25OH 23.0 (L) 01/22/2018   Lab Results  Component Value Date   WBC 7.6 03/14/2021   HGB 13.3 03/14/2021   HCT 40.5 03/14/2021   MCV 88 03/14/2021  PLT 255 03/14/2021   Attestation Statements:   Reviewed by clinician on day of visit: allergies, medications, problem list, medical history, surgical history, family history, social history, and previous encounter notes.  Time spent on visit including pre-visit chart review and post-visit care and charting was 28 minutes.   I, Water quality scientist, CMA, am acting as Location manager for Mina Marble, NP.  I have reviewed the above documentation for accuracy and completeness, and I agree with the above. -  Nichlas Pitera d. Amarii Amy, NP-C

## 2021-06-23 ENCOUNTER — Other Ambulatory Visit: Payer: Self-pay

## 2021-06-23 ENCOUNTER — Ambulatory Visit: Payer: Medicare HMO | Admitting: Physical Therapy

## 2021-06-23 ENCOUNTER — Encounter: Payer: Self-pay | Admitting: Physical Therapy

## 2021-06-23 DIAGNOSIS — R262 Difficulty in walking, not elsewhere classified: Secondary | ICD-10-CM

## 2021-06-23 DIAGNOSIS — M25662 Stiffness of left knee, not elsewhere classified: Secondary | ICD-10-CM | POA: Diagnosis not present

## 2021-06-23 DIAGNOSIS — M542 Cervicalgia: Secondary | ICD-10-CM

## 2021-06-23 NOTE — Therapy (Addendum)
[place], [place], [address] Phone: [phone]   Fax:  [phone]  Physical Therapy Treatment/Discharge  Patient Details  Name: Rachel Woodard MRN: [id number] Date of Birth: [birth year] Referring Provider (PT): Dwana Melena, [place] , Dr Erlinda Hong /   Encounter Date: 06/23/2021   PT End of Session - 06/23/21 0856     Visit Number 7    Number of Visits 12    Date for PT Re-Evaluation 06/28/21    Authorization Type [employer]    PT Start Time 0845    PT Stop Time 0920    PT Time Calculation (min) 35 min    Activity Tolerance Patient tolerated treatment well    Behavior During Therapy [facility] for tasks assessed/performed             Past Medical History:  Diagnosis Date   Anemia 2001   after gastric bypass   Anxiety    Aortic atherosclerosis (HCC)    Arthritis    Back pain    Bilateral swelling of feet    Bipolar 1 disorder ([place])    Breast cancer ([place]) 2013   Breast cancer    Radiation therapy   Calcified granuloma of lung ([place])    Cigarette nicotine dependence    Cigarette nicotine dependence in remission    Cocaine dependence in remission ([place])    COPD (chronic obstructive pulmonary disease) (HCC)    Depression    Disequilibrium    Fibromyalgia    GERD (gastroesophageal reflux disease)    HSV infection    Hypertension    Hypertensive kidney disease with stage 3a chronic kidney disease ([place])    Hypertensive kidney disease with stage 3b chronic kidney disease ([place])    Osteoarthritis    Personal history of infectious disease    Personal history of radiation therapy 2013   Pneumonia    Pre-diabetes    PTSD (post-traumatic stress disorder)    Seasonal allergic rhinitis due to pollen    Sleep apnea    uses CPAP   SOB (shortness of breath)    Vitamin D deficiency     Past Surgical History:  Procedure Laterality Date   ABDOMINAL HYSTERECTOMY  2018   BREAST LUMPECTOMY Right 2013   GASTRIC  BYPASS  2001   TOTAL KNEE ARTHROPLASTY Left 12/22/2018   Procedure: LEFT TOTAL KNEE ARTHROPLASTY;  Surgeon: Leandrew Koyanagi, MD;  Location: [facility];  Service: Orthopedics;  Laterality: Left;    There were no vitals filed for this visit.   Subjective Assessment - 06/23/21 0851     Subjective I haven't had knee pain at all. Is maintaining her weight.    Currently in Pain? No/denies              [facility] Adult PT Treatment/Exercise:   Therapeutic Exercise: Nustep L5 UE and LE for 6 min  SLR x 15 each LE Bridging x 15 Hamstring and ITB stretch, 30 sec x 3 each    gait with head turns, nods Tandem stance with head turns   Self Care: DC, FOTO, exercise progressions and goals  Balance and dynamic gait index     MMT knees 5/5 AROM in L/R knee 5-110 deg.     PT Education - 06/23/21 0855     Education Details HEP, DC, FOTO    Person(s) Educated Patient    Methods Explanation    Comprehension Verbalized understanding  PT Long Term Goals - 06/23/21 0903       PT LONG TERM GOAL #1   Title Pt wil increase L knee ROM >/= 5- 110 degrees for functional ROM required for efficient gait and ADLs    Status Achieved      PT LONG TERM GOAL #2   Title pt to be able to stand/ walk >/= 45 min and navigate 12 steps reciprocally with LRAD for functional mobility required for community ambulation    Status Achieved      PT LONG TERM GOAL #3   Title increase FOTO score to > 65% able for neck and knee to demo improvement in function    Baseline 74%    Status Achieved      PT LONG TERM GOAL #4   Title Pt will no longer experience UE symptoms with UE activity, sitting at computer or lifting items in her home    Status Achieved      PT LONG TERM GOAL #5   Title pt to be I with all HEP given as of last visit to maintain and progress current level of funciton.    Status Achieved                   Plan - 06/23/21 0856     Clinical Impression Statement  Patient has met her goals and is ready for discharge.  She was been able to return to walking and doing HEP at home.  Her FOTO score is    PT Treatment/Interventions ADLs/Self Care Home Management;Electrical Stimulation;Aquatic Therapy;Cryotherapy;Moist Heat;Gait training;Functional mobility training;Therapeutic activities;Therapeutic exercise;Balance training;Patient/family education;Manual techniques;Dry needling;Passive range of motion;Taping    PT Home Exercise Plan [id number]             Patient will benefit from skilled therapeutic intervention in order to improve the following deficits and impairments:  Decreased range of motion, Decreased strength, Pain, Obesity, Postural dysfunction, Impaired flexibility, Difficulty walking, Decreased mobility  Visit Diagnosis: Stiffness of left knee, not elsewhere classified  Difficulty in walking, not elsewhere classified  Cervicalgia     Problem List Patient Active Problem List   Diagnosis Date Noted   Pain due to onychomycosis of toenails of both feet 01/20/2020   Shortness of breath 10/05/2019   Status post total left knee replacement 12/22/2018   MDD (major depressive disorder), recurrent episode, mild ([place]) 10/29/2018   Autoimmune hepatitis ([place]) 10/29/2018   OSA (obstructive sleep apnea) 10/29/2018   Renal cyst, right 10/22/2018   Lung nodule, solitary 10/22/2018   Genital herpes simplex 10/29/2017   Vitamin D deficiency 10/29/2017   Class 3 severe obesity with serious comorbidity and body mass index (BMI) of 40.0 to 44.9 in adult [facility]) 10/29/2017   Essential hypertension 10/29/2017   Prediabetes 02/14/2017   Fibromyalgia 02/14/2017   Substance abuse in remission ([place]) 02/14/2017   Anxiety and depression 02/14/2017   HX: breast cancer 02/14/2017   Urge incontinence 11/13/2016   Unilateral primary osteoarthritis, left knee 06/14/2016   GERD (gastroesophageal reflux disease) 11/02/2015   Angioedema 10/24/2013   Tobacco abuse  10/24/2013   DCIS (ductal carcinoma in situ) of breast 04/07/2013   Depression 04/07/2013    Cordarrell Sane, PT 06/23/2021, 9:32 AM  [facility] [address] [place], [place], [address] Phone: [phone]   Fax:  [phone]  Name: Rachel Woodard MRN: [id number] Date of Birth: 1950/06/27   Raeford Razor, PT 06/23/21 9:32 AM Phone: [phone] Fax: [phone]   PHYSICAL THERAPY  DISCHARGE SUMMARY  Visits from Start of Care: 7  Current functional level related to goals / functional outcomes: See above    Remaining deficits: None limiting function    Education / Equipment: HEP, posture, exercise   Patient agrees to discharge. Patient goals were met. Patient is being discharged due to meeting the stated rehab goals.    Raeford Razor, PT 06/23/21 9:32 AM Phone: [phone] Fax: [phone]

## 2021-07-12 ENCOUNTER — Other Ambulatory Visit: Payer: Self-pay

## 2021-07-12 ENCOUNTER — Ambulatory Visit (INDEPENDENT_AMBULATORY_CARE_PROVIDER_SITE_OTHER): Payer: Medicare HMO | Admitting: Family Medicine

## 2021-07-12 ENCOUNTER — Encounter (INDEPENDENT_AMBULATORY_CARE_PROVIDER_SITE_OTHER): Payer: Self-pay | Admitting: Family Medicine

## 2021-07-12 VITALS — BP 137/79 | HR 86 | Temp 98.2°F | Ht 66.0 in | Wt 247.0 lb

## 2021-07-12 DIAGNOSIS — R7303 Prediabetes: Secondary | ICD-10-CM

## 2021-07-12 DIAGNOSIS — Z6839 Body mass index (BMI) 39.0-39.9, adult: Secondary | ICD-10-CM | POA: Diagnosis not present

## 2021-07-12 DIAGNOSIS — E559 Vitamin D deficiency, unspecified: Secondary | ICD-10-CM

## 2021-07-12 DIAGNOSIS — Z6841 Body Mass Index (BMI) 40.0 and over, adult: Secondary | ICD-10-CM

## 2021-07-12 DIAGNOSIS — E669 Obesity, unspecified: Secondary | ICD-10-CM

## 2021-07-12 MED ORDER — VITAMIN D (ERGOCALCIFEROL) 1.25 MG (50000 UNIT) PO CAPS: 50000.0000 [IU] | ORAL_CAPSULE | ORAL | 0 refills | Status: DC

## 2021-07-12 NOTE — Progress Notes (Signed)
Chief Complaint:   OBESITY Rachel Woodard is here to discuss her progress with her obesity treatment plan along with follow-up of her obesity related diagnoses. Rachel Woodard is on the Category 3 Plan and states she is following her eating plan approximately 98-99% of the time. Rachel Woodard states she is doing weights, stretch bands, or walking 1 mile 15-20 minutes 1 times per week.  Today's visit was #: 7 Starting weight: 258 lbs Starting date: 03/14/2021 Today's weight: 247 lbs Today's date: 07/12/2021 Total lbs lost to date: 11 Total lbs lost since last in-office visit: 0  Interim History: Pt had a quiet holiday- saw her girlfriend from [place]. She is trying to stick to the plan as much as she can. Breakfast= 2 pieces of toast, 2 packs of grits, 2 pieces bacon, 2 eggs; skipped lunch yesterday; Supper- cheddar bake with string beans, 1/2 ribeye steak and [place] toast; After supper snack- bar and peanut butter and jelly sandwich.  Subjective:   1. Vitamin D deficiency Pt denies nausea, vomiting, and muscle weakness but notes fatigue. She is on prescription Vit D.  2. Prediabetes Pt's last A1c was 6.0 and she is not on meds.  Assessment/Plan:   1. Vitamin D deficiency Low Vitamin D level contributes to fatigue and are associated with obesity, breast, and colon cancer. She agrees to continue to take prescription Vitamin D 50,000 IU every week and will follow-up for routine testing of Vitamin D, at least 2-3 times per year to avoid over-replacement.  Refill- Vitamin D, Ergocalciferol, (DRISDOL) 1.25 MG (50000 UNIT) CAPS capsule; Take 1 capsule (50,000 Units total) by mouth every 7 (seven) days.  Dispense: 4 capsule; Refill: 0  2. Prediabetes Rachel Woodard will continue to work on weight loss, exercise, and decreasing simple carbohydrates to help decrease the risk of diabetes. Repeat labs with PCP.  3. Obesity with current BMI of 39.9  Rachel Woodard is currently in the action stage of change. As such, her  goal is to continue with weight loss efforts. She has agreed to keeping a food journal and adhering to recommended goals of 1350-1500 calories and 90+ grams protein.   Exercise goals: All adults should avoid inactivity. Some physical activity is better than none, and adults who participate in any amount of physical activity gain some health benefits. Work up to 3 times a week.  Behavioral modification strategies: increasing lean protein intake, meal planning and cooking strategies, keeping healthy foods in the home, planning for success, and keeping a strict food journal.  Rachel Woodard has agreed to follow-up with our clinic in 3 weeks. She was informed of the importance of frequent follow-up visits to maximize her success with intensive lifestyle modifications for her multiple health conditions.   Objective:   Blood pressure 137/79, pulse 86, temperature 98.2 F (36.8 C), height 5\' 6"  (1.676 m), weight 247 lb (112 kg), SpO2 100 %. Body mass index is 39.87 kg/m.  General: Cooperative, alert, well developed, in no acute distress. HEENT: Conjunctivae and lids unremarkable. Cardiovascular: Regular rhythm.  Lungs: Normal work of breathing. Neurologic: No focal deficits.   Lab Results  Component Value Date   CREATININE 1.15 (H) 03/14/2021   BUN 20 03/14/2021   NA 139 03/14/2021   K 3.9 03/14/2021   CL 102 03/14/2021   CO2 22 03/14/2021   Lab Results  Component Value Date   ALT 15 03/14/2021   AST 17 03/14/2021   ALKPHOS 94 03/14/2021   BILITOT 0.2 03/14/2021   Lab Results  Component  Value Date   HGBA1C 6.0 (H) 03/14/2021   HGBA1C 5.7 (H) 11/11/2018   HGBA1C 5.8 (H) 04/24/2018   HGBA1C 5.7 (H) 01/22/2018   HGBA1C 5.9 07/11/2017   Lab Results  Component Value Date   INSULIN 14.4 03/14/2021   Lab Results  Component Value Date   TSH 3.320 03/14/2021   Lab Results  Component Value Date   CHOL 170 03/14/2021   HDL 53 03/14/2021   LDLCALC 99 03/14/2021   TRIG 99 03/14/2021    CHOLHDL 3.1 07/11/2017   Lab Results  Component Value Date   VD25OH 33.0 03/14/2021   VD25OH 27.7 (L) 04/24/2018   VD25OH 23.0 (L) 01/22/2018   Lab Results  Component Value Date   WBC 7.6 03/14/2021   HGB 13.3 03/14/2021   HCT 40.5 03/14/2021   MCV 88 03/14/2021   PLT 255 03/14/2021     Attestation Statements:   Reviewed by clinician on day of visit: allergies, medications, problem list, medical history, surgical history, family history, social history, and previous encounter notes.  Coral Ceo, CMA, am acting as transcriptionist for Coralie Common, MD.   I have reviewed the above documentation for accuracy and completeness, and I agree with the above. - Coralie Common, MD

## 2021-08-01 ENCOUNTER — Other Ambulatory Visit: Payer: Self-pay

## 2021-08-01 ENCOUNTER — Encounter (INDEPENDENT_AMBULATORY_CARE_PROVIDER_SITE_OTHER): Payer: Self-pay | Admitting: Family Medicine

## 2021-08-01 ENCOUNTER — Ambulatory Visit (INDEPENDENT_AMBULATORY_CARE_PROVIDER_SITE_OTHER): Payer: Medicare HMO | Admitting: Family Medicine

## 2021-08-01 VITALS — BP 113/71 | HR 75 | Temp 98.6°F | Ht 66.0 in | Wt 243.0 lb

## 2021-08-01 DIAGNOSIS — R739 Hyperglycemia, unspecified: Secondary | ICD-10-CM

## 2021-08-01 DIAGNOSIS — E669 Obesity, unspecified: Secondary | ICD-10-CM | POA: Diagnosis not present

## 2021-08-01 DIAGNOSIS — Z6839 Body mass index (BMI) 39.0-39.9, adult: Secondary | ICD-10-CM

## 2021-08-01 DIAGNOSIS — E559 Vitamin D deficiency, unspecified: Secondary | ICD-10-CM | POA: Diagnosis not present

## 2021-08-01 DIAGNOSIS — Z6841 Body Mass Index (BMI) 40.0 and over, adult: Secondary | ICD-10-CM

## 2021-08-01 MED ORDER — VITAMIN D (ERGOCALCIFEROL) 1.25 MG (50000 UNIT) PO CAPS: 50000.0000 [IU] | ORAL_CAPSULE | ORAL | 0 refills | Status: DC

## 2021-08-02 NOTE — Progress Notes (Signed)
Chief Complaint:   OBESITY Rachel Woodard is here to discuss her progress with her obesity treatment plan along with follow-up of her obesity related diagnoses. Rachel Woodard is on keeping a food journal and adhering to recommended goals of 1350-1500 calories and 90+ grams protein and states she is following her eating plan approximately 90% of the time. Rachel Woodard states she is not currently exercising.  Today's visit was #: 8 Starting weight: 258 lbs Starting date: 03/14/2021 Today's weight: 243 lbs Today's date: 08/01/2021 Total lbs lost to date: 15 Total lbs lost since last in-office visit: 4  Interim History: Pt has not gotten on the scale for 2 weeks and has increased protein intake over the last few weeks. She is looking at grams of protein and aiming for 30 grams per meal. Pt reports a little hunger at night. Her calorie intake daily has been skewed because she has been eating out more. She now has access to a vehicle and will be going to the gym.  Subjective:   1. Vitamin D deficiency Rachel Woodard denies nausea, vomiting, and muscle weakness but notes fatigue. She is on prescription Vit D. Her last Vit D level was 33.0.  2. Hyperglycemia Pt has had elevated BS. Her last A1c was 6.0 and she is not on meds.  Assessment/Plan:   1. Vitamin D deficiency Low Vitamin D level contributes to fatigue and are associated with obesity, breast, and colon cancer. She agrees to continue to take prescription Vitamin D 50,000 IU every week and will follow-up for routine testing of Vitamin D, at least 2-3 times per year to avoid over-replacement.  Refill- Vitamin D, Ergocalciferol, (DRISDOL) 1.25 MG (50000 UNIT) CAPS capsule; Take 1 capsule (50,000 Units total) by mouth every 7 (seven) days.  Dispense: 4 capsule; Refill: 0  2. Hyperglycemia Labs will be obtained in the next month and results with be discussed with Rachel Woodard at her subsequent follow up visit. In the meanwhile Rachel Woodard was started on a lower simple  carbohydrate diet and will work on weight loss efforts.  3. Obesity with current BMI of 39.3 Rachel Woodard is currently in the action stage of change. As such, her goal is to continue with weight loss efforts. She has agreed to keeping a food journal and adhering to recommended goals of 1350-1500 calories and 90+ grams protein.   Exercise goals: All adults should avoid inactivity. Some physical activity is better than none, and adults who participate in any amount of physical activity gain some health benefits.  Behavioral modification strategies: increasing lean protein intake, meal planning and cooking strategies, and keeping healthy foods in the home.  Rachel Woodard has agreed to follow-up with our clinic in 3 weeks. She was informed of the importance of frequent follow-up visits to maximize her success with intensive lifestyle modifications for her multiple health conditions.   Objective:   Blood pressure 113/71, pulse 75, temperature 98.6 F (37 C), height 5\' 6"  (1.676 m), weight 243 lb (110.2 kg). Body mass index is 39.22 kg/m.  General: Cooperative, alert, well developed, in no acute distress. HEENT: Conjunctivae and lids unremarkable. Cardiovascular: Regular rhythm.  Lungs: Normal work of breathing. Neurologic: No focal deficits.   Lab Results  Component Value Date   CREATININE 1.15 (H) 03/14/2021   BUN 20 03/14/2021   NA 139 03/14/2021   K 3.9 03/14/2021   CL 102 03/14/2021   CO2 22 03/14/2021   Lab Results  Component Value Date   ALT 15 03/14/2021   AST 17  03/14/2021   ALKPHOS 94 03/14/2021   BILITOT 0.2 03/14/2021   Lab Results  Component Value Date   HGBA1C 6.0 (H) 03/14/2021   HGBA1C 5.7 (H) 11/11/2018   HGBA1C 5.8 (H) 04/24/2018   HGBA1C 5.7 (H) 01/22/2018   HGBA1C 5.9 07/11/2017   Lab Results  Component Value Date   INSULIN 14.4 03/14/2021   Lab Results  Component Value Date   TSH 3.320 03/14/2021   Lab Results  Component Value Date   CHOL 170 03/14/2021    HDL 53 03/14/2021   LDLCALC 99 03/14/2021   TRIG 99 03/14/2021   CHOLHDL 3.1 07/11/2017   Lab Results  Component Value Date   VD25OH 33.0 03/14/2021   VD25OH 27.7 (L) 04/24/2018   VD25OH 23.0 (L) 01/22/2018   Lab Results  Component Value Date   WBC 7.6 03/14/2021   HGB 13.3 03/14/2021   HCT 40.5 03/14/2021   MCV 88 03/14/2021   PLT 255 03/14/2021   Attestation Statements:   Reviewed by clinician on day of visit: allergies, medications, problem list, medical history, surgical history, family history, social history, and previous encounter notes.  Coral Ceo, CMA, am acting as transcriptionist for Coralie Common, MD.   I have reviewed the above documentation for accuracy and completeness, and I agree with the above. - Coralie Common, MD

## 2021-08-14 ENCOUNTER — Other Ambulatory Visit: Payer: Self-pay | Admitting: Student

## 2021-08-14 DIAGNOSIS — Z1231 Encounter for screening mammogram for malignant neoplasm of breast: Secondary | ICD-10-CM

## 2021-08-29 ENCOUNTER — Ambulatory Visit (INDEPENDENT_AMBULATORY_CARE_PROVIDER_SITE_OTHER): Payer: Medicare HMO | Admitting: Family Medicine

## 2021-08-31 ENCOUNTER — Encounter (INDEPENDENT_AMBULATORY_CARE_PROVIDER_SITE_OTHER): Payer: Self-pay | Admitting: Physician Assistant

## 2021-08-31 ENCOUNTER — Other Ambulatory Visit: Payer: Self-pay

## 2021-08-31 ENCOUNTER — Ambulatory Visit (INDEPENDENT_AMBULATORY_CARE_PROVIDER_SITE_OTHER): Payer: Medicare HMO | Admitting: Physician Assistant

## 2021-08-31 VITALS — BP 123/74 | HR 71 | Temp 98.0°F | Ht 66.0 in | Wt 246.0 lb

## 2021-08-31 DIAGNOSIS — E559 Vitamin D deficiency, unspecified: Secondary | ICD-10-CM

## 2021-08-31 DIAGNOSIS — R7303 Prediabetes: Secondary | ICD-10-CM

## 2021-08-31 DIAGNOSIS — E669 Obesity, unspecified: Secondary | ICD-10-CM | POA: Diagnosis not present

## 2021-08-31 DIAGNOSIS — Z6841 Body Mass Index (BMI) 40.0 and over, adult: Secondary | ICD-10-CM

## 2021-08-31 DIAGNOSIS — Z6839 Body mass index (BMI) 39.0-39.9, adult: Secondary | ICD-10-CM

## 2021-08-31 MED ORDER — VITAMIN D (ERGOCALCIFEROL) 1.25 MG (50000 UNIT) PO CAPS: 50000.0000 [IU] | ORAL_CAPSULE | ORAL | 0 refills | Status: DC

## 2021-08-31 MED ORDER — WEGOVY 0.25 MG/0.5ML ~~LOC~~ SOAJ: 0.2500 mg | SUBCUTANEOUS | 0 refills | Status: DC

## 2021-09-04 NOTE — Progress Notes (Signed)
? ? ? ?Chief Complaint:  ? ?OBESITY ?Rachel Woodard is here to discuss her progress with her obesity treatment plan along with follow-up of her obesity related diagnoses. Rachel Woodard is on the Category 3 Plan and keeping a food journal and adhering to recommended goals of 1350-1500 calories and 90 grams of protein and states she is following her eating plan approximately 90% of the time. Rachel Woodard states she is walking for 30-40 minutes 4-5 times per week. ? ?Today's visit was #: 9 ?Starting weight: 258 lbs ?Starting date: 03/14/2021 ?Today's weight: 246 lbs ?Today's date: 08/31/2021 ?Total lbs lost to date: 12 lbs ?Total lbs lost since last in-office visit: 0 ? ?Interim History: Rachel Woodard reports that if she stopped eating out with her friend, she would do better. She has increased her protein by adding protein shakes. When eating on plan she notes hunger prior to bedtime. She does not feel that her appetite is controlled.  ? ?Subjective:  ? ?1. Vitamin D deficiency ?Rachel Woodard is on Vitamin D weekly and she is tolerating it well. ? ?2. Prediabetes ?Rachel Woodard has a diagnosis of prediabetes based on her elevated HgA1c ( 6.0) and was informed this puts her at greater risk of developing diabetes. She continues to work on diet and exercise to decrease her risk of diabetes. She denies nausea or hypoglycemia. ? ?Assessment/Plan:  ? ?1. Vitamin D deficiency ?Low Vitamin D level contributes to fatigue and are associated with obesity, breast, and colon cancer. We will refill prescription Vitamin D 50,000 IU every week and Rachel Woodard will follow-up for routine testing of Vitamin D, at least 2-3 times per year to avoid over-replacement. ? ?- Vitamin D, Ergocalciferol, (DRISDOL) 1.25 MG (50000 UNIT) CAPS capsule; Take 1 capsule (50,000 Units total) by mouth every 7 (seven) days.  Dispense: 4 capsule; Refill: 0 ? ?2. Prediabetes ?Rachel Woodard will continue to work on weight loss, exercise, and decreasing simple carbohydrates to help decrease the risk of  diabetes.  ? ?- Semaglutide-Weight Management (WEGOVY) 0.25 MG/0.5ML SOAJ; Inject 0.25 mg into the skin once a week.  Dispense: 2 mL; Refill: 0 ? ?3. Obesity with current BMI of 39.72 ?Rachel Woodard is currently in the action stage of change. As such, her goal is to continue with weight loss efforts. She has agreed to keeping a food journal and adhering to recommended goals of 1350-1500 calories and 90 grams of protein daily.   ? ?Rachel Woodard agrees to start Wegovy 0.25 mg for 1 month with no refills.  ? ?Exercise goals:  As is. ? ?Behavioral modification strategies: decreasing eating out and meal planning and cooking strategies. ? ?Rachel Woodard has agreed to follow-up with our clinic in 3 weeks. She was informed of the importance of frequent follow-up visits to maximize her success with intensive lifestyle modifications for her multiple health conditions.  ? ?Objective:  ? ?Blood pressure 123/74, pulse 71, temperature 98 ?F (36.7 ?C), height '5\' 6"'$  (1.676 m), weight 246 lb (111.6 kg), SpO2 100 %. ?Body mass index is 39.71 kg/m?. ? ?General: Cooperative, alert, well developed, in no acute distress. ?HEENT: Conjunctivae and lids unremarkable. ?Cardiovascular: Regular rhythm.  ?Lungs: Normal work of breathing. ?Neurologic: No focal deficits.  ? ?Lab Results  ?Component Value Date  ? CREATININE 1.15 (H) 03/14/2021  ? BUN 20 03/14/2021  ? NA 139 03/14/2021  ? K 3.9 03/14/2021  ? CL 102 03/14/2021  ? CO2 22 03/14/2021  ? ?Lab Results  ?Component Value Date  ? ALT 15 03/14/2021  ? AST 17 03/14/2021  ?  ALKPHOS 94 03/14/2021  ? BILITOT 0.2 03/14/2021  ? ?Lab Results  ?Component Value Date  ? HGBA1C 6.0 (H) 03/14/2021  ? HGBA1C 5.7 (H) 11/11/2018  ? HGBA1C 5.8 (H) 04/24/2018  ? HGBA1C 5.7 (H) 01/22/2018  ? HGBA1C 5.9 07/11/2017  ? ?Lab Results  ?Component Value Date  ? INSULIN 14.4 03/14/2021  ? ?Lab Results  ?Component Value Date  ? TSH 3.320 03/14/2021  ? ?Lab Results  ?Component Value Date  ? CHOL 170 03/14/2021  ? HDL 53 03/14/2021  ?  [place] 99 03/14/2021  ? TRIG 99 03/14/2021  ? CHOLHDL 3.1 07/11/2017  ? ?Lab Results  ?Component Value Date  ? VD25OH 33.0 03/14/2021  ? VD25OH 27.7 (L) 04/24/2018  ? VD25OH 23.0 (L) 01/22/2018  ? ?Lab Results  ?Component Value Date  ? WBC 7.6 03/14/2021  ? HGB 13.3 03/14/2021  ? HCT 40.5 03/14/2021  ? MCV 88 03/14/2021  ? PLT 255 03/14/2021  ? ?No results found for: IRON, TIBC, FERRITIN ? ?Obesity Behavioral Intervention:  ? ?Approximately 15 minutes were spent on the discussion below. ? ?ASK: ?We discussed the diagnosis of obesity with Rachel Woodard today and Rachel Woodard agreed to give [place] permission to discuss obesity behavioral modification therapy today. ? ?ASSESS: ?Rachel Woodard has the diagnosis of obesity and her BMI today is 39.7. Rachel Woodard is in the action stage of change.  ? ?ADVISE: ?Rachel Woodard was educated on the multiple health risks of obesity as well as the benefit of weight loss to improve her health. She was advised of the need for long term treatment and the importance of lifestyle modifications to improve her current health and to decrease her risk of future health problems. ? ?AGREE: ?Multiple dietary modification options and treatment options were discussed and Rachel Woodard agreed to follow the recommendations documented in the above note. ? ?ARRANGE: ?Rachel Woodard was educated on the importance of frequent visits to treat obesity as outlined per CMS and USPSTF guidelines and agreed to schedule her next follow up appointment today. ? ?Attestation Statements:  ? ?Reviewed by clinician on day of visit: allergies, medications, problem list, medical history, surgical history, family history, social history, and previous encounter notes. ? ?I, Rachel Woodard, am acting as Location manager for [employer], PA-C. ? ?I have reviewed the above documentation for accuracy and completeness, and I agree with the above. Abby Potash, PA-C ? ?

## 2021-09-06 ENCOUNTER — Ambulatory Visit (INDEPENDENT_AMBULATORY_CARE_PROVIDER_SITE_OTHER): Payer: Medicare HMO | Admitting: Orthopaedic Surgery

## 2021-09-06 ENCOUNTER — Ambulatory Visit (INDEPENDENT_AMBULATORY_CARE_PROVIDER_SITE_OTHER): Payer: Medicare HMO

## 2021-09-06 ENCOUNTER — Other Ambulatory Visit: Payer: Self-pay

## 2021-09-06 VITALS — Ht 66.0 in | Wt 251.0 lb

## 2021-09-06 DIAGNOSIS — M79604 Pain in right leg: Secondary | ICD-10-CM

## 2021-09-06 MED ORDER — PREDNISONE 10 MG (21) PO TBPK: ORAL_TABLET | ORAL | 0 refills | Status: DC

## 2021-09-06 NOTE — Progress Notes (Signed)
? ?Office Visit Note ?  ?Patient: Rachel Woodard           ?Date of Birth: 1951-06-04           ?MRN: [id number] ?Visit Date: 09/06/2021 ?             ?Requested by: Cipriano Mile, NP ?[address] ?[place],  [place] [address] ?PCP: Cipriano Mile, NP ? ? ?Assessment & Plan: ?Visit Diagnoses:  ?1. Pain in right leg   ? ? ?Plan: Impression is right lower extremity lumbar radiculopathy.  I have discussed starting the patient on another steroid taper.  She will continue take the muscle relaxer.  I would also like to start her in physical therapy.  If her symptoms do not improve over the next several weeks she will call and we will get an MRI.  Follow-up with [place] as needed. ? ?Follow-Up Instructions: Return if symptoms worsen or fail to improve.  ? ?Orders:  ?Orders Placed This Encounter  ?Procedures  ? XR HIP UNILAT W OR W/O PELVIS 1V RIGHT  ? XR Lumbar Spine 2-3 Views  ? Ambulatory referral to Physical Therapy  ? ?Meds ordered this encounter  ?Medications  ? predniSONE (STERAPRED UNI-PAK 21 TAB) 10 MG (21) TBPK tablet  ?  Sig: Take as directed  ?  Dispense:  21 tablet  ?  Refill:  0  ? ? ? ? Procedures: ?No procedures performed ? ? ?Clinical Data: ?No additional findings. ? ? ?Subjective: ?Chief Complaint  ?Patient presents with  ? Right Leg - Pain  ? ? ?HPI patient is a pleasant 71 year old female who comes in today with right leg pain for the past 2 weeks.  No known injury or change in activity.  The pain started in the right buttock and radiates to the calf.  Pain is worse with sitting down or lying down.  She denies any weakness to the right lower extremity.  She does note tingling to the right lower extremity.  She tried a steroid pack last week which mildly helped.  Her PCP is recently started her on Robaxin.  She is also being tested for autoimmune disease as her daughter was recently diagnosed with lupus.  She denies any history of lumbar pathology. ? ?Review of Systems as detailed in HPI.  All other  reviewed and are negative. ? ? ?Objective: ?Vital Signs: Ht '5\' 6"'$  (1.676 m)   Wt 251 lb (113.9 kg)   BMI 40.51 kg/m?  ? ?Physical Exam well-developed and well-nourished female in no acute distress.  Alert and oriented x3. ? ?Ortho Exam right hip exam shows negative logroll negative FADIR.  Lumbar spine exams is no spinous tenderness.  She does have mild paraspinous tenderness on the right side.  Slight pain with lumbar extension.  No pain with lumbar flexion or rotation.  Negative straight leg raise.  She is neurovascular intact distally. ? ?Specialty Comments:  ?No specialty comments available. ? ?Imaging: ?XR HIP UNILAT W OR W/O PELVIS 1V RIGHT ? ?Result Date: 09/06/2021 ?Moderate degenerative changes to the hip joint ? ?XR Lumbar Spine 2-3 Views ? ?Result Date: 09/06/2021 ?Moderate degenerative changes L4-5 and L5-S1  ? ? ?PMFS History: ?Patient Active Problem List  ? Diagnosis Date Noted  ? Pain due to onychomycosis of toenails of both feet 01/20/2020  ? Shortness of breath 10/05/2019  ? Status post total left knee replacement 12/22/2018  ? MDD (major depressive disorder), recurrent episode, mild ([place]) 10/29/2018  ? Autoimmune hepatitis ([place]) 10/29/2018  ?  OSA (obstructive sleep apnea) 10/29/2018  ? Renal cyst, right 10/22/2018  ? Lung nodule, solitary 10/22/2018  ? Genital herpes simplex 10/29/2017  ? Vitamin D deficiency 10/29/2017  ? Class 3 severe obesity with serious comorbidity and body mass index (BMI) of 40.0 to 44.9 in adult [facility]) 10/29/2017  ? Essential hypertension 10/29/2017  ? Prediabetes 02/14/2017  ? Fibromyalgia 02/14/2017  ? Substance abuse in remission ([place]) 02/14/2017  ? Anxiety and depression 02/14/2017  ? HX: breast cancer 02/14/2017  ? Urge incontinence 11/13/2016  ? Unilateral primary osteoarthritis, left knee 06/14/2016  ? GERD (gastroesophageal reflux disease) 11/02/2015  ? Angioedema 10/24/2013  ? Tobacco abuse 10/24/2013  ? DCIS (ductal carcinoma in situ) of breast 04/07/2013  ?  Depression 04/07/2013  ? ?Past Medical History:  ?Diagnosis Date  ? Anemia 2001  ? after gastric bypass  ? Anxiety   ? Aortic atherosclerosis ([place])   ? Arthritis   ? Back pain   ? Bilateral swelling of feet   ? Bipolar 1 disorder ([place])   ? Breast cancer ([place]) 2013  ? Breast cancer    Radiation therapy  ? Calcified granuloma of lung ([place])   ? Cigarette nicotine dependence   ? Cigarette nicotine dependence in remission   ? Cocaine dependence in remission [facility])   ? COPD (chronic obstructive pulmonary disease) ([place])   ? Depression   ? Disequilibrium   ? Fibromyalgia   ? GERD (gastroesophageal reflux disease)   ? HSV infection   ? Hypertension   ? Hypertensive kidney disease with stage 3a chronic kidney disease ([place])   ? Hypertensive kidney disease with stage 3b chronic kidney disease ([place])   ? Osteoarthritis   ? Personal history of infectious disease   ? Personal history of radiation therapy 2013  ? Pneumonia   ? Pre-diabetes   ? PTSD (post-traumatic stress disorder)   ? Seasonal allergic rhinitis due to pollen   ? Sleep apnea   ? uses CPAP  ? SOB (shortness of breath)   ? Vitamin D deficiency   ?  ?Family History  ?Problem Relation Age of Onset  ? Leukemia Mother   ? Prostate cancer Father   ?     stage IV  ? Liver disease Brother   ? Diabetes Other   ? Cancer Other   ? Migraines Neg Hx   ? Headache Neg Hx   ?  ?Past Surgical History:  ?Procedure Laterality Date  ? ABDOMINAL HYSTERECTOMY  2018  ? BREAST LUMPECTOMY Right 2013  ? GASTRIC BYPASS  2001  ? TOTAL KNEE ARTHROPLASTY Left 12/22/2018  ? Procedure: LEFT TOTAL KNEE ARTHROPLASTY;  Surgeon: Leandrew Koyanagi, MD;  Location: [facility];  Service: Orthopedics;  Laterality: Left;  ? ?Social History  ? ?Occupational History  ? Occupation: Retired  ?Tobacco Use  ? Smoking status: Every Day  ?  Packs/day: 0.50  ?  Years: 20.00  ?  Pack years: 10.00  ?  Types: Cigarettes  ?  Start date: 06/26/1999  ? Smokeless tobacco: Never  ? Tobacco comments:  ?  trying to quit  ?Vaping Use  ?  Vaping Use: Never used  ?Substance and Sexual Activity  ? Alcohol use: Not Currently  ?  Comment: seldom  ? Drug use: No  ?  Types: Cocaine  ?  Comment: clean x 60 days on 11/28/15  ? Sexual activity: Yes  ? ? ? ? ? ? ?

## 2021-09-22 ENCOUNTER — Other Ambulatory Visit: Payer: Self-pay

## 2021-09-22 ENCOUNTER — Ambulatory Visit: Payer: Medicare HMO | Attending: Physician Assistant

## 2021-09-22 DIAGNOSIS — M79604 Pain in right leg: Secondary | ICD-10-CM | POA: Diagnosis not present

## 2021-09-22 DIAGNOSIS — M5459 Other low back pain: Secondary | ICD-10-CM | POA: Diagnosis present

## 2021-09-22 DIAGNOSIS — M6281 Muscle weakness (generalized): Secondary | ICD-10-CM | POA: Diagnosis present

## 2021-09-22 DIAGNOSIS — R262 Difficulty in walking, not elsewhere classified: Secondary | ICD-10-CM | POA: Insufficient documentation

## 2021-09-22 NOTE — Therapy (Signed)
?OUTPATIENT PHYSICAL THERAPY THORACOLUMBAR EVALUATION ? ? ?Patient Name: Rachel Woodard ?MRN: [id number] ?DOB:30-Sep-1950, 71 y.o., female ?Today's Date: 09/22/2021 ? ? PT End of Session - 09/22/21 1132   ? ? Visit Number 1   ? Number of Visits 17   ? Date for PT Re-Evaluation 11/17/21   ? Authorization Type [employer] MCR   ? Authorization Time Period FOTO v6, v10, KX mod v15   ? Progress Note Due on Visit 10   ? PT Start Time 1045   ? PT Stop Time 1135   ? PT Time Calculation (min) 50 min   ? Activity Tolerance Patient tolerated treatment well   ? Behavior During Therapy [facility] for tasks assessed/performed   ? ?  ?  ? ?  ? ? ?Past Medical History:  ?Diagnosis Date  ? Anemia 2001  ? after gastric bypass  ? Anxiety   ? Aortic atherosclerosis ([place])   ? Arthritis   ? Back pain   ? Bilateral swelling of feet   ? Bipolar 1 disorder ([place])   ? Breast cancer ([place]) 2013  ? Breast cancer    Radiation therapy  ? Calcified granuloma of lung ([place])   ? Cigarette nicotine dependence   ? Cigarette nicotine dependence in remission   ? Cocaine dependence in remission [facility])   ? COPD (chronic obstructive pulmonary disease) ([place])   ? Depression   ? Disequilibrium   ? Fibromyalgia   ? GERD (gastroesophageal reflux disease)   ? HSV infection   ? Hypertension   ? Hypertensive kidney disease with stage 3a chronic kidney disease ([place])   ? Hypertensive kidney disease with stage 3b chronic kidney disease ([place])   ? Osteoarthritis   ? Personal history of infectious disease   ? Personal history of radiation therapy 2013  ? Pneumonia   ? Pre-diabetes   ? PTSD (post-traumatic stress disorder)   ? Seasonal allergic rhinitis due to pollen   ? Sleep apnea   ? uses CPAP  ? SOB (shortness of breath)   ? Vitamin D deficiency   ? ?Past Surgical History:  ?Procedure Laterality Date  ? ABDOMINAL HYSTERECTOMY  2018  ? BREAST LUMPECTOMY Right 2013  ? GASTRIC BYPASS  2001  ? TOTAL KNEE ARTHROPLASTY Left 12/22/2018  ? Procedure: LEFT TOTAL KNEE ARTHROPLASTY;   Surgeon: Leandrew Koyanagi, MD;  Location: [facility];  Service: Orthopedics;  Laterality: Left;  ? ?Patient Active Problem List  ? Diagnosis Date Noted  ? Pain due to onychomycosis of toenails of both feet 01/20/2020  ? Shortness of breath 10/05/2019  ? Status post total left knee replacement 12/22/2018  ? MDD (major depressive disorder), recurrent episode, mild ([place]) 10/29/2018  ? Autoimmune hepatitis ([place]) 10/29/2018  ? OSA (obstructive sleep apnea) 10/29/2018  ? Renal cyst, right 10/22/2018  ? Lung nodule, solitary 10/22/2018  ? Genital herpes simplex 10/29/2017  ? Vitamin D deficiency 10/29/2017  ? Class 3 severe obesity with serious comorbidity and body mass index (BMI) of 40.0 to 44.9 in adult [facility]) 10/29/2017  ? Essential hypertension 10/29/2017  ? Prediabetes 02/14/2017  ? Fibromyalgia 02/14/2017  ? Substance abuse in remission ([place]) 02/14/2017  ? Anxiety and depression 02/14/2017  ? HX: breast cancer 02/14/2017  ? Urge incontinence 11/13/2016  ? Unilateral primary osteoarthritis, left knee 06/14/2016  ? GERD (gastroesophageal reflux disease) 11/02/2015  ? Angioedema 10/24/2013  ? Tobacco abuse 10/24/2013  ? DCIS (ductal carcinoma in situ) of breast 04/07/2013  ? Depression 04/07/2013  ? ? ?  PCP: Cipriano Mile, NP ? ?REFERRING PROVIDER: Aundra Dubin, PA-C ? ?REFERRING DIAG: M79.604 (ICD-10-CM) - Pain in right leg ? ?THERAPY DIAG:  ?Pain in right leg ? ?Difficulty in walking, not elsewhere classified ? ?Muscle weakness (generalized) ? ?Other low back pain ? ?ONSET DATE: 1 month ago ? ?SUBJECTIVE:                                                                                                                                                                                          ? ?SUBJECTIVE STATEMENT: ?Pt reports new onset of Rt leg pain and N/T which started about 1 month ago after waking up with intense LBP. She denies any change in activity or injury leading up to this onset of pain. Her LE pain and N/T starts  at the buttocks and can travel down to the ankle and back to the popliteal fossa. Since that time, the pt's primary problems are in the Rt LE, although she does continue to have Rt sided LBP. The pt reports she can have Rt LE sxs without LBP. Aggravating factors include standing > 5-10 minutes. Easing factors include IcyHot, muscle creams, and rest. Current pain is 0/10. Worst pain is 10/10. Pt denies any unexplained weight change, changes in bowel/ bladder function, unrelenting night pain, or nausea/ vomiting.  ? ?PERTINENT HISTORY:  ?Anxiety, depression, Bipolar 1 disorder, fibromyalgia, HTN, pre-diabetes, COPD, hx of breast cx, aortic atherosclerosis, arthritis ? ?PAIN:  ?Are you having pain? Yes: NPRS scale: 0/10 ?Pain location: Rt low back, Rt buttocks, Rt lateral thigh, Rt posterior calf ?Pain description: achy, N/T ?Aggravating factors: standing > 5-10 minutes ?Relieving factors: IcyHot, muscle creams, and rest ? ? ?PRECAUTIONS: None ? ?WEIGHT BEARING RESTRICTIONS No ? ?FALLS:  ?Has patient fallen in last 6 months? No ? ?LIVING ENVIRONMENT: ?Lives with: lives alone ?Lives in: House/apartment ?Stairs: Yes: External: 16 steps steps; on right going up, on left going up, and can reach both ?Has following equipment at home: Single point cane, Walker - 4 wheeled, and Grab bars ? ?OCCUPATION: Retired ? ?PLOF: Independent ? ?PATIENT GOALS Walking for exercises, decrease weight, do ADLs ? ?Screening for Suicide ? ?Answer the following questions with Yes or No and place an "x" beside the action taken. ? ?1. Over the past two weeks, have you felt down, depressed, or hopeless?   No ? ?2. Within the past two weeks, have you felt little interest or pleasure in life?  No ? ?If YES to either #1 or #2, then ask #3 ? ?3. Have you had thoughts that that life is not worth living or that you might be   ?  better off dead?    ? ?If answer is NO and suspicion is low, then end ? ? ?4. Over this past week, have you had any thoughts  about hurting or even killing yourself?   ? ?If NO, then end. Patient in no immediate danger ? ? ?5. If so, do you believe that you intend to or will harm yourself?   ? ?   If NO, then end. Patient in no immediate danger ? ? ?6.  Do you have a plan as to how you would hurt yourself?   ? ? ?7.  Over this past week, have you actually done anything to hurt yourself?  ? ? ?IF YES answers to either #4, #5, #6 or #7, then patient is AT RISK for suicide ? ? ?Actions Taken ? ?__X__  Screening negative; no further action required ? ?____  Screening positive; no immediate danger and patient already in treatment with a ? mental health provider. Advise patient to speak to their mental health provider. ? ?____  Screening positive; no immediate danger. Patient advised to contact a mental ? health provider for further assessment.  ? ?____  Screening positive; in immediate danger as patient states intention of killing self, ? has plan and a sense of imminence. Do not leave alone. Seek permission from ? patient to contact a family member to inform them. Direct patient to go to ED.  ? ?OBJECTIVE:  ? ?DIAGNOSTIC FINDINGS:  ?09/06/2021: XR Lumbar spine 2-3 views: Moderate degenerative changes L4-5 and L5-S1 ?09/06/2021: XR Hip unilat w/ or w/o pelvis Right: Moderate degenerative changes to the hip joint ?04/20/2021: XR Knee 3 view Left:  X-rays reveal well-seated prosthesis without complication ?10/272022: XR Knee 3 view Right: Significant degenerative changes to the medial and patellofemoral  ?compartments ? ?PATIENT SURVEYS:  ?FOTO 58%, predicted 64% in 13 visits ? ?SCREENING FOR RED FLAGS: ?Bowel or bladder incontinence: No ?Cauda equina syndrome: No ? ?COGNITION: ? Overall cognitive status: Within functional limits for tasks assessed   ?  ?SENSATION: ?Light touch: WFL ? ?MUSCLE LENGTH: ?Hamstrings: WNL BIL ?Thomas test: (-) BIL ?Ober's test; (+) on Rt, (-) on Lt ? ?POSTURE:  ?Decreased lumbar lordosis ? ?PALPATION: ?TTP to Rt  piriformis, glute max, BIL lumbar paraspinals/ QL at L3-L5, Exquisite TTP to Rt greater trochanter ? ?PASSIVE ACCESSORIES: ?Hypomobile and painful L3-S1 ? ?LUMBAR ROM:  ? ?Active  A/PROM  ?09/22/2021  ?Flexion 8

## 2021-09-25 ENCOUNTER — Ambulatory Visit (INDEPENDENT_AMBULATORY_CARE_PROVIDER_SITE_OTHER): Payer: Medicare HMO | Admitting: Physician Assistant

## 2021-09-25 ENCOUNTER — Encounter (INDEPENDENT_AMBULATORY_CARE_PROVIDER_SITE_OTHER): Payer: Self-pay

## 2021-09-27 ENCOUNTER — Telehealth (INDEPENDENT_AMBULATORY_CARE_PROVIDER_SITE_OTHER): Payer: Self-pay | Admitting: Physician Assistant

## 2021-09-27 ENCOUNTER — Ambulatory Visit
Admission: RE | Admit: 2021-09-27 | Discharge: 2021-09-27 | Disposition: A | Discharge status: Home / Self Care | Payer: Medicare HMO | Source: Ambulatory Visit | Attending: Student | Admitting: Student

## 2021-09-27 ENCOUNTER — Other Ambulatory Visit: Payer: Self-pay | Admitting: Internal Medicine

## 2021-09-27 ENCOUNTER — Encounter (INDEPENDENT_AMBULATORY_CARE_PROVIDER_SITE_OTHER): Payer: Self-pay

## 2021-09-27 DIAGNOSIS — Z1231 Encounter for screening mammogram for malignant neoplasm of breast: Secondary | ICD-10-CM

## 2021-09-27 NOTE — Telephone Encounter (Signed)
Prior authorization denied for [facility]. Per [employer] - weight loss medication not covered/ Medicaid - not covered. Patient sent denial message via mychart.  ?

## 2021-09-28 ENCOUNTER — Telehealth: Payer: Self-pay | Admitting: Orthopaedic Surgery

## 2021-09-28 NOTE — Telephone Encounter (Signed)
Received call from pt stating [facility] have not received her records. I advised records faxed 3/23. And I will and did  re-fax [phone] ?

## 2021-09-29 ENCOUNTER — Other Ambulatory Visit: Payer: Self-pay | Admitting: Internal Medicine

## 2021-09-29 DIAGNOSIS — R928 Other abnormal and inconclusive findings on diagnostic imaging of breast: Secondary | ICD-10-CM

## 2021-10-04 ENCOUNTER — Ambulatory Visit: Payer: Medicare HMO | Attending: Physician Assistant

## 2021-10-04 DIAGNOSIS — M6281 Muscle weakness (generalized): Secondary | ICD-10-CM | POA: Diagnosis present

## 2021-10-04 DIAGNOSIS — R262 Difficulty in walking, not elsewhere classified: Secondary | ICD-10-CM | POA: Diagnosis present

## 2021-10-04 DIAGNOSIS — M5459 Other low back pain: Secondary | ICD-10-CM | POA: Diagnosis present

## 2021-10-04 DIAGNOSIS — M79604 Pain in right leg: Secondary | ICD-10-CM | POA: Diagnosis present

## 2021-10-04 NOTE — Therapy (Signed)
?OUTPATIENT PHYSICAL THERAPY TREATMENT NOTE ? ? ?Patient Name: Rachel Woodard ?MRN: [id number] ?DOB:[birth year], 71 y.o., female ?Today's Date: 10/04/2021 ? ?PCP: Scheryl Marten, PA ?REFERRING PROVIDER: Aundra Dubin, PA-C ? ?END OF SESSION:  ? PT End of Session - 10/04/21 1132   ? ? Visit Number 2   ? Number of Visits 17   ? Date for PT Re-Evaluation 11/17/21   ? Authorization Type [employer] MCR   ? Authorization Time Period FOTO v6, v10, KX mod v15   ? Progress Note Due on Visit 10   ? PT Start Time 1130   ? PT Stop Time 1210   ? PT Time Calculation (min) 40 min   ? Activity Tolerance Patient tolerated treatment well   ? Behavior During Therapy [facility] for tasks assessed/performed   ? ?  ?  ? ?  ? ? ?Past Medical History:  ?Diagnosis Date  ? Anemia 2001  ? after gastric bypass  ? Anxiety   ? Aortic atherosclerosis ([place])   ? Arthritis   ? Back pain   ? Bilateral swelling of feet   ? Bipolar 1 disorder ([place])   ? Breast cancer ([place]) 2013  ? Breast cancer    Radiation therapy  ? Calcified granuloma of lung ([place])   ? Cigarette nicotine dependence   ? Cigarette nicotine dependence in remission   ? Cocaine dependence in remission [facility])   ? COPD (chronic obstructive pulmonary disease) ([place])   ? Depression   ? Disequilibrium   ? Fibromyalgia   ? GERD (gastroesophageal reflux disease)   ? HSV infection   ? Hypertension   ? Hypertensive kidney disease with stage 3a chronic kidney disease ([place])   ? Hypertensive kidney disease with stage 3b chronic kidney disease ([place])   ? Osteoarthritis   ? Personal history of infectious disease   ? Personal history of radiation therapy 2013  ? Pneumonia   ? Pre-diabetes   ? PTSD (post-traumatic stress disorder)   ? Seasonal allergic rhinitis due to pollen   ? Sleep apnea   ? uses CPAP  ? SOB (shortness of breath)   ? Vitamin D deficiency   ? ?Past Surgical History:  ?Procedure Laterality Date  ? ABDOMINAL HYSTERECTOMY  2018  ? BREAST LUMPECTOMY Right 2013  ? GASTRIC BYPASS  2001  ?  TOTAL KNEE ARTHROPLASTY Left 12/22/2018  ? Procedure: LEFT TOTAL KNEE ARTHROPLASTY;  Surgeon: Leandrew Koyanagi, MD;  Location: [facility];  Service: Orthopedics;  Laterality: Left;  ? ?Patient Active Problem List  ? Diagnosis Date Noted  ? Pain due to onychomycosis of toenails of both feet 01/20/2020  ? Shortness of breath 10/05/2019  ? Status post total left knee replacement 12/22/2018  ? MDD (major depressive disorder), recurrent episode, mild ([place]) 10/29/2018  ? Autoimmune hepatitis ([place]) 10/29/2018  ? OSA (obstructive sleep apnea) 10/29/2018  ? Renal cyst, right 10/22/2018  ? Lung nodule, solitary 10/22/2018  ? Genital herpes simplex 10/29/2017  ? Vitamin D deficiency 10/29/2017  ? Class 3 severe obesity with serious comorbidity and body mass index (BMI) of 40.0 to 44.9 in adult [facility]) 10/29/2017  ? Essential hypertension 10/29/2017  ? Prediabetes 02/14/2017  ? Fibromyalgia 02/14/2017  ? Substance abuse in remission ([place]) 02/14/2017  ? Anxiety and depression 02/14/2017  ? HX: breast cancer 02/14/2017  ? Urge incontinence 11/13/2016  ? Unilateral primary osteoarthritis, left knee 06/14/2016  ? GERD (gastroesophageal reflux disease) 11/02/2015  ? Angioedema 10/24/2013  ? Tobacco abuse 10/24/2013  ?  DCIS (ductal carcinoma in situ) of breast 04/07/2013  ? Depression 04/07/2013  ? ? ?REFERRING DIAG: M79.604 (ICD-10-CM) - Pain in right leg ? ?THERAPY DIAG:  ?Pain in right leg ? ?Difficulty in walking, not elsewhere classified ? ?Muscle weakness (generalized) ? ?Other low back pain ? ?PERTINENT HISTORY: Anxiety, depression, Bipolar 1 disorder, fibromyalgia, HTN, pre-diabetes, COPD, hx of breast cx, aortic atherosclerosis, arthritis ? ?PRECAUTIONS: None ? ?ONSET DATE: 1 month ago ? ?SUBJECTIVE: Patient reports HEP compliance and that her pain is more "stiffness." ? ?PAIN:  ?Are you having pain? Yes:  ?NPRS scale: 4/10 ?Pain location: Rt low back, Rt buttocks, Rt lateral thigh, Rt posterior calf ?Pain description: achy,  N/T ?Aggravating factors: standing > 5-10 minutes ?Relieving factors: IcyHot, muscle creams, and rest ? ? ?OBJECTIVE:  ?  ?DIAGNOSTIC FINDINGS:  ?09/06/2021: XR Lumbar spine 2-3 views: Moderate degenerative changes L4-5 and L5-S1 ?09/06/2021: XR Hip unilat w/ or w/o pelvis Right: Moderate degenerative changes to the hip joint ?04/20/2021: XR Knee 3 view Left:  X-rays reveal well-seated prosthesis without complication ?10/272022: XR Knee 3 view Right: Significant degenerative changes to the medial and patellofemoral  ?compartments ?  ?PATIENT SURVEYS:  ?FOTO 58%, predicted 64% in 13 visits ?  ?SCREENING FOR RED FLAGS: ?Bowel or bladder incontinence: No ?Cauda equina syndrome: No ?  ?COGNITION: ?          Overall cognitive status: Within functional limits for tasks assessed               ?           ?SENSATION: ?Light touch: WFL ?  ?MUSCLE LENGTH: ?Hamstrings: WNL BIL ?Thomas test: (-) BIL ?Ober's test; (+) on Rt, (-) on Lt ?  ?POSTURE:  ?Decreased lumbar lordosis ?  ?PALPATION: ?TTP to Rt piriformis, glute max, BIL lumbar paraspinals/ QL at L3-L5, Exquisite TTP to Rt greater trochanter ?  ?PASSIVE ACCESSORIES: ?Hypomobile and painful L3-S1 ?  ?LUMBAR ROM:  ?  ?Active  A/PROM  ?09/22/2021  ?Flexion 80  ?Extension 20  ?Right lateral flexion To knee  ?Left lateral flexion To knee  ?Right rotation 80  ?Left rotation 80  ? (Blank rows = not tested) ?  ?  ?LE MMT: ?  ?MMT Right ?09/22/2021 Left ?09/22/2021  ?Hip flexion 4+/5 5/5  ?Hip extension 3+/5 4/5  ?Hip abduction 3+/5 4/5  ?Hip adduction 4/5 4/5  ?Hip internal rotation 4+/5 5/5  ?Hip external rotation 4+/5 5/5  ?Knee flexion 5/5 5/5  ?Knee extension 5/5 5/5  ?Ankle dorsiflexion 5/5 5/5  ?Ankle plantarflexion 5/5 5/5  ?Ankle inversion 5/5 5/5  ?Ankle eversion 5/5 5/5  ? (Blank rows = not tested) ?  ?LUMBAR SPECIAL TESTS:  ?Slump: (-) ?SLR: (-) ?PLE: (-) ?Gaenslen's: (-) ?Pelvic compression: (+) ?Pelvic distraction: (-) ?Thigh thrust: (-) ?FABER: (-) ?FADIR: (-) ?Piriformis  test: (+) on Rt ?Trendelenburg sign: (+) BIL ?  ?  ?FUNCTIONAL TESTS:  ?5xSTS: 12.45 seconds ?TUG: 14 seconds ?Squat: x3 WNL ?Plank: 11 seconds ?  ?GAIT: ?Distance walked: 41f ?Assistive device utilized: None ?Level of assistance: Complete [place] ?Comments: Increased BIL hip adduction, increased lateral pelvic motion, decreased stride length ?  ?  ?  ?TODAY'S TREATMENT  [facility]  DATE: 10/04/2021 ?Therapeutic Exercise: ?Nustep level 5 x 5 mins ?Standing hip flexion/abduction/extension 2x10 BIL ?Mini squats 2x10 ?Seated hip adduction ball squeeze with hip IR against RTB 5" hold 2x10 ?Supine clamshells GTB 2x15 ?Bridge with GTB 5" hold at top 2x10 ?Sidelying hip abduction Rt 2x10 ?STS x10 holding 7# dumbbell ?Supine hamstring stretch with strap 2x30" BIL ?Supine ITB stretch with strap 2x30" BIL ?Supine figure 4 piriformis stretch 2x30" BIL ? ? ?09/22/2021 Demonstrated and issued HEP ?  ?  ?PATIENT EDUCATION:  ?Education details: Pt educated on probable underlying pathophysiology behind her pain presentation, POC, prognosis, FOTO, and HEP ?Person educated: Patient ?Education method: Explanation, Demonstration, and Handouts ?Education comprehension: verbalized understanding and returned demonstration ?  ?  ?HOME EXERCISE PROGRAM: ?Access Code: [email or web address] ?URL: [email or web address] ?Date: 09/22/2021 ?Prepared by: Vanessa  ?  ?Exercises ?- Seated Slump Nerve Glide  - 1 x daily - 7 x weekly - 3 sets - 20 reps ?- Plank on Knees  - 1 x daily - 7 x weekly - 3 sets - 10 reps ?- Supine Piriformis Stretch with Foot on Ground  - 1 x daily - 7 x weekly - 2 sets - 1-min hold ?- Standing ITB Stretch  - 1 x daily - 7 x weekly - 2 sets - 1-min hold ?- Supine Bridge with Resistance Band  - 1 x daily - 7 x weekly - 3 sets - 10 reps - 3-sec hold ?  ?ASSESSMENT: ?  ?CLINICAL IMPRESSION: ?Patient presents to PT with moderate pain in her R hip and R  shoulder and reports HEP compliance. Session today focused on LE strengthening and stretching. Patient was able to tolerate all prescribed exercises with no adverse effects. Patient continues to benefit from skilled

## 2021-10-06 ENCOUNTER — Ambulatory Visit: Payer: Medicare HMO | Admitting: Physical Therapy

## 2021-10-10 ENCOUNTER — Ambulatory Visit: Payer: Medicare HMO

## 2021-10-10 DIAGNOSIS — M79604 Pain in right leg: Secondary | ICD-10-CM | POA: Diagnosis not present

## 2021-10-10 DIAGNOSIS — M5459 Other low back pain: Secondary | ICD-10-CM

## 2021-10-10 DIAGNOSIS — R262 Difficulty in walking, not elsewhere classified: Secondary | ICD-10-CM

## 2021-10-10 DIAGNOSIS — M6281 Muscle weakness (generalized): Secondary | ICD-10-CM

## 2021-10-10 NOTE — Therapy (Signed)
?OUTPATIENT PHYSICAL THERAPY TREATMENT NOTE ? ? ?Patient Name: Rachel Woodard ?MRN: [id number] ?DOB:Feb 26, 1951, 71 y.o., female ?Today's Date: 10/10/2021 ? ?PCP: Scheryl Marten, PA ?REFERRING PROVIDER: Cipriano Mile, NP ? ?END OF SESSION:  ? PT End of Session - 10/10/21 1143   ? ? Visit Number 3   ? Number of Visits 17   ? Date for PT Re-Evaluation 11/17/21   ? Authorization Type [employer] MCR   ? Authorization Time Period FOTO v6, v10, KX mod v15   ? Progress Note Due on Visit 10   ? PT Start Time 1130   ? PT Stop Time 1448   ? PT Time Calculation (min) 45 min   ? Activity Tolerance Patient tolerated treatment well   ? Behavior During Therapy [facility] for tasks assessed/performed   ? ?  ?  ? ?  ? ? ? ?Past Medical History:  ?Diagnosis Date  ? Anemia 2001  ? after gastric bypass  ? Anxiety   ? Aortic atherosclerosis ([place])   ? Arthritis   ? Back pain   ? Bilateral swelling of feet   ? Bipolar 1 disorder ([place])   ? Breast cancer ([place]) 2013  ? Breast cancer    Radiation therapy  ? Calcified granuloma of lung ([place])   ? Cigarette nicotine dependence   ? Cigarette nicotine dependence in remission   ? Cocaine dependence in remission [facility])   ? COPD (chronic obstructive pulmonary disease) ([place])   ? Depression   ? Disequilibrium   ? Fibromyalgia   ? GERD (gastroesophageal reflux disease)   ? HSV infection   ? Hypertension   ? Hypertensive kidney disease with stage 3a chronic kidney disease ([place])   ? Hypertensive kidney disease with stage 3b chronic kidney disease ([place])   ? Osteoarthritis   ? Personal history of infectious disease   ? Personal history of radiation therapy 2013  ? Pneumonia   ? Pre-diabetes   ? PTSD (post-traumatic stress disorder)   ? Seasonal allergic rhinitis due to pollen   ? Sleep apnea   ? uses CPAP  ? SOB (shortness of breath)   ? Vitamin D deficiency   ? ?Past Surgical History:  ?Procedure Laterality Date  ? ABDOMINAL HYSTERECTOMY  2018  ? BREAST LUMPECTOMY Right 2013  ? GASTRIC BYPASS  2001  ? TOTAL  KNEE ARTHROPLASTY Left 12/22/2018  ? Procedure: LEFT TOTAL KNEE ARTHROPLASTY;  Surgeon: Leandrew Koyanagi, MD;  Location: [place];  Service: Orthopedics;  Laterality: Left;  ? ?Patient Active Problem List  ? Diagnosis Date Noted  ? Pain due to onychomycosis of toenails of both feet 01/20/2020  ? Shortness of breath 10/05/2019  ? Status post total left knee replacement 12/22/2018  ? MDD (major depressive disorder), recurrent episode, mild ([place]) 10/29/2018  ? Autoimmune hepatitis ([place]) 10/29/2018  ? OSA (obstructive sleep apnea) 10/29/2018  ? Renal cyst, right 10/22/2018  ? Lung nodule, solitary 10/22/2018  ? Genital herpes simplex 10/29/2017  ? Vitamin D deficiency 10/29/2017  ? Class 3 severe obesity with serious comorbidity and body mass index (BMI) of 40.0 to 44.9 in adult [facility]) 10/29/2017  ? Essential hypertension 10/29/2017  ? Prediabetes 02/14/2017  ? Fibromyalgia 02/14/2017  ? Substance abuse in remission ([place]) 02/14/2017  ? Anxiety and depression 02/14/2017  ? HX: breast cancer 02/14/2017  ? Urge incontinence 11/13/2016  ? Unilateral primary osteoarthritis, left knee 06/14/2016  ? GERD (gastroesophageal reflux disease) 11/02/2015  ? Angioedema 10/24/2013  ? Tobacco abuse 10/24/2013  ?  DCIS (ductal carcinoma in situ) of breast 04/07/2013  ? Depression 04/07/2013  ? ? ?REFERRING DIAG: M79.604 (ICD-10-CM) - Pain in right leg ? ?THERAPY DIAG:  ?Pain in right leg ? ?Difficulty in walking, not elsewhere classified ? ?Muscle weakness (generalized) ? ?Other low back pain ? ?PERTINENT HISTORY: Anxiety, depression, Bipolar 1 disorder, fibromyalgia, HTN, pre-diabetes, COPD, hx of breast cx, aortic atherosclerosis, arthritis ? ?PRECAUTIONS: None ? ?ONSET DATE: 1 month ago ? ?SUBJECTIVE: Pt reports continued low-level Lt shoulder and Rt hip pain today, rated 1/10. She reports that she has been doing independent walking for about one mile the past couple of weeks. She reports that a Lt ankle soft brace has helped with her sense  of stability with walking, and she hasn't needed her cane for her walks recently. She reports an overall improvement since starting PT. ? ?PAIN:  ?Are you having pain? Yes:  ?NPRS scale: 1/10 ?Pain location: Rt hip, Lt shoulder ?Pain description: achy, N/T ?Aggravating factors: standing > 5-10 minutes ?Relieving factors: IcyHot, muscle creams, and rest ? ? ?OBJECTIVE:  ? *Unless otherwise noted, objective measures collected previously* ? ?DIAGNOSTIC FINDINGS:  ?09/06/2021: XR Lumbar spine 2-3 views: Moderate degenerative changes L4-5 and L5-S1 ?09/06/2021: XR Hip unilat w/ or w/o pelvis Right: Moderate degenerative changes to the hip joint ?04/20/2021: XR Knee 3 view Left:  X-rays reveal well-seated prosthesis without complication ?10/272022: XR Knee 3 view Right: Significant degenerative changes to the medial and patellofemoral  ?compartments ?  ?PATIENT SURVEYS:  ?FOTO 58%, predicted 64% in 13 visits ?  ?SCREENING FOR RED FLAGS: ?Bowel or bladder incontinence: No ?Cauda equina syndrome: No ?  ?COGNITION: ?          Overall cognitive status: Within functional limits for tasks assessed               ?           ?SENSATION: ?Light touch: WFL ?  ?MUSCLE LENGTH: ?Hamstrings: WNL BIL ?Thomas test: (-) BIL ?Ober's test; (+) on Rt, (-) on Lt ?  ?POSTURE:  ?Decreased lumbar lordosis ?  ?PALPATION: ?TTP to Rt piriformis, glute max, BIL lumbar paraspinals/ QL at L3-L5, Exquisite TTP to Rt greater trochanter ?  ?PASSIVE ACCESSORIES: ?Hypomobile and painful L3-S1 ?  ?LUMBAR ROM:  ?  ?Active  A/PROM  ?09/22/2021  ?Flexion 80  ?Extension 20  ?Right lateral flexion To knee  ?Left lateral flexion To knee  ?Right rotation 80  ?Left rotation 80  ? (Blank rows = not tested) ?  ?  ?LE MMT: ?  ?MMT Right ?09/22/2021 Left ?09/22/2021  ?Hip flexion 4+/5 5/5  ?Hip extension 3+/5 4/5  ?Hip abduction 3+/5 4/5  ?Hip adduction 4/5 4/5  ?Hip internal rotation 4+/5 5/5  ?Hip external rotation 4+/5 5/5  ?Knee flexion 5/5 5/5  ?Knee extension 5/5 5/5   ?Ankle dorsiflexion 5/5 5/5  ?Ankle plantarflexion 5/5 5/5  ?Ankle inversion 5/5 5/5  ?Ankle eversion 5/5 5/5  ? (Blank rows = not tested) ?  ?LUMBAR SPECIAL TESTS:  ?Slump: (-) ?SLR: (-) ?PLE: (-) ?Gaenslen's: (-) ?Pelvic compression: (+) ?Pelvic distraction: (-) ?Thigh thrust: (-) ?FABER: (-) ?FADIR: (-) ?Piriformis test: (+) on Rt ?Trendelenburg sign: (+) BIL ?  ?  ?FUNCTIONAL TESTS:  ?5xSTS: 12.45 seconds ?TUG: 14 seconds ?Squat: x3 WNL ?Plank: 11 seconds ?  ?GAIT: ?Distance walked: 91f ?Assistive device utilized: None ?Level of assistance: Complete Independence ?Comments: Increased BIL hip adduction, increased lateral pelvic motion, decreased stride length ?  ?  ?  ?  TODAY'S TREATMENT  ? ?[facility] Adult PT Treatment:                                                DATE: 10/10/2021 ?Therapeutic Exercise: ?Supine 90/90 abdominal isometric with handhold resistance 4x30sec ?Side knee planks 2x10 with 3-sec hold BIL ?Mini-squat side-step walkouts with 10# waist attachment at [facility] machine x5 BIL ?Standing Pallof press with 7# cable 2x8 with 5-sec hold BIL ?Standing open book stretch 2x10 BIL ?Standing abdominal press-down with 23# cable 2x10 ?Manual Therapy: ?N/A ?Neuromuscular re-ed: ?N/A ?Therapeutic Activity: ?N/A ?Modalities: ?N/A ?Self Care: ?N/A ? ? ?[place] Adult PT Treatment:                                                DATE: 10/04/2021 ?Therapeutic Exercise: ?Nustep level 5 x 5 mins ?Standing hip flexion/abduction/extension 2x10 BIL ?Mini squats 2x10 ?Seated hip adduction ball squeeze with hip IR against RTB 5" hold 2x10 ?Supine clamshells GTB 2x15 ?Bridge with GTB 5" hold at top 2x10 ?Sidelying hip abduction Rt 2x10 ?STS x10 holding 7# dumbbell ?Supine hamstring stretch with strap 2x30" BIL ?Supine ITB stretch with strap 2x30" BIL ?Supine figure 4 piriformis stretch 2x30" BIL ? ?  ?  ?PATIENT EDUCATION:  ?Education details: Pt educated on probable underlying pathophysiology behind her pain presentation, POC,  prognosis, FOTO, and HEP ?Person educated: Patient ?Education method: Explanation, Demonstration, and Handouts ?Education comprehension: verbalized understanding and returned demonstration ?  ?  ?HOME

## 2021-10-11 ENCOUNTER — Ambulatory Visit (INDEPENDENT_AMBULATORY_CARE_PROVIDER_SITE_OTHER): Payer: Medicare HMO | Admitting: Nurse Practitioner

## 2021-10-11 ENCOUNTER — Encounter (INDEPENDENT_AMBULATORY_CARE_PROVIDER_SITE_OTHER): Payer: Self-pay | Admitting: Nurse Practitioner

## 2021-10-11 VITALS — BP 110/72 | HR 70 | Temp 97.9°F | Ht 66.0 in | Wt 246.0 lb

## 2021-10-11 DIAGNOSIS — Z6839 Body mass index (BMI) 39.0-39.9, adult: Secondary | ICD-10-CM | POA: Diagnosis not present

## 2021-10-11 DIAGNOSIS — E669 Obesity, unspecified: Secondary | ICD-10-CM | POA: Diagnosis not present

## 2021-10-11 DIAGNOSIS — I10 Essential (primary) hypertension: Secondary | ICD-10-CM | POA: Diagnosis not present

## 2021-10-11 DIAGNOSIS — R7303 Prediabetes: Secondary | ICD-10-CM

## 2021-10-12 ENCOUNTER — Ambulatory Visit: Payer: Medicare HMO

## 2021-10-12 DIAGNOSIS — M5459 Other low back pain: Secondary | ICD-10-CM

## 2021-10-12 DIAGNOSIS — M79604 Pain in right leg: Secondary | ICD-10-CM

## 2021-10-12 DIAGNOSIS — R262 Difficulty in walking, not elsewhere classified: Secondary | ICD-10-CM

## 2021-10-12 DIAGNOSIS — M6281 Muscle weakness (generalized): Secondary | ICD-10-CM

## 2021-10-12 NOTE — Therapy (Signed)
?OUTPATIENT PHYSICAL THERAPY TREATMENT NOTE ? ? ?Patient Name: Rachel Woodard ?MRN: [id number] ?DOB:03-27-1951, 71 y.o., female ?Today's Date: 10/12/2021 ? ?PCP: Scheryl Marten, PA ?REFERRING PROVIDER: Aundra Dubin, PA-C ? ?END OF SESSION:  ? PT End of Session - 10/12/21 1315   ? ? Visit Number 4   ? Number of Visits 17   ? Date for PT Re-Evaluation 11/17/21   ? Authorization Type [employer] MCR   ? Authorization Time Period FOTO v6, v10, KX mod v15   ? Progress Note Due on Visit 10   ? PT Start Time 1215   ? PT Stop Time 1258   ? PT Time Calculation (min) 43 min   ? Activity Tolerance Patient tolerated treatment well   ? Behavior During Therapy [facility] for tasks assessed/performed   ? ?  ?  ? ?  ? ? ? ? ?Past Medical History:  ?Diagnosis Date  ? Anemia 2001  ? after gastric bypass  ? Anxiety   ? Aortic atherosclerosis ([place])   ? Arthritis   ? Back pain   ? Bilateral swelling of feet   ? Bipolar 1 disorder ([place])   ? Breast cancer ([place]) 2013  ? Breast cancer    Radiation therapy  ? Calcified granuloma of lung ([place])   ? Cigarette nicotine dependence   ? Cigarette nicotine dependence in remission   ? Cocaine dependence in remission [facility])   ? COPD (chronic obstructive pulmonary disease) ([place])   ? Depression   ? Disequilibrium   ? Fibromyalgia   ? GERD (gastroesophageal reflux disease)   ? HSV infection   ? Hypertension   ? Hypertensive kidney disease with stage 3a chronic kidney disease ([place])   ? Hypertensive kidney disease with stage 3b chronic kidney disease ([place])   ? Osteoarthritis   ? Personal history of infectious disease   ? Personal history of radiation therapy 2013  ? Pneumonia   ? Pre-diabetes   ? PTSD (post-traumatic stress disorder)   ? Seasonal allergic rhinitis due to pollen   ? Sleep apnea   ? uses CPAP  ? SOB (shortness of breath)   ? Vitamin D deficiency   ? ?Past Surgical History:  ?Procedure Laterality Date  ? ABDOMINAL HYSTERECTOMY  2018  ? BREAST LUMPECTOMY Right 2013  ? GASTRIC BYPASS  2001  ?  TOTAL KNEE ARTHROPLASTY Left 12/22/2018  ? Procedure: LEFT TOTAL KNEE ARTHROPLASTY;  Surgeon: Leandrew Koyanagi, MD;  Location: [place];  Service: Orthopedics;  Laterality: Left;  ? ?Patient Active Problem List  ? Diagnosis Date Noted  ? Pain due to onychomycosis of toenails of both feet 01/20/2020  ? Shortness of breath 10/05/2019  ? Status post total left knee replacement 12/22/2018  ? MDD (major depressive disorder), recurrent episode, mild ([place]) 10/29/2018  ? Autoimmune hepatitis ([place]) 10/29/2018  ? OSA (obstructive sleep apnea) 10/29/2018  ? Renal cyst, right 10/22/2018  ? Lung nodule, solitary 10/22/2018  ? Genital herpes simplex 10/29/2017  ? Vitamin D deficiency 10/29/2017  ? Class 3 severe obesity with serious comorbidity and body mass index (BMI) of 40.0 to 44.9 in adult [facility]) 10/29/2017  ? Essential hypertension 10/29/2017  ? Prediabetes 02/14/2017  ? Fibromyalgia 02/14/2017  ? Substance abuse in remission ([place]) 02/14/2017  ? Anxiety and depression 02/14/2017  ? HX: breast cancer 02/14/2017  ? Urge incontinence 11/13/2016  ? Unilateral primary osteoarthritis, left knee 06/14/2016  ? GERD (gastroesophageal reflux disease) 11/02/2015  ? Angioedema 10/24/2013  ? Tobacco  abuse 10/24/2013  ? DCIS (ductal carcinoma in situ) of breast 04/07/2013  ? Depression 04/07/2013  ? ? ?REFERRING DIAG: M79.604 (ICD-10-CM) - Pain in right leg ? ?THERAPY DIAG:  ?Pain in right leg ? ?Difficulty in walking, not elsewhere classified ? ?Muscle weakness (generalized) ? ?Other low back pain ? ?PERTINENT HISTORY: Anxiety, depression, Bipolar 1 disorder, fibromyalgia, HTN, pre-diabetes, COPD, hx of breast cx, aortic atherosclerosis, arthritis ? ?PRECAUTIONS: None ? ?ONSET DATE: 1 month ago ? ?SUBJECTIVE: Pt reports she is feeling much better, reporting no pain today and adding that she has been adherent to her HEP. ? ?PAIN:  ?Are you having pain? Yes:  ?NPRS scale: 0/10 ?Pain location: Rt hip, Lt shoulder ?Pain description: achy,  N/T ?Aggravating factors: standing > 5-10 minutes ?Relieving factors: IcyHot, muscle creams, and rest ? ? ?OBJECTIVE:  ? *Unless otherwise noted, objective measures collected previously* ? ?DIAGNOSTIC FINDINGS:  ?09/06/2021: XR Lumbar spine 2-3 views: Moderate degenerative changes L4-5 and L5-S1 ?09/06/2021: XR Hip unilat w/ or w/o pelvis Right: Moderate degenerative changes to the hip joint ?04/20/2021: XR Knee 3 view Left:  X-rays reveal well-seated prosthesis without complication ?10/272022: XR Knee 3 view Right: Significant degenerative changes to the medial and patellofemoral  ?compartments ?  ?PATIENT SURVEYS:  ?FOTO 58%, predicted 64% in 13 visits ?  ?SCREENING FOR RED FLAGS: ?Bowel or bladder incontinence: No ?Cauda equina syndrome: No ?  ?COGNITION: ?          Overall cognitive status: Within functional limits for tasks assessed               ?           ?SENSATION: ?Light touch: WFL ?  ?MUSCLE LENGTH: ?Hamstrings: WNL BIL ?Thomas test: (-) BIL ?Ober's test; (+) on Rt, (-) on Lt ?  ?POSTURE:  ?Decreased lumbar lordosis ?  ?PALPATION: ?TTP to Rt piriformis, glute max, BIL lumbar paraspinals/ QL at L3-L5, Exquisite TTP to Rt greater trochanter ?  ?PASSIVE ACCESSORIES: ?Hypomobile and painful L3-S1 ?  ?LUMBAR ROM:  ?  ?Active  A/PROM  ?09/22/2021  ?Flexion 80  ?Extension 20  ?Right lateral flexion To knee  ?Left lateral flexion To knee  ?Right rotation 80  ?Left rotation 80  ? (Blank rows = not tested) ?  ?  ?LE MMT: ?  ?MMT Right ?09/22/2021 Left ?09/22/2021  ?Hip flexion 4+/5 5/5  ?Hip extension 3+/5 4/5  ?Hip abduction 3+/5 4/5  ?Hip adduction 4/5 4/5  ?Hip internal rotation 4+/5 5/5  ?Hip external rotation 4+/5 5/5  ?Knee flexion 5/5 5/5  ?Knee extension 5/5 5/5  ?Ankle dorsiflexion 5/5 5/5  ?Ankle plantarflexion 5/5 5/5  ?Ankle inversion 5/5 5/5  ?Ankle eversion 5/5 5/5  ? (Blank rows = not tested) ?  ?LUMBAR SPECIAL TESTS:  ?Slump: (-) ?SLR: (-) ?PLE: (-) ?Gaenslen's: (-) ?Pelvic compression: (+) ?Pelvic  distraction: (-) ?Thigh thrust: (-) ?FABER: (-) ?FADIR: (-) ?Piriformis test: (+) on Rt ?Trendelenburg sign: (+) BIL ?  ?  ?FUNCTIONAL TESTS:  ?5xSTS: 12.45 seconds ?TUG: 14 seconds ?Squat: x3 WNL ?Plank: 11 seconds ?  ?GAIT: ?Distance walked: 42f ?Assistive device utilized: None ?Level of assistance: Complete Independence ?Comments: Increased BIL hip adduction, increased lateral pelvic motion, decreased stride length ?  ?  ?  ?TOD[facility]'S TREATMENT  ? [facility]  [facility] ?Therapeutic Exercise: ?Standing BIL shoulder extension with 3# cables 3x10 ?Cross-body shoulder adduction stretch 2x30sec BIL ?Bent-over rear delt flies with 2# dumbbells 2x10 BIL ?Squat into heel raise with overhead reach with waist attachment and two 7# cables at [place] machine 3x10 ?Mini-squat side step walk-outs with 10# waist attachment at [place] ?Standing shoulder ER with 3# cable 2x10 BIL ?Seatd BIL shoulder scaption with 3# dumbbells 3x10 ?Standing low trap lift-off at wall 3x10 ?Manual Therapy: ?N/A ?Neuromuscular re-ed: ?N/A ?Therapeutic Activity: ?N/A ?Modalities: ?N/A ?Self Care: ?N/A ? ? ?[place] Adult PT Treatment:                                                DATE: 10/10/2021 ?Therapeutic Exercise: ?Supine 90/90 abdominal isometric with handhold resistance 4x30sec ?Side knee planks 2x10 with 3-sec hold BIL ?Mini-squat side-step walkouts with 10# waist attachment at [facility] machine x5 BIL ?Standing Pallof press with 7# cable 2x8 with 5-sec hold BIL ?Standing open book stretch 2x10 BIL ?Standing abdominal press-down with 23# cable 2x10 ?Manual Therapy: ?N/A ?Neuromuscular re-ed: ?N/A ?Therapeutic Activity: ?N/A ?Modalities: ?N/A ?Self Care: ?N/A ? ? ?[place] Adult PT Treatment:                                                DATE: 10/04/2021 ?Therapeutic Exercise: ?Nustep level 5 x 5 mins ?Standing hip flexion/abduction/extension 2x10 BIL ?Mini squats 2x10 ?Seated  hip adduction ball squeeze with hip IR against RTB 5" hold 2x10 ?Supine clamshells GTB 2x15 ?Bridge with GTB 5" hold at top 2x10 ?Sidelying hip abduction Rt 2x10 ?STS x10 holding 7# dumbbell ?Supine hamstring stre

## 2021-10-14 NOTE — Therapy (Signed)
?OUTPATIENT PHYSICAL THERAPY TREATMENT NOTE ? ? ?Patient Name: Rachel Woodard ?MRN: [id number] ?DOB:05-13-1951, 71 y.o., female ?Today's Date: 10/17/2021 ? ?PCP: Scheryl Marten, PA ?REFERRING PROVIDER: Cipriano Mile, NP ? ?END OF SESSION:  ? PT End of Session - 10/17/21 1210   ? ? Visit Number 5   ? Number of Visits 17   ? Date for PT Re-Evaluation 11/17/21   ? Authorization Type [employer] MCR   ? Authorization Time Period FOTO v6, v10, KX mod v15   ? Progress Note Due on Visit 10   ? PT Start Time 1215   ? PT Stop Time 4650   ? PT Time Calculation (min) 42 min   ? Activity Tolerance Patient tolerated treatment well   ? Behavior During Therapy [facility] for tasks assessed/performed   ? ?  ?  ? ?  ? ? ? ? ? ?Past Medical History:  ?Diagnosis Date  ? Anemia 2001  ? after gastric bypass  ? Anxiety   ? Aortic atherosclerosis ([place])   ? Arthritis   ? Back pain   ? Bilateral swelling of feet   ? Bipolar 1 disorder ([place])   ? Breast cancer ([place]) 2013  ? Breast cancer    Radiation therapy  ? Calcified granuloma of lung ([place])   ? Cigarette nicotine dependence   ? Cigarette nicotine dependence in remission   ? Cocaine dependence in remission [facility])   ? COPD (chronic obstructive pulmonary disease) ([place])   ? Depression   ? Disequilibrium   ? Fibromyalgia   ? GERD (gastroesophageal reflux disease)   ? HSV infection   ? Hypertension   ? Hypertensive kidney disease with stage 3a chronic kidney disease ([place])   ? Hypertensive kidney disease with stage 3b chronic kidney disease ([place])   ? Osteoarthritis   ? Personal history of infectious disease   ? Personal history of radiation therapy 2013  ? Pneumonia   ? Pre-diabetes   ? PTSD (post-traumatic stress disorder)   ? Seasonal allergic rhinitis due to pollen   ? Sleep apnea   ? uses CPAP  ? SOB (shortness of breath)   ? Vitamin D deficiency   ? ?Past Surgical History:  ?Procedure Laterality Date  ? ABDOMINAL HYSTERECTOMY  2018  ? BREAST LUMPECTOMY Right 2013  ? GASTRIC BYPASS  2001  ?  TOTAL KNEE ARTHROPLASTY Left 12/22/2018  ? Procedure: LEFT TOTAL KNEE ARTHROPLASTY;  Surgeon: Leandrew Koyanagi, MD;  Location: [facility];  Service: Orthopedics;  Laterality: Left;  ? ?Patient Active Problem List  ? Diagnosis Date Noted  ? Pain due to onychomycosis of toenails of both feet 01/20/2020  ? Shortness of breath 10/05/2019  ? Status post total left knee replacement 12/22/2018  ? MDD (major depressive disorder), recurrent episode, mild ([place]) 10/29/2018  ? Autoimmune hepatitis ([place]) 10/29/2018  ? OSA (obstructive sleep apnea) 10/29/2018  ? Renal cyst, right 10/22/2018  ? Lung nodule, solitary 10/22/2018  ? Genital herpes simplex 10/29/2017  ? Vitamin D deficiency 10/29/2017  ? Class 3 severe obesity with serious comorbidity and body mass index (BMI) of 40.0 to 44.9 in adult [facility]) 10/29/2017  ? Essential hypertension 10/29/2017  ? Prediabetes 02/14/2017  ? Fibromyalgia 02/14/2017  ? Substance abuse in remission ([place]) 02/14/2017  ? Anxiety and depression 02/14/2017  ? HX: breast cancer 02/14/2017  ? Urge incontinence 11/13/2016  ? Unilateral primary osteoarthritis, left knee 06/14/2016  ? GERD (gastroesophageal reflux disease) 11/02/2015  ? Angioedema 10/24/2013  ? Tobacco  abuse 10/24/2013  ? DCIS (ductal carcinoma in situ) of breast 04/07/2013  ? Depression 04/07/2013  ? ? ?REFERRING DIAG: M79.604 (ICD-10-CM) - Pain in right leg ? ?THERAPY DIAG:  ?Pain in right leg ? ?Difficulty in walking, not elsewhere classified ? ?Muscle weakness (generalized) ? ?Other low back pain ? ?PERTINENT HISTORY: Anxiety, depression, Bipolar 1 disorder, fibromyalgia, HTN, pre-diabetes, COPD, hx of breast cx, aortic atherosclerosis, arthritis ? ?PRECAUTIONS: None ? ?ONSET DATE: 1 month ago ? ?SUBJECTIVE: Pt reports that she is having some difficulty walking today after having an ingrown toenail removed yesterday. She reports that her hip and shoulder continue to improve, adding that her HEP has been going well. ? ?PAIN:  ?Are you having  pain? Yes:  ?NPRS scale: 0/10 ?Pain location: Rt hip, Lt shoulder ?Pain description: achy, N/T ?Aggravating factors: standing > 5-10 minutes ?Relieving factors: IcyHot, muscle creams, and rest ? ? ?OBJECTIVE:  ? *Unless otherwise noted, objective measures collected previously* ? ?DIAGNOSTIC FINDINGS:  ?09/06/2021: XR Lumbar spine 2-3 views: Moderate degenerative changes L4-5 and L5-S1 ?09/06/2021: XR Hip unilat w/ or w/o pelvis Right: Moderate degenerative changes to the hip joint ?04/20/2021: XR Knee 3 view Left:  X-rays reveal well-seated prosthesis without complication ?10/272022: XR Knee 3 view Right: Significant degenerative changes to the medial and patellofemoral  ?compartments ?  ?PATIENT SURVEYS:  ?FOTO 58%, predicted 64% in 13 visits ?  ?SCREENING FOR RED FLAGS: ?Bowel or bladder incontinence: No ?Cauda equina syndrome: No ?  ?COGNITION: ?          Overall cognitive status: Within functional limits for tasks assessed               ?           ?SENSATION: ?Light touch: WFL ?  ?MUSCLE LENGTH: ?Hamstrings: WNL BIL ?Thomas test: (-) BIL ?Ober's test; (+) on Rt, (-) on Lt ?  ?POSTURE:  ?Decreased lumbar lordosis ?  ?PALPATION: ?TTP to Rt piriformis, glute max, BIL lumbar paraspinals/ QL at L3-L5, Exquisite TTP to Rt greater trochanter ?  ?PASSIVE ACCESSORIES: ?Hypomobile and painful L3-S1 ?  ?LUMBAR ROM:  ?  ?Active  A/PROM  ?09/22/2021  ?Flexion 80  ?Extension 20  ?Right lateral flexion To knee  ?Left lateral flexion To knee  ?Right rotation 80  ?Left rotation 80  ? (Blank rows = not tested) ?  ?  ?LE MMT: ?  ?MMT Right ?09/22/2021 Left ?09/22/2021  ?Hip flexion 4+/5 5/5  ?Hip extension 3+/5 4/5  ?Hip abduction 3+/5 4/5  ?Hip adduction 4/5 4/5  ?Hip internal rotation 4+/5 5/5  ?Hip external rotation 4+/5 5/5  ?Knee flexion 5/5 5/5  ?Knee extension 5/5 5/5  ?Ankle dorsiflexion 5/5 5/5  ?Ankle plantarflexion 5/5 5/5  ?Ankle inversion 5/5 5/5  ?Ankle eversion 5/5 5/5  ? (Blank rows = not tested) ?  ?LUMBAR SPECIAL  TESTS:  ?Slump: (-) ?SLR: (-) ?PLE: (-) ?Gaenslen's: (-) ?Pelvic compression: (+) ?Pelvic distraction: (-) ?Thigh thrust: (-) ?FABER: (-) ?FADIR: (-) ?Piriformis test: (+) on Rt ?Trendelenburg sign: (+) BIL ?  ?  ?FUNCTIONAL TESTS:  ?5xSTS: 12.45 seconds ?TUG: 14 seconds ?Squat: x3 WNL ?Plank: 11 seconds ? ?10/17/2021: TUG: 11.81 seconds ?  ?GAIT: ?Distance walked: 23f ?Assistive device utilized: None ?Level of assistance: Complete Independence ?Comments: Increased BIL hip adduction, increased lateral pelvic motion, decreased stride length ?  ?  ?  ?TODAY'S TREATMENT  ? [facility]  [facility] ?Therapeutic Exercise: ?Prone A with 3# dumbbells 2x10 ?Prone T with 2# dumbbells 2x10 ?Prone Y with 1# dumbbells 2x10 ?Side knee plank with clams 2x10 BIL ?Sidelying 3# shoulder ER 3x10 BIL ?Supine 90/90 hip flexion isometric with handhold resistance 4x30sec ?Seated low rows with 25# 2x10 ?Seated high rows with 25# 2x10 ?Seated lat pull-down with 25# 2x10 ?Seated diagonal trunk flexion stretch x10 BIL ?Manual Therapy: ?N/A ?Neuromuscular re-ed: ?N/A ?Therapeutic Activity: ?N/A ?Modalities: ?N/A ?Self Care: ?N/A ? ? ?[facility] Adult PT Treatment:                                                DATE: 10/12/2021 ?Therapeutic Exercise: ?Standing BIL shoulder extension with 3# cables 3x10 ?Cross-body shoulder adduction stretch 2x30sec BIL ?Bent-over rear delt flies with 2# dumbbells 2x10 BIL ?Squat into heel raise with overhead reach with waist attachment and two 7# cables at [place] machine 3x10 ?Mini-squat side step walk-outs with 10# waist attachment at [place] ?Standing shoulder ER with 3# cable 2x10 BIL ?Seated BIL shoulder scaption with 3# dumbbells 3x10 ?Standing low trap lift-off at wall 3x10 ?Manual Therapy: ?N/A ?Neuromuscular re-ed: ?N/A ?Therapeutic Activity: ?N/A ?Modalities: ?N/A ?Self Care: ?N/A ? ? ?[facility] Adult PT Treatment:                                                 DATE: 10/10/2021 ?Therapeutic Exercise: ?Supine 90/90 abdominal isometric with handhold resistance 4x30sec ?Side knee planks 2x10 with 3-sec hold BIL ?Mini-squat side-step walkouts with 10

## 2021-10-16 ENCOUNTER — Ambulatory Visit (INDEPENDENT_AMBULATORY_CARE_PROVIDER_SITE_OTHER): Payer: Medicare HMO | Admitting: Podiatry

## 2021-10-16 DIAGNOSIS — R52 Pain, unspecified: Secondary | ICD-10-CM

## 2021-10-16 DIAGNOSIS — L600 Ingrowing nail: Secondary | ICD-10-CM

## 2021-10-16 MED ORDER — GENTAMICIN SULFATE 0.1 % EX CREA: 1.0000 "application " | TOPICAL_CREAM | Freq: Two times a day (BID) | CUTANEOUS | 1 refills | Status: DC

## 2021-10-16 NOTE — Progress Notes (Signed)
? ?Subjective: ?Patient presents today for evaluation of pain to the medial border left great toe. Patient is concerned for possible ingrown nail.  It is very sensitive to touch.  Patient presents today for further treatment and evaluation. ? ?Past Medical History:  ?Diagnosis Date  ? Anemia 2001  ? after gastric bypass  ? Anxiety   ? Aortic atherosclerosis ([place])   ? Arthritis   ? Back pain   ? Bilateral swelling of feet   ? Bipolar 1 disorder ([place])   ? Breast cancer ([place]) 2013  ? Breast cancer    Radiation therapy  ? Calcified granuloma of lung ([place])   ? Cigarette nicotine dependence   ? Cigarette nicotine dependence in remission   ? Cocaine dependence in remission [facility])   ? COPD (chronic obstructive pulmonary disease) ([place])   ? Depression   ? Disequilibrium   ? Fibromyalgia   ? GERD (gastroesophageal reflux disease)   ? HSV infection   ? Hypertension   ? Hypertensive kidney disease with stage 3a chronic kidney disease ([place])   ? Hypertensive kidney disease with stage 3b chronic kidney disease ([place])   ? Osteoarthritis   ? Personal history of infectious disease   ? Personal history of radiation therapy 2013  ? Pneumonia   ? Pre-diabetes   ? PTSD (post-traumatic stress disorder)   ? Seasonal allergic rhinitis due to pollen   ? Sleep apnea   ? uses CPAP  ? SOB (shortness of breath)   ? Vitamin D deficiency   ? ? ?Objective:  ?General: Well developed, nourished, in no acute distress, alert and oriented x3  ? ?Dermatology: Skin is warm, dry and supple bilateral.  Medial border left great toe appears to be erythematous with evidence of an ingrowing nail. Pain on palpation noted to the border of the nail fold. The remaining nails appear unremarkable at this time. There are no open sores, lesions. ? ?Vascular: Dorsalis Pedis artery and Posterior Tibial artery pedal pulses palpable. No lower extremity edema noted.  ? ?Neruologic: Grossly intact via light touch bilateral. ? ?Musculoskeletal: Muscular strength within normal  limits in all groups bilateral. Normal range of motion noted to all pedal and ankle joints.  ? ?Assesement: ?#1 Paronychia with ingrowing nail medial border left great toe ?#2 Pain in toe ? ?Plan of Care:  ?1. Patient evaluated.  ?2. Discussed treatment alternatives and plan of care. Explained nail avulsion procedure and post procedure course to patient. ?3. Patient opted for permanent partial nail avulsion of the ingrown portion of the nail.  ?4. Prior to procedure, local anesthesia infiltration utilized using 3 ml of a 50:50 mixture of 2% plain lidocaine and 0.5% plain marcaine in a normal hallux block fashion and a betadine prep performed.  ?5. Partial permanent nail avulsion with chemical matrixectomy performed using [id number] applications of phenol followed by alcohol flush.  ?6. Light dressing applied.  Post care instructions provided ?7.  Prescription for gentamicin 2% cream  ?8.  Return to clinic 2 weeks. ? ?Edrick Kins, DPM ?[place] ? ?Dr. Edrick Kins, DPM  ?  ?[address]                                       ?[place],  [id number]                ?Office [phone]  ?  Fax [phone] ? ? ? ? ?

## 2021-10-17 ENCOUNTER — Ambulatory Visit: Payer: Medicare HMO

## 2021-10-17 DIAGNOSIS — M6281 Muscle weakness (generalized): Secondary | ICD-10-CM

## 2021-10-17 DIAGNOSIS — R262 Difficulty in walking, not elsewhere classified: Secondary | ICD-10-CM

## 2021-10-17 DIAGNOSIS — M5459 Other low back pain: Secondary | ICD-10-CM

## 2021-10-17 DIAGNOSIS — M79604 Pain in right leg: Secondary | ICD-10-CM | POA: Diagnosis not present

## 2021-10-18 ENCOUNTER — Ambulatory Visit
Admission: RE | Admit: 2021-10-18 | Discharge: 2021-10-18 | Disposition: A | Discharge status: Home / Self Care | Payer: Medicare HMO | Source: Ambulatory Visit | Attending: Internal Medicine | Admitting: Internal Medicine

## 2021-10-18 ENCOUNTER — Ambulatory Visit: Payer: Medicare HMO

## 2021-10-18 DIAGNOSIS — R928 Other abnormal and inconclusive findings on diagnostic imaging of breast: Secondary | ICD-10-CM

## 2021-10-18 NOTE — Progress Notes (Signed)
? ? ? ?Chief Complaint:  ? ?OBESITY ?Rachel Woodard is here to discuss her progress with her obesity treatment plan along with follow-up of her obesity related diagnoses. [name] is on keeping a food journal and adhering to recommended goals of 1350 to 1500 calories and 90 grams of protein and states she is following her eating plan approximately 90% of the time. [name] states she is doing physical therapy for 45 minutes 2 times per week. [name] is going to PT for hip and neck pain and is waiting for an appointment with pain management. ? ?Today's visit was #: 10 ?Starting weight: 258 lbs ?Starting date: 03/14/2021 ?Today's weight: 246 lbs ?Today's date: 10/11/2021 ?Total lbs lost to date: 12 ?Total lbs lost since last in-office visit: 0 ? ?Interim History: [name]'s food recall for yesterday: breakfast was coffee, milk, grits, 2 eggs, 2 pieces of bacon, and a bagel; lunch was a grilled ham and cheese sandwich, chips, and diet soda; dinner was 2 [place] burgers with cheese, sweet potato, and green beans; snack was a muffin.  ? ?Subjective:  ? ?1. Pre-diabetes ?[name] was on metformin and stopped due to side effects. Her last A1c was 6.0. ? ?2. Essential hypertension ?[name] is taking HCTZ '25mg'$  and Coreg 3.'125mg'$  BID. She denies side effects and chest pain, shortness of breath, and palpitations. ? ?Assessment/Plan:  ? ?1. Pre-diabetes ?Labs were obtained today and Jama agrees to follow up at the agreed upon time.   ? ?- Comprehensive metabolic panel ?- Hemoglobin A1c ? ?2. Essential hypertension ?Tonyetta continues to follow up with her PCP and cardiology. She agrees to continue her medications as directed. ? ?- Comprehensive metabolic panel ? ?3. Obesity with current BMI of 39.8 ?I would like [name] to journal until her next visit. I am concerned that she's eating too many calories. ? ?- Comprehensive metabolic panel ? ?[name] is currently in the action stage of change. As such, her goal is to continue with weight  loss efforts. She has agreed to keeping a food journal and adhering to recommended goals of 1350 to 1500 calories and 90+ grams of protein.  ? ?Exercise goals:  PT ? ?Behavioral modification strategies: increasing lean protein intake, increasing water intake, no skipping meals, meal planning and cooking strategies, and keeping a strict food journal. ? ?[name] has agreed to follow-up with our clinic in 3 weeks. She was informed of the importance of frequent follow-up visits to maximize her success with intensive lifestyle modifications for her multiple health conditions.  ? ?Objective:  ? ?Blood pressure 110/72, pulse 70, temperature 97.9 ?F (36.6 ?C), height '5\' 6"'$  (1.676 m), weight 246 lb (111.6 kg), SpO2 97 %. ?Body mass index is 39.71 kg/m?. ? ?General: Cooperative, alert, well developed, in no acute distress. ?HEENT: Conjunctivae and lids unremarkable. ?Cardiovascular: Regular rhythm.  ?Lungs: Normal work of breathing. ?Neurologic: No focal deficits.  ? ?Lab Results  ?Component Value Date  ? CREATININE 1.15 (H) 03/14/2021  ? BUN 20 03/14/2021  ? NA 139 03/14/2021  ? K 3.9 03/14/2021  ? CL 102 03/14/2021  ? CO2 22 03/14/2021  ? ?Lab Results  ?Component Value Date  ? ALT 15 03/14/2021  ? AST 17 03/14/2021  ? ALKPHOS 94 03/14/2021  ? BILITOT 0.2 03/14/2021  ? ?Lab Results  ?Component Value Date  ? HGBA1C 6.0 (H) 03/14/2021  ? HGBA1C 5.7 (H) 11/11/2018  ? HGBA1C 5.8 (H) 04/24/2018  ? HGBA1C 5.7 (H) 01/22/2018  ? HGBA1C 5.9 07/11/2017  ? ?Lab Results  ?Component  Value Date  ? INSULIN 14.4 03/14/2021  ? ?Lab Results  ?Component Value Date  ? TSH 3.320 03/14/2021  ? ?Lab Results  ?Component Value Date  ? CHOL 170 03/14/2021  ? HDL 53 03/14/2021  ? [place] 99 03/14/2021  ? TRIG 99 03/14/2021  ? CHOLHDL 3.1 07/11/2017  ? ?Lab Results  ?Component Value Date  ? VD25OH 33.0 03/14/2021  ? VD25OH 27.7 (L) 04/24/2018  ? VD25OH 23.0 (L) 01/22/2018  ? ?Lab Results  ?Component Value Date  ? WBC 7.6 03/14/2021  ? HGB 13.3  03/14/2021  ? HCT 40.5 03/14/2021  ? MCV 88 03/14/2021  ? PLT 255 03/14/2021  ? ?No results found for: IRON, TIBC, FERRITIN ? ?Obesity Behavioral Intervention:  ? ?Approximately 15 minutes were spent on the discussion below. ? ?ASK: ?We discussed the diagnosis of obesity with [place] today and Sibyl agreed to give [place] permission to discuss obesity behavioral modification therapy today. ? ?ASSESS: ?Wei has the diagnosis of obesity and her BMI today is 39.8. Rikayla is in the action stage of change.  ? ?ADVISE: ?Greer was educated on the multiple health risks of obesity as well as the benefit of weight loss to improve her health. She was advised of the need for long term treatment and the importance of lifestyle modifications to improve her current health and to decrease her risk of future health problems. ? ?AGREE: ?Multiple dietary modification options and treatment options were discussed and Henretter agreed to follow the recommendations documented in the above note. ? ?ARRANGE: ?Jaydalyn was educated on the importance of frequent visits to treat obesity as outlined per CMS and USPSTF guidelines and agreed to schedule her next follow up appointment today. ? ?Attestation Statements:  ? ?Reviewed by clinician on day of visit: allergies, medications, problem list, medical history, surgical history, family history, social history, and previous encounter notes. ? ?I, Marcille Blanco, CMA, am acting as transcriptionist for Everardo Pacific, [place] ? ?I have reviewed the above documentation for accuracy and completeness, and I agree with the above. Everardo Pacific, FNP  ?

## 2021-10-18 NOTE — Therapy (Incomplete)
?OUTPATIENT PHYSICAL THERAPY TREATMENT NOTE ? ? ?Patient Name: Rachel Woodard ?MRN: [id number] ?DOB:08-23-1950, 71 y.o., female ?Today's Date: 10/18/2021 ? ?PCP: Scheryl Marten, PA ?REFERRING PROVIDER: Aundra Dubin, PA-C ? ?END OF SESSION:  ? ? ? ? ? ? ?Past Medical History:  ?Diagnosis Date  ? Anemia 2001  ? after gastric bypass  ? Anxiety   ? Aortic atherosclerosis ([place])   ? Arthritis   ? Back pain   ? Bilateral swelling of feet   ? Bipolar 1 disorder ([place])   ? Breast cancer ([employer]) 2013  ? Breast cancer    Radiation therapy  ? Calcified granuloma of lung ([place])   ? Cigarette nicotine dependence   ? Cigarette nicotine dependence in remission   ? Cocaine dependence in remission [facility])   ? COPD (chronic obstructive pulmonary disease) ([place])   ? Depression   ? Disequilibrium   ? Fibromyalgia   ? GERD (gastroesophageal reflux disease)   ? HSV infection   ? Hypertension   ? Hypertensive kidney disease with stage 3a chronic kidney disease ([place])   ? Hypertensive kidney disease with stage 3b chronic kidney disease ([place])   ? Osteoarthritis   ? Personal history of infectious disease   ? Personal history of radiation therapy 2013  ? Pneumonia   ? Pre-diabetes   ? PTSD (post-traumatic stress disorder)   ? Seasonal allergic rhinitis due to pollen   ? Sleep apnea   ? uses CPAP  ? SOB (shortness of breath)   ? Vitamin D deficiency   ? ?Past Surgical History:  ?Procedure Laterality Date  ? ABDOMINAL HYSTERECTOMY  2018  ? BREAST LUMPECTOMY Right 2013  ? GASTRIC BYPASS  2001  ? TOTAL KNEE ARTHROPLASTY Left 12/22/2018  ? Procedure: LEFT TOTAL KNEE ARTHROPLASTY;  Surgeon: Leandrew Koyanagi, MD;  Location: [facility];  Service: Orthopedics;  Laterality: Left;  ? ?Patient Active Problem List  ? Diagnosis Date Noted  ? Pain due to onychomycosis of toenails of both feet 01/20/2020  ? Shortness of breath 10/05/2019  ? Status post total left knee replacement 12/22/2018  ? MDD (major depressive disorder), recurrent episode, mild ([place])  10/29/2018  ? Autoimmune hepatitis ([place]) 10/29/2018  ? OSA (obstructive sleep apnea) 10/29/2018  ? Renal cyst, right 10/22/2018  ? Lung nodule, solitary 10/22/2018  ? Genital herpes simplex 10/29/2017  ? Vitamin D deficiency 10/29/2017  ? Class 3 severe obesity with serious comorbidity and body mass index (BMI) of 40.0 to 44.9 in adult [facility]) 10/29/2017  ? Essential hypertension 10/29/2017  ? Prediabetes 02/14/2017  ? Fibromyalgia 02/14/2017  ? Substance abuse in remission ([place]) 02/14/2017  ? Anxiety and depression 02/14/2017  ? HX: breast cancer 02/14/2017  ? Urge incontinence 11/13/2016  ? Unilateral primary osteoarthritis, left knee 06/14/2016  ? GERD (gastroesophageal reflux disease) 11/02/2015  ? Angioedema 10/24/2013  ? Tobacco abuse 10/24/2013  ? DCIS (ductal carcinoma in situ) of breast 04/07/2013  ? Depression 04/07/2013  ? ? ?REFERRING DIAG: M79.604 (ICD-10-CM) - Pain in right leg ? ?THERAPY DIAG:  ?No diagnosis found. ? ?PERTINENT HISTORY: Anxiety, depression, Bipolar 1 disorder, fibromyalgia, HTN, pre-diabetes, COPD, hx of breast cx, aortic atherosclerosis, arthritis ? ?PRECAUTIONS: None ? ?ONSET DATE: 1 month ago ? ?SUBJECTIVE: *** ? ?PAIN:  ?Are you having pain? Yes:  ?NPRS scale: 0/10 ?Pain location: Rt hip, Lt shoulder ?Pain description: achy, N/T ?Aggravating factors: standing > 5-10 minutes ?Relieving factors: IcyHot, muscle creams, and rest ? ? ?OBJECTIVE:  ? *Unless otherwise  noted, objective measures collected previously* ? ?DIAGNOSTIC FINDINGS:  ?09/06/2021: XR Lumbar spine 2-3 views: Moderate degenerative changes L4-5 and L5-S1 ?09/06/2021: XR Hip unilat w/ or w/o pelvis Right: Moderate degenerative changes to the hip joint ?04/20/2021: XR Knee 3 view Left:  X-rays reveal well-seated prosthesis without complication ?10/272022: XR Knee 3 view Right: Significant degenerative changes to the medial and patellofemoral  ?compartments ?  ?PATIENT SURVEYS:  ?FOTO 58%, predicted 64% in 13 visits ?   ?SCREENING FOR RED FLAGS: ?Bowel or bladder incontinence: No ?Cauda equina syndrome: No ?  ?COGNITION: ?          Overall cognitive status: Within functional limits for tasks assessed               ?           ?SENSATION: ?Light touch: WFL ?  ?MUSCLE LENGTH: ?Hamstrings: WNL BIL ?Thomas test: (-) BIL ?Ober's test; (+) on Rt, (-) on Lt ?  ?POSTURE:  ?Decreased lumbar lordosis ?  ?PALPATION: ?TTP to Rt piriformis, glute max, BIL lumbar paraspinals/ QL at L3-L5, Exquisite TTP to Rt greater trochanter ?  ?PASSIVE ACCESSORIES: ?Hypomobile and painful L3-S1 ?  ?LUMBAR ROM:  ?  ?Active  A/PROM  ?09/22/2021  ?Flexion 80  ?Extension 20  ?Right lateral flexion To knee  ?Left lateral flexion To knee  ?Right rotation 80  ?Left rotation 80  ? (Blank rows = not tested) ?  ?  ?LE MMT: ?  ?MMT Right ?09/22/2021 Left ?09/22/2021  ?Hip flexion 4+/5 5/5  ?Hip extension 3+/5 4/5  ?Hip abduction 3+/5 4/5  ?Hip adduction 4/5 4/5  ?Hip internal rotation 4+/5 5/5  ?Hip external rotation 4+/5 5/5  ?Knee flexion 5/5 5/5  ?Knee extension 5/5 5/5  ?Ankle dorsiflexion 5/5 5/5  ?Ankle plantarflexion 5/5 5/5  ?Ankle inversion 5/5 5/5  ?Ankle eversion 5/5 5/5  ? (Blank rows = not tested) ?  ?LUMBAR SPECIAL TESTS:  ?Slump: (-) ?SLR: (-) ?PLE: (-) ?Gaenslen's: (-) ?Pelvic compression: (+) ?Pelvic distraction: (-) ?Thigh thrust: (-) ?FABER: (-) ?FADIR: (-) ?Piriformis test: (+) on Rt ?Trendelenburg sign: (+) BIL ?  ?  ?FUNCTIONAL TESTS:  ?5xSTS: 12.45 seconds ?TUG: 14 seconds ?Squat: x3 WNL ?Plank: 11 seconds ? ?10/17/2021: TUG: 11.81 seconds ?  ?GAIT: ?Distance walked: 3f ?Assistive device utilized: None ?Level of assistance: Complete Independence ?Comments: Increased BIL hip adduction, increased lateral pelvic motion, decreased stride length ?  ?  ?  ?[facility]'S TREATMENT  ? [facility]                                                [facility] ?Therapeutic Exercise: ?*** ?Manual Therapy: ?*** ?Neuromuscular re-ed: ?*** ?Therapeutic  Activity: ?*** ?Modalities: ?*** ?Self Care: ?*** ? ? ?[facility]:                                                DATE: 10/17/2021 ?Therapeutic Exercise: ?Prone A with 3# dumbbells 2x10 ?Prone T with 2# dumbbells 2x10 ?Prone Y with 1# dumbbells 2x10 ?Side knee plank with clams 2x10 BIL ?Sidelying 3# shoulder ER 3x10 BIL ?Supine 90/90 hip flexion isometric with handhold resistance 4x30sec ?Seated low rows with 25# 2x10 ?Seated high  rows with 25# 2x10 ?Seated lat pull-down with 25# 2x10 ?Seated diagonal trunk flexion stretch x10 BIL ?Manual Therapy: ?N/A ?Neuromuscular re-ed: ?N/A ?Therapeutic Activity: ?N/A ?Modalities: ?N/A ?Self Care: ?N/A ? ? ?[facility] Adult PT Treatment:                                                DATE: 10/12/2021 ?Therapeutic Exercise: ?Standing BIL shoulder extension with 3# cables 3x10 ?Cross-body shoulder adduction stretch 2x30sec BIL ?Bent-over rear delt flies with 2# dumbbells 2x10 BIL ?Squat into heel raise with overhead reach with waist attachment and two 7# cables at [place] machine 3x10 ?Mini-squat side step walk-outs with 10# waist attachment at [place] ?Standing shoulder ER with 3# cable 2x10 BIL ?Seated BIL shoulder scaption with 3# dumbbells 3x10 ?Standing low trap lift-off at wall 3x10 ?Manual Therapy: ?N/A ?Neuromuscular re-ed: ?N/A ?Therapeutic Activity: ?N/A ?Modalities: ?N/A ?Self Care: ?N/A ? ? ? ?  ?  ?PATIENT EDUCATION:  ?Education details: Pt educated on probable underlying pathophysiology behind her pain presentation, POC, prognosis, FOTO, and HEP ?Person educated: Patient ?Education method: Explanation, Demonstration, and Handouts ?Education comprehension: verbalized understanding and returned demonstration ?  ?  ?HOME EXERCISE PROGRAM: ?Access Code: [email or web address] ?URL: [email or web address] ?Date: 09/22/2021 ?Prepared by: Vanessa [place] ?  ?Exercises ?- Seated Slump Nerve Glide  - 1 x daily - 7 x weekly - 3 sets - 20 reps ?- Plank on Knees   - 1 x daily - 7 x weekly - 3 sets - 10 reps ?- Supine Piriformis Stretch with Foot on Ground  - 1 x daily - 7 x weekly - 2 sets - 1-min hold ?- Standing ITB Stretch  - 1 x daily - 7 x weekly - 2 sets - 1-min hold ?

## 2021-10-19 ENCOUNTER — Ambulatory Visit: Payer: Medicare HMO

## 2021-10-21 NOTE — Therapy (Signed)
?OUTPATIENT PHYSICAL THERAPY TREATMENT NOTE ? ? ?Patient Name: Rachel Woodard ?MRN: [id number] ?DOB:09-Nov-1950, 71 y.o., female ?Today's Date: 10/24/2021 ? ?PCP: Scheryl Marten, PA ?REFERRING PROVIDER: Cipriano Mile, NP ? ?END OF SESSION:  ? PT End of Session - 10/24/21 1200   ? ? Visit Number [phone]   ? Number of Visits 17   ? Date for PT Re-Evaluation 11/17/21   ? Authorization Type [employer] MCR   ? Authorization Time Period FOTO v6, v10, KX mod v15   ? Progress Note Due on Visit 10   ? PT Start Time 1215   ? PT Stop Time 1258   ? PT Time Calculation (min) 43 min   ? Activity Tolerance Patient tolerated treatment well   ? Behavior During Therapy [facility] for tasks assessed/performed   ? ?  ?  ? ?  ? ? ? ? ? ? ?Past Medical History:  ?Diagnosis Date  ? Anemia 2001  ? after gastric bypass  ? Anxiety   ? Aortic atherosclerosis ([place])   ? Arthritis   ? Back pain   ? Bilateral swelling of feet   ? Bipolar 1 disorder ([place])   ? Breast cancer ([place]) 2013  ? Breast cancer    Radiation therapy  ? Calcified granuloma of lung ([place])   ? Cigarette nicotine dependence   ? Cigarette nicotine dependence in remission   ? Cocaine dependence in remission [facility])   ? COPD (chronic obstructive pulmonary disease) ([place])   ? Depression   ? Disequilibrium   ? Fibromyalgia   ? GERD (gastroesophageal reflux disease)   ? HSV infection   ? Hypertension   ? Hypertensive kidney disease with stage 3a chronic kidney disease ([place])   ? Hypertensive kidney disease with stage 3b chronic kidney disease ([place])   ? Osteoarthritis   ? Personal history of infectious disease   ? Personal history of radiation therapy 2013  ? Pneumonia   ? Pre-diabetes   ? PTSD (post-traumatic stress disorder)   ? Seasonal allergic rhinitis due to pollen   ? Sleep apnea   ? uses CPAP  ? SOB (shortness of breath)   ? Vitamin D deficiency   ? ?Past Surgical History:  ?Procedure Laterality Date  ? ABDOMINAL HYSTERECTOMY  2018  ? BREAST LUMPECTOMY Right 2013  ? GASTRIC BYPASS  2001  ?  TOTAL KNEE ARTHROPLASTY Left 12/22/2018  ? Procedure: LEFT TOTAL KNEE ARTHROPLASTY;  Surgeon: Leandrew Koyanagi, MD;  Location: [facility];  Service: Orthopedics;  Laterality: Left;  ? ?Patient Active Problem List  ? Diagnosis Date Noted  ? Pain due to onychomycosis of toenails of both feet 01/20/2020  ? Shortness of breath 10/05/2019  ? Status post total left knee replacement 12/22/2018  ? MDD (major depressive disorder), recurrent episode, mild ([place]) 10/29/2018  ? Autoimmune hepatitis ([place]) 10/29/2018  ? OSA (obstructive sleep apnea) 10/29/2018  ? Renal cyst, right 10/22/2018  ? Lung nodule, solitary 10/22/2018  ? Genital herpes simplex 10/29/2017  ? Vitamin D deficiency 10/29/2017  ? Class 3 severe obesity with serious comorbidity and body mass index (BMI) of 40.0 to 44.9 in adult [facility]) 10/29/2017  ? Essential hypertension 10/29/2017  ? Prediabetes 02/14/2017  ? Fibromyalgia 02/14/2017  ? Substance abuse in remission ([place]) 02/14/2017  ? Anxiety and depression 02/14/2017  ? HX: breast cancer 02/14/2017  ? Urge incontinence 11/13/2016  ? Unilateral primary osteoarthritis, left knee 06/14/2016  ? GERD (gastroesophageal reflux disease) 11/02/2015  ? Angioedema 10/24/2013  ?  Tobacco abuse 10/24/2013  ? DCIS (ductal carcinoma in situ) of breast 04/07/2013  ? Depression 04/07/2013  ? ? ?REFERRING DIAG: M79.604 (ICD-10-CM) - Pain in right leg ? ?THERAPY DIAG:  ?Pain in right leg ? ?Difficulty in walking, not elsewhere classified ? ?Muscle weakness (generalized) ? ?Other low back pain ? ?PERTINENT HISTORY: Anxiety, depression, Bipolar 1 disorder, fibromyalgia, HTN, pre-diabetes, COPD, hx of breast cx, aortic atherosclerosis, arthritis ? ?PRECAUTIONS: None ? ?ONSET DATE: 1 month ago ? ?SUBJECTIVE: Pt reports no hip problems recently, only Lt shoulder pain which can wake her up at night, but gets better through the day.  ? ?PAIN:  ?Are you having pain? Yes:  ?NPRS scale: 0/10 ?Pain location: Rt hip, Lt shoulder ?Pain description:  achy, N/T ?Aggravating factors: standing > 5-10 minutes ?Relieving factors: IcyHot, muscle creams, and rest ? ? ?OBJECTIVE:  ? *Unless otherwise noted, objective measures collected previously* ? ?DIAGNOSTIC FINDINGS:  ?09/06/2021: XR Lumbar spine 2-3 views: Moderate degenerative changes L4-5 and L5-S1 ?09/06/2021: XR Hip unilat w/ or w/o pelvis Right: Moderate degenerative changes to the hip joint ?04/20/2021: XR Knee 3 view Left:  X-rays reveal well-seated prosthesis without complication ?10/272022: XR Knee 3 view Right: Significant degenerative changes to the medial and patellofemoral  ?compartments ?  ?PATIENT SURVEYS:  ?FOTO 58%, predicted 64% in 13 visits ?10/24/2021: 75% ?  ?SCREENING FOR RED FLAGS: ?Bowel or bladder incontinence: No ?Cauda equina syndrome: No ?  ?COGNITION: ?          Overall cognitive status: Within functional limits for tasks assessed               ?           ?SENSATION: ?Light touch: WFL ?  ?MUSCLE LENGTH: ?Hamstrings: WNL BIL ?Thomas test: (-) BIL ?Ober's test; (+) on Rt, (-) on Lt ?  ?POSTURE:  ?Decreased lumbar lordosis ?  ?PALPATION: ?TTP to Rt piriformis, glute max, BIL lumbar paraspinals/ QL at L3-L5, Exquisite TTP to Rt greater trochanter ?  ?PASSIVE ACCESSORIES: ?Hypomobile and painful L3-S1 ?  ?LUMBAR ROM:  ?  ?Active  A/PROM  ?09/22/2021  ?Flexion 80  ?Extension 20  ?Right lateral flexion To knee  ?Left lateral flexion To knee  ?Right rotation 80  ?Left rotation 80  ? (Blank rows = not tested) ?  ?  ?LE MMT: ?  ?MMT Right ?09/22/2021 Left ?09/22/2021 Right ?10/24/2021  ?Hip flexion 4+/5 5/5 5/5  ?Hip extension 3+/5 4/5 4+/5  ?Hip abduction 3+/5 4/5 4+/5  ?Hip adduction 4/5 4/5 5/5  ?Hip internal rotation 4+/5 5/5 5/5  ?Hip external rotation 4+/5 5/5 5/5  ?Knee flexion 5/5 5/5   ?Knee extension 5/5 5/5   ?Ankle dorsiflexion 5/5 5/5   ?Ankle plantarflexion 5/5 5/5   ?Ankle inversion 5/5 5/5   ?Ankle eversion 5/5 5/5   ? (Blank rows = not tested) ? ? ?UE MMT: ?  ?MMT Right ?10/24/2021  Left ?10/24/2021  ?Shoulder flexion 5/5 5/5  ?Shoulder abduction 5/5 4/5p!  ?Shoulder ER 4+/5 4/5p!  ?Shoulder IR 5/5 5/5  ? ?  ?LUMBAR SPECIAL TESTS:  ?Slump: (-) ?SLR: (-) ?PLE: (-) ?Gaenslen's: (-) ?Pelvic compression: (+) ?Pelvic distraction: (-) ?Thigh thrust: (-) ?FABER: (-) ?FADIR: (-) ?Piriformis test: (+) on Rt ?Trendelenburg sign: (+) BIL ? ?SHOULDER SPECIAL TESTS:  ? ?(10/24/2021) ?Hawkins-Kennedy (+) on Lt ?Neer's: (+) on Lt ?  ?  ?FUNCTIONAL TESTS:  ?5xSTS: 12.45 seconds ?TUG: 14 seconds ?Squat: x3 WNL ?Plank: 11 seconds ? ?10/17/2021: TUG: 11.81  seconds ?  ?GAIT: ?Distance walked: 17f ?Assistive device utilized: None ?Level of assistance: Complete Independence ?Comments: Increased BIL hip adduction, increased lateral pelvic motion, decreased stride length ?  ?  ?  ?TODAY'S TREATMENT  ? ?[facility]                                                DATE: 10/24/2021 ?Therapeutic Exercise: ?Standing rear delt flies with 3# cable 2x10 on Lt ?Standing shoulder cross-body adduction stretch 2x133m on Lt ?Standing scaption with two 3# dumbbells with back on wall and mirror biofeedback 3x10 ?Standing shoulder rolls 2x10 forward and backward ?Standing shoulder extension with two 7# cables 3x10 ?Standing arm circles with shoulder abduction from hips to overhead 2x10 ?Prone swimmers 2x20   ?Manual Therapy: ?N/A ?Neuromuscular re-ed: ?N/A ?Therapeutic Activity: ?N/A ?Modalities: ?N/A ?Self Care: ?N/A ? ? ?[facility]:                                                DATE: 10/17/2021 ?Therapeutic Exercise: ?Prone A with 3# dumbbells 2x10 ?Prone T with 2# dumbbells 2x10 ?Prone Y with 1# dumbbells 2x10 ?Side knee plank with clams 2x10 BIL ?Sidelying 3# shoulder ER 3x10 BIL ?Supine 90/90 hip flexion isometric with handhold resistance 4x30sec ?Seated low rows with 25# 2x10 ?Seated high rows with 25# 2x10 ?Seated lat pull-down with 25# 2x10 ?Seated diagonal trunk flexion stretch x10 BIL ?Manual  Therapy: ?N/A ?Neuromuscular re-ed: ?N/A ?Therapeutic Activity: ?N/A ?Modalities: ?N/A ?Self Care: ?N/A ? ? ?[facility]ult PT Treatment:                                                DATE: 10/12/2021 ?Therapeutic Exercise: ?StCherlynn Kaiser

## 2021-10-23 DIAGNOSIS — R202 Paresthesia of skin: Secondary | ICD-10-CM | POA: Diagnosis not present

## 2021-10-23 DIAGNOSIS — M7989 Other specified soft tissue disorders: Secondary | ICD-10-CM | POA: Diagnosis not present

## 2021-10-24 ENCOUNTER — Ambulatory Visit: Payer: Medicare HMO | Attending: Physician Assistant

## 2021-10-24 DIAGNOSIS — M79604 Pain in right leg: Secondary | ICD-10-CM | POA: Diagnosis not present

## 2021-10-24 DIAGNOSIS — M6281 Muscle weakness (generalized): Secondary | ICD-10-CM

## 2021-10-24 DIAGNOSIS — R262 Difficulty in walking, not elsewhere classified: Secondary | ICD-10-CM

## 2021-10-24 DIAGNOSIS — M5459 Other low back pain: Secondary | ICD-10-CM | POA: Diagnosis not present

## 2021-10-25 NOTE — Therapy (Signed)
?OUTPATIENT PHYSICAL THERAPY TREATMENT NOTE ? ? ?Patient Name: Rachel Woodard ?MRN: [id number] ?DOB:Mar 04, 1951, 71 y.o., female ?Today's Date: 10/26/2021 ? ?PCP: Scheryl Marten, PA ?REFERRING PROVIDER: Aundra Dubin, PA-C ? ?END OF SESSION:  ? PT End of Session - 10/26/21 1219   ? ? Visit Number 7   ? Number of Visits 17   ? Date for PT Re-Evaluation 11/17/21   ? Authorization Type [employer] MCR   ? Authorization Time Period FOTO v6, v10, KX mod v15   ? Progress Note Due on Visit 10   ? PT Start Time 1220   ? PT Stop Time 1258   ? PT Time Calculation (min) 38 min   ? Activity Tolerance Patient tolerated treatment well   ? Behavior During Therapy [facility] for tasks assessed/performed   ? ?  ?  ? ?  ? ? ? ? ? ? ? ?Past Medical History:  ?Diagnosis Date  ? Anemia 2001  ? after gastric bypass  ? Anxiety   ? Aortic atherosclerosis ([place])   ? Arthritis   ? Back pain   ? Bilateral swelling of feet   ? Bipolar 1 disorder ([place])   ? Breast cancer ([place]) 2013  ? Breast cancer    Radiation therapy  ? Calcified granuloma of lung ([place])   ? Cigarette nicotine dependence   ? Cigarette nicotine dependence in remission   ? Cocaine dependence in remission [facility])   ? COPD (chronic obstructive pulmonary disease) ([place])   ? Depression   ? Disequilibrium   ? Fibromyalgia   ? GERD (gastroesophageal reflux disease)   ? HSV infection   ? Hypertension   ? Hypertensive kidney disease with stage 3a chronic kidney disease ([place])   ? Hypertensive kidney disease with stage 3b chronic kidney disease ([place])   ? Osteoarthritis   ? Personal history of infectious disease   ? Personal history of radiation therapy 2013  ? Pneumonia   ? Pre-diabetes   ? PTSD (post-traumatic stress disorder)   ? Seasonal allergic rhinitis due to pollen   ? Sleep apnea   ? uses CPAP  ? SOB (shortness of breath)   ? Vitamin D deficiency   ? ?Past Surgical History:  ?Procedure Laterality Date  ? ABDOMINAL HYSTERECTOMY  2018  ? BREAST LUMPECTOMY Right 2013  ? GASTRIC BYPASS   2001  ? TOTAL KNEE ARTHROPLASTY Left 12/22/2018  ? Procedure: LEFT TOTAL KNEE ARTHROPLASTY;  Surgeon: Leandrew Koyanagi, MD;  Location: [place];  Service: Orthopedics;  Laterality: Left;  ? ?Patient Active Problem List  ? Diagnosis Date Noted  ? Pain due to onychomycosis of toenails of both feet 01/20/2020  ? Shortness of breath 10/05/2019  ? Status post total left knee replacement 12/22/2018  ? MDD (major depressive disorder), recurrent episode, mild ([place]) 10/29/2018  ? Autoimmune hepatitis ([place]) 10/29/2018  ? OSA (obstructive sleep apnea) 10/29/2018  ? Renal cyst, right 10/22/2018  ? Lung nodule, solitary 10/22/2018  ? Genital herpes simplex 10/29/2017  ? Vitamin D deficiency 10/29/2017  ? Class 3 severe obesity with serious comorbidity and body mass index (BMI) of 40.0 to 44.9 in adult [facility]) 10/29/2017  ? Essential hypertension 10/29/2017  ? Prediabetes 02/14/2017  ? Fibromyalgia 02/14/2017  ? Substance abuse in remission ([place]) 02/14/2017  ? Anxiety and depression 02/14/2017  ? HX: breast cancer 02/14/2017  ? Urge incontinence 11/13/2016  ? Unilateral primary osteoarthritis, left knee 06/14/2016  ? GERD (gastroesophageal reflux disease) 11/02/2015  ? Angioedema 10/24/2013  ?  Tobacco abuse 10/24/2013  ? DCIS (ductal carcinoma in situ) of breast 04/07/2013  ? Depression 04/07/2013  ? ? ?REFERRING DIAG: M79.604 (ICD-10-CM) - Pain in right leg ? ?THERAPY DIAG:  ?Pain in right leg ? ?Difficulty in walking, not elsewhere classified ? ?Muscle weakness (generalized) ? ?Other low back pain ? ?PERTINENT HISTORY: Anxiety, depression, Bipolar 1 disorder, fibromyalgia, HTN, pre-diabetes, COPD, hx of breast cx, aortic atherosclerosis, arthritis ? ?PRECAUTIONS: None ? ?ONSET DATE: 1 month ago ? ?SUBJECTIVE: Pt reports continued 4/10 Lt shoulder pain and no hip pain, although she does report increased Lt hip pain this morning which she attributes to her fibromyalgia. She reports walking 3 miles yesterday. She also reports adherence to  her HEP.  ? ?PAIN:  ?Are you having pain? Yes:  ?NPRS scale: 4/10 ?Pain location: Lt shoulder ?Pain description: achy, N/T ?Aggravating factors: standing > 5-10 minutes ?Relieving factors: IcyHot, muscle creams, and rest ? ? ?OBJECTIVE:  ? *Unless otherwise noted, objective measures collected previously* ? ?DIAGNOSTIC FINDINGS:  ?09/06/2021: XR Lumbar spine 2-3 views: Moderate degenerative changes L4-5 and L5-S1 ?09/06/2021: XR Hip unilat w/ or w/o pelvis Right: Moderate degenerative changes to the hip joint ?04/20/2021: XR Knee 3 view Left:  X-rays reveal well-seated prosthesis without complication ?10/272022: XR Knee 3 view Right: Significant degenerative changes to the medial and patellofemoral  ?compartments ?  ?PATIENT SURVEYS:  ?FOTO 58%, predicted 64% in 13 visits ?10/24/2021: 75% (Goal met) ?  ?SCREENING FOR RED FLAGS: ?Bowel or bladder incontinence: No ?Cauda equina syndrome: No ?  ?COGNITION: ?          Overall cognitive status: Within functional limits for tasks assessed               ?           ?SENSATION: ?Light touch: WFL ?  ?MUSCLE LENGTH: ?Hamstrings: WNL BIL ?Thomas test: (-) BIL ?Ober's test; (+) on Rt, (-) on Lt ?  ?POSTURE:  ?Decreased lumbar lordosis ?  ?PALPATION: ?TTP to Rt piriformis, glute max, BIL lumbar paraspinals/ QL at L3-L5, Exquisite TTP to Rt greater trochanter ?  ?PASSIVE ACCESSORIES: ?Hypomobile and painful L3-S1 ?  ?LUMBAR ROM:  ?  ?Active  A/PROM  ?09/22/2021  ?Flexion 80  ?Extension 20  ?Right lateral flexion To knee  ?Left lateral flexion To knee  ?Right rotation 80  ?Left rotation 80  ? (Blank rows = not tested) ?  ?  ?LE MMT: ?  ?MMT Right ?09/22/2021 Left ?09/22/2021 Right ?10/24/2021  ?Hip flexion 4+/5 5/5 5/5  ?Hip extension 3+/5 4/5 4+/5  ?Hip abduction 3+/5 4/5 4+/5  ?Hip adduction 4/5 4/5 5/5  ?Hip internal rotation 4+/5 5/5 5/5  ?Hip external rotation 4+/5 5/5 5/5  ?Knee flexion 5/5 5/5   ?Knee extension 5/5 5/5   ?Ankle dorsiflexion 5/5 5/5   ?Ankle plantarflexion 5/5 5/5    ?Ankle inversion 5/5 5/5   ?Ankle eversion 5/5 5/5   ? (Blank rows = not tested) ? ? ?UE MMT: ?  ?MMT Right ?10/24/2021 Left ?10/24/2021  ?Shoulder flexion 5/5 5/5  ?Shoulder abduction 5/5 4/5p!  ?Shoulder ER 4+/5 4/5p!  ?Shoulder IR 5/5 5/5  ? ?  ?LUMBAR SPECIAL TESTS:  ?Slump: (-) ?SLR: (-) ?PLE: (-) ?Gaenslen's: (-) ?Pelvic compression: (+) ?Pelvic distraction: (-) ?Thigh thrust: (-) ?FABER: (-) ?FADIR: (-) ?Piriformis test: (+) on Rt ?Trendelenburg sign: (+) BIL ? ?SHOULDER SPECIAL TESTS:  ? ?(10/24/2021) ?Hawkins-Kennedy (+) on Lt ?Neer's: (+) on Lt ?  ?  ?FUNCTIONAL TESTS:  ?  5xSTS: 12.45 seconds ?TUG: 14 seconds ?Squat: x3 WNL ?Plank: 11 seconds ? ?10/17/2021: TUG: 11.81 seconds ?  ?GAIT: ?Distance walked: 32f ?Assistive device utilized: None ?Level of assistance: Complete Independence ?Comments: Increased BIL hip adduction, increased lateral pelvic motion, decreased stride length ?  ?  ?  ?TODAY'S TREATMENT  ? ?[facility]                                                DATE: 10/26/2021 ?Therapeutic Exercise: ?UBE level 1 x2 min forward/ x2 min backward while collecting subjective information ?15# kettlebell swings 3x15 ?Prone swimmers with YTB around ankles 2x20 ?Seated low rows with 30# 2x10 ?Seated high rows with 30# 2x10 ?Seated lat pull-down with 30# 2x10 ?Seated shoulder rolls 2x10 forward and backward ?Mid-range shoulder flexion isometric with GTB lateral perturbations 3x30sec BIL ?5# kettlebell waiter's carry 5x531fon Lt ?Standing shoulder horizontal adduction stretch x1m47mon Lt ?Manual Therapy: ?N/A ?Neuromuscular re-ed: ?N/A ?Therapeutic Activity: ?N/A ?Modalities: ?N/A ?Self Care: ?N/A ? ? ?[facility]:                                                DATE: 10/24/2021 ?Therapeutic Exercise: ?Standing rear delt flies with 3# cable 2x10 on Lt ?Standing shoulder cross-body adduction stretch 2x1mi42mn Lt ?Standing scaption with two 3# dumbbells with back on wall and mirror biofeedback  3x10 ?Standing shoulder rolls 2x10 forward and backward ?Standing shoulder extension with two 7# cables 3x10 ?Standing arm circles with shoulder abduction from hips to overhead 2x10 ?Prone swimmers 2x20   ?M

## 2021-10-26 ENCOUNTER — Ambulatory Visit: Payer: Medicare HMO

## 2021-10-26 DIAGNOSIS — M6281 Muscle weakness (generalized): Secondary | ICD-10-CM

## 2021-10-26 DIAGNOSIS — R262 Difficulty in walking, not elsewhere classified: Secondary | ICD-10-CM

## 2021-10-26 DIAGNOSIS — M5459 Other low back pain: Secondary | ICD-10-CM | POA: Diagnosis not present

## 2021-10-26 DIAGNOSIS — M79604 Pain in right leg: Secondary | ICD-10-CM

## 2021-11-01 ENCOUNTER — Ambulatory Visit: Payer: Medicare HMO | Admitting: Podiatry

## 2021-11-02 ENCOUNTER — Other Ambulatory Visit (INDEPENDENT_AMBULATORY_CARE_PROVIDER_SITE_OTHER): Payer: Self-pay | Admitting: Physician Assistant

## 2021-11-02 ENCOUNTER — Telehealth (INDEPENDENT_AMBULATORY_CARE_PROVIDER_SITE_OTHER): Payer: Self-pay | Admitting: Family Medicine

## 2021-11-02 DIAGNOSIS — E559 Vitamin D deficiency, unspecified: Secondary | ICD-10-CM

## 2021-11-02 NOTE — Telephone Encounter (Signed)
Last OV for [facility]

## 2021-11-02 NOTE — Telephone Encounter (Signed)
Patient needs a refill of Vitamin D. Please call pt at [phone]. ?

## 2021-11-02 NOTE — Telephone Encounter (Signed)
Will refill vitamin d at appointment next week.  ?

## 2021-11-02 NOTE — Telephone Encounter (Signed)
Last Ov with [facility] [place]

## 2021-11-03 NOTE — Therapy (Incomplete)
?OUTPATIENT PHYSICAL THERAPY TREATMENT NOTE ? ? ?Patient Name: Rachel Woodard ?MRN: [id number] ?DOB:[birth year], 71 y.o., female ?Today's Date: 11/03/2021 ? ?PCP: Scheryl Marten, PA ?REFERRING PROVIDER: Scheryl Marten, PA ? ?END OF SESSION:  ? ? ? ? ? ? ? ? ?Past Medical History:  ?Diagnosis Date  ? Anemia 2001  ? after gastric bypass  ? Anxiety   ? Aortic atherosclerosis ([place])   ? Arthritis   ? Back pain   ? Bilateral swelling of feet   ? Bipolar 1 disorder ([place])   ? Breast cancer ([place]) 2013  ? Breast cancer    Radiation therapy  ? Calcified granuloma of lung ([place])   ? Cigarette nicotine dependence   ? Cigarette nicotine dependence in remission   ? Cocaine dependence in remission [facility])   ? COPD (chronic obstructive pulmonary disease) ([place])   ? Depression   ? Disequilibrium   ? Fibromyalgia   ? GERD (gastroesophageal reflux disease)   ? HSV infection   ? Hypertension   ? Hypertensive kidney disease with stage 3a chronic kidney disease ([place])   ? Hypertensive kidney disease with stage 3b chronic kidney disease ([place])   ? Osteoarthritis   ? Personal history of infectious disease   ? Personal history of radiation therapy 2013  ? Pneumonia   ? Pre-diabetes   ? PTSD (post-traumatic stress disorder)   ? Seasonal allergic rhinitis due to pollen   ? Sleep apnea   ? uses CPAP  ? SOB (shortness of breath)   ? Vitamin D deficiency   ? ?Past Surgical History:  ?Procedure Laterality Date  ? ABDOMINAL HYSTERECTOMY  2018  ? BREAST LUMPECTOMY Right 2013  ? GASTRIC BYPASS  2001  ? TOTAL KNEE ARTHROPLASTY Left 12/22/2018  ? Procedure: LEFT TOTAL KNEE ARTHROPLASTY;  Surgeon: Leandrew Koyanagi, MD;  Location: [place];  Service: Orthopedics;  Laterality: Left;  ? ?Patient Active Problem List  ? Diagnosis Date Noted  ? Pain due to onychomycosis of toenails of both feet 01/20/2020  ? Shortness of breath 10/05/2019  ? Status post total left knee replacement 12/22/2018  ? MDD (major depressive disorder), recurrent episode, mild ([place])  10/29/2018  ? Autoimmune hepatitis ([place]) 10/29/2018  ? OSA (obstructive sleep apnea) 10/29/2018  ? Renal cyst, right 10/22/2018  ? Lung nodule, solitary 10/22/2018  ? Genital herpes simplex 10/29/2017  ? Vitamin D deficiency 10/29/2017  ? Class 3 severe obesity with serious comorbidity and body mass index (BMI) of 40.0 to 44.9 in adult [facility]) 10/29/2017  ? Essential hypertension 10/29/2017  ? Prediabetes 02/14/2017  ? Fibromyalgia 02/14/2017  ? Substance abuse in remission ([place]) 02/14/2017  ? Anxiety and depression 02/14/2017  ? HX: breast cancer 02/14/2017  ? Urge incontinence 11/13/2016  ? Unilateral primary osteoarthritis, left knee 06/14/2016  ? GERD (gastroesophageal reflux disease) 11/02/2015  ? Angioedema 10/24/2013  ? Tobacco abuse 10/24/2013  ? DCIS (ductal carcinoma in situ) of breast 04/07/2013  ? Depression 04/07/2013  ? ? ?REFERRING DIAG: M79.604 (ICD-10-CM) - Pain in right leg ? ?THERAPY DIAG:  ?No diagnosis found. ? ?PERTINENT HISTORY: Anxiety, depression, Bipolar 1 disorder, fibromyalgia, HTN, pre-diabetes, COPD, hx of breast cx, aortic atherosclerosis, arthritis ? ?PRECAUTIONS: None ? ?ONSET DATE: 1 month ago ? ?SUBJECTIVE: *** ? ?PAIN:  ?Are you having pain? Yes:  ?NPRS scale: 4/10 ?Pain location: Lt shoulder ?Pain description: achy, N/T ?Aggravating factors: standing > 5-10 minutes ?Relieving factors: IcyHot, muscle creams, and rest ? ? ?OBJECTIVE:  ? *Unless otherwise  noted, objective measures collected previously* ? ?DIAGNOSTIC FINDINGS:  ?09/06/2021: XR Lumbar spine 2-3 views: Moderate degenerative changes L4-5 and L5-S1 ?09/06/2021: XR Hip unilat w/ or w/o pelvis Right: Moderate degenerative changes to the hip joint ?04/20/2021: XR Knee 3 view Left:  X-rays reveal well-seated prosthesis without complication ?10/272022: XR Knee 3 view Right: Significant degenerative changes to the medial and patellofemoral  ?compartments ?  ?PATIENT SURVEYS:  ?FOTO 58%, predicted 64% in 13 visits ?10/24/2021: 75%  (Goal met) ?  ?SCREENING FOR RED FLAGS: ?Bowel or bladder incontinence: No ?Cauda equina syndrome: No ?  ?COGNITION: ?          Overall cognitive status: Within functional limits for tasks assessed               ?           ?SENSATION: ?Light touch: WFL ?  ?MUSCLE LENGTH: ?Hamstrings: WNL BIL ?Thomas test: (-) BIL ?Ober's test; (+) on Rt, (-) on Lt ?  ?POSTURE:  ?Decreased lumbar lordosis ?  ?PALPATION: ?TTP to Rt piriformis, glute max, BIL lumbar paraspinals/ QL at L3-L5, Exquisite TTP to Rt greater trochanter ?  ?PASSIVE ACCESSORIES: ?Hypomobile and painful L3-S1 ?  ?LUMBAR ROM:  ?  ?Active  A/PROM  ?09/22/2021  ?Flexion 80  ?Extension 20  ?Right lateral flexion To knee  ?Left lateral flexion To knee  ?Right rotation 80  ?Left rotation 80  ? (Blank rows = not tested) ?  ?  ?LE MMT: ?  ?MMT Right ?09/22/2021 Left ?09/22/2021 Right ?10/24/2021  ?Hip flexion 4+/5 5/5 5/5  ?Hip extension 3+/5 4/5 4+/5  ?Hip abduction 3+/5 4/5 4+/5  ?Hip adduction 4/5 4/5 5/5  ?Hip internal rotation 4+/5 5/5 5/5  ?Hip external rotation 4+/5 5/5 5/5  ?Knee flexion 5/5 5/5   ?Knee extension 5/5 5/5   ?Ankle dorsiflexion 5/5 5/5   ?Ankle plantarflexion 5/5 5/5   ?Ankle inversion 5/5 5/5   ?Ankle eversion 5/5 5/5   ? (Blank rows = not tested) ? ? ?UE MMT: ?  ?MMT Right ?10/24/2021 Left ?10/24/2021  ?Shoulder flexion 5/5 5/5  ?Shoulder abduction 5/5 4/5p!  ?Shoulder ER 4+/5 4/5p!  ?Shoulder IR 5/5 5/5  ? ?  ?LUMBAR SPECIAL TESTS:  ?Slump: (-) ?SLR: (-) ?PLE: (-) ?Gaenslen's: (-) ?Pelvic compression: (+) ?Pelvic distraction: (-) ?Thigh thrust: (-) ?FABER: (-) ?FADIR: (-) ?Piriformis test: (+) on Rt ?Trendelenburg sign: (+) BIL ? ?SHOULDER SPECIAL TESTS:  ? ?(10/24/2021) ?Hawkins-Kennedy (+) on Lt ?Neer's: (+) on Lt ?  ?  ?FUNCTIONAL TESTS:  ?5xSTS: 12.45 seconds ?TUG: 14 seconds ?Squat: x3 WNL ?Plank: 11 seconds ? ?10/17/2021: TUG: 11.81 seconds ?  ?GAIT: ?Distance walked: 60f ?Assistive device utilized: None ?Level of assistance: Complete  Independence ?Comments: Increased BIL hip adduction, increased lateral pelvic motion, decreased stride length ?  ?  ?  ?TODAY'S TREATMENT  ? ?[facility]                                                DATE: 11/04/2021 ?Therapeutic Exercise: ?*** ?Manual Therapy: ?*** ?Neuromuscular re-ed: ?*** ?Therapeutic Activity: ?*** ?Modalities: ?*** ?Self Care: ?*** ? ? ?[facility]:  DATE: 10/26/2021 ?Therapeutic Exercise: ?UBE level 1 x2 min forward/ x2 min backward while collecting subjective information ?15# kettlebell swings 3x15 ?Prone swimmers with YTB around ankles 2x20 ?Seated low rows with 30# 2x10 ?Seated high rows with 30# 2x10 ?Seated lat pull-down with 30# 2x10 ?Seated shoulder rolls 2x10 forward and backward ?Mid-range shoulder flexion isometric with GTB lateral perturbations 3x30sec BIL ?5# kettlebell waiter's carry 5x69f on Lt ?Standing shoulder horizontal adduction stretch x160m on Lt ?Manual Therapy: ?N/A ?Neuromuscular re-ed: ?N/A ?Therapeutic Activity: ?N/A ?Modalities: ?N/A ?Self Care: ?N/A ? ? ?OP[facility]ult PT Treatment:                                                DATE: 10/24/2021 ?Therapeutic Exercise: ?Standing rear delt flies with 3# cable 2x10 on Lt ?Standing shoulder cross-body adduction stretch 2x1m51mon Lt ?Standing scaption with two 3# dumbbells with back on wall and mirror biofeedback 3x10 ?Standing shoulder rolls 2x10 forward and backward ?Standing shoulder extension with two 7# cables 3x10 ?Standing arm circles with shoulder abduction from hips to overhead 2x10 ?Prone swimmers 2x20   ?Manual Therapy: ?N/A ?Neuromuscular re-ed: ?N/A ?Therapeutic Activity: ?N/A ?Modalities: ?N/A ?Self Care: ?N/A ? ? ? ?  ?  ?PATIENT EDUCATION:  ?Education details: Pt educated on probable underlying pathophysiology behind her pain presentation, POC, prognosis, FOTO, and HEP ?Person educated: Patient ?Education method: Explanation, Demonstration,  and Handouts ?Education comprehension: verbalized understanding and returned demonstration ?  ?  ?HOME EXERCISE PROGRAM: ?Access Code: [email or web address]: [email or web address] ?Date: 09/22/2021 ?Prepared by

## 2021-11-04 ENCOUNTER — Telehealth: Payer: Self-pay

## 2021-11-04 ENCOUNTER — Ambulatory Visit: Payer: Medicare HMO

## 2021-11-04 NOTE — Telephone Encounter (Signed)
Left message for pt regarding her first no show. Discussed the attendance policy and confirmed pt's next scheduled appointment. Also provided the clinic phone number. ?

## 2021-11-06 ENCOUNTER — Encounter (INDEPENDENT_AMBULATORY_CARE_PROVIDER_SITE_OTHER): Payer: Self-pay | Admitting: Nurse Practitioner

## 2021-11-06 ENCOUNTER — Ambulatory Visit (INDEPENDENT_AMBULATORY_CARE_PROVIDER_SITE_OTHER): Payer: Medicare HMO | Admitting: Nurse Practitioner

## 2021-11-06 VITALS — BP 132/77 | HR 62 | Temp 98.4°F | Ht 66.0 in | Wt 241.0 lb

## 2021-11-06 DIAGNOSIS — I10 Essential (primary) hypertension: Secondary | ICD-10-CM

## 2021-11-06 DIAGNOSIS — R7303 Prediabetes: Secondary | ICD-10-CM

## 2021-11-06 DIAGNOSIS — E559 Vitamin D deficiency, unspecified: Secondary | ICD-10-CM | POA: Diagnosis not present

## 2021-11-06 DIAGNOSIS — Z6839 Body mass index (BMI) 39.0-39.9, adult: Secondary | ICD-10-CM

## 2021-11-06 DIAGNOSIS — E669 Obesity, unspecified: Secondary | ICD-10-CM | POA: Diagnosis not present

## 2021-11-06 MED ORDER — VITAMIN D (ERGOCALCIFEROL) 1.25 MG (50000 UNIT) PO CAPS: 50000.0000 [IU] | ORAL_CAPSULE | ORAL | 0 refills | Status: DC

## 2021-11-07 ENCOUNTER — Ambulatory Visit: Payer: Medicare HMO

## 2021-11-07 DIAGNOSIS — M79604 Pain in right leg: Secondary | ICD-10-CM

## 2021-11-07 DIAGNOSIS — M6281 Muscle weakness (generalized): Secondary | ICD-10-CM

## 2021-11-07 DIAGNOSIS — R262 Difficulty in walking, not elsewhere classified: Secondary | ICD-10-CM | POA: Diagnosis not present

## 2021-11-07 DIAGNOSIS — M5459 Other low back pain: Secondary | ICD-10-CM | POA: Diagnosis not present

## 2021-11-07 NOTE — Therapy (Signed)
?OUTPATIENT PHYSICAL THERAPY TREATMENT NOTE ? ? ?Patient Name: Rachel Woodard ?MRN: [id number] ?DOB:10-15-1950, 72 y.o., female ?Today's Date: 11/07/2021 ? ?PCP: Scheryl Marten, PA ?REFERRING PROVIDER: Aundra Dubin, PA-C ? ?END OF SESSION:  ? PT End of Session - 11/07/21 1303   ? ? Visit Number 8   ? Number of Visits 17   ? Date for PT Re-Evaluation 11/17/21   ? Authorization Type [employer] MCR   ? Authorization Time Period FOTO v6, v10, KX mod v15   ? Progress Note Due on Visit 10   ? PT Start Time 1300   ? PT Stop Time 1342   ? PT Time Calculation (min) 42 min   ? Activity Tolerance Patient tolerated treatment well   ? Behavior During Therapy [facility] for tasks assessed/performed   ? ?  ?  ? ?  ? ? ? ? ? ? ? ? ?Past Medical History:  ?Diagnosis Date  ? Anemia 2001  ? after gastric bypass  ? Anxiety   ? Aortic atherosclerosis ([place])   ? Arthritis   ? Back pain   ? Bilateral swelling of feet   ? Bipolar 1 disorder ([place])   ? Breast cancer ([place]) 2013  ? Breast cancer    Radiation therapy  ? Calcified granuloma of lung ([place])   ? Cigarette nicotine dependence   ? Cigarette nicotine dependence in remission   ? Cocaine dependence in remission [facility])   ? COPD (chronic obstructive pulmonary disease) ([place])   ? Depression   ? Disequilibrium   ? Fibromyalgia   ? GERD (gastroesophageal reflux disease)   ? HSV infection   ? Hypertension   ? Hypertensive kidney disease with stage 3a chronic kidney disease ([place])   ? Hypertensive kidney disease with stage 3b chronic kidney disease ([place])   ? Osteoarthritis   ? Personal history of infectious disease   ? Personal history of radiation therapy 2013  ? Pneumonia   ? Pre-diabetes   ? PTSD (post-traumatic stress disorder)   ? Seasonal allergic rhinitis due to pollen   ? Sleep apnea   ? uses CPAP  ? SOB (shortness of breath)   ? Vitamin D deficiency   ? ?Past Surgical History:  ?Procedure Laterality Date  ? ABDOMINAL HYSTERECTOMY  2018  ? BREAST LUMPECTOMY Right 2013  ? GASTRIC BYPASS   2001  ? TOTAL KNEE ARTHROPLASTY Left 12/22/2018  ? Procedure: LEFT TOTAL KNEE ARTHROPLASTY;  Surgeon: Leandrew Koyanagi, MD;  Location: [place];  Service: Orthopedics;  Laterality: Left;  ? ?Patient Active Problem List  ? Diagnosis Date Noted  ? Pain due to onychomycosis of toenails of both feet 01/20/2020  ? Shortness of breath 10/05/2019  ? Status post total left knee replacement 12/22/2018  ? MDD (major depressive disorder), recurrent episode, mild ([place]) 10/29/2018  ? Autoimmune hepatitis ([place]) 10/29/2018  ? OSA (obstructive sleep apnea) 10/29/2018  ? Renal cyst, right 10/22/2018  ? Lung nodule, solitary 10/22/2018  ? Genital herpes simplex 10/29/2017  ? Vitamin D deficiency 10/29/2017  ? Class 3 severe obesity with serious comorbidity and body mass index (BMI) of 40.0 to 44.9 in adult [facility]) 10/29/2017  ? Essential hypertension 10/29/2017  ? Prediabetes 02/14/2017  ? Fibromyalgia 02/14/2017  ? Substance abuse in remission ([place]) 02/14/2017  ? Anxiety and depression 02/14/2017  ? HX: breast cancer 02/14/2017  ? Urge incontinence 11/13/2016  ? Unilateral primary osteoarthritis, left knee 06/14/2016  ? GERD (gastroesophageal reflux disease) 11/02/2015  ? Angioedema  10/24/2013  ? Tobacco abuse 10/24/2013  ? DCIS (ductal carcinoma in situ) of breast 04/07/2013  ? Depression 04/07/2013  ? ? ?REFERRING DIAG: M79.604 (ICD-10-CM) - Pain in right leg ? ?THERAPY DIAG:  ?Pain in right leg ? ?Difficulty in walking, not elsewhere classified ? ?Muscle weakness (generalized) ? ?Other low back pain ? ?PERTINENT HISTORY: Anxiety, depression, Bipolar 1 disorder, fibromyalgia, HTN, pre-diabetes, COPD, hx of breast cx, aortic atherosclerosis, arthritis ? ?PRECAUTIONS: None ? ?ONSET DATE: 1 month ago ? ?SUBJECTIVE: Pt reports that she woke up with increased Lt shoulder pain this morning, although this has improved since that time. ? ?PAIN:  ?Are you having pain? Yes:  ?NPRS scale: 0/10 ?Pain location: Lt shoulder ?Pain description: achy,  N/T ?Aggravating factors: standing > 5-10 minutes ?Relieving factors: IcyHot, muscle creams, and rest ? ? ?OBJECTIVE:  ? *Unless otherwise noted, objective measures collected previously* ? ?DIAGNOSTIC FINDINGS:  ?09/06/2021: XR Lumbar spine 2-3 views: Moderate degenerative changes L4-5 and L5-S1 ?09/06/2021: XR Hip unilat w/ or w/o pelvis Right: Moderate degenerative changes to the hip joint ?04/20/2021: XR Knee 3 view Left:  X-rays reveal well-seated prosthesis without complication ?10/272022: XR Knee 3 view Right: Significant degenerative changes to the medial and patellofemoral  ?compartments ?  ?PATIENT SURVEYS:  ?FOTO 58%, predicted 64% in 13 visits ?10/24/2021: 75% (Goal met) ?  ?SCREENING FOR RED FLAGS: ?Bowel or bladder incontinence: No ?Cauda equina syndrome: No ?  ?COGNITION: ?          Overall cognitive status: Within functional limits for tasks assessed               ?           ?SENSATION: ?Light touch: WFL ?  ?MUSCLE LENGTH: ?Hamstrings: WNL BIL ?Thomas test: (-) BIL ?Ober's test; (+) on Rt, (-) on Lt ?  ?POSTURE:  ?Decreased lumbar lordosis ?  ?PALPATION: ?TTP to Rt piriformis, glute max, BIL lumbar paraspinals/ QL at L3-L5, Exquisite TTP to Rt greater trochanter ?  ?PASSIVE ACCESSORIES: ?Hypomobile and painful L3-S1 ?  ?LUMBAR ROM:  ?  ?Active  A/PROM  ?09/22/2021  ?Flexion 80  ?Extension 20  ?Right lateral flexion To knee  ?Left lateral flexion To knee  ?Right rotation 80  ?Left rotation 80  ? (Blank rows = not tested) ?  ?  ?LE MMT: ?  ?MMT Right ?09/22/2021 Left ?09/22/2021 Right ?10/24/2021  ?Hip flexion 4+/5 5/5 5/5  ?Hip extension 3+/5 4/5 4+/5  ?Hip abduction 3+/5 4/5 4+/5  ?Hip adduction 4/5 4/5 5/5  ?Hip internal rotation 4+/5 5/5 5/5  ?Hip external rotation 4+/5 5/5 5/5  ?Knee flexion 5/5 5/5   ?Knee extension 5/5 5/5   ?Ankle dorsiflexion 5/5 5/5   ?Ankle plantarflexion 5/5 5/5   ?Ankle inversion 5/5 5/5   ?Ankle eversion 5/5 5/5   ? (Blank rows = not tested) ? ? ?UE MMT: ?  ?MMT Right ?10/24/2021  Left ?10/24/2021  ?Shoulder flexion 5/5 5/5  ?Shoulder abduction 5/5 4/5p!  ?Shoulder ER 4+/5 4/5p!  ?Shoulder IR 5/5 5/5  ? ?  ?LUMBAR SPECIAL TESTS:  ?Slump: (-) ?SLR: (-) ?PLE: (-) ?Gaenslen's: (-) ?Pelvic compression: (+) ?Pelvic distraction: (-) ?Thigh thrust: (-) ?FABER: (-) ?FADIR: (-) ?Piriformis test: (+) on Rt ?Trendelenburg sign: (+) BIL ? ?SHOULDER SPECIAL TESTS:  ? ?(10/24/2021) ?Hawkins-Kennedy (+) on Lt ?Neer's: (+) on Lt ?  ?  ?FUNCTIONAL TESTS:  ?5xSTS: 12.45 seconds ?TUG: 14 seconds ?Squat: x3 WNL ?Plank: 11 seconds ? ?10/17/2021: TUG: 11.81 seconds ?  ?  GAIT: ?Distance walked: 49f ?Assistive device utilized: None ?Level of assistance: Complete Independence ?Comments: Increased BIL hip adduction, increased lateral pelvic motion, decreased stride length ?  ?  ?  ?TODAY'S TREATMENT  ? ?[facility]                                                DATE: 11/07/2021 ?Therapeutic Exercise: ?Standing posterior delt fly with 3# cable 2x10 BIL ?Standing V chops with 10# cable 2x10 BIL ?Standing BIL shoulder scaption with 4# dumbbells 3x10 ?Standing shoulder rolls with 4# dumbbells 3x10 ?Seated Lt shoulder ER with 7# cable 3x10 ?Omega leg press with 55# 3x10 ?Prone swimmers with YTB around ankles 3x20 ?Manual Therapy: ?N/A ?Neuromuscular re-ed: ?N/A ?Therapeutic Activity: ?N/A ?Modalities: ?N/A ?Self Care: ?N/A ? ? ?[facility]                                                DATE: 10/26/2021 ?Therapeutic Exercise: ?UBE level 1 x2 min forward/ x2 min backward while collecting subjective information ?15# kettlebell swings 3x15 ?Prone swimmers with YTB around ankles 2x20 ?Seated low rows with 30# 2x10 ?Seated high rows with 30# 2x10 ?Seated lat pull-down with 30# 2x10 ?Seated shoulder rolls 2x10 forward and backward ?Mid-range shoulder flexion isometric with GTB lateral perturbations 3x30sec BIL ?5# kettlebell waiter's carry 5x530fon Lt ?Standing shoulder horizontal adduction stretch x1m28mon  Lt ?Manual Therapy: ?N/A ?Neuromuscular re-ed: ?N/A ?Therapeutic Activity: ?N/A ?Modalities: ?N/A ?Self Care: ?N/A ? ? ?[facility]:                                                DATE: 10/24/2021 ?T

## 2021-11-08 DIAGNOSIS — R7303 Prediabetes: Secondary | ICD-10-CM | POA: Diagnosis not present

## 2021-11-08 DIAGNOSIS — I10 Essential (primary) hypertension: Secondary | ICD-10-CM | POA: Diagnosis not present

## 2021-11-08 DIAGNOSIS — Z0000 Encounter for general adult medical examination without abnormal findings: Secondary | ICD-10-CM | POA: Diagnosis not present

## 2021-11-08 DIAGNOSIS — Z6837 Body mass index (BMI) 37.0-37.9, adult: Secondary | ICD-10-CM | POA: Diagnosis not present

## 2021-11-08 DIAGNOSIS — R030 Elevated blood-pressure reading, without diagnosis of hypertension: Secondary | ICD-10-CM | POA: Diagnosis not present

## 2021-11-08 DIAGNOSIS — M545 Low back pain, unspecified: Secondary | ICD-10-CM | POA: Diagnosis not present

## 2021-11-08 DIAGNOSIS — G8929 Other chronic pain: Secondary | ICD-10-CM | POA: Diagnosis not present

## 2021-11-08 DIAGNOSIS — Z79899 Other long term (current) drug therapy: Secondary | ICD-10-CM | POA: Diagnosis not present

## 2021-11-08 DIAGNOSIS — M797 Fibromyalgia: Secondary | ICD-10-CM | POA: Diagnosis not present

## 2021-11-08 NOTE — Progress Notes (Signed)
? ? ? ?Chief Complaint:  ? ?OBESITY ?Rachel Woodard is here to discuss her progress with her obesity treatment plan along with follow-up of her obesity related diagnoses. Rachel Woodard is on the Category 2 Plan and keeping a food journal and adhering to recommended goals of 1350-1500 calories and 90 grams of protein and states she is following her eating plan approximately 99% of the time. Rachel Woodard states she is doing physical therapy for 45 minutes 2 times per week. ? ?Today's visit was #: 47 ?Starting weight: 258 lbs ?Starting date: 03/14/2021 ?Today's weight: 241 lbs ?Today's date: 11/06/2021 ?Total lbs lost to date: 17 lbs ?Total lbs lost since last in-office visit: 5 lbs ? ?Interim History: Rachel Woodard has done well with weight loss since her last visit. She is not skipping meals. She is drinking coffee and water. She is meeting protein goals. She is still going to physical therapy 2 days per week. She has one more week of physical therapy left. Physical therapy has been very beneficial. She is going to pain management on Wednesday. She wasn't able to take [facility] due to cost.  ? ?Subjective:  ? ?1. Essential hypertension ?Rachel Woodard is taking HCTZ 25 mg and Coreg 30125 twice daily. She denies side effects chest pain, shortness of breath, and palpitations. She reports lower extremity edema over the past couple of months. She has discussed this with her primary care physician.  ? ?2. Prediabetes ?Rachel Woodard's last A1C was 6.1 on 08/23/2021. She took Metformin in the past and stopped due to side effects.  ? ?3. Vitamin D deficiency ?Rachel Woodard is taking Vitamin D 50,000 IU weekly. She denies side effects nausea, vomiting, and muscle weakness.  ? ?Assessment/Plan:  ? ?1. Essential hypertension ?Rachel Woodard will make an appointment with her primary care physician for follow up for lower extremity edema. She will continue medications as directed. She is working on healthy weight loss and exercise to improve blood pressure control. We will watch for  signs of hypotension as she continues her lifestyle modifications. ? ?2. Prediabetes ?Rachel Woodard will continue to work on weight loss, exercise, and decreasing simple carbohydrates to help decrease the risk of diabetes.  ? ?3. Vitamin D deficiency ?Low Vitamin D level contributes to fatigue and are associated with obesity, breast, and colon cancer. We will refill prescription Vitamin D 50,000 IU every week for 1 month with no refills and Rachel Woodard will follow-up for routine testing of Vitamin D, at least 2-3 times per year to avoid over-replacement. We discussed side effects. We will check last labs for Vitamin D.  ? ?- Vitamin D, Ergocalciferol, (DRISDOL) 1.25 MG (50000 UNIT) CAPS capsule; Take 1 capsule (50,000 Units total) by mouth every 7 (seven) days.  Dispense: 4 capsule; Refill: 0 ? ?4. Obesity with current BMI of 39.0 ?Rachel Woodard is currently in the action stage of change. As such, her goal is to continue with weight loss efforts. She has agreed to the Category 2 Plan and keeping a food journal and adhering to recommended goals of 1350-1500 calories and 90 grams of protein.  ? ?Exercise goals:  As is.  ? ?Behavioral modification strategies: increasing lean protein intake, increasing water intake, and no skipping meals. ? ?Rachel Woodard has agreed to follow-up with our clinic in 4 weeks. She was informed of the importance of frequent follow-up visits to maximize her success with intensive lifestyle modifications for her multiple health conditions.  ? ?Objective:  ? ?Blood pressure 132/77, pulse 62, temperature 98.4 ?F (36.9 ?C), height '5\' 6"'$  (1.676 m), weight  241 lb (109.3 kg), SpO2 99 %. ?Body mass index is 38.9 kg/m?. ? ?General: Cooperative, alert, well developed, in no acute distress. ?HEENT: Conjunctivae and lids unremarkable. ?Cardiovascular: Regular rhythm.  ?Lungs: Normal work of breathing. ?Neurologic: No focal deficits.  ? ?Lab Results  ?Component Value Date  ? CREATININE 1.15 (H) 03/14/2021  ? BUN 20 03/14/2021   ? NA 139 03/14/2021  ? K 3.9 03/14/2021  ? CL 102 03/14/2021  ? CO2 22 03/14/2021  ? ?Lab Results  ?Component Value Date  ? ALT 15 03/14/2021  ? AST 17 03/14/2021  ? ALKPHOS 94 03/14/2021  ? BILITOT 0.2 03/14/2021  ? ?Lab Results  ?Component Value Date  ? HGBA1C 6.0 (H) 03/14/2021  ? HGBA1C 5.7 (H) 11/11/2018  ? HGBA1C 5.8 (H) 04/24/2018  ? HGBA1C 5.7 (H) 01/22/2018  ? HGBA1C 5.9 07/11/2017  ? ?Lab Results  ?Component Value Date  ? INSULIN 14.4 03/14/2021  ? ?Lab Results  ?Component Value Date  ? TSH 3.320 03/14/2021  ? ?Lab Results  ?Component Value Date  ? CHOL 170 03/14/2021  ? HDL 53 03/14/2021  ? [place] 99 03/14/2021  ? TRIG 99 03/14/2021  ? CHOLHDL 3.1 07/11/2017  ? ?Lab Results  ?Component Value Date  ? VD25OH 33.0 03/14/2021  ? VD25OH 27.7 (L) 04/24/2018  ? VD25OH 23.0 (L) 01/22/2018  ? ?Lab Results  ?Component Value Date  ? WBC 7.6 03/14/2021  ? HGB 13.3 03/14/2021  ? HCT 40.5 03/14/2021  ? MCV 88 03/14/2021  ? PLT 255 03/14/2021  ? ?No results found for: IRON, TIBC, FERRITIN ? ?Obesity Behavioral Intervention:  ? ?Approximately 15 minutes were spent on the discussion below. ? ?ASK: ?We discussed the diagnosis of obesity with [place] today and Rachel Woodard agreed to give [place] permission to discuss obesity behavioral modification therapy today. ? ?ASSESS: ?Rachel Woodard has the diagnosis of obesity and her BMI today is 39.0. Rachel Woodard is in the action stage of change.  ? ?ADVISE: ?Rachel Woodard was educated on the multiple health risks of obesity as well as the benefit of weight loss to improve her health. She was advised of the need for long term treatment and the importance of lifestyle modifications to improve her current health and to decrease her risk of future health problems. ? ?AGREE: ?Multiple dietary modification options and treatment options were discussed and Rachel Woodard agreed to follow the recommendations documented in the above note. ? ?ARRANGE: ?Rachel Woodard was educated on the importance of frequent visits to treat  obesity as outlined per CMS and USPSTF guidelines and agreed to schedule her next follow up appointment today. ? ?Attestation Statements:  ? ?Reviewed by clinician on day of visit: allergies, medications, problem list, medical history, surgical history, family history, social history, and previous encounter notes. ? ?I, Lizbeth Bark, RMA, am acting as Location manager for Everardo Pacific, FNP. ? ?I have reviewed the above documentation for accuracy and completeness, and I agree with the above. Everardo Pacific, FNP  ?

## 2021-11-08 NOTE — Therapy (Signed)
OUTPATIENT PHYSICAL THERAPY TREATMENT NOTE   Patient Name: Rachel Woodard MRN: [id number] DOB:10-23-1950, 71 y.o., female Today's Date: 11/09/2021  PCP: Scheryl Marten, [place] REFERRING PROVIDER: Scheryl Marten, [place]  END OF SESSION:   PT End of Session - 11/09/21 1127     Visit Number 9    Number of Visits 17    Date for PT Re-Evaluation 11/17/21    Authorization Type [employer] MCR    Authorization Time Period FOTO v6, v10, KX mod v15    Progress Note Due on Visit 10    PT Start Time 1129    PT Stop Time 1211    PT Time Calculation (min) 42 min    Activity Tolerance Patient tolerated treatment well    Behavior During Therapy WFL for tasks assessed/performed                    Past Medical History:  Diagnosis Date   Anemia 2001   after gastric bypass   Anxiety    Aortic atherosclerosis (HCC)    Arthritis    Back pain    Bilateral swelling of feet    Bipolar 1 disorder ([place])    Breast cancer ([place]) 2013   Breast cancer    Radiation therapy   Calcified granuloma of lung (HCC)    Cigarette nicotine dependence    Cigarette nicotine dependence in remission    Cocaine dependence in remission ([place])    COPD (chronic obstructive pulmonary disease) (HCC)    Depression    Disequilibrium    Fibromyalgia    GERD (gastroesophageal reflux disease)    HSV infection    Hypertension    Hypertensive kidney disease with stage 3a chronic kidney disease ([place])    Hypertensive kidney disease with stage 3b chronic kidney disease ([place])    Osteoarthritis    Personal history of infectious disease    Personal history of radiation therapy 2013   Pneumonia    Pre-diabetes    PTSD (post-traumatic stress disorder)    Seasonal allergic rhinitis due to pollen    Sleep apnea    uses CPAP   SOB (shortness of breath)    Vitamin D deficiency    Past Surgical History:  Procedure Laterality Date   ABDOMINAL HYSTERECTOMY  2018   BREAST LUMPECTOMY Right 2013   GASTRIC BYPASS   2001   TOTAL KNEE ARTHROPLASTY Left 12/22/2018   Procedure: LEFT TOTAL KNEE ARTHROPLASTY;  Surgeon: Leandrew Koyanagi, MD;  Location: [facility];  Service: Orthopedics;  Laterality: Left;   Patient Active Problem List   Diagnosis Date Noted   Pain due to onychomycosis of toenails of both feet 01/20/2020   Shortness of breath 10/05/2019   Status post total left knee replacement 12/22/2018   MDD (major depressive disorder), recurrent episode, mild ([place]) 10/29/2018   Autoimmune hepatitis ([place]) 10/29/2018   OSA (obstructive sleep apnea) 10/29/2018   Renal cyst, right 10/22/2018   Lung nodule, solitary 10/22/2018   Genital herpes simplex 10/29/2017   Vitamin D deficiency 10/29/2017   Class 3 severe obesity with serious comorbidity and body mass index (BMI) of 40.0 to 44.9 in adult [facility]) 10/29/2017   Essential hypertension 10/29/2017   Prediabetes 02/14/2017   Fibromyalgia 02/14/2017   Substance abuse in remission ([place]) 02/14/2017   Anxiety and depression 02/14/2017   HX: breast cancer 02/14/2017   Urge incontinence 11/13/2016   Unilateral primary osteoarthritis, left knee 06/14/2016   GERD (gastroesophageal reflux disease) 11/02/2015  Angioedema 10/24/2013   Tobacco abuse 10/24/2013   DCIS (ductal carcinoma in situ) of breast 04/07/2013   Depression 04/07/2013    REFERRING DIAG: M79.604 (ICD-10-CM) - Pain in right leg  THERAPY DIAG:  Pain in right leg  Difficulty in walking, not elsewhere classified  Muscle weakness (generalized)  Other low back pain  PERTINENT HISTORY: Anxiety, depression, Bipolar 1 disorder, fibromyalgia, HTN, pre-diabetes, COPD, hx of breast cx, aortic atherosclerosis, arthritis  PRECAUTIONS: None  ONSET DATE: 1 month ago  SUBJECTIVE: Pt reports Lt shoulder pain today rated 3/10. However, she reports it was worse this morning and traveled down to her elbow. She reports doing her exercises daily. She reports that her hip has been feeling great.  PAIN:  Are  you having pain? Yes:  NPRS scale: 3/10 Pain location: Lt shoulder Pain description: achy, N/T Aggravating factors: standing > 5-10 minutes Relieving factors: IcyHot, muscle creams, and rest   OBJECTIVE:   *Unless otherwise noted, objective measures collected previously*  DIAGNOSTIC FINDINGS:  09/06/2021: XR Lumbar spine 2-3 views: Moderate degenerative changes L4-5 and L5-S1 09/06/2021: XR Hip unilat w/ or w/o pelvis Right: Moderate degenerative changes to the hip joint 04/20/2021: XR Knee 3 view Left:  X-rays reveal well-seated prosthesis without complication [id number]: XR Knee 3 view Right: Significant degenerative changes to the medial and patellofemoral  compartments   PATIENT SURVEYS:  FOTO 58%, predicted 64% in 13 visits 10/24/2021: 75% (Goal met)   SCREENING FOR RED FLAGS: Bowel or bladder incontinence: No Cauda equina syndrome: No   COGNITION:           Overall cognitive status: Within functional limits for tasks assessed                          SENSATION: Light touch: WFL   MUSCLE LENGTH: Hamstrings: WNL BIL Thomas test: (-) BIL Ober's test; (+) on Rt, (-) on Lt   POSTURE:  Decreased lumbar lordosis   PALPATION: TTP to Rt piriformis, glute max, BIL lumbar paraspinals/ QL at L3-L5, Exquisite TTP to Rt greater trochanter   PASSIVE ACCESSORIES: Hypomobile and painful L3-S1   LUMBAR ROM:    Active  A/PROM  09/22/2021  Flexion 80  Extension 20  Right lateral flexion To knee  Left lateral flexion To knee  Right rotation 80  Left rotation 80   (Blank rows = not tested)     LE MMT:   MMT Right 09/22/2021 Left 09/22/2021 Right 10/24/2021  Hip flexion 4+/5 5/5 5/5  Hip extension 3+/5 4/5 4+/5  Hip abduction 3+/5 4/5 4+/5  Hip adduction 4/5 4/5 5/5  Hip internal rotation 4+/5 5/5 5/5  Hip external rotation 4+/5 5/5 5/5  Knee flexion 5/5 5/5   Knee extension 5/5 5/5   Ankle dorsiflexion 5/5 5/5   Ankle plantarflexion 5/5 5/5   Ankle inversion 5/5 5/5    Ankle eversion 5/5 5/5    (Blank rows = not tested)   UE MMT:   MMT Right 10/24/2021 Left 10/24/2021  Shoulder flexion 5/5 5/5  Shoulder abduction 5/5 4/5p!  Shoulder ER 4+/5 4/5p!  Shoulder IR 5/5 5/5     LUMBAR SPECIAL TESTS:  Slump: (-) SLR: (-) PLE: (-) Gaenslen's: (-) Pelvic compression: (+) Pelvic distraction: (-) Thigh thrust: (-) FABER: (-) FADIR: (-) Piriformis test: (+) on Rt Trendelenburg sign: (+) BIL  SHOULDER SPECIAL TESTS:   (10/24/2021) Hawkins-Kennedy (+) on Lt Neer's: (+) on Lt     FUNCTIONAL TESTS:  5xSTS: 12.45 seconds TUG: 14 seconds Squat: x3 WNL Plank: 11 seconds  10/17/2021: TUG: 11.81 seconds   GAIT: Distance walked: 66f Assistive device utilized: None Level of assistance: Complete Independence Comments: Increased BIL hip adduction, increased lateral pelvic motion, decreased stride length       TODAY'S TREATMENT   [facility] Adult PT Treatment:                                                DATE: 11/09/2021 Therapeutic Exercise: Standing Lt shoulder ER with 7# cable 3x10 with 3-sec hold and slow, eccentric return Standing arm circles with abduction from waist to shoulder-level 3x30 seconds Standing 8# dumbbell straight arm V-lifts (double hand grip) 3x10 BIL Standing BIL shoulder scaption with 4# dumbbells 3x10 Bent-over posterior delt fly with 3# cable 2x10 BIL Bent-over 4# dumbbell Lt shoulder pendulums 2x30sec Standing cross-body adduction shoulder stretch x278m on Lt Standing BIL pec doorway stretch x2m56mSeated low rows with 35# 2x10 Seated high rows with 35# 2x10 Seated lat pull-down with 35# 2x10 Standing BIL shoulder extension with 10# cables 2x10 Manual Therapy: N/A Neuromuscular re-ed: N/A Therapeutic Activity: N/A Modalities: N/A Self Care: N/A   [facility] Adult PT Treatment:                                                DATE: 11/07/2021 Therapeutic Exercise: Standing posterior delt fly with 3# cable 2x10  BIL Standing V chops with 10# cable 2x10 BIL Standing BIL shoulder scaption with 4# dumbbells 3x10 Standing shoulder rolls with 4# dumbbells 3x10 Seated Lt shoulder ER with 7# cable 3x10 Omega leg press with 55# 3x10 Prone swimmers with YTB around ankles 3x20 Manual Therapy: N/A Neuromuscular re-ed: N/A Therapeutic Activity: N/A Modalities: N/A Self Care: N/A   [facility] Adult PT Treatment:                                                DATE: 10/26/2021 Therapeutic Exercise: UBE level 1 x2 min forward/ x2 min backward while collecting subjective information 15# kettlebell swings 3x15 Prone swimmers with YTB around ankles 2x20 Seated low rows with 30# 2x10 Seated high rows with 30# 2x10 Seated lat pull-down with 30# 2x10 Seated shoulder rolls 2x10 forward and backward Mid-range shoulder flexion isometric with GTB lateral perturbations 3x30sec BIL 5# kettlebell waiter's carry [id number] Lt Standing shoulder horizontal adduction stretch x1min51m Lt Manual Therapy: N/A Neuromuscular re-ed: N/A Therapeutic Activity: N/A Modalities: N/A Self Care: N/A        PATIENT EDUCATION:  Education details: Pt educated on probable underlying pathophysiology behind her pain presentation, POC, prognosis, FOTO, and HEP Person educated: Patient Education method: Explanation, Demonstration, and Handouts Education comprehension: verbalized understanding and returned demonstration     HOME EXERCISE PROGRAM: Access Code: [email or web address] Date: 09/22/2021 Prepared by: TuckeVanessa Durhamercises - Seated Slump Nerve Glide  - 1 x daily - 7 x weekly - 3 sets - 20 reps - Plank on Knees  - 1 x daily - 7 x weekly - 3 sets - 10 reps - Supine Piriformis  Stretch with Foot on Ground  - 1 x daily - 7 x weekly - 2 sets - 1-min hold - Standing ITB Stretch  - 1 x daily - 7 x weekly - 2 sets - 1-min hold - Supine Bridge with Resistance Band  - 1 x daily - 7 x weekly  - 3 sets - 10 reps - 3-sec hold   ASSESSMENT:   CLINICAL IMPRESSION: Pt responded well to all interventions today, demonstrating improved form and no increase in baseline pain. Despite presenting with increased baseline shoulder pain, the pt reports that exercises were therapeutic. She will continue to benefit from skilled PT to address her primary impairments and return to her prior level of function with less limitation.     OBJECTIVE IMPAIRMENTS Abnormal gait, decreased balance, decreased endurance, decreased mobility, difficulty walking, decreased ROM, decreased strength, hypomobility, impaired flexibility, improper body mechanics, postural dysfunction, and pain.    ACTIVITY LIMITATIONS cleaning, community activity, laundry, yard work, and shopping.    PERSONAL FACTORS 3+ comorbidities: See extensive medical hx  are also affecting patient's functional outcome.          GOALS: Goals reviewed with patient? Yes   SHORT TERM GOALS: Target date: 10/20/2021   Pt will report understanding and adherence to her HEP in order to promote independence in the management of her primary impairments. Baseline: HEP provided at eval 10/24/2021: Pt reports daily HEP adherence Goal status: ACHIEVED     LONG TERM GOALS: Target date: 11/17/2021   Pt will achieve a FOTO score of 64% in order to demonstrate improved functional ability as it relates to her primary LE impairments. Baseline: 58% 10/24/2021: 75% Goal status: ACHIEVED   2.  Pt will report ability to stand 20 minutes without the need for a seated rest in order to wash dishes without limitation. Baseline: 5-10 minutes 10/17/2021: Unlimited Goal status: ACHIEVED   3.  Pt will achieve BIL global hip strength of 5/5 in order to return to a regimented walking program with less limitation. Baseline: See MMT chart 10/24/2021: See updated MMT chart Goal status: PROGRESSING   4.  Pt will achieve a TUG in <12 seconds in order to demonstrate  decreased fall risk for community ambulation. Baseline: 14 seconds 10/17/2021: 11.81 seconds Goal status: ACHIEVED       PLAN: PT FREQUENCY: 2x/week   PT DURATION: 8 weeks   PLANNED INTERVENTIONS: Therapeutic exercises, Therapeutic activity, Neuromuscular re-education, Balance training, Gait training, Patient/Family education, Joint manipulation, Joint mobilization, Stair training, Aquatic Therapy, Dry Needling, Electrical stimulation, Spinal manipulation, Spinal mobilization, Cryotherapy, Taping, Vasopneumatic device, Traction, Ionotophoresis 42m/ml Dexamethasone, and Manual therapy.   PLAN FOR NEXT SESSION: Progress core/ hip strengthening, IT band stretching, leg press machine    TCherie Ouch PT 11/09/2021, 12:12 PM

## 2021-11-09 ENCOUNTER — Ambulatory Visit: Payer: Medicare HMO

## 2021-11-09 DIAGNOSIS — M79604 Pain in right leg: Secondary | ICD-10-CM

## 2021-11-09 DIAGNOSIS — M6281 Muscle weakness (generalized): Secondary | ICD-10-CM

## 2021-11-09 DIAGNOSIS — R262 Difficulty in walking, not elsewhere classified: Secondary | ICD-10-CM | POA: Diagnosis not present

## 2021-11-09 DIAGNOSIS — M5459 Other low back pain: Secondary | ICD-10-CM

## 2021-11-14 ENCOUNTER — Ambulatory Visit: Payer: Medicare HMO

## 2021-11-14 DIAGNOSIS — M6281 Muscle weakness (generalized): Secondary | ICD-10-CM

## 2021-11-14 DIAGNOSIS — M5459 Other low back pain: Secondary | ICD-10-CM

## 2021-11-14 DIAGNOSIS — M79604 Pain in right leg: Secondary | ICD-10-CM | POA: Diagnosis not present

## 2021-11-14 DIAGNOSIS — R262 Difficulty in walking, not elsewhere classified: Secondary | ICD-10-CM | POA: Diagnosis not present

## 2021-11-14 NOTE — Therapy (Signed)
OUTPATIENT PHYSICAL THERAPY TREATMENT NOTE/ PROGRESS NOTE/ DISCHARGE SUMMARY   Progress Note Reporting Period 09/22/2021 to 11/14/2021  See note below for Objective Data and Assessment of Progress/Goals.      Patient Name: Rachel Woodard MRN: [id number] DOB:[birth year], 71 y.o., female Today's Date: 11/14/2021  PCP: Scheryl Marten, [place] REFERRING PROVIDER: Aundra Dubin, PA-C  END OF SESSION:   PT End of Session - 11/14/21 1237     Visit Number 10    Date for PT Re-Evaluation 11/17/21    Authorization Type [employer] MCR    Authorization Time Period FOTO v6, v10, KX mod v15    Progress Note Due on Visit 10    PT Start Time 2725    PT Stop Time 1251    PT Time Calculation (min) 38 min    Activity Tolerance Patient tolerated treatment well    Behavior During Therapy WFL for tasks assessed/performed                     Past Medical History:  Diagnosis Date   Anemia 2001   after gastric bypass   Anxiety    Aortic atherosclerosis (HCC)    Arthritis    Back pain    Bilateral swelling of feet    Bipolar 1 disorder (HCC)    Breast cancer ([place]) 2013   Breast cancer    Radiation therapy   Calcified granuloma of lung (HCC)    Cigarette nicotine dependence    Cigarette nicotine dependence in remission    Cocaine dependence in remission ([place])    COPD (chronic obstructive pulmonary disease) (HCC)    Depression    Disequilibrium    Fibromyalgia    GERD (gastroesophageal reflux disease)    HSV infection    Hypertension    Hypertensive kidney disease with stage 3a chronic kidney disease ([place])    Hypertensive kidney disease with stage 3b chronic kidney disease ([place])    Osteoarthritis    Personal history of infectious disease    Personal history of radiation therapy 2013   Pneumonia    Pre-diabetes    PTSD (post-traumatic stress disorder)    Seasonal allergic rhinitis due to pollen    Sleep apnea    uses CPAP   SOB (shortness of breath)    Vitamin D  deficiency    Past Surgical History:  Procedure Laterality Date   ABDOMINAL HYSTERECTOMY  2018   BREAST LUMPECTOMY Right 2013   GASTRIC BYPASS  2001   TOTAL KNEE ARTHROPLASTY Left 12/22/2018   Procedure: LEFT TOTAL KNEE ARTHROPLASTY;  Surgeon: Leandrew Koyanagi, MD;  Location: [facility];  Service: Orthopedics;  Laterality: Left;   Patient Active Problem List   Diagnosis Date Noted   Pain due to onychomycosis of toenails of both feet 01/20/2020   Shortness of breath 10/05/2019   Status post total left knee replacement 12/22/2018   MDD (major depressive disorder), recurrent episode, mild ([place]) 10/29/2018   Autoimmune hepatitis ([place]) 10/29/2018   OSA (obstructive sleep apnea) 10/29/2018   Renal cyst, right 10/22/2018   Lung nodule, solitary 10/22/2018   Genital herpes simplex 10/29/2017   Vitamin D deficiency 10/29/2017   Class 3 severe obesity with serious comorbidity and body mass index (BMI) of 40.0 to 44.9 in adult [facility]) 10/29/2017   Essential hypertension 10/29/2017   Prediabetes 02/14/2017   Fibromyalgia 02/14/2017   Substance abuse in remission ([place]) 02/14/2017   Anxiety and depression 02/14/2017   HX: breast cancer 02/14/2017  Urge incontinence 11/13/2016   Unilateral primary osteoarthritis, left knee 06/14/2016   GERD (gastroesophageal reflux disease) 11/02/2015   Angioedema 10/24/2013   Tobacco abuse 10/24/2013   DCIS (ductal carcinoma in situ) of breast 04/07/2013   Depression 04/07/2013    REFERRING DIAG: M79.604 (ICD-10-CM) - Pain in right leg  THERAPY DIAG:  Pain in right leg  Difficulty in walking, not elsewhere classified  Muscle weakness (generalized)  Other low back pain  PERTINENT HISTORY: Anxiety, depression, Bipolar 1 disorder, fibromyalgia, HTN, pre-diabetes, COPD, hx of breast cx, aortic atherosclerosis, arthritis  PRECAUTIONS: None  ONSET DATE: 1 month ago  SUBJECTIVE: Pt reports having three episodes of Lt shoulder pain traveling to her Lt elbow on  Sunday lasting about 30 minutes each. She denies any chest pain, chest tightness, SOB, or scapular pain related to this problem. She reports when she has these types of episodes, they usually happen in the morning and last about 30 minutes. She reports doing her HEP about every other day.  PAIN:  Are you having pain? Yes:  NPRS scale: 0/10 Pain location: Lt shoulder Pain description: achy, N/T Aggravating factors: standing > 5-10 minutes Relieving factors: IcyHot, muscle creams, and rest   OBJECTIVE:   *Unless otherwise noted, objective measures collected previously*  DIAGNOSTIC FINDINGS:  09/06/2021: XR Lumbar spine 2-3 views: Moderate degenerative changes L4-5 and L5-S1 09/06/2021: XR Hip unilat w/ or w/o pelvis Right: Moderate degenerative changes to the hip joint 04/20/2021: XR Knee 3 view Left:  X-rays reveal well-seated prosthesis without complication [id number]: XR Knee 3 view Right: Significant degenerative changes to the medial and patellofemoral  compartments   PATIENT SURVEYS:  FOTO 58%, predicted 64% in 13 visits 10/24/2021: 75% (Goal met)   SCREENING FOR RED FLAGS: Bowel or bladder incontinence: No Cauda equina syndrome: No   COGNITION:           Overall cognitive status: Within functional limits for tasks assessed                          SENSATION: Light touch: WFL   MUSCLE LENGTH: Hamstrings: WNL BIL Thomas test: (-) BIL Ober's test; (+) on Rt, (-) on Lt   POSTURE:  Decreased lumbar lordosis   PALPATION: TTP to Rt piriformis, glute max, BIL lumbar paraspinals/ QL at L3-L5, Exquisite TTP to Rt greater trochanter   PASSIVE ACCESSORIES: Hypomobile and painful L3-S1   LUMBAR ROM:    Active  A/PROM  09/22/2021  Flexion 80  Extension 20  Right lateral flexion To knee  Left lateral flexion To knee  Right rotation 80  Left rotation 80   (Blank rows = not tested)     LE MMT:   MMT Right 09/22/2021 Left 09/22/2021 Right 10/24/2021 Right 11/14/2021  Left 11/14/2021  Hip flexion 4+/5 5/5 5/5    Hip extension 3+/5 4/5 4+/5 5/5 5/5  Hip abduction 3+/5 4/5 4+/5 5/5 5/5  Hip adduction 4/5 4/5 5/5 5/5 5/5  Hip internal rotation 4+/5 5/5 5/5    Hip external rotation 4+/5 5/5 5/5    Knee flexion 5/5 5/5     Knee extension 5/5 5/5     Ankle dorsiflexion 5/5 5/5     Ankle plantarflexion 5/5 5/5     Ankle inversion 5/5 5/5     Ankle eversion 5/5 5/5      (Blank rows = not tested)   UE MMT:   MMT Right 10/24/2021 Left 10/24/2021 Right 11/14/2021 Left  11/14/2021  Shoulder flexion 5/5 5/5    Shoulder abduction 5/5 4/5p!  5/5 p!  Shoulder ER 4+/5 4/5p! 5/5 4+/5 p!  Shoulder IR 5/5 5/5       LUMBAR SPECIAL TESTS:  Slump: (-) SLR: (-) PLE: (-) Gaenslen's: (-) Pelvic compression: (+) Pelvic distraction: (-) Thigh thrust: (-) FABER: (-) FADIR: (-) Piriformis test: (+) on Rt Trendelenburg sign: (+) BIL  SHOULDER SPECIAL TESTS:   (10/24/2021) Hawkins-Kennedy (+) on Lt Neer's: (+) on Lt     FUNCTIONAL TESTS:  5xSTS: 12.45 seconds TUG: 14 seconds Squat: x3 WNL Plank: 11 seconds  10/17/2021: TUG: 11.81 seconds   GAIT: Distance walked: 52f Assistive device utilized: None Level of assistance: Complete Independence Comments: Increased BIL hip adduction, increased lateral pelvic motion, decreased stride length       TODAY'S TREATMENT   [facility] Adult PT Treatment:                                                DATE: 11/14/2021 Therapeutic Exercise: Standing BIL shoulder scaption with YTB 3x10 with 3-sec hold Standing BIL shoulder ER with GTB 3x10 with 3-sec hold Sidelying Lt shoulder ER with 7# dumbbell 3x10 Standing low trap lift-off at wall 3x10 with 3-sec hold Standing shoulder rows with blue TB 3x10 with 3-sec hold Standing shoulder extension with blue TB 3x10 with 3-sec hold Standing shoulder rolls 2x10 forward and backward Manual Therapy: N/A Neuromuscular re-ed: N/A Therapeutic Activity: Re-assessment of objective  measures with pt education on progress made in PT Modalities: N/A Self Care: N/A   [facility]                                                [facility]: [facility] Therapeutic Exercise: Standing Lt shoulder ER with 7# cable 3x10 with 3-sec hold and slow, eccentric return Standing arm circles with abduction from waist to shoulder-level 3x30 seconds Standing 8# dumbbell straight arm V-lifts (double hand grip) 3x10 BIL Standing BIL shoulder scaption with 4# dumbbells 3x10 Bent-over posterior delt fly with 3# cable 2x10 BIL Bent-over 4# dumbbell Lt shoulder pendulums 2x30sec Standing cross-body adduction shoulder stretch x217m on Lt Standing BIL pec doorway stretch [id number] low rows with 35# 2x10 Seated high rows with 35# 2x10 Seated lat pull-down with 35# 2x10 Standing BIL shoulder extension with 10# cables 2x10 Manual Therapy: N/A Neuromuscular re-ed: N/A Therapeutic Activity: N/A Modalities: N/A Self Care: N/A   [facility] Adult PT Treatment:                                                DATE: 11/07/2021 Therapeutic Exercise: Standing posterior delt fly with 3# cable 2x10 BIL Standing V chops with 10# cable 2x10 BIL Standing BIL shoulder scaption with 4# dumbbells 3x10 Standing shoulder rolls with 4# dumbbells 3x10 Seated Lt shoulder ER with 7# cable 3x10 Omega leg press with 55# 3x10 Prone swimmers with YTB around ankles 3x20 Manual Therapy: N/A Neuromuscular re-ed: N/A Therapeutic Activity: N/A Modalities: N/A Self Care: N/A        PATIENT EDUCATION:  Education details: Pt  educated on probable underlying pathophysiology behind her pain presentation, POC, prognosis, FOTO, and HEP Person educated: Patient Education method: Explanation, Demonstration, and Handouts Education comprehension: verbalized understanding and returned demonstration     HOME EXERCISE PROGRAM: Access Code: [email or web address] URL: [email or web address] Date:  09/22/2021 Prepared by: Vanessa Cameron   Exercises - Seated Slump Nerve Glide  - 1 x daily - 7 x weekly - 3 sets - 20 reps - Plank on Knees  - 1 x daily - 7 x weekly - 3 sets - 10 reps - Supine Piriformis Stretch with Foot on Ground  - 1 x daily - 7 x weekly - 2 sets - 1-min hold - Standing ITB Stretch  - 1 x daily - 7 x weekly - 2 sets - 1-min hold - Supine Bridge with Resistance Band  - 1 x daily - 7 x weekly - 3 sets - 10 reps - 3-sec hold  Added 11/14/2021: - Sidelying Shoulder ER with Towel and Dumbbell  - 1 x daily - 7 x weekly - 3 sets - 10 reps - 3-sec hold - Shoulder External Rotation and Scapular Retraction with Resistance  - 1 x daily - 7 x weekly - 3 sets - 10 reps - 3-sec hold - Shoulder Flexion with Anterior Anchored Resistance  - 1 x daily - 7 x weekly - 3 sets - 10 reps - 3-sec hold - Low trap slides at wall with lift-off  - 1 x daily - 7 x weekly - 3 sets - 10 reps - 3-sec hold - Standing Shoulder Row with Anchored Resistance  - 1 x daily - 7 x weekly - 3 sets - 10 reps - 3-sec hold - Shoulder extension with resistance - Neutral  - 1 x daily - 7 x weekly - 3 sets - 10 reps - 3-sec hold   ASSESSMENT:   CLINICAL IMPRESSION: Pt responded well to all interventions today, demonstrating improved form and no increase in baseline pain. Upon re-assessment of objective measures, the pt has met all of her rehab goals and reports being ready to be discharged to an independent HEP. She is discharged from PT at this time with an updated HEP.     OBJECTIVE IMPAIRMENTS Abnormal gait, decreased balance, decreased endurance, decreased mobility, difficulty walking, decreased ROM, decreased strength, hypomobility, impaired flexibility, improper body mechanics, postural dysfunction, and pain.    ACTIVITY LIMITATIONS cleaning, community activity, laundry, yard work, and shopping.    PERSONAL FACTORS 3+ comorbidities: See extensive medical hx  are also affecting patient's functional outcome.           GOALS: Goals reviewed with patient? Yes   SHORT TERM GOALS: Target date: 10/20/2021   Pt will report understanding and adherence to her HEP in order to promote independence in the management of her primary impairments. Baseline: HEP provided at eval 10/24/2021: Pt reports daily HEP adherence Goal status: ACHIEVED     LONG TERM GOALS: Target date: 11/17/2021   Pt will achieve a FOTO score of 64% in order to demonstrate improved functional ability as it relates to her primary LE impairments. Baseline: 58% 10/24/2021: 75% Goal status: ACHIEVED   2.  Pt will report ability to stand 20 minutes without the need for a seated rest in order to wash dishes without limitation. Baseline: 5-10 minutes 10/17/2021: Unlimited Goal status: ACHIEVED   3.  Pt will achieve BIL global hip strength of 5/5 in order to return to a regimented walking program with less limitation. Baseline: See MMT  chart 10/24/2021: See updated MMT chart 11/14/2021: See updated MMT chart Goal status: ACHIEVED   4.  Pt will achieve a TUG in <12 seconds in order to demonstrate decreased fall risk for community ambulation. Baseline: 14 seconds 10/17/2021: 11.81 seconds Goal status: ACHIEVED       PLAN: PT FREQUENCY: 2x/week   PT DURATION: 8 weeks   PLANNED INTERVENTIONS: Therapeutic exercises, Therapeutic activity, Neuromuscular re-education, Balance training, Gait training, Patient/Family education, Joint manipulation, Joint mobilization, Stair training, Aquatic Therapy, Dry Needling, Electrical stimulation, Spinal manipulation, Spinal mobilization, Cryotherapy, Taping, Vasopneumatic device, Traction, Ionotophoresis 80m/ml Dexamethasone, and Manual therapy.   PLAN FOR NEXT SESSION: Pt is discharged from PT at this time.  PHYSICAL THERAPY DISCHARGE SUMMARY  Visits from Start of Care: 10  Current functional level related to goals / functional outcomes: Pt has met all of her functional rehab goals.    Remaining deficits: Intermittent Lt shoulder pain   Education / Equipment: HEP   Patient agrees to discharge. Patient goals were met. Patient is being discharged due to meeting the stated rehab goals.   YVanessa Phillips PT, DPT 11/14/21 12:52 PM

## 2021-11-29 DIAGNOSIS — F3341 Major depressive disorder, recurrent, in partial remission: Secondary | ICD-10-CM | POA: Diagnosis not present

## 2021-11-29 DIAGNOSIS — M797 Fibromyalgia: Secondary | ICD-10-CM | POA: Diagnosis not present

## 2021-11-29 DIAGNOSIS — F4310 Post-traumatic stress disorder, unspecified: Secondary | ICD-10-CM | POA: Diagnosis not present

## 2021-12-06 DIAGNOSIS — I10 Essential (primary) hypertension: Secondary | ICD-10-CM | POA: Diagnosis not present

## 2021-12-06 DIAGNOSIS — M797 Fibromyalgia: Secondary | ICD-10-CM | POA: Diagnosis not present

## 2021-12-06 DIAGNOSIS — Z6836 Body mass index (BMI) 36.0-36.9, adult: Secondary | ICD-10-CM | POA: Diagnosis not present

## 2021-12-06 DIAGNOSIS — M545 Low back pain, unspecified: Secondary | ICD-10-CM | POA: Diagnosis not present

## 2021-12-06 DIAGNOSIS — Z0130 Encounter for examination of blood pressure without abnormal findings: Secondary | ICD-10-CM | POA: Diagnosis not present

## 2021-12-06 DIAGNOSIS — R7303 Prediabetes: Secondary | ICD-10-CM | POA: Diagnosis not present

## 2021-12-06 DIAGNOSIS — N1831 Chronic kidney disease, stage 3a: Secondary | ICD-10-CM | POA: Diagnosis not present

## 2021-12-06 DIAGNOSIS — Z79899 Other long term (current) drug therapy: Secondary | ICD-10-CM | POA: Diagnosis not present

## 2021-12-06 DIAGNOSIS — G8929 Other chronic pain: Secondary | ICD-10-CM | POA: Diagnosis not present

## 2021-12-07 ENCOUNTER — Encounter (INDEPENDENT_AMBULATORY_CARE_PROVIDER_SITE_OTHER): Payer: Self-pay | Admitting: Nurse Practitioner

## 2021-12-07 ENCOUNTER — Ambulatory Visit (INDEPENDENT_AMBULATORY_CARE_PROVIDER_SITE_OTHER): Payer: Medicare HMO | Admitting: Nurse Practitioner

## 2021-12-07 VITALS — BP 101/69 | HR 72 | Temp 98.2°F | Ht 66.0 in | Wt 235.0 lb

## 2021-12-07 DIAGNOSIS — E559 Vitamin D deficiency, unspecified: Secondary | ICD-10-CM | POA: Diagnosis not present

## 2021-12-07 DIAGNOSIS — I10 Essential (primary) hypertension: Secondary | ICD-10-CM

## 2021-12-07 DIAGNOSIS — E669 Obesity, unspecified: Secondary | ICD-10-CM

## 2021-12-07 DIAGNOSIS — Z6838 Body mass index (BMI) 38.0-38.9, adult: Secondary | ICD-10-CM | POA: Diagnosis not present

## 2021-12-07 MED ORDER — VITAMIN D (ERGOCALCIFEROL) 1.25 MG (50000 UNIT) PO CAPS: 50000.0000 [IU] | ORAL_CAPSULE | ORAL | 0 refills | Status: DC

## 2021-12-11 NOTE — Progress Notes (Signed)
Chief Complaint:   OBESITY Rachel Woodard is here to discuss her progress with her obesity treatment plan along with follow-up of her obesity related diagnoses. Rachel Woodard is on the Category 3 Plan and states she is following her eating plan approximately 95% of the time. Rachel Woodard states she is walking 60 minutes 2 times per week.  Today's visit was #: 12 Starting weight: 258 lbs Starting date: 03/14/2021 Today's weight: 235 lbs Today's date: 12/07/2021 Total lbs lost to date: 23 lbs Total lbs lost since last in-office visit: 6  Interim History: Rachel Woodard has done well with weight loss since her last visit. She is eating more salad kits and adding chicken for protein. She is not skipping meals but still struggles with hunger. She was unable to take [facility] due to cost. Reports some cravings.  She is drinking water daily. She went to pain management yesterday.  Subjective:   1. Vitamin D deficiency [place] is currently taking prescription Vit D 50,000 IU once a week. Denies any nausea, vomiting or muscle weakness.  2. Essential hypertension Rachel Woodard is currently taking HCTZ 25 mg and Coreg 3.125 mg. denies any chest pain,shortness of breath or palpitations and any side effects.  Assessment/Plan:   1. Vitamin D deficiency We will refill Vit D 50,000 IU once a week for 1 month with 0 refills. Side effects discussed.  Rachel Woodard would like to hold off on labs today.  Low Vitamin D level contributes to fatigue and are associated with obesity, breast, and colon cancer. She agrees to continue to take prescription Vitamin D '@50'$ ,000 IU every week and will follow-up for routine testing of Vitamin D, at least 2-3 times per year to avoid over-replacement.   -Refill Vitamin D, Ergocalciferol, (DRISDOL) 1.25 MG (50000 UNIT) CAPS capsule; Take 1 capsule (50,000 Units total) by mouth every 7 (seven) days.  Dispense: 4 capsule; Refill: 0  2. Essential hypertension Rachel Woodard will follow up with her PCP and  continue medications as directed.  Rachel Woodard is working on healthy weight loss and exercise to improve blood pressure control. We will watch for signs of hypotension as she continues her lifestyle modifications.   3. Obesity with current BMI of 38.1 Rachel Woodard is currently in the action stage of change. As such, her goal is to continue with weight loss efforts. She has agreed to the Category 3 Plan.   Exercise goals: As is.  Behavioral modification strategies: increasing lean protein intake, increasing water intake, and planning for success.  Rachel Woodard has agreed to follow-up with our clinic in 4 weeks. She was informed of the importance of frequent follow-up visits to maximize her success with intensive lifestyle modifications for her multiple health conditions.   Objective:   Blood pressure 101/69, pulse 72, temperature 98.2 F (36.8 C), height '5\' 6"'$  (1.676 m), weight 235 lb (106.6 kg), SpO2 97 %. Body mass index is 37.93 kg/m.  General: Cooperative, alert, well developed, in no acute distress. HEENT: Conjunctivae and lids unremarkable. Cardiovascular: Regular rhythm.  Lungs: Normal work of breathing. Neurologic: No focal deficits.   Lab Results  Component Value Date   CREATININE 1.15 (H) 03/14/2021   BUN 20 03/14/2021   NA 139 03/14/2021   K 3.9 03/14/2021   CL 102 03/14/2021   CO2 22 03/14/2021   Lab Results  Component Value Date   ALT 15 03/14/2021   AST 17 03/14/2021   ALKPHOS 94 03/14/2021   BILITOT 0.2 03/14/2021   Lab Results  Component Value Date  HGBA1C 6.0 (H) 03/14/2021   HGBA1C 5.7 (H) 11/11/2018   HGBA1C 5.8 (H) 04/24/2018   HGBA1C 5.7 (H) 01/22/2018   HGBA1C 5.9 07/11/2017   Lab Results  Component Value Date   INSULIN 14.4 03/14/2021   Lab Results  Component Value Date   TSH 3.320 03/14/2021   Lab Results  Component Value Date   CHOL 170 03/14/2021   HDL 53 03/14/2021   LDLCALC 99 03/14/2021   TRIG 99 03/14/2021   CHOLHDL 3.1 07/11/2017    Lab Results  Component Value Date   VD25OH 33.0 03/14/2021   VD25OH 27.7 (L) 04/24/2018   VD25OH 23.0 (L) 01/22/2018   Lab Results  Component Value Date   WBC 7.6 03/14/2021   HGB 13.3 03/14/2021   HCT 40.5 03/14/2021   MCV 88 03/14/2021   PLT 255 03/14/2021   No results found for: "IRON", "TIBC", "FERRITIN"  Attestation Statements:   Reviewed by clinician on day of visit: allergies, medications, problem list, medical history, surgical history, family history, social history, and previous encounter notes.  I, Brendell Tyus, RMA, am acting as transcriptionist for Everardo Pacific, FNP..  I have reviewed the above documentation for accuracy and completeness, and I agree with the above. Everardo Pacific, FNP

## 2021-12-18 DIAGNOSIS — B009 Herpesviral infection, unspecified: Secondary | ICD-10-CM | POA: Diagnosis not present

## 2021-12-18 DIAGNOSIS — S31819A Unspecified open wound of right buttock, initial encounter: Secondary | ICD-10-CM | POA: Diagnosis not present

## 2022-01-03 ENCOUNTER — Encounter (INDEPENDENT_AMBULATORY_CARE_PROVIDER_SITE_OTHER): Payer: Self-pay | Admitting: Nurse Practitioner

## 2022-01-03 ENCOUNTER — Ambulatory Visit (INDEPENDENT_AMBULATORY_CARE_PROVIDER_SITE_OTHER): Payer: Medicare HMO | Admitting: Nurse Practitioner

## 2022-01-03 VITALS — BP 110/68 | HR 64 | Temp 98.3°F | Ht 66.0 in | Wt 240.0 lb

## 2022-01-03 DIAGNOSIS — E669 Obesity, unspecified: Secondary | ICD-10-CM | POA: Diagnosis not present

## 2022-01-03 DIAGNOSIS — E559 Vitamin D deficiency, unspecified: Secondary | ICD-10-CM

## 2022-01-03 DIAGNOSIS — Z6838 Body mass index (BMI) 38.0-38.9, adult: Secondary | ICD-10-CM | POA: Diagnosis not present

## 2022-01-03 MED ORDER — VITAMIN D (ERGOCALCIFEROL) 1.25 MG (50000 UNIT) PO CAPS: 50000.0000 [IU] | ORAL_CAPSULE | ORAL | 0 refills | Status: DC

## 2022-01-04 DIAGNOSIS — N1831 Chronic kidney disease, stage 3a: Secondary | ICD-10-CM | POA: Diagnosis not present

## 2022-01-04 DIAGNOSIS — R7303 Prediabetes: Secondary | ICD-10-CM | POA: Diagnosis not present

## 2022-01-04 DIAGNOSIS — M545 Low back pain, unspecified: Secondary | ICD-10-CM | POA: Diagnosis not present

## 2022-01-04 DIAGNOSIS — Z6836 Body mass index (BMI) 36.0-36.9, adult: Secondary | ICD-10-CM | POA: Diagnosis not present

## 2022-01-04 DIAGNOSIS — G8929 Other chronic pain: Secondary | ICD-10-CM | POA: Diagnosis not present

## 2022-01-04 DIAGNOSIS — R030 Elevated blood-pressure reading, without diagnosis of hypertension: Secondary | ICD-10-CM | POA: Diagnosis not present

## 2022-01-04 DIAGNOSIS — M1711 Unilateral primary osteoarthritis, right knee: Secondary | ICD-10-CM | POA: Diagnosis not present

## 2022-01-04 DIAGNOSIS — M797 Fibromyalgia: Secondary | ICD-10-CM | POA: Diagnosis not present

## 2022-01-04 DIAGNOSIS — I10 Essential (primary) hypertension: Secondary | ICD-10-CM | POA: Diagnosis not present

## 2022-01-04 NOTE — Progress Notes (Signed)
Chief Complaint:   OBESITY Rachel Woodard is here to discuss her progress with her obesity treatment plan along with follow-up of her obesity related diagnoses. Rachel Woodard is on the Category 3 Plan and states she is following her eating plan approximately 98% of the time. Rachel Woodard states she is walking 60 minutes 2 times per week.  Today's visit was #: 26 Starting weight: 258 lbs Starting date: 03/14/2021 Today's weight: 240 lbs Today's date: 01/03/2022 Total lbs lost to date: 18 lbs Total lbs lost since last in-office visit: 0  Interim History: Rachel Woodard is shocked she gained weight. Reports she has been "eating good". She is not weighing or measuring food. Notes some restriction when she eats. Some days she does not have an appetite. She's skipping meals, drinking water and diet sodas. She is eating more fruit and eating sugar free cookies.  Subjective:   1. Vitamin D deficiency Rachel Woodard is currently taking prescription Vit D 50,000 IU once a week. Denies any nausea, vomiting or muscle weakness.  Assessment/Plan:   1. Vitamin D deficiency We will refill Vit D 50,000 IU once a week for 1 month with 0 refills. Side effects discussed.   -Refill Vitamin D, Ergocalciferol, (DRISDOL) 1.25 MG (50000 UNIT) CAPS capsule; Take 1 capsule (50,000 Units total) by mouth every 7 (seven) days.  Dispense: 4 capsule; Refill: 0  2. Obesity with current BMI of 38.8 Rachel Woodard is currently in the action stage of change. As such, her goal is to continue with weight loss efforts. She has agreed to the Category 3 Plan.   Rachel Woodard is to start journaling and weighing food to evaluate portion sizes. Start protein shake for breakfast since she's skipping meals. Also stop carbonated drinks. Will obtain labs in1-2 months.  Exercise goals: As is.  Behavioral modification strategies: increasing lean protein intake, increasing water intake, planning for success, and keeping a strict food journal.  Rachel Woodard has agreed to  follow-up with our clinic in 4 weeks. She was informed of the importance of frequent follow-up visits to maximize her success with intensive lifestyle modifications for her multiple health conditions.   Objective:   Blood pressure 110/68, pulse 64, temperature 98.3 F (36.8 C), height '5\' 6"'$  (1.676 m), weight 240 lb (108.9 kg), SpO2 99 %. Body mass index is 38.74 kg/m.  General: Cooperative, alert, well developed, in no acute distress. HEENT: Conjunctivae and lids unremarkable. Cardiovascular: Regular rhythm.  Lungs: Normal work of breathing. Neurologic: No focal deficits.   Lab Results  Component Value Date   CREATININE 1.15 (H) 03/14/2021   BUN 20 03/14/2021   NA 139 03/14/2021   K 3.9 03/14/2021   CL 102 03/14/2021   CO2 22 03/14/2021   Lab Results  Component Value Date   ALT 15 03/14/2021   AST 17 03/14/2021   ALKPHOS 94 03/14/2021   BILITOT 0.2 03/14/2021   Lab Results  Component Value Date   HGBA1C 6.0 (H) 03/14/2021   HGBA1C 5.7 (H) 11/11/2018   HGBA1C 5.8 (H) 04/24/2018   HGBA1C 5.7 (H) 01/22/2018   HGBA1C 5.9 07/11/2017   Lab Results  Component Value Date   INSULIN 14.4 03/14/2021   Lab Results  Component Value Date   TSH 3.320 03/14/2021   Lab Results  Component Value Date   CHOL 170 03/14/2021   HDL 53 03/14/2021   LDLCALC 99 03/14/2021   TRIG 99 03/14/2021   CHOLHDL 3.1 07/11/2017   Lab Results  Component Value Date   VD25OH 33.0 03/14/2021  VD25OH 27.7 (L) 04/24/2018   VD25OH 23.0 (L) 01/22/2018   Lab Results  Component Value Date   WBC 7.6 03/14/2021   HGB 13.3 03/14/2021   HCT 40.5 03/14/2021   MCV 88 03/14/2021   PLT 255 03/14/2021   No results found for: "IRON", "TIBC", "FERRITIN"  Attestation Statements:   Reviewed by clinician on day of visit: allergies, medications, problem list, medical history, surgical history, family history, social history, and previous encounter notes.  I, Brendell Tyus, RMA, am acting as  transcriptionist for Everardo Pacific, FNP.  I have reviewed the above documentation for accuracy and completeness, and I agree with the above. Everardo Pacific, FNP

## 2022-01-24 ENCOUNTER — Ambulatory Visit (INDEPENDENT_AMBULATORY_CARE_PROVIDER_SITE_OTHER): Payer: Medicare HMO | Admitting: Nurse Practitioner

## 2022-01-26 ENCOUNTER — Other Ambulatory Visit: Payer: Medicare HMO

## 2022-01-31 ENCOUNTER — Encounter (INDEPENDENT_AMBULATORY_CARE_PROVIDER_SITE_OTHER): Payer: Self-pay

## 2022-02-05 ENCOUNTER — Encounter (INDEPENDENT_AMBULATORY_CARE_PROVIDER_SITE_OTHER): Payer: Self-pay

## 2022-02-05 ENCOUNTER — Ambulatory Visit (INDEPENDENT_AMBULATORY_CARE_PROVIDER_SITE_OTHER): Payer: Medicare HMO | Admitting: Nurse Practitioner

## 2022-02-05 DIAGNOSIS — I10 Essential (primary) hypertension: Secondary | ICD-10-CM | POA: Diagnosis not present

## 2022-02-05 DIAGNOSIS — M545 Low back pain, unspecified: Secondary | ICD-10-CM | POA: Diagnosis not present

## 2022-02-05 DIAGNOSIS — Z0130 Encounter for examination of blood pressure without abnormal findings: Secondary | ICD-10-CM | POA: Diagnosis not present

## 2022-02-05 DIAGNOSIS — G8929 Other chronic pain: Secondary | ICD-10-CM | POA: Diagnosis not present

## 2022-02-05 DIAGNOSIS — M797 Fibromyalgia: Secondary | ICD-10-CM | POA: Diagnosis not present

## 2022-02-05 DIAGNOSIS — Z6837 Body mass index (BMI) 37.0-37.9, adult: Secondary | ICD-10-CM | POA: Diagnosis not present

## 2022-02-05 DIAGNOSIS — R7303 Prediabetes: Secondary | ICD-10-CM | POA: Diagnosis not present

## 2022-02-05 DIAGNOSIS — M1711 Unilateral primary osteoarthritis, right knee: Secondary | ICD-10-CM | POA: Diagnosis not present

## 2022-02-05 DIAGNOSIS — Z79899 Other long term (current) drug therapy: Secondary | ICD-10-CM | POA: Diagnosis not present

## 2022-02-05 DIAGNOSIS — N1831 Chronic kidney disease, stage 3a: Secondary | ICD-10-CM | POA: Diagnosis not present

## 2022-02-06 ENCOUNTER — Other Ambulatory Visit: Payer: Medicare HMO

## 2022-02-13 ENCOUNTER — Other Ambulatory Visit: Payer: Self-pay | Admitting: Cardiology

## 2022-02-13 ENCOUNTER — Inpatient Hospital Stay: Admission: RE | Admit: 2022-02-13 | Payer: Medicare HMO | Source: Ambulatory Visit

## 2022-02-13 DIAGNOSIS — R072 Precordial pain: Secondary | ICD-10-CM

## 2022-02-20 ENCOUNTER — Encounter (INDEPENDENT_AMBULATORY_CARE_PROVIDER_SITE_OTHER): Payer: Self-pay | Admitting: Nurse Practitioner

## 2022-02-20 ENCOUNTER — Ambulatory Visit (INDEPENDENT_AMBULATORY_CARE_PROVIDER_SITE_OTHER): Payer: Medicare HMO | Admitting: Nurse Practitioner

## 2022-02-20 VITALS — BP 122/78 | HR 77 | Temp 99.4°F | Ht 66.0 in | Wt 238.0 lb

## 2022-02-20 DIAGNOSIS — Z6838 Body mass index (BMI) 38.0-38.9, adult: Secondary | ICD-10-CM

## 2022-02-20 DIAGNOSIS — E559 Vitamin D deficiency, unspecified: Secondary | ICD-10-CM | POA: Diagnosis not present

## 2022-02-20 DIAGNOSIS — E669 Obesity, unspecified: Secondary | ICD-10-CM | POA: Diagnosis not present

## 2022-02-20 MED ORDER — VITAMIN D (ERGOCALCIFEROL) 1.25 MG (50000 UNIT) PO CAPS: 50000.0000 [IU] | ORAL_CAPSULE | ORAL | 0 refills | Status: DC

## 2022-02-22 DIAGNOSIS — J449 Chronic obstructive pulmonary disease, unspecified: Secondary | ICD-10-CM | POA: Diagnosis not present

## 2022-02-22 DIAGNOSIS — R1310 Dysphagia, unspecified: Secondary | ICD-10-CM | POA: Diagnosis not present

## 2022-02-22 DIAGNOSIS — I10 Essential (primary) hypertension: Secondary | ICD-10-CM | POA: Diagnosis not present

## 2022-02-22 DIAGNOSIS — M797 Fibromyalgia: Secondary | ICD-10-CM | POA: Diagnosis not present

## 2022-02-27 ENCOUNTER — Ambulatory Visit
Admission: RE | Admit: 2022-02-27 | Discharge: 2022-02-27 | Disposition: A | Discharge status: Home / Self Care | Payer: No Typology Code available for payment source | Source: Ambulatory Visit | Attending: Cardiology | Admitting: Cardiology

## 2022-02-27 DIAGNOSIS — I700 Atherosclerosis of aorta: Secondary | ICD-10-CM | POA: Diagnosis not present

## 2022-02-27 DIAGNOSIS — R072 Precordial pain: Secondary | ICD-10-CM

## 2022-02-27 NOTE — Progress Notes (Signed)
Chief Complaint:   OBESITY Rachel Woodard is here to discuss her progress with her obesity treatment plan along with follow-up of her obesity related diagnoses. Rachel Woodard is on the Category 3 Plan and states she is following her eating plan approximately 90% of the time. Rachel Woodard states she is walking 15 minutes 1 times per week.  Today's visit was #: 14 Starting weight: 258 lbs Starting date: 03/14/2021 Today's weight: 238 lbs Today's date: 02/20/2022 Total lbs lost to date: 20 lbs Total lbs lost since last in-office visit: 2 lbs  Interim History: Has done well with weight loss since her last visit.  Has a new great grandson since her last visit. She is not journaling, weighing meat or measuring her vegetables.  Not skipping meals.  Snacks: cookies, chips, drinking water, tea and fairlife.  Rarely a diet soda.  Was unable to take [facility] due to cost.  Goal weight <200 lbs.  Highest weight 236 lbs.   Subjective:   1. Vitamin D deficiency She is currently taking prescription vitamin D 50,000 IU each week. She denies nausea, vomiting or muscle weakness.  Assessment/Plan:   1. Vitamin D deficiency Low Vitamin D level contributes to fatigue and are associated with obesity, breast, and colon cancer. She agrees to continue to take prescription Vitamin D '@50'$ ,000 IU every week and will follow-up for routine testing of Vitamin D, at least 2-3 times per year to avoid over-replacement.   Refill - Vitamin D, Ergocalciferol, (DRISDOL) 1.25 MG (50000 UNIT) CAPS capsule; Take 1 capsule (50,000 Units total) by mouth every 7 (seven) days.  Dispense: 4 capsule; Refill: 0 Side effects discussed.  2. Obesity with current BMI of 38.5 Needs bariatric labs in September, wants to discuss with PCP.    Rachel Woodard is currently in the action stage of change. As such, her goal is to continue with weight loss efforts. She has agreed to the Category 3 Plan.   Exercise goals:  Plans to start walking more and using  resistance bands.   Behavioral modification strategies: increasing lean protein intake, increasing vegetables, and increasing water intake.  Rachel Woodard has agreed to follow-up with our clinic in 3 weeks. She was informed of the importance of frequent follow-up visits to maximize her success with intensive lifestyle modifications for her multiple health conditions.   Objective:   Blood pressure 122/78, pulse 77, temperature 99.4 F (37.4 C), height '5\' 6"'$  (1.676 m), weight 238 lb (108 kg), SpO2 100 %. Body mass index is 38.41 kg/m.  General: Cooperative, alert, well developed, in no acute distress. HEENT: Conjunctivae and lids unremarkable. Cardiovascular: Regular rhythm.  Lungs: Normal work of breathing. Neurologic: No focal deficits.   Lab Results  Component Value Date   CREATININE 1.15 (H) 03/14/2021   BUN 20 03/14/2021   NA 139 03/14/2021   K 3.9 03/14/2021   CL 102 03/14/2021   CO2 22 03/14/2021   Lab Results  Component Value Date   ALT 15 03/14/2021   AST 17 03/14/2021   ALKPHOS 94 03/14/2021   BILITOT 0.2 03/14/2021   Lab Results  Component Value Date   HGBA1C 6.0 (H) 03/14/2021   HGBA1C 5.7 (H) 11/11/2018   HGBA1C 5.8 (H) 04/24/2018   HGBA1C 5.7 (H) 01/22/2018   HGBA1C 5.9 07/11/2017   Lab Results  Component Value Date   INSULIN 14.4 03/14/2021   Lab Results  Component Value Date   TSH 3.320 03/14/2021   Lab Results  Component Value Date   CHOL 170  03/14/2021   HDL 53 03/14/2021   LDLCALC 99 03/14/2021   TRIG 99 03/14/2021   CHOLHDL 3.1 07/11/2017   Lab Results  Component Value Date   VD25OH 33.0 03/14/2021   VD25OH 27.7 (L) 04/24/2018   VD25OH 23.0 (L) 01/22/2018   Lab Results  Component Value Date   WBC 7.6 03/14/2021   HGB 13.3 03/14/2021   HCT 40.5 03/14/2021   MCV 88 03/14/2021   PLT 255 03/14/2021   No results found for: "IRON", "TIBC", "FERRITIN"  Obesity Behavioral Intervention:   Approximately 15 minutes were spent on the  discussion below.  ASK: We discussed the diagnosis of obesity with Rachel Woodard today and Rachel Woodard agreed to give [place] permission to discuss obesity behavioral modification therapy today.  ASSESS: Rachel Woodard has the diagnosis of obesity and her BMI today is 38.5. Rachel Woodard is in the action stage of change.   ADVISE: Rachel Woodard was educated on the multiple health risks of obesity as well as the benefit of weight loss to improve her health. She was advised of the need for long term treatment and the importance of lifestyle modifications to improve her current health and to decrease her risk of future health problems.  AGREE: Multiple dietary modification options and treatment options were discussed and Rachel Woodard agreed to follow the recommendations documented in the above note.  ARRANGE: Rachel Woodard was educated on the importance of frequent visits to treat obesity as outlined per CMS and USPSTF guidelines and agreed to schedule her next follow up appointment today.  Attestation Statements:   Reviewed by clinician on day of visit: allergies, medications, problem list, medical history, surgical history, family history, social history, and previous encounter notes.  I, Davy Pique, RMA, am acting as transcriptionist for Everardo Pacific, FNP  I have reviewed the above documentation for accuracy and completeness, and I agree with the above. Everardo Pacific, FNP

## 2022-03-01 ENCOUNTER — Telehealth: Payer: Self-pay

## 2022-03-03 NOTE — Telephone Encounter (Signed)
Spoke to patient regarding the results - she is made aware of the 61m pulmonary nodule and given her history she should have a repeat CT scan 3 months and discuss the findings w/ PCP and coordinate the care. Will defer additional testing regarding nodule to her PCP / pulmonary medicine.   Medical Assistance:  Please send a copy of the report to PCP for reference so they can help [place] her care.   Dr. TTerri Skains

## 2022-03-03 NOTE — Telephone Encounter (Signed)
Coronary calcium Score 02/27/2022:  1. Coronary calcium score is 0 and this is at percentile 0 for patients of the same age, gender and ethnicity.  2. Aortic Atherosclerosis (ICD10-I70.0).  3. 1.2 cm well-circumscribed pulmonary nodule at the right lung base with probable internal fat. This is suggestive for a benign hamartoma. Based on history of breast cancer and tobacco use, recommend follow-up chest CT in 3 months to ensure stability.  4. Small hiatal hernia. Postoperative changes compatible with a gastric bypass procedure.

## 2022-03-06 NOTE — Telephone Encounter (Signed)
Patient's report was faxed through Epic to patient's PCP

## 2022-03-08 DIAGNOSIS — Z0130 Encounter for examination of blood pressure without abnormal findings: Secondary | ICD-10-CM | POA: Diagnosis not present

## 2022-03-08 DIAGNOSIS — G8929 Other chronic pain: Secondary | ICD-10-CM | POA: Diagnosis not present

## 2022-03-08 DIAGNOSIS — I10 Essential (primary) hypertension: Secondary | ICD-10-CM | POA: Diagnosis not present

## 2022-03-08 DIAGNOSIS — M797 Fibromyalgia: Secondary | ICD-10-CM | POA: Diagnosis not present

## 2022-03-08 DIAGNOSIS — M545 Low back pain, unspecified: Secondary | ICD-10-CM | POA: Diagnosis not present

## 2022-03-08 DIAGNOSIS — M1711 Unilateral primary osteoarthritis, right knee: Secondary | ICD-10-CM | POA: Diagnosis not present

## 2022-03-08 DIAGNOSIS — R7303 Prediabetes: Secondary | ICD-10-CM | POA: Diagnosis not present

## 2022-03-08 DIAGNOSIS — N1831 Chronic kidney disease, stage 3a: Secondary | ICD-10-CM | POA: Diagnosis not present

## 2022-03-08 DIAGNOSIS — Z6836 Body mass index (BMI) 36.0-36.9, adult: Secondary | ICD-10-CM | POA: Diagnosis not present

## 2022-03-08 DIAGNOSIS — Z79899 Other long term (current) drug therapy: Secondary | ICD-10-CM | POA: Diagnosis not present

## 2022-03-09 DIAGNOSIS — Z79899 Other long term (current) drug therapy: Secondary | ICD-10-CM | POA: Diagnosis not present

## 2022-03-13 ENCOUNTER — Encounter: Payer: Self-pay | Admitting: Pulmonary Disease

## 2022-03-13 ENCOUNTER — Ambulatory Visit (INDEPENDENT_AMBULATORY_CARE_PROVIDER_SITE_OTHER): Payer: Medicare HMO | Admitting: Pulmonary Disease

## 2022-03-13 VITALS — BP 112/70 | HR 56 | Ht 66.0 in | Wt 245.6 lb

## 2022-03-13 DIAGNOSIS — G4733 Obstructive sleep apnea (adult) (pediatric): Secondary | ICD-10-CM | POA: Diagnosis not present

## 2022-03-13 NOTE — Patient Instructions (Signed)
Referral for evaluation for an oral device  Not able to tolerate CPAP for mild obstructive sleep apnea  Tentative follow-up in 6 months

## 2022-03-13 NOTE — Progress Notes (Signed)
Rachel Woodard    [id number]    1950/09/09  Primary Care Physician:Miller, Bennetta [place], [place]  Referring Physician: Scheryl Marten, [place],  [place] [address]  Chief complaint:   History of obstructive sleep apnea History of chronic obstructive pulmonary disease  HPI:  Trying to stay active  Unable to tolerate CPAP   Outpatient Encounter Medications as of 03/13/2022  Medication Sig   Blood Pressure Monitor DEVI Use as directed to check home blood pressure 2-3 times a week   calcium carbonate (OS-CAL) 600 MG TABS tablet Take 600 mg by mouth 2 (two) times daily with a meal.   carvedilol (COREG) 3.125 MG tablet TAKE 1 TABLET (3.125 MG TOTAL) BY MOUTH 2 (TWO) TIMES DAILY WITH A MEAL.   cholecalciferol (VITAMIN D3) 25 MCG (1000 UNIT) tablet Take 1,000 Units by mouth daily.   escitalopram (LEXAPRO) 10 MG tablet Take 10 mg by mouth at bedtime.   gabapentin (NEURONTIN) 100 MG capsule Take 100 mg by mouth 3 (three) times daily.   gentamicin cream (GARAMYCIN) 0.1 % Apply 1 application. topically 2 (two) times daily.   hydrochlorothiazide (HYDRODIURIL) 25 MG tablet TAKE 1 TABLET (25 MG TOTAL) BY MOUTH DAILY.   HYDROcodone-acetaminophen (NORCO/VICODIN) 5-325 MG tablet Take by mouth.   lamoTRIgine (LAMICTAL) 200 MG tablet Take 200 mg by mouth daily.   lamoTRIgine (LAMICTAL) 25 MG tablet Take 25 mg by mouth daily.   pantoprazole (PROTONIX) 40 MG tablet Take 40 mg by mouth every morning.   QUEtiapine (SEROQUEL) 200 MG tablet Take 200 mg by mouth at bedtime.   sucralfate (CARAFATE) 1 g tablet 1 tablet on an empty stomach   valACYclovir (VALTREX) 500 MG tablet Take 500 mg by mouth 2 (two) times daily.   vitamin C (ASCORBIC ACID) 500 MG tablet Take 500 mg by mouth daily.   Vitamin D, Ergocalciferol, (DRISDOL) 1.25 MG (50000 UNIT) CAPS capsule Take 1 capsule (50,000 Units total) by mouth every 7 (seven) days.   No facility-administered encounter  medications on file as of 03/13/2022.    Allergies as of 03/13/2022 - Review Complete 02/20/2022  Allergen Reaction Noted   Klonopin [clonazepam] Anaphylaxis 10/05/2019   Lisinopril Anaphylaxis 11/28/2015   Metformin and related Diarrhea 12/15/2018    Past Medical History:  Diagnosis Date   Anemia 2001   after gastric bypass   Anxiety    Aortic atherosclerosis (HCC)    Arthritis    Back pain    Bilateral swelling of feet    Bipolar 1 disorder (HCC)    Breast cancer ([place]) 2013   Breast cancer    Radiation therapy   Calcified granuloma of lung (HCC)    Cigarette nicotine dependence    Cigarette nicotine dependence in remission    Cocaine dependence in remission ([place])    COPD (chronic obstructive pulmonary disease) (HCC)    Depression    Disequilibrium    Fibromyalgia    GERD (gastroesophageal reflux disease)    HSV infection    Hypertension    Hypertensive kidney disease with stage 3a chronic kidney disease ([place])    Hypertensive kidney disease with stage 3b chronic kidney disease ([place])    Osteoarthritis    Personal history of infectious disease    Personal history of radiation therapy 2013   Pneumonia    Pre-diabetes    PTSD (post-traumatic stress disorder)    Seasonal allergic rhinitis due to pollen  Sleep apnea    uses CPAP   SOB (shortness of breath)    Vitamin D deficiency     Past Surgical History:  Procedure Laterality Date   ABDOMINAL HYSTERECTOMY  2018   BREAST LUMPECTOMY Right 2013   GASTRIC BYPASS  2001   TOTAL KNEE ARTHROPLASTY Left 12/22/2018   Procedure: LEFT TOTAL KNEE ARTHROPLASTY;  Surgeon: Leandrew Koyanagi, MD;  Location: [facility];  Service: Orthopedics;  Laterality: Left;    Family History  Problem Relation Age of Onset   Leukemia Mother    Prostate cancer Father        stage IV   Liver disease Brother    Diabetes Other    Cancer Other    Migraines Neg Hx    Headache Neg Hx     Social History   Socioeconomic History   Marital status:  Widowed    Spouse name: Not on file   Number of children: 3   Years of education: 2 yrs college   Highest education level: Not on file  Occupational History   Occupation: Retired  Tobacco Use   Smoking status: Every Day    Packs/day: 0.50    Years: 20.00    Total pack years: 10.00    Types: Cigarettes    Start date: 06/26/1999   Smokeless tobacco: Never   Tobacco comments:    trying to quit  Vaping Use   Vaping Use: Never used  Substance and Sexual Activity   Alcohol use: Not Currently    Comment: seldom   Drug use: No    Types: Cocaine    Comment: clean x 60 days on 11/28/15   Sexual activity: Yes  Other Topics Concern   Not on file  Social History Narrative   Widowed   3 children   Lives at home alone   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      Caffeine:1-2 cups/day   Right handed   Social Determinants of Health   Financial Resource Strain: Not on file  Food Insecurity: Not on file  Transportation Needs: Not on file  Physical Activity: Inactive (12/29/2018)   Exercise Vital Sign    Days of Exercise per Week: 0 days    Minutes of Exercise per Session: 0 min  Stress: No Stress Concern Present (12/29/2018)   [place] of Stress : Not at all  Social Connections: Unknown (12/29/2018)   Social Connection and Isolation Panel [NHANES]    Frequency of Communication with Friends and Family: Not on file    Frequency of Social Gatherings with Friends and Family: Not on file    Attends Religious Services: Not on file    Active Member of Clubs or Organizations: Not on file    Attends Archivist Meetings: Not on file    Marital Status: Widowed  Intimate Partner Violence: Not on file    Review of Systems  Constitutional: Negative.   HENT: Negative.    Eyes: Negative.   Respiratory:  Positive for apnea, cough and shortness of breath. Negative for wheezing.   Cardiovascular: Negative.    Gastrointestinal: Negative.   Psychiatric/Behavioral:  Positive for sleep disturbance.     There were no vitals filed for this visit.  Physical Exam Constitutional:      Appearance: She is well-developed.  HENT:     Head: Normocephalic and atraumatic.  Eyes:     General:  Right eye: No discharge.        Left eye: No discharge.     Conjunctiva/sclera: Conjunctivae normal.     Pupils: Pupils are equal, round, and reactive to light.  Neck:     Thyroid: No thyromegaly.     Trachea: No tracheal deviation.  Cardiovascular:     Rate and Rhythm: Normal rate and regular rhythm.  Pulmonary:     Effort: Pulmonary effort is normal. No respiratory distress.     Breath sounds: Normal breath sounds. No wheezing.  Musculoskeletal:     Cervical back: Normal range of motion and neck supple.   Data Reviewed:  Recent polysomnogram revealed mild obstructive sleep apnea Patient was started on auto CPAP  Assessment:   Chronic obstructive pulmonary disease with stable symptoms  Obstructive sleep apnea -Unable to tolerate CPAP -Consider oral device    Plan/Recommendations:  Follow-up for evaluation for an oral device for sleep apnea  Still remains unable to tolerate CPAP   Sherrilyn Rist MD [facility] 03/13/2022, 3:09 PM  CC: Scheryl Marten, [place]

## 2022-03-14 ENCOUNTER — Ambulatory Visit (INDEPENDENT_AMBULATORY_CARE_PROVIDER_SITE_OTHER): Payer: Medicare HMO | Admitting: Nurse Practitioner

## 2022-03-14 ENCOUNTER — Encounter (INDEPENDENT_AMBULATORY_CARE_PROVIDER_SITE_OTHER): Payer: Self-pay | Admitting: Nurse Practitioner

## 2022-03-14 VITALS — BP 117/74 | HR 61 | Temp 98.2°F | Ht 66.0 in | Wt 242.0 lb

## 2022-03-14 DIAGNOSIS — G4733 Obstructive sleep apnea (adult) (pediatric): Secondary | ICD-10-CM | POA: Diagnosis not present

## 2022-03-14 DIAGNOSIS — Z6839 Body mass index (BMI) 39.0-39.9, adult: Secondary | ICD-10-CM | POA: Diagnosis not present

## 2022-03-14 DIAGNOSIS — E669 Obesity, unspecified: Secondary | ICD-10-CM

## 2022-03-14 DIAGNOSIS — E559 Vitamin D deficiency, unspecified: Secondary | ICD-10-CM | POA: Diagnosis not present

## 2022-03-14 DIAGNOSIS — F418 Other specified anxiety disorders: Secondary | ICD-10-CM | POA: Diagnosis not present

## 2022-03-14 DIAGNOSIS — E66813 Obesity, class 3: Secondary | ICD-10-CM

## 2022-03-14 MED ORDER — VITAMIN D (ERGOCALCIFEROL) 1.25 MG (50000 UNIT) PO CAPS: 50000.0000 [IU] | ORAL_CAPSULE | ORAL | 0 refills | Status: DC

## 2022-03-15 DIAGNOSIS — R7303 Prediabetes: Secondary | ICD-10-CM | POA: Diagnosis not present

## 2022-03-15 DIAGNOSIS — M545 Low back pain, unspecified: Secondary | ICD-10-CM | POA: Diagnosis not present

## 2022-03-15 DIAGNOSIS — Z96652 Presence of left artificial knee joint: Secondary | ICD-10-CM | POA: Diagnosis not present

## 2022-03-15 DIAGNOSIS — M25512 Pain in left shoulder: Secondary | ICD-10-CM | POA: Diagnosis not present

## 2022-03-15 DIAGNOSIS — N1831 Chronic kidney disease, stage 3a: Secondary | ICD-10-CM | POA: Diagnosis not present

## 2022-03-15 DIAGNOSIS — M797 Fibromyalgia: Secondary | ICD-10-CM | POA: Diagnosis not present

## 2022-03-15 DIAGNOSIS — Z0130 Encounter for examination of blood pressure without abnormal findings: Secondary | ICD-10-CM | POA: Diagnosis not present

## 2022-03-15 DIAGNOSIS — Z6837 Body mass index (BMI) 37.0-37.9, adult: Secondary | ICD-10-CM | POA: Diagnosis not present

## 2022-03-15 DIAGNOSIS — M1711 Unilateral primary osteoarthritis, right knee: Secondary | ICD-10-CM | POA: Diagnosis not present

## 2022-03-15 NOTE — Progress Notes (Signed)
Chief Complaint:   OBESITY Rachel Woodard is here to discuss her progress with her obesity treatment plan along with follow-up of her obesity related diagnoses. Rachel Woodard is on the Category 3 Plan and states she is following her eating plan approximately 90% of the time. Rachel Woodard states she is walking 30 minutes 2 times per week.  Today's visit was #: 15 Starting weight: 258 lbs Starting date: 03/14/2021 Today's weight: 242 lbs Today's date: 03/14/2022 Total lbs lost to date: 16 lbs Total lbs lost since last in-office visit: 0  Interim History: Rachel Woodard was happy that her weight was not higher. Plans to start seeing a therapist. Denies stress eating. Is struggling with some depression. Denies suicidal thoughts.  Subjective:   1. Vitamin D deficiency Rachel Woodard is currently taking prescription Vit D 50,000 IU once a week. Denies any nausea, vomiting or muscle weakness. Reports having labs done a couple of weeks ago. Was within normal limits per patient report.  2. OSA (obstructive sleep apnea) Rachel Woodard saw pulmonary yesterday. Not using CPAP. Plans to refer for evaluation for oral dental device for sleep apnea.  3. Depression with anxiety Rachel Woodard is asking for referral today.  Assessment/Plan:   1. Vitamin D deficiency We will refill Vit D 50,000 IU once weekly for 1 month with 0 refills. Side effects discussed.   Low Vitamin D level contributes to fatigue and are associated with obesity, breast, and colon cancer. She agrees to continue to take prescription Vitamin D '@50'$ ,000 IU every week and will follow-up for routine testing of Vitamin D, at least 2-3 times per year to avoid over-replacement.   -Refill Vitamin D, Ergocalciferol, (DRISDOL) 1.25 MG (50000 UNIT) CAPS capsule; Take 1 capsule (50,000 Units total) by mouth every 7 (seven) days.  Dispense: 4 capsule; Refill: 0  2. OSA (obstructive sleep apnea) Continue to follow up with pulmonary.  3. Depression with anxiety Referral  placed.  - Ambulatory referral to Psychiatry  4. Obesity with current BMI of 39.2 Rachel Woodard is currently in the action stage of change. As such, her goal is to continue with weight loss efforts. She has agreed to the Category 3 Plan.   Exercise goals: As is.  Behavioral modification strategies: increasing lean protein intake, increasing water intake, and planning for success.  Rachel Woodard has agreed to follow-up with our clinic in 3 weeks. She was informed of the importance of frequent follow-up visits to maximize her success with intensive lifestyle modifications for her multiple health conditions.   Objective:   Blood pressure 117/74, pulse 61, temperature 98.2 F (36.8 C), height '5\' 6"'$  (1.676 m), weight 242 lb (109.8 kg), SpO2 99 %. Body mass index is 39.06 kg/m.  General: Cooperative, alert, well developed, in no acute distress. HEENT: Conjunctivae and lids unremarkable. Cardiovascular: Regular rhythm.  Lungs: Normal work of breathing. Neurologic: No focal deficits.   Lab Results  Component Value Date   CREATININE 1.15 (H) 03/14/2021   BUN 20 03/14/2021   NA 139 03/14/2021   K 3.9 03/14/2021   CL 102 03/14/2021   CO2 22 03/14/2021   Lab Results  Component Value Date   ALT 15 03/14/2021   AST 17 03/14/2021   ALKPHOS 94 03/14/2021   BILITOT 0.2 03/14/2021   Lab Results  Component Value Date   HGBA1C 6.0 (H) 03/14/2021   HGBA1C 5.7 (H) 11/11/2018   HGBA1C 5.8 (H) 04/24/2018   HGBA1C 5.7 (H) 01/22/2018   HGBA1C 5.9 07/11/2017   Lab Results  Component Value Date  INSULIN 14.4 03/14/2021   Lab Results  Component Value Date   TSH 3.320 03/14/2021   Lab Results  Component Value Date   CHOL 170 03/14/2021   HDL 53 03/14/2021   LDLCALC 99 03/14/2021   TRIG 99 03/14/2021   CHOLHDL 3.1 07/11/2017   Lab Results  Component Value Date   VD25OH 33.0 03/14/2021   VD25OH 27.7 (L) 04/24/2018   VD25OH 23.0 (L) 01/22/2018   Lab Results  Component Value Date   WBC  7.6 03/14/2021   HGB 13.3 03/14/2021   HCT 40.5 03/14/2021   MCV 88 03/14/2021   PLT 255 03/14/2021   No results found for: "IRON", "TIBC", "FERRITIN"  Attestation Statements:   Reviewed by clinician on day of visit: allergies, medications, problem list, medical history, surgical history, family history, social history, and previous encounter notes.  I, Brendell Tyus, RMA, am acting as transcriptionist for Everardo Pacific, FNP.  I have reviewed the above documentation for accuracy and completeness, and I agree with the above. Everardo Pacific, FNP

## 2022-03-16 ENCOUNTER — Encounter: Payer: Self-pay | Admitting: Pulmonary Disease

## 2022-03-16 NOTE — Progress Notes (Signed)
Per Dr. Terri Skains please schedule patient and Echo and a follow up afterwards

## 2022-03-19 ENCOUNTER — Other Ambulatory Visit: Payer: Self-pay

## 2022-03-19 DIAGNOSIS — R0609 Other forms of dyspnea: Secondary | ICD-10-CM

## 2022-03-19 DIAGNOSIS — R072 Precordial pain: Secondary | ICD-10-CM

## 2022-03-30 DIAGNOSIS — M7542 Impingement syndrome of left shoulder: Secondary | ICD-10-CM | POA: Diagnosis not present

## 2022-04-04 ENCOUNTER — Ambulatory Visit (INDEPENDENT_AMBULATORY_CARE_PROVIDER_SITE_OTHER): Payer: Medicare HMO | Admitting: Nurse Practitioner

## 2022-04-04 ENCOUNTER — Encounter: Payer: Self-pay | Admitting: Nurse Practitioner

## 2022-04-04 VITALS — BP 118/76 | HR 61 | Temp 98.1°F | Ht 66.0 in | Wt 239.0 lb

## 2022-04-04 DIAGNOSIS — Z6838 Body mass index (BMI) 38.0-38.9, adult: Secondary | ICD-10-CM

## 2022-04-04 DIAGNOSIS — E559 Vitamin D deficiency, unspecified: Secondary | ICD-10-CM

## 2022-04-04 DIAGNOSIS — E669 Obesity, unspecified: Secondary | ICD-10-CM

## 2022-04-05 DIAGNOSIS — F4312 Post-traumatic stress disorder, chronic: Secondary | ICD-10-CM | POA: Diagnosis not present

## 2022-04-05 DIAGNOSIS — F3130 Bipolar disorder, current episode depressed, mild or moderate severity, unspecified: Secondary | ICD-10-CM | POA: Diagnosis not present

## 2022-04-05 DIAGNOSIS — F411 Generalized anxiety disorder: Secondary | ICD-10-CM | POA: Diagnosis not present

## 2022-04-05 DIAGNOSIS — Z79899 Other long term (current) drug therapy: Secondary | ICD-10-CM | POA: Diagnosis not present

## 2022-04-09 DIAGNOSIS — I10 Essential (primary) hypertension: Secondary | ICD-10-CM | POA: Diagnosis not present

## 2022-04-09 NOTE — Progress Notes (Unsigned)
Chief Complaint:   OBESITY Rachel Woodard is here to discuss her progress with her obesity treatment plan along with follow-up of her obesity related diagnoses. Rachel Woodard is on the Category 3 Plan and states she is following her eating plan approximately 90% of the time. Rachel Woodard states she is walking and lifting weights 10-20 minutes 2 times per week.  Today's visit was #: 23 Starting weight: 258 lbs Starting date: 03/14/2021 Today's weight: 239 lbs Today's date: 04/04/2022 Total lbs lost to date: 19 lbs Total lbs lost since last in-office visit: 3  Interim History: Rachel Woodard has done weill with weight loss since her last visit. Overall doing some better. Plans to [facility]. Has been coloring and walking more. Her goal is to walk daily. Not skipping meals.  She is trying to get on a time schedule and trying not to eat late at night. She is drinking more water.  Subjective:   1. Vitamin D deficiency Rachel Woodard is currently taking prescription Vit D 50,000 IU once a week. Denies any nausea, vomiting or muscle weakness.  Assessment/Plan:   1. Vitamin D deficiency Continue Vit D 50,000 IU once weekly. She is having labs checked at PCP office next week.  Low Vitamin D level contributes to fatigue and are associated with obesity, breast, and colon cancer. She agrees to continue to take prescription Vitamin D '@50'$ ,000 IU every week and will follow-up for routine testing of Vitamin D, at least 2-3 times per year to avoid over-replacement.   2. Obesity with current BMI of 38.6 Rachel Woodard is currently in the action stage of change. As such, her goal is to continue with weight loss efforts. She has agreed to the Category 3 Plan.   Seeing PCP next week for follow up and labs.   Exercise goals: All adults should avoid inactivity. Some physical activity is better than none, and adults who participate in any amount of physical activity gain some health  benefits.  Behavioral modification strategies: increasing lean protein intake, increasing water intake, and planning for success.  Rachel Woodard has agreed to follow-up with our clinic in 3 weeks. She was informed of the importance of frequent follow-up visits to maximize her success with intensive lifestyle modifications for her multiple health conditions.   Objective:   Blood pressure 118/76, pulse 61, temperature 98.1 F (36.7 C), temperature source Oral, height '5\' 6"'$  (1.676 m), weight 239 lb (108.4 kg), SpO2 99 %. Body mass index is 38.58 kg/m.  General: Cooperative, alert, well developed, in no acute distress. HEENT: Conjunctivae and lids unremarkable. Cardiovascular: Regular rhythm.  Lungs: Normal work of breathing. Neurologic: No focal deficits.   Lab Results  Component Value Date   CREATININE 1.15 (H) 03/14/2021   BUN 20 03/14/2021   NA 139 03/14/2021   K 3.9 03/14/2021   CL 102 03/14/2021   CO2 22 03/14/2021   Lab Results  Component Value Date   ALT 15 03/14/2021   AST 17 03/14/2021   ALKPHOS 94 03/14/2021   BILITOT 0.2 03/14/2021   Lab Results  Component Value Date   HGBA1C 6.0 (H) 03/14/2021   HGBA1C 5.7 (H) 11/11/2018   HGBA1C 5.8 (H) 04/24/2018   HGBA1C 5.7 (H) 01/22/2018   HGBA1C 5.9 07/11/2017   Lab Results  Component Value Date   INSULIN 14.4 03/14/2021   Lab Results  Component Value Date   TSH 3.320 03/14/2021   Lab Results  Component Value Date   CHOL 170 03/14/2021  HDL 53 03/14/2021   LDLCALC 99 03/14/2021   TRIG 99 03/14/2021   CHOLHDL 3.1 07/11/2017   Lab Results  Component Value Date   VD25OH 33.0 03/14/2021   VD25OH 27.7 (L) 04/24/2018   VD25OH 23.0 (L) 01/22/2018   Lab Results  Component Value Date   WBC 7.6 03/14/2021   HGB 13.3 03/14/2021   HCT 40.5 03/14/2021   MCV 88 03/14/2021   PLT 255 03/14/2021   No results found for: "IRON", "TIBC", "FERRITIN"  Attestation Statements:   Reviewed by clinician on day of visit:  allergies, medications, problem list, medical history, surgical history, family history, social history, and previous encounter notes.  I spent 30 minutes with the patient and reviewing her chart before and after her visit.  I, Brendell Tyus, RMA, am acting as transcriptionist for Everardo Pacific, FNP.  I have reviewed the above documentation for accuracy and completeness, and I agree with the above. Everardo Pacific, FNP

## 2022-04-13 DIAGNOSIS — Z23 Encounter for immunization: Secondary | ICD-10-CM | POA: Diagnosis not present

## 2022-04-13 DIAGNOSIS — F3341 Major depressive disorder, recurrent, in partial remission: Secondary | ICD-10-CM | POA: Diagnosis not present

## 2022-04-19 DIAGNOSIS — F17210 Nicotine dependence, cigarettes, uncomplicated: Secondary | ICD-10-CM | POA: Diagnosis not present

## 2022-04-19 DIAGNOSIS — F3130 Bipolar disorder, current episode depressed, mild or moderate severity, unspecified: Secondary | ICD-10-CM | POA: Diagnosis not present

## 2022-04-19 DIAGNOSIS — F411 Generalized anxiety disorder: Secondary | ICD-10-CM | POA: Diagnosis not present

## 2022-04-19 DIAGNOSIS — F4312 Post-traumatic stress disorder, chronic: Secondary | ICD-10-CM | POA: Diagnosis not present

## 2022-04-19 DIAGNOSIS — G4730 Sleep apnea, unspecified: Secondary | ICD-10-CM | POA: Diagnosis not present

## 2022-04-19 DIAGNOSIS — R7303 Prediabetes: Secondary | ICD-10-CM | POA: Diagnosis not present

## 2022-04-25 ENCOUNTER — Ambulatory Visit (INDEPENDENT_AMBULATORY_CARE_PROVIDER_SITE_OTHER): Payer: Medicare HMO | Admitting: Nurse Practitioner

## 2022-04-25 ENCOUNTER — Encounter: Payer: Self-pay | Admitting: Nurse Practitioner

## 2022-04-25 VITALS — BP 132/69 | HR 77 | Temp 98.2°F | Ht 66.0 in | Wt 241.0 lb

## 2022-04-25 DIAGNOSIS — E559 Vitamin D deficiency, unspecified: Secondary | ICD-10-CM

## 2022-04-25 DIAGNOSIS — G4733 Obstructive sleep apnea (adult) (pediatric): Secondary | ICD-10-CM

## 2022-04-25 DIAGNOSIS — Z6838 Body mass index (BMI) 38.0-38.9, adult: Secondary | ICD-10-CM | POA: Diagnosis not present

## 2022-04-25 DIAGNOSIS — E669 Obesity, unspecified: Secondary | ICD-10-CM | POA: Diagnosis not present

## 2022-04-25 DIAGNOSIS — Z6841 Body Mass Index (BMI) 40.0 and over, adult: Secondary | ICD-10-CM

## 2022-04-25 MED ORDER — VITAMIN D (ERGOCALCIFEROL) 1.25 MG (50000 UNIT) PO CAPS: 50000.0000 [IU] | ORAL_CAPSULE | ORAL | 0 refills | Status: DC

## 2022-04-27 DIAGNOSIS — F339 Major depressive disorder, recurrent, unspecified: Secondary | ICD-10-CM | POA: Diagnosis not present

## 2022-04-27 DIAGNOSIS — F1421 Cocaine dependence, in remission: Secondary | ICD-10-CM | POA: Diagnosis not present

## 2022-04-30 NOTE — Progress Notes (Signed)
Chief Complaint:   OBESITY Rachel Woodard is here to discuss her progress with her obesity treatment plan along with follow-up of her obesity related diagnoses. Rachel Woodard is on the Category 3 Plan and states she is following her eating plan approximately 90% of the time. Rachel Woodard states she is walking 60 minutes 4 times per week.  Today's visit was #: 17 Starting weight: 258 lbs Starting date: 03/14/2021 Today's weight: could not obtain Today's date: 04/25/2022 Total lbs lost to date: ? Total lbs lost since last in-office visit: ?  Interim History: Arlina was unable to obtain strip due to her wearing pantyhose. Took Abilify for 12 days since her last visit and stopped due to side effects. Her Lexapro was increased to 20 mg. Was told that is she did not do well with Abilify she would start her on Wellbutrin. Struggling with polyphagia and cravings. [facility]  Subjective:   1. Vitamin D deficiency Rachel Woodard is currently taking prescription Vit D 50,000 IU once a week. Denies any nausea, vomiting or muscle weakness. Reports having labs with PCP's office since last visit.  2. OSA (obstructive sleep apnea) Plans to get oral dental device. Has appointment on Nov 21st to pick up device.  Assessment/Plan:   1. Vitamin D deficiency We will refill Vit D 50,000 IU once  a week for 1 month with 0 refills. Low Vitamin D level contributes to fatigue and are associated with obesity, breast, and colon cancer. She agrees to continue to take prescription Vitamin D '@50'$ ,000 IU every week and will follow-up for routine testing of Vitamin D, at least 2-3 times per year to avoid over-replacement.   -Refill Vitamin D, Ergocalciferol, (DRISDOL) 1.25 MG (50000 UNIT) CAPS capsule; Take 1 capsule (50,000 Units total) by mouth every 7 (seven) days.  Dispense: 4 capsule; Refill: 0  2. OSA (obstructive sleep apnea) Continue to follow up with pulmonary.  3. Obesity  with current BMI of 38.6 Rachel Woodard is currently in the action stage of change. As such, her goal is to continue with weight loss efforts. She has agreed to the Category 3 Plan.   Exercise goals: As is.  Behavioral modification strategies: increasing water intake, no skipping meals, and travel eating strategies.  Rachel Woodard has agreed to follow-up with our clinic in 3 weeks. She was informed of the importance of frequent follow-up visits to maximize her success with intensive lifestyle modifications for her multiple health conditions.   Objective:   Blood pressure 132/69, pulse 77, temperature 98.2 F (36.8 C), temperature source Oral, height '5\' 6"'$  (1.676 m), weight 241 lb (109.3 kg), SpO2 100 %. Body mass index is 38.9 kg/m.  General: Cooperative, alert, well developed, in no acute distress. HEENT: Conjunctivae and lids unremarkable. Cardiovascular: Regular rhythm.  Lungs: Normal work of breathing. Neurologic: No focal deficits.   Lab Results  Component Value Date   CREATININE 1.15 (H) 03/14/2021   BUN 20 03/14/2021   NA 139 03/14/2021   K 3.9 03/14/2021   CL 102 03/14/2021   CO2 22 03/14/2021   Lab Results  Component Value Date   ALT 15 03/14/2021   AST 17 03/14/2021   ALKPHOS 94 03/14/2021   BILITOT 0.2 03/14/2021   Lab Results  Component Value Date   HGBA1C 6.0 (H) 03/14/2021   HGBA1C 5.7 (H) 11/11/2018   HGBA1C 5.8 (H) 04/24/2018   HGBA1C 5.7 (H) 01/22/2018   HGBA1C 5.9 07/11/2017   Lab Results  Component Value Date   INSULIN 14.4 03/14/2021   Lab Results  Component Value Date   TSH 3.320 03/14/2021   Lab Results  Component Value Date   CHOL 170 03/14/2021   HDL 53 03/14/2021   LDLCALC 99 03/14/2021   TRIG 99 03/14/2021   CHOLHDL 3.1 07/11/2017   Lab Results  Component Value Date   VD25OH 33.0 03/14/2021   VD25OH 27.7 (L) 04/24/2018   VD25OH 23.0 (L) 01/22/2018   Lab Results  Component Value Date   WBC 7.6 03/14/2021   HGB 13.3 03/14/2021   HCT  40.5 03/14/2021   MCV 88 03/14/2021   PLT 255 03/14/2021   No results found for: "IRON", "TIBC", "FERRITIN"  Attestation Statements:   Reviewed by clinician on day of visit: allergies, medications, problem list, medical history, surgical history, family history, social history, and previous encounter notes.  I, Brendell Tyus, RMA, am acting as transcriptionist for Everardo Pacific, FNP.  I have reviewed the above documentation for accuracy and completeness, and I agree with the above. Everardo Pacific, FNP

## 2022-05-01 DIAGNOSIS — F339 Major depressive disorder, recurrent, unspecified: Secondary | ICD-10-CM | POA: Diagnosis not present

## 2022-05-08 DIAGNOSIS — F1421 Cocaine dependence, in remission: Secondary | ICD-10-CM | POA: Diagnosis not present

## 2022-05-08 DIAGNOSIS — F339 Major depressive disorder, recurrent, unspecified: Secondary | ICD-10-CM | POA: Diagnosis not present

## 2022-05-10 DIAGNOSIS — F3130 Bipolar disorder, current episode depressed, mild or moderate severity, unspecified: Secondary | ICD-10-CM | POA: Diagnosis not present

## 2022-05-10 DIAGNOSIS — F4312 Post-traumatic stress disorder, chronic: Secondary | ICD-10-CM | POA: Diagnosis not present

## 2022-05-10 DIAGNOSIS — F17210 Nicotine dependence, cigarettes, uncomplicated: Secondary | ICD-10-CM | POA: Diagnosis not present

## 2022-05-10 DIAGNOSIS — F411 Generalized anxiety disorder: Secondary | ICD-10-CM | POA: Diagnosis not present

## 2022-05-10 DIAGNOSIS — G4730 Sleep apnea, unspecified: Secondary | ICD-10-CM | POA: Diagnosis not present

## 2022-05-10 DIAGNOSIS — R7303 Prediabetes: Secondary | ICD-10-CM | POA: Diagnosis not present

## 2022-05-11 DIAGNOSIS — M7542 Impingement syndrome of left shoulder: Secondary | ICD-10-CM | POA: Diagnosis not present

## 2022-05-15 DIAGNOSIS — F1421 Cocaine dependence, in remission: Secondary | ICD-10-CM | POA: Diagnosis not present

## 2022-05-15 DIAGNOSIS — F339 Major depressive disorder, recurrent, unspecified: Secondary | ICD-10-CM | POA: Diagnosis not present

## 2022-05-16 ENCOUNTER — Ambulatory Visit (INDEPENDENT_AMBULATORY_CARE_PROVIDER_SITE_OTHER): Payer: Medicare HMO | Admitting: Nurse Practitioner

## 2022-05-16 ENCOUNTER — Encounter: Payer: Self-pay | Admitting: Nurse Practitioner

## 2022-05-16 VITALS — BP 110/76 | HR 67 | Temp 98.2°F | Ht 66.0 in | Wt 246.0 lb

## 2022-05-16 DIAGNOSIS — E559 Vitamin D deficiency, unspecified: Secondary | ICD-10-CM

## 2022-05-16 DIAGNOSIS — Z6839 Body mass index (BMI) 39.0-39.9, adult: Secondary | ICD-10-CM | POA: Diagnosis not present

## 2022-05-16 DIAGNOSIS — R6889 Other general symptoms and signs: Secondary | ICD-10-CM | POA: Diagnosis not present

## 2022-05-16 DIAGNOSIS — E669 Obesity, unspecified: Secondary | ICD-10-CM

## 2022-05-16 MED ORDER — VITAMIN D (ERGOCALCIFEROL) 1.25 MG (50000 UNIT) PO CAPS: 50000.0000 [IU] | ORAL_CAPSULE | ORAL | 0 refills | Status: DC

## 2022-05-16 NOTE — Patient Instructions (Signed)
What is a GLP-1 Glucagon like peptide-1 (GLP-1) agonists represent a class of medications used to treat type 2 diabetes mellitus and obesity.  GLP-1 medications mimic the action of a hormone called glucagon like peptide 1.  When blood sugar levels start to rise/increase these drugs stimulate the body to produce more insulin.  When that happens, the extra insulin helps to lower the blood sugar levels in the body.  This in returns helps with decreasing cravings.  These medications also slow the movement of food from the stomach into the small intestine.  This in return helps one to full faster and longer.   Diabetic medications: Approved for treatment of diabetes mellitus but does not have full approval for weight loss use Victoza (liraglutide) Ozempic (semaglutide) Mounjaro Trulicity Rybelsus  Weight loss medications: (medicare doesn't cover weight loss meds) Approved for long-term weight loss use.        Saxenda (liraglutide) Wegovy (semaglutide) Zepbound Contraindications:  Pancreatitis (active gallstones) Medullary thyroid cancer High triglycerides (>500)-will need labs prior to starting Multiple Endocrine Neoplasia syndrome type 2 (MEN 2) Trying to get pregnant Breastfeeding Use with caution with taking insulin or sulfonylureas (will need to monitor blood sugars for hypoglycemia) Side effects (most common): Most common side effects are nausea, gas, bloating and constipation.  Other possible side effects are headaches, belching, diarrhea, tiredness (fatigue), vomiting, upset stomach, dizziness, heartburn and stomach (abdominal pain).  If you think that you are becoming dehydrated, please inform our office or your primary family provider.  Stop immediately and go to ER if you have any symptoms of a serious allergic reaction including swelling of your face, lips, tongue or throat; problems breathing or swallowing; severe rash or itching; fainting or feeling dizzy; or very rapid heart rate.

## 2022-05-24 DIAGNOSIS — F339 Major depressive disorder, recurrent, unspecified: Secondary | ICD-10-CM | POA: Diagnosis not present

## 2022-05-24 DIAGNOSIS — F1421 Cocaine dependence, in remission: Secondary | ICD-10-CM | POA: Diagnosis not present

## 2022-05-24 NOTE — Progress Notes (Signed)
Chief Complaint:   OBESITY Rachel Woodard is here to discuss her progress with her obesity treatment plan along with follow-up of her obesity related diagnoses. Rachel Woodard is on the Category 3 Plan and states she is following her eating plan approximately 90% of the time. Rachel Woodard states she is exercising 0 minutes 0 times per week.  Today's visit was #: 18 Starting weight: 258 lbs Starting date: 03/14/2021 Today's weight: 246 lbs Today's date: 05/16/2022 Total lbs lost to date: 12 lbs Total lbs lost since last in-office visit: weight was not obtained during last office visit.  Interim History: Rachel Woodard saw her psychiatrist since her last visit and was started on Vylar. Stopped due to side effects and weight gain. Was switched to Wellbutrin SR 150 mg last week. She started nicotine patch last night. Reports weight was 254 lbs last. She is walking several days per week. Seeing a therapist on the regular basis.  Subjective:   1. Vitamin D deficiency Rachel Woodard is currently taking prescription Vit D 50,000 IU once a week. Denies any nausea, vomiting or muscle weakness.  Assessment/Plan:   1. Vitamin D deficiency We will refill Vit D 50,000 IU once a week for 1 month with 0 refills. Will recheck labs at next visit.  Low Vitamin D level contributes to fatigue and are associated with obesity, breast, and colon cancer. She agrees to continue to take prescription Vitamin D '@50'$ ,000 IU every week and will follow-up for routine testing of Vitamin D, at least 2-3 times per year to avoid over-replacement.   -Refill Vitamin D, Ergocalciferol, (DRISDOL) 1.25 MG (50000 UNIT) CAPS capsule; Take 1 capsule (50,000 Units total) by mouth every 7 (seven) days.  Dispense: 4 capsule; Refill: 0  2. Obesity, current BMI 39.8 Rachel Woodard is currently in the action stage of change. As such, her goal is to continue with weight loss efforts. She has agreed to the Category 3 Plan.   We will obtain labs at next  visit.  Exercise goals: All adults should avoid inactivity. Some physical activity is better than none, and adults who participate in any amount of physical activity gain some health benefits.  Behavioral modification strategies: increasing lean protein intake, increasing water intake, and holiday eating strategies .  Rachel Woodard has agreed to follow-up with our clinic in 4 weeks. She was informed of the importance of frequent follow-up visits to maximize her success with intensive lifestyle modifications for her multiple health conditions.   Objective:   Blood pressure 110/76, pulse 67, temperature 98.2 F (36.8 C), temperature source Oral, height '5\' 6"'$  (1.676 m), weight 246 lb (111.6 kg), SpO2 100 %. Body mass index is 39.71 kg/m.  General: Cooperative, alert, well developed, in no acute distress. HEENT: Conjunctivae and lids unremarkable. Cardiovascular: Regular rhythm.  Lungs: Normal work of breathing. Neurologic: No focal deficits.   Lab Results  Component Value Date   CREATININE 1.15 (H) 03/14/2021   BUN 20 03/14/2021   NA 139 03/14/2021   K 3.9 03/14/2021   CL 102 03/14/2021   CO2 22 03/14/2021   Lab Results  Component Value Date   ALT 15 03/14/2021   AST 17 03/14/2021   ALKPHOS 94 03/14/2021   BILITOT 0.2 03/14/2021   Lab Results  Component Value Date   HGBA1C 6.0 (H) 03/14/2021   HGBA1C 5.7 (H) 11/11/2018   HGBA1C 5.8 (H) 04/24/2018   HGBA1C 5.7 (H) 01/22/2018   HGBA1C 5.9 07/11/2017   Lab Results  Component Value Date   INSULIN  14.4 03/14/2021   Lab Results  Component Value Date   TSH 3.320 03/14/2021   Lab Results  Component Value Date   CHOL 170 03/14/2021   HDL 53 03/14/2021   LDLCALC 99 03/14/2021   TRIG 99 03/14/2021   CHOLHDL 3.1 07/11/2017   Lab Results  Component Value Date   VD25OH 33.0 03/14/2021   VD25OH 27.7 (L) 04/24/2018   VD25OH 23.0 (L) 01/22/2018   Lab Results  Component Value Date   WBC 7.6 03/14/2021   HGB 13.3 03/14/2021    HCT 40.5 03/14/2021   MCV 88 03/14/2021   PLT 255 03/14/2021   No results found for: "IRON", "TIBC", "FERRITIN"  Attestation Statements:   Reviewed by clinician on day of visit: allergies, medications, problem list, medical history, surgical history, family history, social history, and previous encounter notes.  I, Brendell Tyus, RMA, am acting as transcriptionist for Everardo Pacific, FNP.  I have reviewed the above documentation for accuracy and completeness, and I agree with the above. Everardo Pacific, FNP

## 2022-05-28 ENCOUNTER — Other Ambulatory Visit: Payer: Self-pay | Admitting: Internal Medicine

## 2022-05-28 DIAGNOSIS — R911 Solitary pulmonary nodule: Secondary | ICD-10-CM

## 2022-06-04 DIAGNOSIS — F1421 Cocaine dependence, in remission: Secondary | ICD-10-CM | POA: Diagnosis not present

## 2022-06-04 DIAGNOSIS — F339 Major depressive disorder, recurrent, unspecified: Secondary | ICD-10-CM | POA: Diagnosis not present

## 2022-06-06 ENCOUNTER — Other Ambulatory Visit: Payer: Medicare HMO

## 2022-06-08 DIAGNOSIS — G4733 Obstructive sleep apnea (adult) (pediatric): Secondary | ICD-10-CM | POA: Diagnosis not present

## 2022-06-08 DIAGNOSIS — R6889 Other general symptoms and signs: Secondary | ICD-10-CM | POA: Diagnosis not present

## 2022-06-09 DIAGNOSIS — M25512 Pain in left shoulder: Secondary | ICD-10-CM | POA: Diagnosis not present

## 2022-06-11 ENCOUNTER — Ambulatory Visit
Admission: RE | Admit: 2022-06-11 | Discharge: 2022-06-11 | Disposition: A | Discharge status: Home / Self Care | Payer: Medicare HMO | Source: Ambulatory Visit | Attending: Internal Medicine | Admitting: Internal Medicine

## 2022-06-11 DIAGNOSIS — R911 Solitary pulmonary nodule: Secondary | ICD-10-CM | POA: Diagnosis not present

## 2022-06-11 DIAGNOSIS — R918 Other nonspecific abnormal finding of lung field: Secondary | ICD-10-CM | POA: Diagnosis not present

## 2022-06-11 DIAGNOSIS — R6889 Other general symptoms and signs: Secondary | ICD-10-CM | POA: Diagnosis not present

## 2022-06-12 DIAGNOSIS — F339 Major depressive disorder, recurrent, unspecified: Secondary | ICD-10-CM | POA: Diagnosis not present

## 2022-06-13 ENCOUNTER — Ambulatory Visit (INDEPENDENT_AMBULATORY_CARE_PROVIDER_SITE_OTHER): Payer: Medicare HMO | Admitting: Nurse Practitioner

## 2022-06-13 ENCOUNTER — Encounter: Payer: Self-pay | Admitting: Nurse Practitioner

## 2022-06-13 VITALS — BP 119/74 | HR 71 | Temp 98.0°F | Ht 66.0 in | Wt 249.0 lb

## 2022-06-13 DIAGNOSIS — E559 Vitamin D deficiency, unspecified: Secondary | ICD-10-CM

## 2022-06-13 DIAGNOSIS — R6889 Other general symptoms and signs: Secondary | ICD-10-CM | POA: Diagnosis not present

## 2022-06-13 DIAGNOSIS — Z6841 Body Mass Index (BMI) 40.0 and over, adult: Secondary | ICD-10-CM | POA: Diagnosis not present

## 2022-06-13 DIAGNOSIS — K912 Postsurgical malabsorption, not elsewhere classified: Secondary | ICD-10-CM

## 2022-06-13 DIAGNOSIS — E669 Obesity, unspecified: Secondary | ICD-10-CM

## 2022-06-13 DIAGNOSIS — Z9884 Bariatric surgery status: Secondary | ICD-10-CM | POA: Diagnosis not present

## 2022-06-13 DIAGNOSIS — R7303 Prediabetes: Secondary | ICD-10-CM | POA: Diagnosis not present

## 2022-06-13 DIAGNOSIS — E66813 Obesity, class 3: Secondary | ICD-10-CM

## 2022-06-13 MED ORDER — VITAMIN D (ERGOCALCIFEROL) 1.25 MG (50000 UNIT) PO CAPS: 50000.0000 [IU] | ORAL_CAPSULE | ORAL | 0 refills | Status: DC

## 2022-06-14 LAB — HEMOGLOBIN A1C
Est. average glucose Bld gHb Est-mCnc: 126 mg/dL
Hgb A1c MFr Bld: 6 % — ABNORMAL HIGH (ref 4.8–5.6)

## 2022-06-14 LAB — COMPREHENSIVE METABOLIC PANEL
ALT: 17 IU/L (ref 0–32)
AST: 13 IU/L (ref 0–40)
Albumin/Globulin Ratio: 1.5 (ref 1.2–2.2)
Albumin: 4.1 g/dL (ref 3.9–4.9)
Alkaline Phosphatase: 114 IU/L (ref 44–121)
BUN/Creatinine Ratio: 23 (ref 12–28)
BUN: 27 mg/dL (ref 8–27)
Bilirubin Total: 0.2 mg/dL (ref 0.0–1.2)
CO2: 25 mmol/L (ref 20–29)
Calcium: 9.1 mg/dL (ref 8.7–10.3)
Chloride: 99 mmol/L (ref 96–106)
Creatinine, Ser: 1.18 mg/dL — ABNORMAL HIGH (ref 0.57–1.00)
Globulin, Total: 2.8 g/dL (ref 1.5–4.5)
Glucose: 100 mg/dL — ABNORMAL HIGH (ref 70–99)
Potassium: 4.6 mmol/L (ref 3.5–5.2)
Sodium: 138 mmol/L (ref 134–144)
Total Protein: 6.9 g/dL (ref 6.0–8.5)
eGFR: 50 mL/min/{1.73_m2} — ABNORMAL LOW (ref >59)

## 2022-06-14 LAB — FOLATE: Folate: 11.8 ng/mL (ref >3.0)

## 2022-06-14 LAB — PREALBUMIN: PREALBUMIN: 23 mg/dL (ref 10–36)

## 2022-06-14 LAB — FERRITIN: Ferritin: 17 ng/mL (ref 15–150)

## 2022-06-14 LAB — VITAMIN D 25 HYDROXY (VIT D DEFICIENCY, FRACTURES): Vit D, 25-Hydroxy: 36.9 ng/mL (ref 30.0–100.0)

## 2022-06-14 LAB — VITAMIN B12: Vitamin B-12: 612 pg/mL (ref 232–1245)

## 2022-06-20 DIAGNOSIS — M75122 Complete rotator cuff tear or rupture of left shoulder, not specified as traumatic: Secondary | ICD-10-CM | POA: Diagnosis not present

## 2022-06-20 DIAGNOSIS — R6889 Other general symptoms and signs: Secondary | ICD-10-CM | POA: Diagnosis not present

## 2022-06-21 DIAGNOSIS — F4312 Post-traumatic stress disorder, chronic: Secondary | ICD-10-CM | POA: Diagnosis not present

## 2022-06-21 DIAGNOSIS — R6889 Other general symptoms and signs: Secondary | ICD-10-CM | POA: Diagnosis not present

## 2022-06-21 DIAGNOSIS — G4730 Sleep apnea, unspecified: Secondary | ICD-10-CM | POA: Diagnosis not present

## 2022-06-21 DIAGNOSIS — F17210 Nicotine dependence, cigarettes, uncomplicated: Secondary | ICD-10-CM | POA: Diagnosis not present

## 2022-06-21 DIAGNOSIS — F411 Generalized anxiety disorder: Secondary | ICD-10-CM | POA: Diagnosis not present

## 2022-06-21 DIAGNOSIS — R7303 Prediabetes: Secondary | ICD-10-CM | POA: Diagnosis not present

## 2022-06-21 DIAGNOSIS — F3130 Bipolar disorder, current episode depressed, mild or moderate severity, unspecified: Secondary | ICD-10-CM | POA: Diagnosis not present

## 2022-06-27 DIAGNOSIS — F339 Major depressive disorder, recurrent, unspecified: Secondary | ICD-10-CM | POA: Diagnosis not present

## 2022-07-03 DIAGNOSIS — M75122 Complete rotator cuff tear or rupture of left shoulder, not specified as traumatic: Secondary | ICD-10-CM | POA: Diagnosis not present

## 2022-07-03 DIAGNOSIS — M19012 Primary osteoarthritis, left shoulder: Secondary | ICD-10-CM | POA: Diagnosis not present

## 2022-07-03 DIAGNOSIS — M24112 Other articular cartilage disorders, left shoulder: Secondary | ICD-10-CM | POA: Diagnosis not present

## 2022-07-03 DIAGNOSIS — S46192A Other injury of muscle, fascia and tendon of long head of biceps, left arm, initial encounter: Secondary | ICD-10-CM | POA: Diagnosis not present

## 2022-07-03 DIAGNOSIS — M7542 Impingement syndrome of left shoulder: Secondary | ICD-10-CM | POA: Diagnosis not present

## 2022-07-03 DIAGNOSIS — S46112A Strain of muscle, fascia and tendon of long head of biceps, left arm, initial encounter: Secondary | ICD-10-CM | POA: Diagnosis not present

## 2022-07-03 DIAGNOSIS — M94212 Chondromalacia, left shoulder: Secondary | ICD-10-CM | POA: Diagnosis not present

## 2022-07-03 DIAGNOSIS — M67814 Other specified disorders of tendon, left shoulder: Secondary | ICD-10-CM | POA: Diagnosis not present

## 2022-07-03 DIAGNOSIS — G8918 Other acute postprocedural pain: Secondary | ICD-10-CM | POA: Diagnosis not present

## 2022-07-03 NOTE — Progress Notes (Signed)
Chief Complaint:   OBESITY Rachel Woodard is here to discuss her progress with her obesity treatment plan along with follow-up of her obesity related diagnoses. Rachel Woodard is on the Category 3 Plan and states she is following her eating plan approximately 75% of the time. Rachel Woodard states she is walking 30-60 minutes 2 times per week.  Today's visit was #: 15 Starting weight: 258 lbs Starting date: 03/14/2021 Today's weight: 249 lbs Today's date: 06/13/2022 Total lbs lost to date: 9 lbs Total lbs lost since last in-office visit: 0  Interim History: Rachel Woodard is seeing her therapist every 1 to 2 weeks.  She has reached out to her psychiatrist to discuss adjusting her medications.  Feels she has gotten off track since her last visit.  Subjective:   1. Hx of bariatric surgery Rachel Woodard had gastric bypass in 2021.  Her highest weight prior to surgery was 286 lbs and her nadir weight was 186 lbs.  She is taking vitamin D 50,000 units weekly.  She is not taking a multivitamin, calcium or vitamin B.  2. Postoperative malabsorption Rachel Woodard had gastric bypass in 2021.  Her highest weight prior to surgery was 286 lbs and her nadir weight was 186 lbs.  She is taking vitamin D 50,000 units weekly.  She is not taking a multivitamin, calcium or vitamin B.  3. Vitamin D deficiency Rachel Woodard is currently taking prescription Vit D 50,000 IU once a week.  Denies any nausea, vomiting or muscle weakness.  4. Prediabetes Rachel Woodard took Metformin in the past and stopped due to side effects.  Assessment/Plan:   1. Hx of bariatric surgery We will obtain labs today.  - Hemoglobin A1c - Folate - Ferritin - Prealbumin - Vitamin B12 - Comprehensive metabolic panel  2. Postoperative malabsorption We will obtain labs today.  - Hemoglobin A1c - Folate - Ferritin - Prealbumin - Vitamin B12 - Comprehensive metabolic panel  3. Vitamin D deficiency We will obtain labs today. We will refill Vit D 50K IU once a  week for 1 month with 0 refills.  Side effects discussed.   -Refill Vitamin D, Ergocalciferol, (DRISDOL) 1.25 MG (50000 UNIT) CAPS capsule; Take 1 capsule (50,000 Units total) by mouth every 7 (seven) days.  Dispense: 4 capsule; Refill: 0  - VITAMIN D 25 Hydroxy (Vit-D Deficiency, Fractures) - Comprehensive metabolic panel  4. Prediabetes We will obtain labs today. Rachel Woodard will continue to work on weight loss, exercise, and decreasing simple carbohydrates to help decrease the risk of diabetes.    - Hemoglobin A1c - Comprehensive metabolic panel  5. Obesity, current BMI 40.3 Rachel Woodard is currently in the action stage of change. As such, her goal is to continue with weight loss efforts. She has agreed to the Category 3 Plan.   Will obtain IC at next visit.  Exercise goals: As is.  Behavioral modification strategies: increasing lean protein intake, increasing water intake, and holiday eating strategies .  Rachel Woodard has agreed to follow-up with our clinic in 3 weeks. She was informed of the importance of frequent follow-up visits to maximize her success with intensive lifestyle modifications for her multiple health conditions.   Rachel Woodard was informed we would discuss her lab results at her next visit unless there is a critical issue that needs to be addressed sooner. Rachel Woodard agreed to keep her next visit at the agreed upon time to discuss these results.  Objective:   Blood pressure 119/74, pulse 71, temperature 98 F (36.7 C), temperature source Oral, height 5'  6" (1.676 m), weight 249 lb (112.9 kg), SpO2 99 %. Body mass index is 40.19 kg/m.  General: Cooperative, alert, well developed, in no acute distress. HEENT: Conjunctivae and lids unremarkable. Cardiovascular: Regular rhythm.  Lungs: Normal work of breathing. Neurologic: No focal deficits.   Lab Results  Component Value Date   CREATININE 1.18 (H) 06/13/2022   BUN 27 06/13/2022   NA 138 06/13/2022   K 4.6 06/13/2022   CL 99  06/13/2022   CO2 25 06/13/2022   Lab Results  Component Value Date   ALT 17 06/13/2022   AST 13 06/13/2022   ALKPHOS 114 06/13/2022   BILITOT 0.2 06/13/2022   Lab Results  Component Value Date   HGBA1C 6.0 (H) 06/13/2022   HGBA1C 6.0 (H) 03/14/2021   HGBA1C 5.7 (H) 11/11/2018   HGBA1C 5.8 (H) 04/24/2018   HGBA1C 5.7 (H) 01/22/2018   Lab Results  Component Value Date   INSULIN 14.4 03/14/2021   Lab Results  Component Value Date   TSH 3.320 03/14/2021   Lab Results  Component Value Date   CHOL 170 03/14/2021   HDL 53 03/14/2021   LDLCALC 99 03/14/2021   TRIG 99 03/14/2021   CHOLHDL 3.1 07/11/2017   Lab Results  Component Value Date   VD25OH 36.9 06/13/2022   VD25OH 33.0 03/14/2021   VD25OH 27.7 (L) 04/24/2018   Lab Results  Component Value Date   WBC 7.6 03/14/2021   HGB 13.3 03/14/2021   HCT 40.5 03/14/2021   MCV 88 03/14/2021   PLT 255 03/14/2021   Lab Results  Component Value Date   FERRITIN 17 06/13/2022   Attestation Statements:   Reviewed by clinician on day of visit: allergies, medications, problem list, medical history, surgical history, family history, social history, and previous encounter notes.  I, Brendell Tyus, RMA, am acting as transcriptionist for Everardo Pacific, FNP.  I have reviewed the above documentation for accuracy and completeness, and I agree with the above. Everardo Pacific, FNP

## 2022-07-05 ENCOUNTER — Ambulatory Visit: Payer: Medicare HMO | Admitting: Nurse Practitioner

## 2022-07-06 DIAGNOSIS — F1421 Cocaine dependence, in remission: Secondary | ICD-10-CM | POA: Diagnosis not present

## 2022-07-06 DIAGNOSIS — F339 Major depressive disorder, recurrent, unspecified: Secondary | ICD-10-CM | POA: Diagnosis not present

## 2022-07-10 NOTE — Progress Notes (Signed)
Called patient and she informed me that she has an appt with her PCP in March, copies has been send to PCP

## 2022-07-12 ENCOUNTER — Ambulatory Visit: Payer: Medicare HMO | Attending: Specialist

## 2022-07-12 ENCOUNTER — Other Ambulatory Visit: Payer: Self-pay

## 2022-07-12 DIAGNOSIS — M25512 Pain in left shoulder: Secondary | ICD-10-CM | POA: Diagnosis not present

## 2022-07-12 DIAGNOSIS — M6281 Muscle weakness (generalized): Secondary | ICD-10-CM | POA: Diagnosis not present

## 2022-07-12 DIAGNOSIS — R6889 Other general symptoms and signs: Secondary | ICD-10-CM | POA: Diagnosis not present

## 2022-07-12 DIAGNOSIS — M25612 Stiffness of left shoulder, not elsewhere classified: Secondary | ICD-10-CM | POA: Diagnosis not present

## 2022-07-12 NOTE — Therapy (Signed)
OUTPATIENT PHYSICAL THERAPY SHOULDER EVALUATION   Patient Name: Rachel Woodard MRN: [id number] DOB:1951-03-18, 72 y.o., female Today's Date: 07/13/2022  END OF SESSION:  PT End of Session - 07/12/22 1116     Visit Number 1    Number of Visits 17    Date for PT Re-Evaluation 09/14/22    Authorization Type HUMANA MEDICARE HMO;  [place]    PT Start Time 1103    PT Stop Time 1145    PT Time Calculation (min) 42 min    Equipment Utilized During Treatment Other (comment)   Abduction sling   Activity Tolerance Patient tolerated treatment well    Behavior During Therapy WFL for tasks assessed/performed             Past Medical History:  Diagnosis Date   Anemia 2001   after gastric bypass   Anxiety    Aortic atherosclerosis (HCC)    Arthritis    Back pain    Bilateral swelling of feet    Bipolar 1 disorder (HCC)    Breast cancer ([place]) 2013   Breast cancer    Radiation therapy   Calcified granuloma of lung (HCC)    Cigarette nicotine dependence    Cigarette nicotine dependence in remission    Cocaine dependence in remission ([place])    COPD (chronic obstructive pulmonary disease) (HCC)    Depression    Disequilibrium    Fibromyalgia    GERD (gastroesophageal reflux disease)    HSV infection    Hypertension    Hypertensive kidney disease with stage 3a chronic kidney disease ([place])    Hypertensive kidney disease with stage 3b chronic kidney disease ([place])    Osteoarthritis    Personal history of infectious disease    Personal history of radiation therapy 2013   Pneumonia    Pre-diabetes    PTSD (post-traumatic stress disorder)    Seasonal allergic rhinitis due to pollen    Sleep apnea    uses CPAP   SOB (shortness of breath)    Vitamin D deficiency    Past Surgical History:  Procedure Laterality Date   ABDOMINAL HYSTERECTOMY  2018   BREAST LUMPECTOMY Right 2013   GASTRIC BYPASS  2001   TOTAL KNEE ARTHROPLASTY Left 12/22/2018   Procedure:  LEFT TOTAL KNEE ARTHROPLASTY;  Surgeon: Leandrew Koyanagi, MD;  Location: [facility];  Service: Orthopedics;  Laterality: Left;   Patient Active Problem List   Diagnosis Date Noted   Pain due to onychomycosis of toenails of both feet 01/20/2020   Shortness of breath 10/05/2019   Status post total left knee replacement 12/22/2018   MDD (major depressive disorder), recurrent episode, mild ([place]) 10/29/2018   Autoimmune hepatitis ([place]) 10/29/2018   OSA (obstructive sleep apnea) 10/29/2018   Renal cyst, right 10/22/2018   Lung nodule, solitary 10/22/2018   Genital herpes simplex 10/29/2017   Vitamin D deficiency 10/29/2017   Class 3 severe obesity with serious comorbidity and body mass index (BMI) of 40.0 to 44.9 in adult [facility]) 10/29/2017   Essential hypertension 10/29/2017   Prediabetes 02/14/2017   Fibromyalgia 02/14/2017   Substance abuse in remission ([place]) 02/14/2017   Anxiety and depression 02/14/2017   HX: breast cancer 02/14/2017   Urge incontinence 11/13/2016   Unilateral primary osteoarthritis, left knee 06/14/2016   GERD (gastroesophageal reflux disease) 11/02/2015   Angioedema 10/24/2013   Tobacco abuse 10/24/2013   DCIS (ductal carcinoma in situ) of breast 04/07/2013   Depression 04/07/2013  PCP: Scheryl Marten, [place]  REFERRING PROVIDER: Sydnee Cabal, MD   REFERRING DIAG: [phone]: Encounter for other orthopedic aftercare. Left shoulder scope SAD/SCR/RCR   THERAPY DIAG:  Acute pain of left shoulder  Muscle weakness (generalized)  Stiffness of left shoulder, not elsewhere classified  Rationale for Evaluation and Treatment: Rehabilitation  ONSET DATE: 07/03/22 Surgery  SUBJECTIVE:                                                                                                                                                                                      SUBJECTIVE STATEMENT: Pt reports she is to wear the abduction sling for 4 to 6 weeks after  surgery  PERTINENT HISTORY: High BMI, Arthritis, anxiety, COPD  PAIN:  Are you having pain? Yes: NPRS scale: 6/10 Pain location: L shoulder Pain description: ache, constant Aggravating factors: Dressing Relieving factors: Rest, hot shower, medication  PRECAUTIONS: Shoulder  WEIGHT BEARING RESTRICTIONS: No  FALLS:  Has patient fallen in last 6 months? No  LIVING ENVIRONMENT: Lives with: lives alone Lives in: House/apartment N issue with accessing or mobility within home  OCCUPATION: Retire  PLOF: Independent  PATIENT GOALS: Better use of the L arm c less paln  NEXT MD VISIT: 07/16/22  OBJECTIVE:   DIAGNOSTIC FINDINGS:  None recent for L shoulder in Epic  PATIENT SURVEYS:  FOTO: Perceived function   32%, predicted   58%   COGNITION: Overall cognitive status: Within functional limits for tasks assessed     SENSATION: WFL  POSTURE: Forward head c rounded shoulders  UPPER EXTREMITY ROM:   Passive ROM Right eval Left eval  Shoulder flexion A140 P60  Shoulder extension    Shoulder abduction A140 P60  Shoulder adduction    Shoulder internal rotation    Shoulder external rotation A70 P40  Elbow flexion    Elbow extension    Wrist flexion    Wrist extension    Wrist ulnar deviation    Wrist radial deviation    Wrist pronation    Wrist supination    (Blank rows = not tested)  UPPER EXTREMITY MMT:  NT MMT Right eval Left eval  Shoulder flexion    Shoulder extension    Shoulder abduction    Shoulder adduction    Shoulder internal rotation    Shoulder external rotation    Middle trapezius    Lower trapezius    Elbow flexion    Elbow extension    Wrist flexion    Wrist extension    Wrist ulnar deviation    Wrist radial deviation    Wrist pronation    Wrist supination    Grip strength (  lbs)    (Blank rows = not tested)  SHOULDER SPECIAL TESTS: Impingement tests:  NT SLAP lesions:  NT Instability tests:  NT Rotator cuff assessment:   NT Biceps assessment:  NT  JOINT MOBILITY TESTING:  NT  PALPATION:  TTP of the L peri-shoulder   TODAY'S TREATMENT:                                                                                                                                          [facility] Adult PT Treatment:                                                DATE: 07/12/22 Therapeutic Exercise: Developed, instructed in, and pt completed therex as noted in HEP Self Care: Use of cold packs 10-15 mins after HEP and periodically for symptom management Use of abduction sling  PATIENT EDUCATION: Education details: Eval findings, POC, HEP, self care Person educated: Patient Education method: Explanation, Demonstration, Tactile cues, Verbal cues, and Handouts Education comprehension: verbalized understanding, returned demonstration, verbal cues required, and tactile cues required  HOME EXERCISE PROGRAM: Access Code: [email or web address] URL: [email or web address] Date: 07/12/2022 Prepared by: Gar Ponto  Exercises - Flexion-Extension Shoulder Pendulum with Table Support  - 1 x daily - 7 x weekly - 1 sets - 20 reps - Horizontal Shoulder Pendulum with Table Support  - 1 x daily - 7 x weekly - 1 sets - 20 reps - Standing Shoulder and Trunk Flexion at Table  - 1 x daily - 7 x weekly - 1 sets - 10 reps - 5 hold - Seated Shoulder External Rotation PROM on Table  - 1 x daily - 7 x weekly - 1 sets - 10 reps - 5 hold  ASSESSMENT:  CLINICAL IMPRESSION: Patient is a 72 y.o. female who was seen today for physical therapy evaluation and treatment for ICD-10:Z47.89: Encounter for other orthopedicaftercare. Left shoulder scope SAD/SCR/RCR 07/03/22.  OBJECTIVE IMPAIRMENTS: decreased activity tolerance, decreased ROM, decreased strength, impaired UE functional use, pain, and heavy arm .   ACTIVITY LIMITATIONS: carrying, lifting, sleeping, bed mobility, bathing, toileting, dressing, reach over head, and caring for  others  PARTICIPATION LIMITATIONS: meal prep, cleaning, laundry, and driving  PERSONAL FACTORS: Age, Fitness, Past/current experiences, Social background, and 1 comorbidity: High BMI-heavy arm  are also affecting patient's functional outcome.   REHAB POTENTIAL: Good  CLINICAL DECISION MAKING: Evolving/moderate complexity  EVALUATION COMPLEXITY: Moderate   GOALS:  SHORT TERM GOALS: Target date: 07/27/22  Pt will be Ind in an initial HEP  Baseline: Goal status: INITIAL  LONG TERM GOALS: Target date: 09/14/22  Pt will be Ind in a final HEP to maintain achieved LOF Baseline: initiated Goal status: INITIAL  2.  Improve L shoulder PROM to flexion  140, abd 120, ER 50  Baseline: see flow sheet Goal status: INITIAL  3.  Pt's FOTO score will improved to the predicted value of 58% as indication of improved function by DC anticpated to be after 09/14/22 Baseline: 32% Goal status: INITIAL  4.  Develop LTGs after 3//22/24 date when pt is able to particiapte in AROM and strengthening therex Baseline:  Goal status: INITIAL  PLAN:  PT FREQUENCY: 1-2x/week  PT DURATION: 8 weeks  PLANNED INTERVENTIONS: Therapeutic exercises, Therapeutic activity, Patient/Family education, Self Care, Joint mobilization, Aquatic Therapy, Dry Needling, Electrical stimulation, Cryotherapy, Moist heat, Taping, Vasopneumatic device, Ultrasound, Ionotophoresis '4mg'$ /ml Dexamethasone, Manual therapy, and Re-evaluation  PLAN FOR NEXT SESSION: Review FOTO; assess response to HEP; progress therex as indicated; use of modalities, manual therapy; and TPDN as indicated.  Quinnley Colasurdo MS, PT 07/13/22 6:28 AM  Referring diagnosis? ICD-10:Z47.89: Encounter for other orthopedic aftercare. Left shoulder scope SAD/SCR/RCR   Treatment diagnosis? (if different than referring diagnosis) Acute pain of left shoulder; Muscle weakness (generalized); Stiffness of left shoulder, not elsewhere classified What was this (referring dx)  caused by? '[x]'$  Surgery '[]'$  Fall '[]'$  Ongoing issue '[]'$  Arthritis '[]'$  Other: ____________  Laterality: '[]'$  Rt '[x]'$  Lt '[]'$  Both  Check all possible CPT codes:  *CHOOSE 10 OR LESS*    '[x]'$  97110 (Therapeutic Exercise)  '[]'$  92507 (SLP Treatment)  '[]'$  97112 (Neuro Re-ed)   '[]'$  92526 (Swallowing Treatment)   '[]'$  97116 (Gait Training)   '[]'$  [id number] (Cognitive Training, 1st 15 minutes) '[x]'$  97140 (Manual Therapy)   '[]'$  97130 (Cognitive Training, each add'l 15 minutes)  '[x]'$  97164 (Re-evaluation)                              '[]'$  Other, List CPT Code ____________  '[x]'$  97530 (Therapeutic Activities)     '[x]'$  97535 (Self Care)   '[]'$  All codes above (97110 - 97535)  '[]'$  97012 (Mechanical Traction)  '[x]'$  97014 (E-stim Unattended)  '[x]'$  97032 (E-stim manual)  '[x]'$  97033 (Ionto)  '[x]'$  97035 (Ultrasound) '[]'$  97750 (Physical Performance Training) '[x]'$  [id number] (Aquatic Therapy) '[x]'$  97016 (Vasopneumatic Device) '[]'$  [id number] (Paraffin) '[]'$  97034 (Contrast Bath) '[]'$  97597 (Wound Care 1st 20 sq cm) '[]'$  97598 (Wound Care each add'l 20 sq cm) '[]'$  97760 (Orthotic Fabrication, Fitting, Training Initial) '[]'$  [id number] (Prosthetic Management and Training Initial) '[]'$  [id number] (Orthotic or Prosthetic Training/ Modification Subsequent)

## 2022-07-16 DIAGNOSIS — E119 Type 2 diabetes mellitus without complications: Secondary | ICD-10-CM | POA: Diagnosis not present

## 2022-07-16 DIAGNOSIS — R6889 Other general symptoms and signs: Secondary | ICD-10-CM | POA: Diagnosis not present

## 2022-07-16 DIAGNOSIS — Z4789 Encounter for other orthopedic aftercare: Secondary | ICD-10-CM | POA: Diagnosis not present

## 2022-07-17 ENCOUNTER — Ambulatory Visit (INDEPENDENT_AMBULATORY_CARE_PROVIDER_SITE_OTHER): Payer: Medicare HMO | Admitting: Nurse Practitioner

## 2022-07-17 ENCOUNTER — Encounter: Payer: Self-pay | Admitting: Nurse Practitioner

## 2022-07-17 VITALS — BP 110/69 | HR 75 | Temp 98.0°F | Ht 66.0 in

## 2022-07-17 DIAGNOSIS — R7303 Prediabetes: Secondary | ICD-10-CM

## 2022-07-17 DIAGNOSIS — Z6841 Body Mass Index (BMI) 40.0 and over, adult: Secondary | ICD-10-CM | POA: Diagnosis not present

## 2022-07-17 DIAGNOSIS — R6889 Other general symptoms and signs: Secondary | ICD-10-CM | POA: Diagnosis not present

## 2022-07-17 DIAGNOSIS — E559 Vitamin D deficiency, unspecified: Secondary | ICD-10-CM

## 2022-07-17 DIAGNOSIS — E669 Obesity, unspecified: Secondary | ICD-10-CM | POA: Diagnosis not present

## 2022-07-17 MED ORDER — VITAMIN D (ERGOCALCIFEROL) 1.25 MG (50000 UNIT) PO CAPS: 50000.0000 [IU] | ORAL_CAPSULE | ORAL | 0 refills | Status: DC

## 2022-07-18 ENCOUNTER — Ambulatory Visit: Payer: Medicare HMO | Admitting: Physical Therapy

## 2022-07-19 ENCOUNTER — Encounter: Payer: Self-pay | Admitting: Physical Therapy

## 2022-07-19 ENCOUNTER — Ambulatory Visit: Payer: Medicare HMO | Admitting: Physical Therapy

## 2022-07-19 DIAGNOSIS — M6281 Muscle weakness (generalized): Secondary | ICD-10-CM

## 2022-07-19 DIAGNOSIS — M25612 Stiffness of left shoulder, not elsewhere classified: Secondary | ICD-10-CM

## 2022-07-19 DIAGNOSIS — R6889 Other general symptoms and signs: Secondary | ICD-10-CM | POA: Diagnosis not present

## 2022-07-19 DIAGNOSIS — M25512 Pain in left shoulder: Secondary | ICD-10-CM | POA: Diagnosis not present

## 2022-07-19 NOTE — Therapy (Signed)
OUTPATIENT PHYSICAL THERAPY TREATMENT NOTE   Patient Name: Rachel Woodard MRN: [id number] DOB:Jul 22, 1950, 72 y.o., female Today's Date: 07/19/2022  PCP: Scheryl Marten, [place]   REFERRING PROVIDER: Sydnee Cabal, MD   END OF SESSION:   PT End of Session - 07/19/22 1026     Visit Number 2    Number of Visits 17    Date for PT Re-Evaluation 09/14/22    Authorization Type [employer] MEDICARE HMO;  [place] Time Period 07/18/22-09/01/22    Authorization - Visit Number 1    Authorization - Number of Visits 12    PT Start Time 1020    PT Stop Time 1100    PT Time Calculation (min) 40 min             Past Medical History:  Diagnosis Date   Anemia 2001   after gastric bypass   Anxiety    Aortic atherosclerosis (HCC)    Arthritis    Back pain    Bilateral swelling of feet    Bipolar 1 disorder (HCC)    Breast cancer ([place]) 2013   Breast cancer    Radiation therapy   Calcified granuloma of lung (HCC)    Cigarette nicotine dependence    Cigarette nicotine dependence in remission    Cocaine dependence in remission ([place])    COPD (chronic obstructive pulmonary disease) (HCC)    Depression    Disequilibrium    Fibromyalgia    GERD (gastroesophageal reflux disease)    HSV infection    Hypertension    Hypertensive kidney disease with stage 3a chronic kidney disease ([place])    Hypertensive kidney disease with stage 3b chronic kidney disease ([place])    Osteoarthritis    Personal history of infectious disease    Personal history of radiation therapy 2013   Pneumonia    Pre-diabetes    PTSD (post-traumatic stress disorder)    Seasonal allergic rhinitis due to pollen    Sleep apnea    uses CPAP   SOB (shortness of breath)    Vitamin D deficiency    Past Surgical History:  Procedure Laterality Date   ABDOMINAL HYSTERECTOMY  2018   BREAST LUMPECTOMY Right 2013   GASTRIC BYPASS  2001   TOTAL KNEE ARTHROPLASTY Left 12/22/2018   Procedure:  LEFT TOTAL KNEE ARTHROPLASTY;  Surgeon: Leandrew Koyanagi, MD;  Location: [facility];  Service: Orthopedics;  Laterality: Left;   Patient Active Problem List   Diagnosis Date Noted   Pain due to onychomycosis of toenails of both feet 01/20/2020   Shortness of breath 10/05/2019   Status post total left knee replacement 12/22/2018   MDD (major depressive disorder), recurrent episode, mild ([place]) 10/29/2018   Autoimmune hepatitis ([place]) 10/29/2018   OSA (obstructive sleep apnea) 10/29/2018   Renal cyst, right 10/22/2018   Lung nodule, solitary 10/22/2018   Genital herpes simplex 10/29/2017   Vitamin D deficiency 10/29/2017   Class 3 severe obesity with serious comorbidity and body mass index (BMI) of 40.0 to 44.9 in adult [facility]) 10/29/2017   Essential hypertension 10/29/2017   Prediabetes 02/14/2017   Fibromyalgia 02/14/2017   Substance abuse in remission ([place]) 02/14/2017   Anxiety and depression 02/14/2017   HX: breast cancer 02/14/2017   Urge incontinence 11/13/2016   Unilateral primary osteoarthritis, left knee 06/14/2016   GERD (gastroesophageal reflux disease) 11/02/2015   Angioedema 10/24/2013   Tobacco abuse 10/24/2013   DCIS (ductal carcinoma in situ) of breast  04/07/2013   Depression 04/07/2013    REFERRING DIAG: ICD-10:Z47.89: Encounter for other orthopedic aftercare. Left shoulder scope SAD/SCR/RCR  THERAPY DIAG:  Stiffness of left shoulder, not elsewhere classified  Muscle weakness (generalized)  Rationale for Evaluation and Treatment Rehabilitation  PERTINENT HISTORY: High BMI, Arthritis, anxiety, COPD  PRECAUTIONS: Shoulder  SUBJECTIVE:                                                                                                                                                                                      SUBJECTIVE STATEMENT:  The shoulder is 5/10. I had a lot of trouble sleeping the night before last. I have been doing the exercises.    PAIN:  Are you  having pain? Yes: NPRS scale: 5/10 Pain location: L shoulder Pain description: ache, constant Aggravating factors: Dressing Relieving factors: Rest, hot shower, medication   OBJECTIVE: (objective measures completed at initial evaluation unless otherwise dated)   DIAGNOSTIC FINDINGS:  None recent for L shoulder in Epic   PATIENT SURVEYS:  FOTO: Perceived function   32%, predicted   58%    COGNITION: Overall cognitive status: Within functional limits for tasks assessed                                  SENSATION: WFL   POSTURE: Forward head c rounded shoulders   UPPER EXTREMITY ROM:    Passive ROM Right eval Left eval Left 07/19/22  Shoulder flexion A140 P60 P80  Shoulder extension       Shoulder abduction A140 P60 P80  Shoulder adduction       Shoulder internal rotation       Shoulder external rotation A70 P40 P40  Elbow flexion       Elbow extension       Wrist flexion       Wrist extension       Wrist ulnar deviation       Wrist radial deviation       Wrist pronation       Wrist supination       (Blank rows = not tested)   UPPER EXTREMITY MMT:            NT MMT Right eval Left eval  Shoulder flexion      Shoulder extension      Shoulder abduction      Shoulder adduction      Shoulder internal rotation      Shoulder external rotation      Middle trapezius      Lower trapezius      Elbow  flexion      Elbow extension      Wrist flexion      Wrist extension      Wrist ulnar deviation      Wrist radial deviation      Wrist pronation      Wrist supination      Grip strength (lbs)      (Blank rows = not tested)   SHOULDER SPECIAL TESTS: Impingement tests:  NT SLAP lesions:  NT Instability tests:  NT Rotator cuff assessment:  NT Biceps assessment:  NT   JOINT MOBILITY TESTING:  NT   PALPATION:  TTP of the L peri-shoulder             TODAY'S TREATMENT:        [facility] Adult PT Treatment:                                                DATE:  07/19/22 Therapeutic Exercise: Pendulums Scap retractions Seated chair roll back x 10 Seated ER PROM x 10 Manual Therapy: Passive left shoulder flexion, abduction and ER Modalities: Left shoulder Vaso Low pressure , 34 degrees x 15 minutes                                                                                                                                      [facility] Adult PT Treatment:                                                DATE: 07/12/22 Therapeutic Exercise: Developed, instructed in, and pt completed therex as noted in HEP Self Care: Use of cold packs 10-15 mins after HEP and periodically for symptom management Use of abduction sling   PATIENT EDUCATION: Education details: Eval findings, POC, HEP, self care Person educated: Patient Education method: Explanation, Demonstration, Tactile cues, Verbal cues, and Handouts Education comprehension: verbalized understanding, returned demonstration, verbal cues required, and tactile cues required   HOME EXERCISE PROGRAM: Access Code: [email or web address] URL: [email or web address] Date: 07/12/2022 Prepared by: Gar Ponto   Exercises - Flexion-Extension Shoulder Pendulum with Table Support  - 1 x daily - 7 x weekly - 1 sets - 20 reps - Horizontal Shoulder Pendulum with Table Support  - 1 x daily - 7 x weekly - 1 sets - 20 reps - Standing Shoulder and Trunk Flexion at Table  - 1 x daily - 7 x weekly - 1 sets - 10 reps - 5 hold - Seated Shoulder External Rotation PROM on Table  - 1 x daily - 7 x weekly - 1 sets - 10 reps - 5 hold   ASSESSMENT:  CLINICAL IMPRESSION: Patient is a 72 y.o. female who was seen today for physical therapy treatment for ICD-10:Z47.89: Encounter for other orthopedicaftercare. Left shoulder scope SAD/SCR/RCR 07/03/22. Pt arrives with 5/10 pain and reports compliance with HEP. Reviewed HEP and she is independent. She did admit to lifting her arm out to the side while in the sling and she was  reminded of her shoulder precautions due to RCR. Trial of Vasopneumatic device at end of session and she found this helpful. Her PROM was improved for flexion and abduction. She has pain with PROM and difficulty relaxing. She had less pain with cues to relax.    OBJECTIVE IMPAIRMENTS: decreased activity tolerance, decreased ROM, decreased strength, impaired UE functional use, pain, and heavy arm .    ACTIVITY LIMITATIONS: carrying, lifting, sleeping, bed mobility, bathing, toileting, dressing, reach over head, and caring for others   PARTICIPATION LIMITATIONS: meal prep, cleaning, laundry, and driving   PERSONAL FACTORS: Age, Fitness, Past/current experiences, Social background, and 1 comorbidity: High BMI-heavy arm  are also affecting patient's functional outcome.    REHAB POTENTIAL: Good   CLINICAL DECISION MAKING: Evolving/moderate complexity   EVALUATION COMPLEXITY: Moderate     GOALS:   SHORT TERM GOALS: Target date: 07/27/22   Pt will be Ind in an initial HEP  Baseline: Goal status: INITIAL   LONG TERM GOALS: Target date: 09/14/22   Pt will be Ind in a final HEP to maintain achieved LOF Baseline: initiated Goal status: INITIAL   2.  Improve L shoulder PROM to flexion 140, abd 120, ER 50  Baseline: see flow sheet Goal status: INITIAL   3.  Pt's FOTO score will improved to the predicted value of 58% as indication of improved function by DC anticpated to be after 09/14/22 Baseline: 32% Goal status: INITIAL   4.  Develop LTGs after 3//22/24 date when pt is able to particiapte in AROM and strengthening therex Baseline:  Goal status: INITIAL   PLAN:   PT FREQUENCY: 1-2x/week   PT DURATION: 8 weeks   PLANNED INTERVENTIONS: Therapeutic exercises, Therapeutic activity, Patient/Family education, Self Care, Joint mobilization, Aquatic Therapy, Dry Needling, Electrical stimulation, Cryotherapy, Moist heat, Taping, Vasopneumatic device, Ultrasound, Ionotophoresis '4mg'$ /ml  Dexamethasone, Manual therapy, and Re-evaluation   PLAN FOR NEXT SESSION: Review FOTO; assess response to HEP; progress therex as indicated; use of modalities, manual therapy; and TPDN as indicated.     Hessie Diener, PTA 07/19/22 12:44 PM Phone: [phone] Fax: [phone]

## 2022-07-24 DIAGNOSIS — R6889 Other general symptoms and signs: Secondary | ICD-10-CM | POA: Diagnosis not present

## 2022-07-25 ENCOUNTER — Ambulatory Visit: Payer: Medicare HMO

## 2022-07-25 DIAGNOSIS — M25612 Stiffness of left shoulder, not elsewhere classified: Secondary | ICD-10-CM | POA: Diagnosis not present

## 2022-07-25 DIAGNOSIS — M6281 Muscle weakness (generalized): Secondary | ICD-10-CM

## 2022-07-25 DIAGNOSIS — M25512 Pain in left shoulder: Secondary | ICD-10-CM

## 2022-07-25 DIAGNOSIS — R6889 Other general symptoms and signs: Secondary | ICD-10-CM | POA: Diagnosis not present

## 2022-07-25 NOTE — Therapy (Signed)
OUTPATIENT PHYSICAL THERAPY TREATMENT NOTE   Patient Name: Rachel Woodard MRN: [id number] DOB:03/29/1951, 72 y.o., female Today's Date: 07/25/2022  PCP: Scheryl Marten, [place]   REFERRING PROVIDER: Sydnee Cabal, MD   END OF SESSION:   PT End of Session - 07/25/22 1238     Visit Number 3    Number of Visits 17    Date for PT Re-Evaluation 09/14/22    Authorization Type [employer] MEDICARE HMO;  [place] Time Period 07/18/22-09/01/22    Authorization - Visit Number 2    Authorization - Number of Visits 12    PT Start Time 5176    PT Stop Time 1607    PT Time Calculation (min) 45 min    Equipment Utilized During Treatment Other (comment)   sling   Activity Tolerance Patient tolerated treatment well    Behavior During Therapy WFL for tasks assessed/performed              Past Medical History:  Diagnosis Date   Anemia 2001   after gastric bypass   Anxiety    Aortic atherosclerosis (HCC)    Arthritis    Back pain    Bilateral swelling of feet    Bipolar 1 disorder ([place])    Breast cancer ([place]) 2013   Breast cancer    Radiation therapy   Calcified granuloma of lung (HCC)    Cigarette nicotine dependence    Cigarette nicotine dependence in remission    Cocaine dependence in remission ([place])    COPD (chronic obstructive pulmonary disease) (HCC)    Depression    Disequilibrium    Fibromyalgia    GERD (gastroesophageal reflux disease)    HSV infection    Hypertension    Hypertensive kidney disease with stage 3a chronic kidney disease ([place])    Hypertensive kidney disease with stage 3b chronic kidney disease ([place])    Osteoarthritis    Personal history of infectious disease    Personal history of radiation therapy 2013   Pneumonia    Pre-diabetes    PTSD (post-traumatic stress disorder)    Seasonal allergic rhinitis due to pollen    Sleep apnea    uses CPAP   SOB (shortness of breath)    Vitamin D deficiency    Past Surgical  History:  Procedure Laterality Date   ABDOMINAL HYSTERECTOMY  2018   BREAST LUMPECTOMY Right 2013   GASTRIC BYPASS  2001   TOTAL KNEE ARTHROPLASTY Left 12/22/2018   Procedure: LEFT TOTAL KNEE ARTHROPLASTY;  Surgeon: Leandrew Koyanagi, MD;  Location: [place];  Service: Orthopedics;  Laterality: Left;   Patient Active Problem List   Diagnosis Date Noted   Pain due to onychomycosis of toenails of both feet 01/20/2020   Shortness of breath 10/05/2019   Status post total left knee replacement 12/22/2018   MDD (major depressive disorder), recurrent episode, mild ([place]) 10/29/2018   Autoimmune hepatitis ([place]) 10/29/2018   OSA (obstructive sleep apnea) 10/29/2018   Renal cyst, right 10/22/2018   Lung nodule, solitary 10/22/2018   Genital herpes simplex 10/29/2017   Vitamin D deficiency 10/29/2017   Class 3 severe obesity with serious comorbidity and body mass index (BMI) of 40.0 to 44.9 in adult [facility]) 10/29/2017   Essential hypertension 10/29/2017   Prediabetes 02/14/2017   Fibromyalgia 02/14/2017   Substance abuse in remission ([place]) 02/14/2017   Anxiety and depression 02/14/2017   HX: breast cancer 02/14/2017   Urge incontinence 11/13/2016  Unilateral primary osteoarthritis, left knee 06/14/2016   GERD (gastroesophageal reflux disease) 11/02/2015   Angioedema 10/24/2013   Tobacco abuse 10/24/2013   DCIS (ductal carcinoma in situ) of breast 04/07/2013   Depression 04/07/2013    REFERRING DIAG: ICD-10:Z47.89: Encounter for other orthopedic aftercare. Left shoulder scope SAD/SCR/RCR  THERAPY DIAG:  Stiffness of left shoulder, not elsewhere classified  Muscle weakness (generalized)  Acute pain of left shoulder  Rationale for Evaluation and Treatment Rehabilitation  PERTINENT HISTORY: High BMI, Arthritis, anxiety, COPD  PRECAUTIONS: Shoulder  07/03/22 Surgery   SUBJECTIVE:                                                                                                                                                                                       SUBJECTIVE STATEMENT:  Pt reports the 2 previous days she was having a lot more pain 8/10 for no known reason. Pains meds helped to decrease it and the L shoulder is feeling better   PAIN:  Are you having pain? Yes: NPRS scale: 3/10 Pain location: L shoulder Pain description: ache, constant Aggravating factors: Dressing Relieving factors: Rest, hot shower, medication   OBJECTIVE: (objective measures completed at initial evaluation unless otherwise dated)   DIAGNOSTIC FINDINGS:  None recent for L shoulder in Epic   PATIENT SURVEYS:  FOTO: Perceived function   32%, predicted   58%    COGNITION: Overall cognitive status: Within functional limits for tasks assessed                                  SENSATION: WFL   POSTURE: Forward head c rounded shoulders   UPPER EXTREMITY ROM:    Passive ROM Right eval Left eval Left 07/19/22 07/25/22 LT  Shoulder flexion A140 P60 P80 P105  Shoulder extension        Shoulder abduction A140 P60 P80 P80  Shoulder adduction        Shoulder internal rotation        Shoulder external rotation A70 P40 P40 P50  Elbow flexion        Elbow extension        Wrist flexion        Wrist extension        Wrist ulnar deviation        Wrist radial deviation        Wrist pronation        Wrist supination        (Blank rows = not tested)   UPPER EXTREMITY MMT:  NT MMT Right eval Left eval  Shoulder flexion      Shoulder extension      Shoulder abduction      Shoulder adduction      Shoulder internal rotation      Shoulder external rotation      Middle trapezius      Lower trapezius      Elbow flexion      Elbow extension      Wrist flexion      Wrist extension      Wrist ulnar deviation      Wrist radial deviation      Wrist pronation      Wrist supination      Grip strength (lbs)      (Blank rows = not tested)   SHOULDER SPECIAL TESTS: Impingement  tests:  NT SLAP lesions:  NT Instability tests:  NT Rotator cuff assessment:  NT Biceps assessment:  NT   JOINT MOBILITY TESTING:  NT   PALPATION:  TTP of the L peri-shoulder             TODAY'S TREATMENT:   [facility] Adult PT Treatment:                                                DATE: 07/25/22 Therapeutic Exercise: L Shoulder flexion walk backs x10 5" L shoulder ER table top forward trunk flexion x10 5' Scapular retractions x10 3" Shoulder shrugs x10 3" Pendulum flex/ext and abd/add x20 each Manual Therapy: Passive left shoulder flexion, abduction and ER Modalities: Left shoulder Vaso Low pressure, 34 degrees x 15 minutes      [facility] Adult PT Treatment:                                                DATE: 07/19/22 Therapeutic Exercise: Pendulums Scap retractions Seated chair roll back x 10 Seated ER PROM x 10 Manual Therapy: Passive left shoulder flexion, abduction and ER Modalities: Left shoulder Vaso Low pressure , 34 degrees x 15 minutes                                                                                                                           [facility] Adult PT Treatment:                                                DATE: 07/12/22 Therapeutic Exercise: Developed, instructed in, and pt completed therex as noted in HEP Self Care: Use of cold packs 10-15 mins after HEP and periodically for symptom management Use  of abduction sling   PATIENT EDUCATION: Education details: Eval findings, POC, HEP, self care Person educated: Patient Education method: Explanation, Demonstration, Tactile cues, Verbal cues, and Handouts Education comprehension: verbalized understanding, returned demonstration, verbal cues required, and tactile cues required   HOME EXERCISE PROGRAM: Access Code: [email or web address] URL: [email or web address] Date: 07/12/2022 Prepared by: Gar Ponto   Exercises - Flexion-Extension Shoulder Pendulum with Table Support  - 1 x daily - 7 x weekly  - 1 sets - 20 reps - Horizontal Shoulder Pendulum with Table Support  - 1 x daily - 7 x weekly - 1 sets - 20 reps - Standing Shoulder and Trunk Flexion at Table  - 1 x daily - 7 x weekly - 1 sets - 10 reps - 5 hold - Seated Shoulder External Rotation PROM on Table  - 1 x daily - 7 x weekly - 1 sets - 10 reps - 5 hold   ASSESSMENT:   CLINICAL IMPRESSION: PT was completed for L shoulder ROM and gentle scapular strengthening. L shoulder PROM has improved for flexion and ER, while abd is the same as last week. Reminded pt to use cold packs periodically throughout the day to assist with pain and swelling management. L shoulder PROM is improving appropriately. Pt tolerated PT today without adverse effects.  OBJECTIVE IMPAIRMENTS: decreased activity tolerance, decreased ROM, decreased strength, impaired UE functional use, pain, and heavy arm .    ACTIVITY LIMITATIONS: carrying, lifting, sleeping, bed mobility, bathing, toileting, dressing, reach over head, and caring for others   PARTICIPATION LIMITATIONS: meal prep, cleaning, laundry, and driving   PERSONAL FACTORS: Age, Fitness, Past/current experiences, Social background, and 1 comorbidity: High BMI-heavy arm  are also affecting patient's functional outcome.    REHAB POTENTIAL: Good   CLINICAL DECISION MAKING: Evolving/moderate complexity   EVALUATION COMPLEXITY: Moderate     GOALS:   SHORT TERM GOALS: Target date: 07/27/22   Pt will be Ind in an initial HEP  Baseline: Goal status: INITIAL   LONG TERM GOALS: Target date: 09/14/22   Pt will be Ind in a final HEP to maintain achieved LOF Baseline: initiated Goal status: INITIAL   2.  Improve L shoulder PROM to flexion 140, abd 120, ER 50  Baseline: see flow sheet Goal status: INITIAL   3.  Pt's FOTO score will improved to the predicted value of 58% as indication of improved function by DC anticpated to be after 09/14/22 Baseline: 32% Goal status: INITIAL   4.  Develop LTGs after  3//22/24 date when pt is able to particiapte in AROM and strengthening therex Baseline:  Goal status: INITIAL   PLAN:   PT FREQUENCY: 1-2x/week   PT DURATION: 8 weeks   PLANNED INTERVENTIONS: Therapeutic exercises, Therapeutic activity, Patient/Family education, Self Care, Joint mobilization, Aquatic Therapy, Dry Needling, Electrical stimulation, Cryotherapy, Moist heat, Taping, Vasopneumatic device, Ultrasound, Ionotophoresis '4mg'$ /ml Dexamethasone, Manual therapy, and Re-evaluation   PLAN FOR NEXT SESSION: Review FOTO; assess response to HEP; progress therex as indicated; use of modalities, manual therapy; and TPDN as indicated.   Lulu Hirschmann MS, PT 07/25/22 12:51 PM

## 2022-07-26 ENCOUNTER — Ambulatory Visit: Payer: Medicare HMO | Attending: Specialist

## 2022-07-26 DIAGNOSIS — M6281 Muscle weakness (generalized): Secondary | ICD-10-CM

## 2022-07-26 DIAGNOSIS — M25612 Stiffness of left shoulder, not elsewhere classified: Secondary | ICD-10-CM

## 2022-07-26 DIAGNOSIS — M25512 Pain in left shoulder: Secondary | ICD-10-CM | POA: Diagnosis not present

## 2022-07-26 DIAGNOSIS — R6889 Other general symptoms and signs: Secondary | ICD-10-CM | POA: Diagnosis not present

## 2022-07-26 NOTE — Therapy (Signed)
OUTPATIENT PHYSICAL THERAPY TREATMENT NOTE   Patient Name: Rachel Woodard MRN: [id number] DOB:February 01, 1951, 72 y.o., female Today's Date: 07/26/2022  PCP: Scheryl Marten, [place]   REFERRING PROVIDER: Sydnee Cabal, MD   END OF SESSION:   PT End of Session - 07/26/22 1204     Visit Number 4    Number of Visits 17    Date for PT Re-Evaluation 09/14/22    Authorization Type [employer] MEDICARE HMO;  [place] Time Period 07/18/22-09/01/22    Authorization - Visit Number 3    Authorization - Number of Visits 12    Progress Note Due on Visit 10    PT Start Time 1152    PT Stop Time 1239    PT Time Calculation (min) 47 min    Equipment Utilized During Treatment Other (comment)   sling   Activity Tolerance Patient tolerated treatment well    Behavior During Therapy WFL for tasks assessed/performed               Past Medical History:  Diagnosis Date   Anemia 2001   after gastric bypass   Anxiety    Aortic atherosclerosis (HCC)    Arthritis    Back pain    Bilateral swelling of feet    Bipolar 1 disorder ([place])    Breast cancer ([place]) 2013   Breast cancer    Radiation therapy   Calcified granuloma of lung (HCC)    Cigarette nicotine dependence    Cigarette nicotine dependence in remission    Cocaine dependence in remission ([place])    COPD (chronic obstructive pulmonary disease) ([place])    Depression    Disequilibrium    Fibromyalgia    GERD (gastroesophageal reflux disease)    HSV infection    Hypertension    Hypertensive kidney disease with stage 3a chronic kidney disease ([place])    Hypertensive kidney disease with stage 3b chronic kidney disease ([place])    Osteoarthritis    Personal history of infectious disease    Personal history of radiation therapy 2013   Pneumonia    Pre-diabetes    PTSD (post-traumatic stress disorder)    Seasonal allergic rhinitis due to pollen    Sleep apnea    uses CPAP   SOB (shortness of breath)     Vitamin D deficiency    Past Surgical History:  Procedure Laterality Date   ABDOMINAL HYSTERECTOMY  2018   BREAST LUMPECTOMY Right 2013   GASTRIC BYPASS  2001   TOTAL KNEE ARTHROPLASTY Left 12/22/2018   Procedure: LEFT TOTAL KNEE ARTHROPLASTY;  Surgeon: Leandrew Koyanagi, MD;  Location: [place];  Service: Orthopedics;  Laterality: Left;   Patient Active Problem List   Diagnosis Date Noted   Pain due to onychomycosis of toenails of both feet 01/20/2020   Shortness of breath 10/05/2019   Status post total left knee replacement 12/22/2018   MDD (major depressive disorder), recurrent episode, mild ([place]) 10/29/2018   Autoimmune hepatitis ([place]) 10/29/2018   OSA (obstructive sleep apnea) 10/29/2018   Renal cyst, right 10/22/2018   Lung nodule, solitary 10/22/2018   Genital herpes simplex 10/29/2017   Vitamin D deficiency 10/29/2017   Class 3 severe obesity with serious comorbidity and body mass index (BMI) of 40.0 to 44.9 in adult [facility]) 10/29/2017   Essential hypertension 10/29/2017   Prediabetes 02/14/2017   Fibromyalgia 02/14/2017   Substance abuse in remission ([place]) 02/14/2017   Anxiety and depression 02/14/2017   HX:  breast cancer 02/14/2017   Urge incontinence 11/13/2016   Unilateral primary osteoarthritis, left knee 06/14/2016   GERD (gastroesophageal reflux disease) 11/02/2015   Angioedema 10/24/2013   Tobacco abuse 10/24/2013   DCIS (ductal carcinoma in situ) of breast 04/07/2013   Depression 04/07/2013    REFERRING DIAG: ICD-10:Z47.89: Encounter for other orthopedic aftercare. Left shoulder scope SAD/SCR/RCR  THERAPY DIAG:  Stiffness of left shoulder, not elsewhere classified  Muscle weakness (generalized)  Acute pain of left shoulder  Rationale for Evaluation and Treatment Rehabilitation  PERTINENT HISTORY: High BMI, Arthritis, anxiety, COPD  PRECAUTIONS: Shoulder  07/03/22 Surgery   SUBJECTIVE:                                                                                                                                                                                       SUBJECTIVE STATEMENT:  Pt reports her L shoulder pain has been at a good level after yesterday's session   PAIN:  Are you having pain? Yes: NPRS scale: 3/10 Pain location: L shoulder Pain description: ache, constant Aggravating factors: Dressing Relieving factors: Rest, hot shower, medication   OBJECTIVE: (objective measures completed at initial evaluation unless otherwise dated)   DIAGNOSTIC FINDINGS:  None recent for L shoulder in Epic   PATIENT SURVEYS:  FOTO: Perceived function   32%, predicted   58%    COGNITION: Overall cognitive status: Within functional limits for tasks assessed                                  SENSATION: WFL   POSTURE: Forward head c rounded shoulders   UPPER EXTREMITY ROM:    Passive ROM Right eval Left eval Left 07/19/22 07/25/22 LT LT 07/26/21  Shoulder flexion A140 P60 P80 P105 P115  Shoulder extension         Shoulder abduction A140 P60 P80 P80   Shoulder adduction         Shoulder internal rotation         Shoulder external rotation A70 P40 P40 P50   Elbow flexion         Elbow extension         Wrist flexion         Wrist extension         Wrist ulnar deviation         Wrist radial deviation         Wrist pronation         Wrist supination         (Blank rows = not tested)   UPPER EXTREMITY MMT:  NT MMT Right eval Left eval  Shoulder flexion      Shoulder extension      Shoulder abduction      Shoulder adduction      Shoulder internal rotation      Shoulder external rotation      Middle trapezius      Lower trapezius      Elbow flexion      Elbow extension      Wrist flexion      Wrist extension      Wrist ulnar deviation      Wrist radial deviation      Wrist pronation      Wrist supination      Grip strength (lbs)      (Blank rows = not tested)   SHOULDER SPECIAL TESTS: Impingement tests:   NT SLAP lesions:  NT Instability tests:  NT Rotator cuff assessment:  NT Biceps assessment:  NT   JOINT MOBILITY TESTING:  NT   PALPATION:  TTP of the L peri-shoulder             TODAY'S TREATMENT:  [facility] Adult PT Treatment:                                                DATE: 07/26/22 Therapeutic Exercise: L Shoulder flexion walk backs x10 5" L shoulder ER table top forward trunk flexion x10 5' Scapular retractions 2x10 3" Shoulder shrugs 2x10 3" Pendulum flex/ext and abd/add x20 each Manual Therapy: Passive left shoulder flexion, abduction and ER Modalities: Left shoulder Vaso Low pressure, 34 degrees x 15 minutes    [facility] Adult PT Treatment:                                                DATE: 07/25/22 Therapeutic Exercise: L Shoulder flexion walk backs x10 5" L shoulder ER table top forward trunk flexion x10 5' Scapular retractions x10 3" Shoulder shrugs x10 3" Pendulum flex/ext and abd/add x20 each Manual Therapy: Passive left shoulder flexion, abduction and ER Modalities: Left shoulder Vaso Low pressure, 34 degrees x 15 minutes      [facility] Adult PT Treatment:                                                DATE: 07/19/22 Therapeutic Exercise: Pendulums Scap retractions Seated chair roll back x 10 Seated ER PROM x 10 Manual Therapy: Passive left shoulder flexion, abduction and ER Modalities: Left shoulder Vaso Low pressure , 34 degrees x 15 minutes  [facility] Adult PT Treatment:                                                DATE: 07/12/22 Therapeutic Exercise: Developed, instructed in, and pt completed therex as noted in HEP Self Care: Use of cold packs 10-15 mins after HEP and periodically for symptom management Use of abduction sling   PATIENT EDUCATION: Education details: Eval findings, POC, HEP, self care Person educated: Patient Education  method: Explanation, Demonstration, Tactile cues, Verbal cues, and Handouts Education comprehension: verbalized understanding, returned demonstration, verbal cues required, and tactile cues required   HOME EXERCISE PROGRAM: Access Code: [email or web address] URL: [email or web address] Date: 07/12/2022 Prepared by: Gar Ponto   Exercises - Flexion-Extension Shoulder Pendulum with Table Support  - 1 x daily - 7 x weekly - 1 sets - 20 reps - Horizontal Shoulder Pendulum with Table Support  - 1 x daily - 7 x weekly - 1 sets - 20 reps - Standing Shoulder and Trunk Flexion at Table  - 1 x daily - 7 x weekly - 1 sets - 10 reps - 5 hold - Seated Shoulder External Rotation PROM on Table  - 1 x daily - 7 x weekly - 1 sets - 10 reps - 5 hold   ASSESSMENT:   CLINICAL IMPRESSION: PT was completed for L shoulder/scapular ROM and gentle scapular strengthening. L shoulder flexion PROM increased from 105 to 115d. Pt tolerated PT today without adverse effects. Pt is completing her HEP properly. Pt will continue to benefit from skilled PT to address impairments for improved L shoulder/UE function with less pain.  OBJECTIVE IMPAIRMENTS: decreased activity tolerance, decreased ROM, decreased strength, impaired UE functional use, pain, and heavy arm .    ACTIVITY LIMITATIONS: carrying, lifting, sleeping, bed mobility, bathing, toileting, dressing, reach over head, and caring for others   PARTICIPATION LIMITATIONS: meal prep, cleaning, laundry, and driving   PERSONAL FACTORS: Age, Fitness, Past/current experiences, Social background, and 1 comorbidity: High BMI-heavy arm  are also affecting patient's functional outcome.    REHAB POTENTIAL: Good   CLINICAL DECISION MAKING: Evolving/moderate complexity   EVALUATION COMPLEXITY: Moderate     GOALS:   SHORT TERM GOALS: Target date: 07/27/22   Pt will be Ind in an initial HEP  Baseline: Goal status:07/26/22=MET   LONG TERM GOALS: Target date: 09/14/22    Pt will be Ind in a final HEP to maintain achieved LOF Baseline: initiated Goal status: INITIAL   2.  Improve L shoulder PROM to flexion 140, abd 120, ER 50  Baseline: see flow sheet Goal status: INITIAL   3.  Pt's FOTO score will improved to the predicted value of 58% as indication of improved function by DC anticpated to be after 09/14/22 Baseline: 32% Goal status: INITIAL   4.  Develop LTGs after 3//22/24 date when pt is able to particiapte in AROM and strengthening therex Baseline:  Goal status: INITIAL   PLAN:   PT FREQUENCY: 1-2x/week   PT DURATION: 8 weeks   PLANNED INTERVENTIONS: Therapeutic exercises, Therapeutic activity, Patient/Family education, Self Care, Joint mobilization, Aquatic Therapy, Dry Needling, Electrical stimulation, Cryotherapy, Moist heat, Taping, Vasopneumatic device, Ultrasound, Ionotophoresis '4mg'$ /ml Dexamethasone, Manual therapy, and Re-evaluation   PLAN FOR NEXT SESSION: Review FOTO; assess response to HEP; progress therex as indicated; use of modalities, manual therapy; and TPDN as indicated.   Tahisha Hakim  MS, PT 07/26/22 12:39 PM

## 2022-07-26 NOTE — Progress Notes (Signed)
Chief Complaint:   OBESITY Rachel Woodard is here to discuss her progress with her obesity treatment plan along with follow-up of her obesity related diagnoses. Rachel Woodard is on the Category 3 Plan and states she is following her eating plan approximately 70% of the time. Rachel Woodard states she is walking 45 minutes 1 times per week.  Today's visit was #: 20 Starting weight: 258 lbs Starting date: 03/14/2021 Today's weight: 252 lbs Today's date: 07/17/2022 Total lbs lost to date: 6 lbs Total lbs lost since last in-office visit: 0  Interim History: Rachel Woodard had left shoulder surgery since her last visit.  She has not been able to cook as much since surgery.  Does not feel she is meeting her calories or protein goals.  Going to Physical therapy twice a week.  Taking Vit D, Calcium, B12.  Denies taking a multivitamin or iron.  Subjective:   1. Vitamin D deficiency Labs discussed during visit today.  Rachel Woodard is currently taking prescription Vit D 50,000 IU once a week.   Denies any nausea, vomiting or muscle weakness.  2. Prediabetes Labs discussed during visit today.  Last A1c was 6.0.  Took Metformin in the past and stopped due to side effects.  Struggles with polyphagia and cravings.   Assessment/Plan:   1. Vitamin D deficiency We will refill Vit D 50K IU once a week for 1 month with 0 refills.  Side effects discussed.   -Refill Vitamin D, Ergocalciferol, (DRISDOL) 1.25 MG (50000 UNIT) CAPS capsule; Take 1 capsule (50,000 Units total) by mouth every 7 (seven) days.  Dispense: 4 capsule; Refill: 0  2. Prediabetes Rachel Woodard will continue to work on weight loss, exercise, and decreasing simple carbohydrates to help decrease the risk of diabetes.    3. Obesity, current BMI 40.7 Judy is currently in the action stage of change. As such, her goal is to continue with weight loss efforts. She has agreed to practicing portion control and making smarter food choices, such as increasing vegetables and  decreasing simple carbohydrates.   Exercise goals: As is.  Physical Therapy twice per week.  Labs reviewed in chart with patient.  Behavioral modification strategies: increasing lean protein intake, increasing water intake, and planning for success.  Rachel Woodard has agreed to follow-up with our clinic in 4 weeks. She was informed of the importance of frequent follow-up visits to maximize her success with intensive lifestyle modifications for her multiple health conditions.   Objective:   Blood pressure 110/69, pulse 75, temperature 98 F (36.7 C), height '5\' 6"'$  (1.676 m), SpO2 99 %. Body mass index is 40.19 kg/m.  General: Cooperative, alert, well developed, in no acute distress. HEENT: Conjunctivae and lids unremarkable. Cardiovascular: Regular rhythm.  Lungs: Normal work of breathing. Neurologic: No focal deficits.   Lab Results  Component Value Date   CREATININE 1.18 (H) 06/13/2022   BUN 27 06/13/2022   NA 138 06/13/2022   K 4.6 06/13/2022   CL 99 06/13/2022   CO2 25 06/13/2022   Lab Results  Component Value Date   ALT 17 06/13/2022   AST 13 06/13/2022   ALKPHOS 114 06/13/2022   BILITOT 0.2 06/13/2022   Lab Results  Component Value Date   HGBA1C 6.0 (H) 06/13/2022   HGBA1C 6.0 (H) 03/14/2021   HGBA1C 5.7 (H) 11/11/2018   HGBA1C 5.8 (H) 04/24/2018   HGBA1C 5.7 (H) 01/22/2018   Lab Results  Component Value Date   INSULIN 14.4 03/14/2021   Lab Results  Component Value  Date   TSH 3.320 03/14/2021   Lab Results  Component Value Date   CHOL 170 03/14/2021   HDL 53 03/14/2021   LDLCALC 99 03/14/2021   TRIG 99 03/14/2021   CHOLHDL 3.1 07/11/2017   Lab Results  Component Value Date   VD25OH 36.9 06/13/2022   VD25OH 33.0 03/14/2021   VD25OH 27.7 (L) 04/24/2018   Lab Results  Component Value Date   WBC 7.6 03/14/2021   HGB 13.3 03/14/2021   HCT 40.5 03/14/2021   MCV 88 03/14/2021   PLT 255 03/14/2021   Lab Results  Component Value Date   FERRITIN 17  06/13/2022   Attestation Statements:   Reviewed by clinician on day of visit: allergies, medications, problem list, medical history, surgical history, family history, social history, and previous encounter notes.  I spent 30 minutes with the patient and reviewing her chart before and after her visit.    I, Brendell Tyus, RMA, am acting as transcriptionist for Everardo Pacific, FNP.  I have reviewed the above documentation for accuracy and completeness, and I agree with the above. Everardo Pacific, FNP

## 2022-07-31 DIAGNOSIS — F339 Major depressive disorder, recurrent, unspecified: Secondary | ICD-10-CM | POA: Diagnosis not present

## 2022-07-31 DIAGNOSIS — F1421 Cocaine dependence, in remission: Secondary | ICD-10-CM | POA: Diagnosis not present

## 2022-08-01 ENCOUNTER — Ambulatory Visit: Payer: Medicare HMO

## 2022-08-01 DIAGNOSIS — M6281 Muscle weakness (generalized): Secondary | ICD-10-CM

## 2022-08-01 DIAGNOSIS — M25512 Pain in left shoulder: Secondary | ICD-10-CM | POA: Diagnosis not present

## 2022-08-01 DIAGNOSIS — M25612 Stiffness of left shoulder, not elsewhere classified: Secondary | ICD-10-CM

## 2022-08-01 DIAGNOSIS — R6889 Other general symptoms and signs: Secondary | ICD-10-CM | POA: Diagnosis not present

## 2022-08-01 NOTE — Therapy (Signed)
OUTPATIENT PHYSICAL THERAPY TREATMENT NOTE   Patient Name: Rachel Woodard MRN: [id number] DOB:Aug 07, 1950, 72 y.o., female Today's Date: 08/01/2022  PCP: Scheryl Marten, [place]   REFERRING PROVIDER: Sydnee Cabal, MD   END OF SESSION:   PT End of Session - 08/01/22 1156     Visit Number 5    Number of Visits 17    Date for PT Re-Evaluation 09/14/22    Authorization Type [employer] MEDICARE HMO;  [place] Time Period 07/18/22-09/01/22    Authorization - Visit Number 4    Authorization - Number of Visits 12    PT Start Time 3154    PT Stop Time 0086    PT Time Calculation (min) 50 min    Equipment Utilized During Treatment Other (comment)   sling   Activity Tolerance Patient tolerated treatment well    Behavior During Therapy WFL for tasks assessed/performed               Past Medical History:  Diagnosis Date   Anemia 2001   after gastric bypass   Anxiety    Aortic atherosclerosis (HCC)    Arthritis    Back pain    Bilateral swelling of feet    Bipolar 1 disorder ([place])    Breast cancer ([place]) 2013   Breast cancer    Radiation therapy   Calcified granuloma of lung (HCC)    Cigarette nicotine dependence    Cigarette nicotine dependence in remission    Cocaine dependence in remission ([place])    COPD (chronic obstructive pulmonary disease) (HCC)    Depression    Disequilibrium    Fibromyalgia    GERD (gastroesophageal reflux disease)    HSV infection    Hypertension    Hypertensive kidney disease with stage 3a chronic kidney disease ([place])    Hypertensive kidney disease with stage 3b chronic kidney disease ([place])    Osteoarthritis    Personal history of infectious disease    Personal history of radiation therapy 2013   Pneumonia    Pre-diabetes    PTSD (post-traumatic stress disorder)    Seasonal allergic rhinitis due to pollen    Sleep apnea    uses CPAP   SOB (shortness of breath)    Vitamin D deficiency    Past  Surgical History:  Procedure Laterality Date   ABDOMINAL HYSTERECTOMY  2018   BREAST LUMPECTOMY Right 2013   GASTRIC BYPASS  2001   TOTAL KNEE ARTHROPLASTY Left 12/22/2018   Procedure: LEFT TOTAL KNEE ARTHROPLASTY;  Surgeon: Leandrew Koyanagi, MD;  Location: [facility];  Service: Orthopedics;  Laterality: Left;   Patient Active Problem List   Diagnosis Date Noted   Pain due to onychomycosis of toenails of both feet 01/20/2020   Shortness of breath 10/05/2019   Status post total left knee replacement 12/22/2018   MDD (major depressive disorder), recurrent episode, mild ([place]) 10/29/2018   Autoimmune hepatitis ([place]) 10/29/2018   OSA (obstructive sleep apnea) 10/29/2018   Renal cyst, right 10/22/2018   Lung nodule, solitary 10/22/2018   Genital herpes simplex 10/29/2017   Vitamin D deficiency 10/29/2017   Class 3 severe obesity with serious comorbidity and body mass index (BMI) of 40.0 to 44.9 in adult [facility]) 10/29/2017   Essential hypertension 10/29/2017   Prediabetes 02/14/2017   Fibromyalgia 02/14/2017   Substance abuse in remission ([place]) 02/14/2017   Anxiety and depression 02/14/2017   HX: breast cancer 02/14/2017   Urge incontinence 11/13/2016  Unilateral primary osteoarthritis, left knee 06/14/2016   GERD (gastroesophageal reflux disease) 11/02/2015   Angioedema 10/24/2013   Tobacco abuse 10/24/2013   DCIS (ductal carcinoma in situ) of breast 04/07/2013   Depression 04/07/2013    REFERRING DIAG: ICD-10:Z47.89: Encounter for other orthopedic aftercare. Left shoulder scope SAD/SCR/RCR  THERAPY DIAG:  Acute pain of left shoulder  Stiffness of left shoulder, not elsewhere classified  Muscle weakness (generalized)  Rationale for Evaluation and Treatment Rehabilitation  PERTINENT HISTORY: High BMI, Arthritis, anxiety, COPD  PRECAUTIONS: Shoulder  07/03/22 Surgery   SUBJECTIVE:                                                                                                                                                                                     SUBJECTIVE STATEMENT:  Pt reports her oxycodone ran out yesterday and her L shoulder has been bothering more. Pt reports she is now taking aleve for pain.   PAIN:  Are you having pain? Yes: NPRS scale: 5/10 Pain location: L shoulder Pain description: ache, constant Aggravating factors: Dressing Relieving factors: Rest, hot shower, medication   OBJECTIVE: (objective measures completed at initial evaluation unless otherwise dated)   DIAGNOSTIC FINDINGS:  None recent for L shoulder in Epic   PATIENT SURVEYS:  FOTO: Perceived function   32%, predicted   58%    COGNITION: Overall cognitive status: Within functional limits for tasks assessed                                  SENSATION: WFL   POSTURE: Forward head c rounded shoulders   UPPER EXTREMITY ROM:    Passive ROM Right eval Left eval Left 07/19/22 07/25/22 LT LT 07/26/22 LT 08/01/22  Shoulder flexion A140 P60 P80 P105 P115 P125  Shoulder extension          Shoulder abduction A140 P60 P80 P80  100P  Shoulder adduction          Shoulder internal rotation          Shoulder external rotation A70 P40 P40 P50  P60  Elbow flexion          Elbow extension          Wrist flexion          Wrist extension          Wrist ulnar deviation          Wrist radial deviation          Wrist pronation          Wrist supination          (  Blank rows = not tested)   UPPER EXTREMITY MMT:            NT MMT Right eval Left eval  Shoulder flexion      Shoulder extension      Shoulder abduction      Shoulder adduction      Shoulder internal rotation      Shoulder external rotation      Middle trapezius      Lower trapezius      Elbow flexion      Elbow extension      Wrist flexion      Wrist extension      Wrist ulnar deviation      Wrist radial deviation      Wrist pronation      Wrist supination      Grip strength (lbs)      (Blank rows = not  tested)   SHOULDER SPECIAL TESTS: Impingement tests:  NT SLAP lesions:  NT Instability tests:  NT Rotator cuff assessment:  NT Biceps assessment:  NT   JOINT MOBILITY TESTING:  NT   PALPATION:  TTP of the L peri-shoulder             TODAY'S TREATMENT:   [facility] Adult PT Treatment:                                                DATE: 08/01/22 Therapeutic Exercise: Pendulum flex/ext and abd/add x20 each Scapular retractions 2x10 3" Shoulder shrugs 2x10 3" L Shoulder flexion walk backs x10 5" L shoulder ER table top forward trunk flexion x10 5' PROM measures Manual Therapy: Passive left shoulder flexion, abduction and ER Modalities: Left shoulder Vaso Low pressure , 34 degrees x 15 minutes   [facility] Adult PT Treatment:                                                DATE: 07/26/22 Therapeutic Exercise: L Shoulder flexion walk backs x10 5" L shoulder ER table top forward trunk flexion x10 5' Scapular retractions 2x10 3" Shoulder shrugs 2x10 3" Pendulum flex/ext and abd/add x20 each Manual Therapy: Passive left shoulder flexion, abduction and ER Modalities: Left shoulder Vaso Low pressure, 34 degrees x 15 minutes    [facility] Adult PT Treatment:                                                DATE: 07/25/22 Therapeutic Exercise: L Shoulder flexion walk backs x10 5" L shoulder ER table top forward trunk flexion x10 5' Scapular retractions x10 3" Shoulder shrugs x10 3" Pendulum flex/ext and abd/add x20 each Manual Therapy: Passive left shoulder flexion, abduction and ER Modalities: Left shoulder Vaso Low pressure, 34 degrees x 15 minutes       PATIENT EDUCATION: Education details: Eval findings, POC, HEP, self care Person educated: Patient Education method: Explanation, Demonstration, Tactile cues, Verbal cues, and Handouts Education comprehension: verbalized understanding, returned demonstration, verbal cues required, and tactile cues required   HOME EXERCISE PROGRAM: Access  Code: [email or web address] URL: [email or web address] Date: 07/12/2022 Prepared by: Zenia Resides  Maya Arcand   Exercises - Flexion-Extension Shoulder Pendulum with Table Support  - 1 x daily - 7 x weekly - 1 sets - 20 reps - Horizontal Shoulder Pendulum with Table Support  - 1 x daily - 7 x weekly - 1 sets - 20 reps - Standing Shoulder and Trunk Flexion at Table  - 1 x daily - 7 x weekly - 1 sets - 10 reps - 5 hold - Seated Shoulder External Rotation PROM on Table  - 1 x daily - 7 x weekly - 1 sets - 10 reps - 5 hold   ASSESSMENT:   CLINICAL IMPRESSION: Pt is 4 weeks s/p Left shoulder scope SAD/SCR/RCR. PT was completed for L shoulder/scapular ROM and gentle scapular strengthening. Vaso was then provided for symptom management. PROM for the L shoulder continues to make appropriate progress for all motions. Pt tolerated PT today without adverse effects. Will initiate AAROM for the L shoulder the next PT session.   OBJECTIVE IMPAIRMENTS: decreased activity tolerance, decreased ROM, decreased strength, impaired UE functional use, pain, and heavy arm .    ACTIVITY LIMITATIONS: carrying, lifting, sleeping, bed mobility, bathing, toileting, dressing, reach over head, and caring for others   PARTICIPATION LIMITATIONS: meal prep, cleaning, laundry, and driving   PERSONAL FACTORS: Age, Fitness, Past/current experiences, Social background, and 1 comorbidity: High BMI-heavy arm  are also affecting patient's functional outcome.    REHAB POTENTIAL: Good   CLINICAL DECISION MAKING: Evolving/moderate complexity   EVALUATION COMPLEXITY: Moderate     GOALS:   SHORT TERM GOALS: Target date: 07/27/22   Pt will be Ind in an initial HEP  Baseline: Goal status:07/26/22=MET   LONG TERM GOALS: Target date: 09/14/22   Pt will be Ind in a final HEP to maintain achieved LOF Baseline: initiated Goal status: INITIAL   2.  Improve L shoulder PROM to flexion 140, abd 120, ER 50  Baseline: see flow sheet Status:  08/01/22=see flow sheets Goal status: 07/29/22=Improving   3.  Pt's FOTO score will improved to the predicted value of 58% as indication of improved function by DC anticpated to be after 09/14/22 Baseline: 32% Goal status: INITIAL   4.  Develop LTGs after 3//22/24 date when pt is able to particiapte in AROM and strengthening therex Baseline:  Goal status: INITIAL   PLAN:   PT FREQUENCY: 1-2x/week   PT DURATION: 8 weeks   PLANNED INTERVENTIONS: Therapeutic exercises, Therapeutic activity, Patient/Family education, Self Care, Joint mobilization, Aquatic Therapy, Dry Needling, Electrical stimulation, Cryotherapy, Moist heat, Taping, Vasopneumatic device, Ultrasound, Ionotophoresis '4mg'$ /ml Dexamethasone, Manual therapy, and Re-evaluation   PLAN FOR NEXT SESSION: Review FOTO; assess response to HEP; progress therex as indicated; use of modalities, manual therapy; and TPDN as indicated.   Areil Ottey MS, PT 08/01/22 1:31 PM

## 2022-08-02 ENCOUNTER — Ambulatory Visit: Payer: Medicare HMO

## 2022-08-02 NOTE — Therapy (Signed)
OUTPATIENT PHYSICAL THERAPY TREATMENT NOTE   Patient Name: Rachel Woodard MRN: [id number] DOB:01-03-1951, 72 y.o., female Today's Date: 08/03/2022  PCP: Scheryl Marten, [place]   REFERRING PROVIDER: Sydnee Cabal, MD   END OF SESSION:   PT End of Session - 08/03/22 0813     Visit Number 6    Number of Visits 17    Date for PT Re-Evaluation 09/14/22    Authorization Type [employer] MEDICARE HMO;  [place] Time Period 07/18/22-09/01/22    Authorization - Visit Number 5    Authorization - Number of Visits 12    Progress Note Due on Visit 10    PT Start Time 0805    PT Stop Time 0848    PT Time Calculation (min) 43 min    Equipment Utilized During Treatment Other (comment)   sling   Activity Tolerance Patient tolerated treatment well    Behavior During Therapy WFL for tasks assessed/performed                Past Medical History:  Diagnosis Date   Anemia 2001   after gastric bypass   Anxiety    Aortic atherosclerosis (HCC)    Arthritis    Back pain    Bilateral swelling of feet    Bipolar 1 disorder ([place])    Breast cancer ([place]) 2013   Breast cancer    Radiation therapy   Calcified granuloma of lung (HCC)    Cigarette nicotine dependence    Cigarette nicotine dependence in remission    Cocaine dependence in remission ([place])    COPD (chronic obstructive pulmonary disease) (HCC)    Depression    Disequilibrium    Fibromyalgia    GERD (gastroesophageal reflux disease)    HSV infection    Hypertension    Hypertensive kidney disease with stage 3a chronic kidney disease ([place])    Hypertensive kidney disease with stage 3b chronic kidney disease ([place])    Osteoarthritis    Personal history of infectious disease    Personal history of radiation therapy 2013   Pneumonia    Pre-diabetes    PTSD (post-traumatic stress disorder)    Seasonal allergic rhinitis due to pollen    Sleep apnea    uses CPAP   SOB (shortness of breath)     Vitamin D deficiency    Past Surgical History:  Procedure Laterality Date   ABDOMINAL HYSTERECTOMY  2018   BREAST LUMPECTOMY Right 2013   GASTRIC BYPASS  2001   TOTAL KNEE ARTHROPLASTY Left 12/22/2018   Procedure: LEFT TOTAL KNEE ARTHROPLASTY;  Surgeon: Leandrew Koyanagi, MD;  Location: [facility];  Service: Orthopedics;  Laterality: Left;   Patient Active Problem List   Diagnosis Date Noted   Pain due to onychomycosis of toenails of both feet 01/20/2020   Shortness of breath 10/05/2019   Status post total left knee replacement 12/22/2018   MDD (major depressive disorder), recurrent episode, mild ([place]) 10/29/2018   Autoimmune hepatitis ([place]) 10/29/2018   OSA (obstructive sleep apnea) 10/29/2018   Renal cyst, right 10/22/2018   Lung nodule, solitary 10/22/2018   Genital herpes simplex 10/29/2017   Vitamin D deficiency 10/29/2017   Class 3 severe obesity with serious comorbidity and body mass index (BMI) of 40.0 to 44.9 in adult [facility]) 10/29/2017   Essential hypertension 10/29/2017   Prediabetes 02/14/2017   Fibromyalgia 02/14/2017   Substance abuse in remission ([place]) 02/14/2017   Anxiety and depression 02/14/2017  HX: breast cancer 02/14/2017   Urge incontinence 11/13/2016   Unilateral primary osteoarthritis, left knee 06/14/2016   GERD (gastroesophageal reflux disease) 11/02/2015   Angioedema 10/24/2013   Tobacco abuse 10/24/2013   DCIS (ductal carcinoma in situ) of breast 04/07/2013   Depression 04/07/2013    REFERRING DIAG: ICD-10:Z47.89: Encounter for other orthopedic aftercare. Left shoulder scope SAD/SCR/RCR  THERAPY DIAG:  Acute pain of left shoulder  Stiffness of left shoulder, not elsewhere classified  Muscle weakness (generalized)  Rationale for Evaluation and Treatment Rehabilitation  PERTINENT HISTORY: High BMI, Arthritis, anxiety, COPD  PRECAUTIONS: Shoulder  07/03/22 Surgery   SUBJECTIVE:                                                                                                                                                                                     SUBJECTIVE STATEMENT:  Pt reports she woke up yesterday AM without pain.   PAIN:  Are you having pain? Yes: NPRS scale: 5/10 Pain location: L shoulder Pain description: ache, constant Aggravating factors: Dressing Relieving factors: Rest, hot shower, medication   OBJECTIVE: (objective measures completed at initial evaluation unless otherwise dated)   DIAGNOSTIC FINDINGS:  None recent for L shoulder in Epic   PATIENT SURVEYS:  FOTO: Perceived function   32%, predicted   58%    COGNITION: Overall cognitive status: Within functional limits for tasks assessed                                  SENSATION: WFL   POSTURE: Forward head c rounded shoulders   UPPER EXTREMITY ROM:    Passive ROM Right eval Left eval Left 07/19/22 07/25/22 LT LT 07/26/22 LT 08/01/22  Shoulder flexion A140 P60 P80 P105 P115 P125  Shoulder extension          Shoulder abduction A140 P60 P80 P80  100P  Shoulder adduction          Shoulder internal rotation          Shoulder external rotation A70 P40 P40 P50  P60  Elbow flexion          Elbow extension          Wrist flexion          Wrist extension          Wrist ulnar deviation          Wrist radial deviation          Wrist pronation          Wrist supination          (Blank rows =  not tested)   UPPER EXTREMITY MMT:            NT MMT Right eval Left eval  Shoulder flexion      Shoulder extension      Shoulder abduction      Shoulder adduction      Shoulder internal rotation      Shoulder external rotation      Middle trapezius      Lower trapezius      Elbow flexion      Elbow extension      Wrist flexion      Wrist extension      Wrist ulnar deviation      Wrist radial deviation      Wrist pronation      Wrist supination      Grip strength (lbs)      (Blank rows = not tested)   SHOULDER SPECIAL TESTS: Impingement tests:   NT SLAP lesions:  NT Instability tests:  NT Rotator cuff assessment:  NT Biceps assessment:  NT   JOINT MOBILITY TESTING:  NT   PALPATION:  TTP of the L peri-shoulder  Next MD appt: 08/13/22             TODAY'S TREATMENT:  [facility] Adult PT Treatment:                                                DATE: 08/03/22 Therapeutic Exercise: Pendulum flex/ext and abd/add x20 each Seated table shoulder AA flexion 2x10  Seated table shoulder AA scaption 2x10  Seated shoulder AA ER c wand  2x10 Scapular retractions 2x10 3" Shoulder shrugs 2x10 3" Manual Therapy: Passive left shoulder flexion, abduction and ER Modalities: Left shoulder Vaso Low pressure , 34 degrees x 15 minutes   [facility] Adult PT Treatment:                                                DATE: 08/01/22 Therapeutic Exercise: Pendulum flex/ext and abd/add x20 each Scapular retractions 2x10 3" Shoulder shrugs 2x10 3" L Shoulder flexion walk backs x10 5" L shoulder ER table top forward trunk flexion x10 5' PROM measures Manual Therapy: Passive left shoulder flexion, abduction and ER Modalities: Left shoulder Vaso Low pressure , 34 degrees x 15 minutes   [facility] Adult PT Treatment:                                                DATE: 07/26/22 Therapeutic Exercise: L Shoulder flexion walk backs x10 5" L shoulder ER table top forward trunk flexion x10 5' Scapular retractions 2x10 3" Shoulder shrugs 2x10 3" Pendulum flex/ext and abd/add x20 each Manual Therapy: Passive left shoulder flexion, abduction and ER Modalities: Left shoulder Vaso Low pressure, 34 degrees x 15 minutes     PATIENT EDUCATION: Education details: Eval findings, POC, HEP, self care Person educated: Patient Education method: Explanation, Demonstration, Tactile cues, Verbal cues, and Handouts Education comprehension: verbalized understanding, returned demonstration, verbal cues required, and tactile cues required   HOME EXERCISE PROGRAM: Access Code:  [email or web address] URL: [email or web address] Date:  07/12/2022 Prepared by: Gar Ponto   Exercises - Flexion-Extension Shoulder Pendulum with Table Support  - 1 x daily - 7 x weekly - 1 sets - 20 reps - Horizontal Shoulder Pendulum with Table Support  - 1 x daily - 7 x weekly - 1 sets - 20 reps - Standing Shoulder and Trunk Flexion at Table  - 1 x daily - 7 x weekly - 1 sets - 10 reps - 5 hold - Seated Shoulder External Rotation PROM on Table  - 1 x daily - 7 x weekly - 1 sets - 10 reps - 5 hold   ASSESSMENT:   CLINICAL IMPRESSION: PT was completed for L shoulder/scapular PROM and AAROM and gentle scapular strengthening. PROM continues to be at an appropriate level. Vaso was then provided for symptom management. Pt tolerated PT today without adverse effects. Will initiate isometric strengthening of the L shoulder muscles next week.  OBJECTIVE IMPAIRMENTS: decreased activity tolerance, decreased ROM, decreased strength, impaired UE functional use, pain, and heavy arm .    ACTIVITY LIMITATIONS: carrying, lifting, sleeping, bed mobility, bathing, toileting, dressing, reach over head, and caring for others   PARTICIPATION LIMITATIONS: meal prep, cleaning, laundry, and driving   PERSONAL FACTORS: Age, Fitness, Past/current experiences, Social background, and 1 comorbidity: High BMI-heavy arm  are also affecting patient's functional outcome.    REHAB POTENTIAL: Good   CLINICAL DECISION MAKING: Evolving/moderate complexity   EVALUATION COMPLEXITY: Moderate     GOALS:   SHORT TERM GOALS: Target date: 07/27/22   Pt will be Ind in an initial HEP  Baseline: Goal status:07/26/22=MET   LONG TERM GOALS: Target date: 09/14/22   Pt will be Ind in a final HEP to maintain achieved LOF Baseline: initiated Goal status: INITIAL   2.  Improve L shoulder PROM to flexion 140, abd 120, ER 50  Baseline: see flow sheet Status: 08/01/22=see flow sheets Goal status: 07/29/22=Improving   3.  Pt's  FOTO score will improved to the predicted value of 58% as indication of improved function by DC anticpated to be after 09/14/22 Baseline: 32% Goal status: INITIAL   4.  Develop LTGs after 3//22/24 date when pt is able to particiapte in AROM and strengthening therex Baseline:  Goal status: INITIAL   PLAN:   PT FREQUENCY: 1-2x/week   PT DURATION: 8 weeks   PLANNED INTERVENTIONS: Therapeutic exercises, Therapeutic activity, Patient/Family education, Self Care, Joint mobilization, Aquatic Therapy, Dry Needling, Electrical stimulation, Cryotherapy, Moist heat, Taping, Vasopneumatic device, Ultrasound, Ionotophoresis 51m/ml Dexamethasone, Manual therapy, and Re-evaluation   PLAN FOR NEXT SESSION: Review FOTO; assess response to HEP; progress therex as indicated; use of modalities, manual therapy; and TPDN as indicated.   Rachel Nakatani MS, PT 08/03/22 1:41 PM

## 2022-08-03 ENCOUNTER — Ambulatory Visit: Payer: Medicare HMO

## 2022-08-03 DIAGNOSIS — M25612 Stiffness of left shoulder, not elsewhere classified: Secondary | ICD-10-CM | POA: Diagnosis not present

## 2022-08-03 DIAGNOSIS — M25512 Pain in left shoulder: Secondary | ICD-10-CM | POA: Diagnosis not present

## 2022-08-03 DIAGNOSIS — M6281 Muscle weakness (generalized): Secondary | ICD-10-CM

## 2022-08-03 DIAGNOSIS — R6889 Other general symptoms and signs: Secondary | ICD-10-CM | POA: Diagnosis not present

## 2022-08-08 ENCOUNTER — Ambulatory Visit: Payer: Medicare HMO

## 2022-08-08 DIAGNOSIS — M6281 Muscle weakness (generalized): Secondary | ICD-10-CM

## 2022-08-08 DIAGNOSIS — M25512 Pain in left shoulder: Secondary | ICD-10-CM | POA: Diagnosis not present

## 2022-08-08 DIAGNOSIS — M25612 Stiffness of left shoulder, not elsewhere classified: Secondary | ICD-10-CM | POA: Diagnosis not present

## 2022-08-08 DIAGNOSIS — R6889 Other general symptoms and signs: Secondary | ICD-10-CM | POA: Diagnosis not present

## 2022-08-08 DIAGNOSIS — F339 Major depressive disorder, recurrent, unspecified: Secondary | ICD-10-CM | POA: Diagnosis not present

## 2022-08-08 NOTE — Therapy (Signed)
OUTPATIENT PHYSICAL THERAPY TREATMENT NOTE   Patient Name: Rachel Woodard MRN: [id number] DOB:Aug 25, 1950, 72 y.o., female Today's Date: 08/09/2022  PCP: Scheryl Marten, [place]   REFERRING PROVIDER: Sydnee Cabal, MD   END OF SESSION:   PT End of Session - 08/09/22 0614     Visit Number 7    Number of Visits 17    Date for PT Re-Evaluation 09/14/22    Authorization Type [employer] MEDICARE HMO;  [place] Time Period 07/18/22-09/01/22    Authorization - Visit Number 6    Authorization - Number of Visits 12    Progress Note Due on Visit 10    PT Start Time [id number]    PT Stop Time 1240    PT Time Calculation (min) 45 min    Activity Tolerance Patient tolerated treatment well    Behavior During Therapy WFL for tasks assessed/performed                 Past Medical History:  Diagnosis Date   Anemia 2001   after gastric bypass   Anxiety    Aortic atherosclerosis (HCC)    Arthritis    Back pain    Bilateral swelling of feet    Bipolar 1 disorder (HCC)    Breast cancer ([place]) 2013   Breast cancer    Radiation therapy   Calcified granuloma of lung (HCC)    Cigarette nicotine dependence    Cigarette nicotine dependence in remission    Cocaine dependence in remission ([place])    COPD (chronic obstructive pulmonary disease) (HCC)    Depression    Disequilibrium    Fibromyalgia    GERD (gastroesophageal reflux disease)    HSV infection    Hypertension    Hypertensive kidney disease with stage 3a chronic kidney disease ([place])    Hypertensive kidney disease with stage 3b chronic kidney disease ([place])    Osteoarthritis    Personal history of infectious disease    Personal history of radiation therapy 2013   Pneumonia    Pre-diabetes    PTSD (post-traumatic stress disorder)    Seasonal allergic rhinitis due to pollen    Sleep apnea    uses CPAP   SOB (shortness of breath)    Vitamin D deficiency    Past Surgical History:  Procedure  Laterality Date   ABDOMINAL HYSTERECTOMY  2018   BREAST LUMPECTOMY Right 2013   GASTRIC BYPASS  2001   TOTAL KNEE ARTHROPLASTY Left 12/22/2018   Procedure: LEFT TOTAL KNEE ARTHROPLASTY;  Surgeon: Leandrew Koyanagi, MD;  Location: [facility];  Service: Orthopedics;  Laterality: Left;   Patient Active Problem List   Diagnosis Date Noted   Pain due to onychomycosis of toenails of both feet 01/20/2020   Shortness of breath 10/05/2019   Status post total left knee replacement 12/22/2018   MDD (major depressive disorder), recurrent episode, mild ([place]) 10/29/2018   Autoimmune hepatitis ([place]) 10/29/2018   OSA (obstructive sleep apnea) 10/29/2018   Renal cyst, right 10/22/2018   Lung nodule, solitary 10/22/2018   Genital herpes simplex 10/29/2017   Vitamin D deficiency 10/29/2017   Class 3 severe obesity with serious comorbidity and body mass index (BMI) of 40.0 to 44.9 in adult [facility]) 10/29/2017   Essential hypertension 10/29/2017   Prediabetes 02/14/2017   Fibromyalgia 02/14/2017   Substance abuse in remission ([place]) 02/14/2017   Anxiety and depression 02/14/2017   HX: breast cancer 02/14/2017   Urge incontinence 11/13/2016  Unilateral primary osteoarthritis, left knee 06/14/2016   GERD (gastroesophageal reflux disease) 11/02/2015   Angioedema 10/24/2013   Tobacco abuse 10/24/2013   DCIS (ductal carcinoma in situ) of breast 04/07/2013   Depression 04/07/2013    REFERRING DIAG: ICD-10:Z47.89: Encounter for other orthopedic aftercare. Left shoulder scope SAD/SCR/RCR  THERAPY DIAG:  Acute pain of left shoulder  Stiffness of left shoulder, not elsewhere classified  Muscle weakness (generalized)  Rationale for Evaluation and Treatment Rehabilitation  PERTINENT HISTORY: High BMI, Arthritis, anxiety, COPD  PRECAUTIONS: Shoulder  07/03/22 Surgery   SUBJECTIVE:                                                                                                                                                                                     SUBJECTIVE STATEMENT:  Pt reports having a little more L shoulder pain the past couple of days. Pt is not able toidnetify a particular reason for the increase.   PAIN:  Are you having pain? Yes: NPRS scale: 5/10 Pain location: L shoulder Pain description: ache, constant Aggravating factors: Dressing Relieving factors: Rest, hot shower, medication   OBJECTIVE: (objective measures completed at initial evaluation unless otherwise dated)   DIAGNOSTIC FINDINGS:  None recent for L shoulder in Epic   PATIENT SURVEYS:  FOTO: Perceived function   32%, predicted   58%    COGNITION: Overall cognitive status: Within functional limits for tasks assessed                                  SENSATION: WFL   POSTURE: Forward head c rounded shoulders   UPPER EXTREMITY ROM:    Passive ROM Right eval Left eval Left 07/19/22 07/25/22 LT LT 07/26/22 LT 08/01/22  Shoulder flexion A140 P60 P80 P105 P115 P125  Shoulder extension          Shoulder abduction A140 P60 P80 P80  100P  Shoulder adduction          Shoulder internal rotation          Shoulder external rotation A70 P40 P40 P50  P60  Elbow flexion          Elbow extension          Wrist flexion          Wrist extension          Wrist ulnar deviation          Wrist radial deviation          Wrist pronation          Wrist supination          (  Blank rows = not tested)   UPPER EXTREMITY MMT:            NT MMT Right eval Left eval  Shoulder flexion      Shoulder extension      Shoulder abduction      Shoulder adduction      Shoulder internal rotation      Shoulder external rotation      Middle trapezius      Lower trapezius      Elbow flexion      Elbow extension      Wrist flexion      Wrist extension      Wrist ulnar deviation      Wrist radial deviation      Wrist pronation      Wrist supination      Grip strength (lbs)      (Blank rows = not tested)   SHOULDER SPECIAL  TESTS: Impingement tests:  NT SLAP lesions:  NT Instability tests:  NT Rotator cuff assessment:  NT Biceps assessment:  NT   JOINT MOBILITY TESTING:  NT   PALPATION:  TTP of the L peri-shoulder  Next MD appt: 08/13/22             TODAY'S TREATMENT: [facility] Adult PT Treatment:                                                DATE: 08/08/22 Therapeutic Exercise: Seated table shoulder AA flexion 2x10  Seated table shoulder AA scaption 2x10  Supine shoulder AA ER c wand  2x10 Supine shoulder flex/AA c wand 2x10 Scapular retractions 2x10 3" Shoulder shrugs 2x10 3" Shoulder isometric for flex, ext, abd, ER. IR x5 5" 50% effort Modalities: Left shoulder Vaso Low pressure , 34 degrees x 15 minutes    [facility] Adult PT Treatment:                                                DATE: 08/03/22 Therapeutic Exercise: Pendulum flex/ext and abd/add x20 each Seated table shoulder AA flexion 2x10  Seated table shoulder AA scaption 2x10  Seated shoulder AA ER c wand  2x10 Scapular retractions 2x10 3" Shoulder shrugs 2x10 3" Manual Therapy: Passive left shoulder flexion, abduction and ER Modalities: Left shoulder Vaso Low pressure , 34 degrees x 15 minutes   [facility] Adult PT Treatment:                                                DATE: 08/01/22 Therapeutic Exercise: Pendulum flex/ext and abd/add x20 each Scapular retractions 2x10 3" Shoulder shrugs 2x10 3" L Shoulder flexion walk backs x10 5" L shoulder ER table top forward trunk flexion x10 5' PROM measures Manual Therapy: Passive left shoulder flexion, abduction and ER Modalities: Left shoulder Vaso Low pressure , 34 degrees x 15 minutes     PATIENT EDUCATION: Education details: Eval findings, POC, HEP, self care Person educated: Patient Education method: Explanation, Demonstration, Tactile cues, Verbal cues, and Handouts Education comprehension: verbalized understanding, returned demonstration, verbal cues required, and tactile cues  required   HOME EXERCISE PROGRAM: Access Code: [email or web address] URL: [email or web address] Date: 07/12/2022 Prepared by: Gar Ponto   Exercises - Flexion-Extension Shoulder Pendulum with Table Support  - 1 x daily - 7 x weekly - 1 sets - 20 reps - Horizontal Shoulder Pendulum with Table Support  - 1 x daily - 7 x weekly - 1 sets - 20 reps - Standing Shoulder and Trunk Flexion at Table  - 1 x daily - 7 x weekly - 1 sets - 10 reps - 5 hold - Seated Shoulder External Rotation PROM on Table  - 1 x daily - 7 x weekly - 1 sets - 10 reps - 5 hold   ASSESSMENT:   CLINICAL IMPRESSION: PT was contined for L shoulder/scapular AAROM and gentle scapular strengthening. Today L shoulder isometric strengthening exercises were initiated with pt tolerating without adverse effects. Pt returned proper demonstration and the isometric exercises were added to the pt's HEP. Vaso was then complete for symptom management.   OBJECTIVE IMPAIRMENTS: decreased activity tolerance, decreased ROM, decreased strength, impaired UE functional use, pain, and heavy arm .    ACTIVITY LIMITATIONS: carrying, lifting, sleeping, bed mobility, bathing, toileting, dressing, reach over head, and caring for others   PARTICIPATION LIMITATIONS: meal prep, cleaning, laundry, and driving   PERSONAL FACTORS: Age, Fitness, Past/current experiences, Social background, and 1 comorbidity: High BMI-heavy arm  are also affecting patient's functional outcome.    REHAB POTENTIAL: Good   CLINICAL DECISION MAKING: Evolving/moderate complexity   EVALUATION COMPLEXITY: Moderate     GOALS:   SHORT TERM GOALS: Target date: 07/27/22   Pt will be Ind in an initial HEP  Baseline: Goal status:07/26/22=MET   LONG TERM GOALS: Target date: 09/14/22   Pt will be Ind in a final HEP to maintain achieved LOF Baseline: initiated Goal status: INITIAL   2.  Improve L shoulder PROM to flexion 140, abd 120, ER 50  Baseline: see flow  sheet Status: 08/01/22=see flow sheets Goal status: 07/29/22=Improving   3.  Pt's FOTO score will improved to the predicted value of 58% as indication of improved function by DC anticpated to be after 09/14/22 Baseline: 32% Goal status: INITIAL   4.  Develop LTGs after 3//22/24 date when pt is able to particiapte in AROM and strengthening therex Baseline:  Goal status: INITIAL   PLAN:   PT FREQUENCY: 1-2x/week   PT DURATION: 8 weeks   PLANNED INTERVENTIONS: Therapeutic exercises, Therapeutic activity, Patient/Family education, Self Care, Joint mobilization, Aquatic Therapy, Dry Needling, Electrical stimulation, Cryotherapy, Moist heat, Taping, Vasopneumatic device, Ultrasound, Ionotophoresis 29m/ml Dexamethasone, Manual therapy, and Re-evaluation   PLAN FOR NEXT SESSION: Review FOTO; assess response to HEP; progress therex as indicated; use of modalities, manual therapy; and TPDN as indicated.   Damon Hargrove MS, PT 08/09/22 6:26 AM

## 2022-08-09 ENCOUNTER — Ambulatory Visit: Payer: Medicare HMO

## 2022-08-09 NOTE — Therapy (Signed)
OUTPATIENT PHYSICAL THERAPY TREATMENT NOTE   Patient Name: Rachel Woodard MRN: [id number] DOB:November 25, 1950, 72 y.o., female Today's Date: 08/10/2022  PCP: Scheryl Marten, [place]   REFERRING PROVIDER: Sydnee Cabal, MD   END OF SESSION:   PT End of Session - 08/10/22 1310     Visit Number 8    Number of Visits 17    Date for PT Re-Evaluation 09/14/22    Authorization Type [employer] MEDICARE HMO;  [place] Time Period 07/18/22-09/01/22    Authorization - Visit Number 7    Authorization - Number of Visits 12    Progress Note Due on Visit 10    PT Start Time [id number]    PT Stop Time 1400    PT Time Calculation (min) 47 min    Equipment Utilized During Treatment Other (comment)   Sling   Activity Tolerance Patient tolerated treatment well    Behavior During Therapy WFL for tasks assessed/performed                  Past Medical History:  Diagnosis Date   Anemia 2001   after gastric bypass   Anxiety    Aortic atherosclerosis (HCC)    Arthritis    Back pain    Bilateral swelling of feet    Bipolar 1 disorder (HCC)    Breast cancer ([place]) 2013   Breast cancer    Radiation therapy   Calcified granuloma of lung (HCC)    Cigarette nicotine dependence    Cigarette nicotine dependence in remission    Cocaine dependence in remission ([place])    COPD (chronic obstructive pulmonary disease) (HCC)    Depression    Disequilibrium    Fibromyalgia    GERD (gastroesophageal reflux disease)    HSV infection    Hypertension    Hypertensive kidney disease with stage 3a chronic kidney disease ([place])    Hypertensive kidney disease with stage 3b chronic kidney disease ([place])    Osteoarthritis    Personal history of infectious disease    Personal history of radiation therapy 2013   Pneumonia    Pre-diabetes    PTSD (post-traumatic stress disorder)    Seasonal allergic rhinitis due to pollen    Sleep apnea    uses CPAP   SOB (shortness of breath)     Vitamin D deficiency    Past Surgical History:  Procedure Laterality Date   ABDOMINAL HYSTERECTOMY  2018   BREAST LUMPECTOMY Right 2013   GASTRIC BYPASS  2001   TOTAL KNEE ARTHROPLASTY Left 12/22/2018   Procedure: LEFT TOTAL KNEE ARTHROPLASTY;  Surgeon: Leandrew Koyanagi, MD;  Location: [place] [facility];  Service: Orthopedics;  Laterality: Left;   Patient Active Problem List   Diagnosis Date Noted   Pain due to onychomycosis of toenails of both feet 01/20/2020   Shortness of breath 10/05/2019   Status post total left knee replacement 12/22/2018   MDD (major depressive disorder), recurrent episode, mild ([place]) 10/29/2018   Autoimmune hepatitis ([place]) 10/29/2018   OSA (obstructive sleep apnea) 10/29/2018   Renal cyst, right 10/22/2018   Lung nodule, solitary 10/22/2018   Genital herpes simplex 10/29/2017   Vitamin D deficiency 10/29/2017   Class 3 severe obesity with serious comorbidity and body mass index (BMI) of 40.0 to 44.9 in adult [facility]) 10/29/2017   Essential hypertension 10/29/2017   Prediabetes 02/14/2017   Fibromyalgia 02/14/2017   Substance abuse in remission ([place]) 02/14/2017   Anxiety and depression 02/14/2017  HX: breast cancer 02/14/2017   Urge incontinence 11/13/2016   Unilateral primary osteoarthritis, left knee 06/14/2016   GERD (gastroesophageal reflux disease) 11/02/2015   Angioedema 10/24/2013   Tobacco abuse 10/24/2013   DCIS (ductal carcinoma in situ) of breast 04/07/2013   Depression 04/07/2013    REFERRING DIAG: ICD-10:Z47.89: Encounter for other orthopedic aftercare. Left shoulder scope SAD/SCR/RCR  THERAPY DIAG:  Acute pain of left shoulder  Stiffness of left shoulder, not elsewhere classified  Muscle weakness (generalized)  Rationale for Evaluation and Treatment Rehabilitation  PERTINENT HISTORY: High BMI, Arthritis, anxiety, COPD  PRECAUTIONS: Shoulder  07/03/22 Surgery   SUBJECTIVE:                                                                                                                                                                                     SUBJECTIVE STATEMENT:  Pt reports the L upper shoulder and [place] area have continued to bother her.  PAIN:  Are you having pain? Yes: NPRS scale: 5/10 Pain location: L shoulder Pain description: ache, constant Aggravating factors: Dressing Relieving factors: Rest, hot shower, medication   OBJECTIVE: (objective measures completed at initial evaluation unless otherwise dated)   DIAGNOSTIC FINDINGS:  None recent for L shoulder in Epic   PATIENT SURVEYS:  FOTO: Perceived function   32%, predicted   58%    COGNITION: Overall cognitive status: Within functional limits for tasks assessed                                  SENSATION: WFL   POSTURE: Forward head c rounded shoulders   UPPER EXTREMITY ROM:    Passive ROM Right eval Left eval Left 07/19/22 07/25/22 LT LT 07/26/22 LT 08/01/22  Shoulder flexion A140 P60 P80 P105 P115 P125  Shoulder extension          Shoulder abduction A140 P60 P80 P80  100P  Shoulder adduction          Shoulder internal rotation          Shoulder external rotation A70 P40 P40 P50  P60  Elbow flexion          Elbow extension          Wrist flexion          Wrist extension          Wrist ulnar deviation          Wrist radial deviation          Wrist pronation          Wrist supination          (  Blank rows = not tested)   UPPER EXTREMITY MMT:            NT MMT Right eval Left eval  Shoulder flexion      Shoulder extension      Shoulder abduction      Shoulder adduction      Shoulder internal rotation      Shoulder external rotation      Middle trapezius      Lower trapezius      Elbow flexion      Elbow extension      Wrist flexion      Wrist extension      Wrist ulnar deviation      Wrist radial deviation      Wrist pronation      Wrist supination      Grip strength (lbs)      (Blank rows = not tested)   SHOULDER SPECIAL  TESTS: Impingement tests:  NT SLAP lesions:  NT Instability tests:  NT Rotator cuff assessment:  NT Biceps assessment:  NT   JOINT MOBILITY TESTING:  NT   PALPATION:  TTP of the L peri-shoulder  Next MD appt: 08/13/22             TODAY'S TREATMENT: [facility] Adult PT Treatment:                                                DATE: 08/10/22 Therapeutic Exercise: Seated table shoulder AA flexion x10  Seated table shoulder AA scaption x10  Supine shoulder AA ER c wand  x10 Supine shoulder flex/AA c wand x10 SL ER 2x10 Shoulder isometric for flex, ext, abd, ER. IR x5 5" 50% effort Manual Therapy: STM c MTPR to the L upper trap Modalities: Left shoulder Vaso Low pressure , 34 degrees x 15 minutes  Self Care: Use of theracane for L upper trap massage, pt returned demonstration  [facility] Adult PT Treatment:                                                DATE: 08/08/22 Therapeutic Exercise: Seated table shoulder AA flexion 2x10  Seated table shoulder AA scaption 2x10  Supine shoulder AA ER c wand  2x10 Supine shoulder flex/AA c wand 2x10 Scapular retractions 2x10 3" Shoulder shrugs 2x10 3" Shoulder isometric for flex, ext, abd, ER. IR x5 5" 50% effort Modalities: Left shoulder Vaso Low pressure , 34 degrees x 15 minutes    [facility] Adult PT Treatment:                                                DATE: 08/03/22 Therapeutic Exercise: Pendulum flex/ext and abd/add x20 each Seated table shoulder AA flexion 2x10  Seated table shoulder AA scaption 2x10  Seated shoulder AA ER c wand  2x10 Scapular retractions 2x10 3" Shoulder shrugs 2x10 3" Manual Therapy: Passive left shoulder flexion, abduction and ER Modalities: Left shoulder Vaso Low pressure , 34 degrees x 15 minutes   [facility] Adult PT Treatment:  DATE: 08/01/22 Therapeutic Exercise: Pendulum flex/ext and abd/add x20 each Scapular retractions 2x10 3" Shoulder shrugs 2x10 3" L Shoulder flexion  walk backs x10 5" L shoulder ER table top forward trunk flexion x10 5' PROM measures Manual Therapy: Passive left shoulder flexion, abduction and ER Modalities: Left shoulder Vaso Low pressure , 34 degrees x 15 minutes     PATIENT EDUCATION: Education details: Eval findings, POC, HEP, self care Person educated: Patient Education method: Explanation, Demonstration, Tactile cues, Verbal cues, and Handouts Education comprehension: verbalized understanding, returned demonstration, verbal cues required, and tactile cues required   HOME EXERCISE PROGRAM: Access Code: [email or web address] URL: [email or web address] Date: 08/10/2022 Prepared by: Gar Ponto  Exercises - Flexion-Extension Shoulder Pendulum with Table Support  - 1 x daily - 7 x weekly - 1 sets - 20 reps - Horizontal Shoulder Pendulum with Table Support  - 1 x daily - 7 x weekly - 1 sets - 20 reps - Standing Shoulder and Trunk Flexion at Table  - 1 x daily - 7 x weekly - 1 sets - 10 reps - 5 hold - Seated Shoulder External Rotation PROM on Table  - 1 x daily - 7 x weekly - 1 sets - 10 reps - 5 hold - Seated Shoulder Flexion Slide at Table Top with Forearm in Neutral  - 1 x daily - 7 x weekly - 2 sets - 10 reps - 3 hold - Seated Shoulder Abduction Towel Slide at Table Top  - 1 x daily - 7 x weekly - 2 sets - 10 reps - 3 hold - Supine Shoulder External Rotation with Dowel  - 1 x daily - 7 x weekly - 1 sets - 10 reps - 3 hold - Supine Shoulder Flexion Extension AAROM with Dowel  - 1 x daily - 7 x weekly - 1 sets - 10 reps - 3 hold - Isometric Shoulder Flexion at Wall  - 1 x daily - 7 x weekly - 1 sets - 10 reps - 5 hold - Isometric Shoulder Extension at Wall  - 1 x daily - 7 x weekly - 1 sets - 10 reps - 5 hold - Isometric Shoulder Abduction at Wall  - 1 x daily - 7 x weekly - 1 sets - 10 reps - 5 hold - Standing Isometric Shoulder External Rotation with Doorway  - 1 x daily - 7 x weekly - 1 sets - 10 reps - 5 hold - Standing  Isometric Shoulder Internal Rotation with Towel Roll at Doorway  - 1 x daily - 7 x weekly - 1 sets - 10 reps - 5 hold   ASSESSMENT:   CLINICAL IMPRESSION: STM c MTPR was provided to the L upper trap to address tight muscle bands and associated pain. Discussed use of a theracane as an option of the pt to obtain and use at home. Therex was then completed for L shoulder AAROM and gentle strengthening c isometrics. L shoulder pain seems in part due to increased upper trap muscle tightness. Vaso was then complete for symptom management. Pt tolerated PT today without adverse effects. Pt will continue to benefit from skilled PT to address impairments for improved function   OBJECTIVE IMPAIRMENTS: decreased activity tolerance, decreased ROM, decreased strength, impaired UE functional use, pain, and heavy arm .    ACTIVITY LIMITATIONS: carrying, lifting, sleeping, bed mobility, bathing, toileting, dressing, reach over head, and caring for others   PARTICIPATION LIMITATIONS: meal prep, cleaning, laundry, and driving   PERSONAL FACTORS: Age,  Fitness, Past/current experiences, Social background, and 1 comorbidity: High BMI-heavy arm  are also affecting patient's functional outcome.    REHAB POTENTIAL: Good   CLINICAL DECISION MAKING: Evolving/moderate complexity   EVALUATION COMPLEXITY: Moderate     GOALS:   SHORT TERM GOALS: Target date: 07/27/22   Pt will be Ind in an initial HEP  Baseline: Goal status:07/26/22=MET   LONG TERM GOALS: Target date: 09/14/22   Pt will be Ind in a final HEP to maintain achieved LOF Baseline: initiated Goal status: INITIAL   2.  Improve L shoulder PROM to flexion 140, abd 120, ER 50  Baseline: see flow sheet Status: 08/01/22=see flow sheets Goal status: 07/29/22=Improving   3.  Pt's FOTO score will improved to the predicted value of 58% as indication of improved function by DC anticpated to be after 09/14/22 Baseline: 32% Goal status: INITIAL   4.  Develop LTGs  after 3//22/24 date when pt is able to particiapte in AROM and strengthening therex Baseline:  Goal status: INITIAL   PLAN:   PT FREQUENCY: 1-2x/week   PT DURATION: 8 weeks   PLANNED INTERVENTIONS: Therapeutic exercises, Therapeutic activity, Patient/Family education, Self Care, Joint mobilization, Aquatic Therapy, Dry Needling, Electrical stimulation, Cryotherapy, Moist heat, Taping, Vasopneumatic device, Ultrasound, Ionotophoresis 5m/ml Dexamethasone, Manual therapy, and Re-evaluation   PLAN FOR NEXT SESSION: Review FOTO; assess response to HEP; progress therex as indicated; use of modalities, manual therapy; and TPDN as indicated.   Matalynn Graff MS, PT 08/10/22 4:31 PM

## 2022-08-10 ENCOUNTER — Ambulatory Visit: Payer: Medicare HMO

## 2022-08-10 DIAGNOSIS — R6889 Other general symptoms and signs: Secondary | ICD-10-CM | POA: Diagnosis not present

## 2022-08-10 DIAGNOSIS — M25612 Stiffness of left shoulder, not elsewhere classified: Secondary | ICD-10-CM

## 2022-08-10 DIAGNOSIS — M25512 Pain in left shoulder: Secondary | ICD-10-CM

## 2022-08-10 DIAGNOSIS — M6281 Muscle weakness (generalized): Secondary | ICD-10-CM

## 2022-08-13 DIAGNOSIS — R6889 Other general symptoms and signs: Secondary | ICD-10-CM | POA: Diagnosis not present

## 2022-08-14 ENCOUNTER — Ambulatory Visit (INDEPENDENT_AMBULATORY_CARE_PROVIDER_SITE_OTHER): Payer: Medicare HMO | Admitting: Nurse Practitioner

## 2022-08-14 VITALS — BP 156/79 | HR 70 | Temp 97.7°F | Ht 66.0 in | Wt 246.0 lb

## 2022-08-14 DIAGNOSIS — E559 Vitamin D deficiency, unspecified: Secondary | ICD-10-CM

## 2022-08-14 DIAGNOSIS — R6889 Other general symptoms and signs: Secondary | ICD-10-CM | POA: Diagnosis not present

## 2022-08-14 DIAGNOSIS — Z6839 Body mass index (BMI) 39.0-39.9, adult: Secondary | ICD-10-CM

## 2022-08-14 DIAGNOSIS — I10 Essential (primary) hypertension: Secondary | ICD-10-CM | POA: Diagnosis not present

## 2022-08-14 DIAGNOSIS — R0602 Shortness of breath: Secondary | ICD-10-CM

## 2022-08-14 DIAGNOSIS — R7303 Prediabetes: Secondary | ICD-10-CM

## 2022-08-14 MED ORDER — TIRZEPATIDE 2.5 MG/0.5ML ~~LOC~~ SOAJ: 2.5000 mg | SUBCUTANEOUS | 0 refills | Status: DC

## 2022-08-14 MED ORDER — VITAMIN D (ERGOCALCIFEROL) 1.25 MG (50000 UNIT) PO CAPS: 50000.0000 [IU] | ORAL_CAPSULE | ORAL | 0 refills | Status: DC

## 2022-08-14 NOTE — Patient Instructions (Signed)

## 2022-08-14 NOTE — Progress Notes (Signed)
Office: [phone]  /  Fax: [phone]  WEIGHT SUMMARY AND BIOMETRICS  Medical Weight Loss Height: 5' 6"$  (1.676 m) Weight: 246 lb (111.6 kg) Temp: 97.7 F (36.5 C) Pulse Rate: 70 BP: (!) 156/79 SpO2: 99 % Fasting: Yes Today's Visit #: 21 Weight at Last VIsit: 252lb Weight Lost Since Last Visit: 6lb  Body Fat %: 48.6 % Fat Mass (lbs): 120 lbs Muscle Mass (lbs): 120.4 lbs Total Body Water (lbs): 91.2 lbs Visceral Fat Rating : 16 RMR: 2002 Starting Date: 03/14/21 Starting Weight: 258lb Total Weight Loss (lbs): 12 lb (5.443 kg)    HPI  Chief Complaint: OBESITY  Rachel Woodard is here to discuss her progress with her obesity treatment plan. She is on the practicing portion control and making smarter food choices, such as increasing vegetables and decreasing simple carbohydrates and states she is following her eating plan approximately 75 % of the time. She states she is exercising 0 minutes 0 days per week.  Interval History:  Since last office visit she has lost 6 pounds. She is trying to eat more protein.  She hasn't been able to cook much. She is struggling with some hunger and cravings.  She is still struggling with shoulder pain after having surgery on 07/03/22.  She saw the surgeon yesterday and is going to PT twice per week.  She is drinking water daily.  She is seeing a therapist every 1-2 weeks.    Pharmacotherapy for weight loss: she is not currently taking medications for medical weight loss.   Previous pharmacotherapy for medical weight loss:   she has never tried medications in the past for medical weight loss.  She has tried OTC weight loss meds.   Bariatric surgery:  Patient is status post gastric bypass by in [place] in 2001.  Her highest weight prior to surgery was 286 lbs and Her nadir weight after surgery was 186lbs.  Taking Vit D, Calcium, Vit B12.  Denies taking a multivitamin or iron.   Hypertension BP is elevated today. She has not taken her BP meds  today.  She has been taking OTC meds for hip, shoulder and leg pain.  She is still struggling with shoulder pain since having surgery.    Medication(s): she is taking Coreg 3.125 BID and HCTZ 34m.  Denies side effects.  Denies chest pain, palpitations and SOB.  BP Readings from Last 3 Encounters:  08/14/22 (!) 156/79  07/17/22 110/69  06/13/22 119/74   Lab Results  Component Value Date   CREATININE 1.18 (H) 06/13/2022   CREATININE 1.15 (H) 03/14/2021   CREATININE 0.94 11/15/2020    Prediabetes SS[place] a diagnosis of prediabetes based on her elevated HgA1c. She is struggling with polyphagia and cravings.  Medication(s): took Metformin in the past and stopped due to side effects of diarrhea.    Lab Results  Component Value Date   HGBA1C 6.0 (H) 06/13/2022   Lab Results  Component Value Date   INSULIN 14.4 03/14/2021   Vit D deficiency  she is taking Vit D 50,000 IU weekly.  Denies side effects.  Denies nausea, vomiting or muscle weakness.    Lab Results  Component Value Date   VD25OH 36.9 06/13/2022   VD25OH 33.0 03/14/2021   VD25OH 27.7 (L) 04/24/2018   Shortness of breath with exertion: Last IC was 1757 on 03/14/21 Today IC was 2002 Improving  PHYSICAL EXAM:  Blood pressure (!) 156/79, pulse 70, temperature 97.7 F (36.5 C), height 5' 6"$  (1.676 m), weight  246 lb (111.6 kg), SpO2 99 %. Body mass index is 39.71 kg/m.  General: She is overweight, cooperative, alert, well developed, and in no acute distress. PSYCH: Has normal mood, affect and thought process.   Extremities: No edema.  Neurologic: No gross sensory or motor deficits. No tremors or fasciculations noted.    DIAGNOSTIC DATA REVIEWED:  BMET    Component Value Date/Time   NA 138 06/13/2022 1225   K 4.6 06/13/2022 1225   CL 99 06/13/2022 1225   CO2 25 06/13/2022 1225   GLUCOSE 100 (H) 06/13/2022 1225   GLUCOSE 115 (H) 12/23/2018 0521   BUN 27 06/13/2022 1225   CREATININE 1.18 (H) 06/13/2022  1225   CREATININE 0.95 08/08/2016 1102   CALCIUM 9.1 06/13/2022 1225   GFRNONAA 41 (L) 12/23/2018 0521   GFRAA 47 (L) 12/23/2018 0521   Lab Results  Component Value Date   HGBA1C 6.0 (H) 06/13/2022   HGBA1C 5.7 08/08/2016   Lab Results  Component Value Date   INSULIN 14.4 03/14/2021   Lab Results  Component Value Date   TSH 3.320 03/14/2021   CBC    Component Value Date/Time   WBC 7.6 03/14/2021 0844   WBC 8.7 12/23/2018 0521   RBC 4.63 03/14/2021 0844   RBC 4.34 12/23/2018 0521   HGB 13.3 03/14/2021 0844   HCT 40.5 03/14/2021 0844   PLT 255 03/14/2021 0844   MCV 88 03/14/2021 0844   MCH 28.7 03/14/2021 0844   MCH 27.9 12/23/2018 0521   MCHC 32.8 03/14/2021 0844   MCHC 31.9 12/23/2018 0521   RDW 14.5 03/14/2021 0844   Iron Studies    Component Value Date/Time   FERRITIN 17 06/13/2022 1225   Lipid Panel     Component Value Date/Time   CHOL 170 03/14/2021 0844   TRIG 99 03/14/2021 0844   HDL 53 03/14/2021 0844   CHOLHDL 3.1 07/11/2017 1107   CHOLHDL 3.0 08/08/2016 1102   VLDL 17 08/08/2016 1102   LDLCALC 99 03/14/2021 0844   Hepatic Function Panel     Component Value Date/Time   PROT 6.9 06/13/2022 1225   ALBUMIN 4.1 06/13/2022 1225   AST 13 06/13/2022 1225   ALT 17 06/13/2022 1225   ALKPHOS 114 06/13/2022 1225   BILITOT 0.2 06/13/2022 1225   BILIDIR 0.1 08/08/2016 1102   IBILI 0.3 08/08/2016 1102      Component Value Date/Time   TSH 3.320 03/14/2021 0844   Nutritional Lab Results  Component Value Date   VD25OH 36.9 06/13/2022   VD25OH 33.0 03/14/2021   VD25OH 27.7 (L) 04/24/2018     ASSESSMENT AND PLAN  TREATMENT PLAN FOR OBESITY:  Recommended Dietary Goals  [place] is currently in the action stage of change. As such, her goal is to continue weight management plan. She has agreed to keeping a food journal and adhering to recommended goals of 1500 calories and 90+ protein.  Behavioral Intervention  We discussed the following  Behavioral Modification Strategies today: increasing lean protein intake, decreasing simple carbohydrates , increasing vegetables, avoid skipping meals, increase water intake, work on meal planning and easy cooking plans, and think about ways to increase physical activity.  Additional resources provided today: NA  Recommended Physical Activity Goals  Parul has been advised to work up to 150 minutes of moderate intensity aerobic activity a week and strengthening exercises 2-3 times per week for cardiovascular health, weight loss maintenance and preservation of muscle mass.   She has agreed to continue physical  activity as is.    ASSOCIATED CONDITIONS ADDRESSED TODAY  Action/Plan  Essential hypertension Continue to follow up with PCP.  Continue meds as directed.    Vivan is working on healthy weight loss and exercise to improve blood pressure control. We will watch for signs of hypotension as she continues her lifestyle modifications.   Vitamin D deficiency -     Vitamin D (Ergocalciferol); Take 1 capsule (50,000 Units total) by mouth every 7 (seven) days.  Dispense: 4 capsule; Refill: 0  Prediabetes -     Start Tirzepatide; Inject 2.5 mg into the skin once a week.  Dispense: 2 mL; Refill: 0. To bring to next visit to show how to injection and use the pen.  Side effects discussed  Contraindications:  Pancreatitis (active gallstones) Medullary thyroid cancer High triglycerides (>500)-will need labs prior to starting Multiple Endocrine Neoplasia syndrome type 2 (MEN 2) Trying to get pregnant Breastfeeding Use with caution with taking insulin or sulfonylureas (will need to monitor blood sugars for hypoglycemia)  Stevette will continue to work on weight loss, exercise, and decreasing simple carbohydrates to help decrease the risk of diabetes.    SOB (shortness of breath) [R06.02] IC improving.  Continue working on Hospital doctor.  Exercise per surgeon  recommendations  Morbid obesity ([place]) -     Tirzepatide; Inject 2.5 mg into the skin once a week.  Dispense: 2 mL; Refill: 0  BMI 39.0-39.9,adult -     Tirzepatide; Inject 2.5 mg into the skin once a week.  Dispense: 2 mL; Refill: 0      Return in about 4 weeks (around 09/11/2022).Marland Kitchen She was informed of the importance of frequent follow up visits to maximize her success with intensive lifestyle modifications for her multiple health conditions.   ATTESTASTION STATEMENTS:  Reviewed by clinician on day of visit: allergies, medications, problem list, medical history, surgical history, family history, social history, and previous encounter notes.     Ailene Rud. Hermena Swint FNP-C

## 2022-08-14 NOTE — Therapy (Signed)
OUTPATIENT PHYSICAL THERAPY TREATMENT NOTE   Patient Name: Rachel Woodard MRN: [id number] DOB:[birth year], 72 y.o., female Today's Date: 08/15/2022  PCP: Scheryl Marten, [place]   REFERRING PROVIDER: Sydnee Cabal, MD   END OF SESSION:   PT End of Session - 08/15/22 1317     Visit Number 9    Number of Visits 17    Date for PT Re-Evaluation 09/14/22    Authorization Type [employer] MEDICARE HMO;  [place] Time Period 07/18/22-09/01/22    Authorization - Visit Number 8    Authorization - Number of Visits 12    Progress Note Due on Visit 10    PT Start Time [id number]    PT Stop Time 1230    PT Time Calculation (min) 45 min    Activity Tolerance Patient tolerated treatment well    Behavior During Therapy WFL for tasks assessed/performed                  Past Medical History:  Diagnosis Date   Anemia 2001   after gastric bypass   Anxiety    Aortic atherosclerosis (HCC)    Arthritis    Back pain    Bilateral swelling of feet    Bipolar 1 disorder (HCC)    Breast cancer ([place]) 2013   Breast cancer    Radiation therapy   Calcified granuloma of lung (HCC)    Cigarette nicotine dependence    Cigarette nicotine dependence in remission    Cocaine dependence in remission ([place])    COPD (chronic obstructive pulmonary disease) (HCC)    Depression    Disequilibrium    Fibromyalgia    GERD (gastroesophageal reflux disease)    HSV infection    Hypertension    Hypertensive kidney disease with stage 3a chronic kidney disease ([place])    Hypertensive kidney disease with stage 3b chronic kidney disease ([place])    Osteoarthritis    Personal history of infectious disease    Personal history of radiation therapy 2013   Pneumonia    Pre-diabetes    PTSD (post-traumatic stress disorder)    Seasonal allergic rhinitis due to pollen    Sleep apnea    uses CPAP   SOB (shortness of breath)    Vitamin D deficiency    Past Surgical History:  Procedure  Laterality Date   ABDOMINAL HYSTERECTOMY  2018   BREAST LUMPECTOMY Right 2013   GASTRIC BYPASS  2001   TOTAL KNEE ARTHROPLASTY Left 12/22/2018   Procedure: LEFT TOTAL KNEE ARTHROPLASTY;  Surgeon: Leandrew Koyanagi, MD;  Location: [facility];  Service: Orthopedics;  Laterality: Left;   Patient Active Problem List   Diagnosis Date Noted   Pain due to onychomycosis of toenails of both feet 01/20/2020   Shortness of breath 10/05/2019   Status post total left knee replacement 12/22/2018   MDD (major depressive disorder), recurrent episode, mild ([place]) 10/29/2018   Autoimmune hepatitis ([place]) 10/29/2018   OSA (obstructive sleep apnea) 10/29/2018   Renal cyst, right 10/22/2018   Lung nodule, solitary 10/22/2018   Genital herpes simplex 10/29/2017   Vitamin D deficiency 10/29/2017   Morbid obesity ([place]) 10/29/2017   Essential hypertension 10/29/2017   Prediabetes 02/14/2017   Fibromyalgia 02/14/2017   Substance abuse in remission ([place]) 02/14/2017   Anxiety and depression 02/14/2017   HX: breast cancer 02/14/2017   Urge incontinence 11/13/2016   Unilateral primary osteoarthritis, left knee 06/14/2016   GERD (gastroesophageal reflux disease) 11/02/2015  Angioedema 10/24/2013   Tobacco abuse 10/24/2013   DCIS (ductal carcinoma in situ) of breast 04/07/2013   Depression 04/07/2013    REFERRING DIAG: ICD-10:Z47.89: Encounter for other orthopedic aftercare. Left shoulder scope SAD/SCR/RCR  THERAPY DIAG:  Acute pain of left shoulder  Stiffness of left shoulder, not elsewhere classified  Muscle weakness (generalized)  Rationale for Evaluation and Treatment Rehabilitation  PERTINENT HISTORY: High BMI, Arthritis, anxiety, COPD  PRECAUTIONS: Shoulder  07/03/22 Surgery   SUBJECTIVE:                                                                                                                                                                                    SUBJECTIVE STATEMENT:  Pt reports  her L shoulder is feeling better. She reports the sling has been DCed.  PAIN:  Are you having pain? Yes: NPRS scale: 1/10 Pain location: L shoulder Pain description: ache, constant Aggravating factors: Dressing Relieving factors: Rest, hot shower, medication   OBJECTIVE: (objective measures completed at initial evaluation unless otherwise dated)   DIAGNOSTIC FINDINGS:  None recent for L shoulder in Epic   PATIENT SURVEYS:  FOTO: Perceived function   32%, predicted   58%    COGNITION: Overall cognitive status: Within functional limits for tasks assessed                                  SENSATION: WFL   POSTURE: Forward head c rounded shoulders   UPPER EXTREMITY ROM:    Passive ROM Right eval Left eval Left 07/19/22 07/25/22 LT LT 07/26/22 LT 08/01/22 LT 08/15/22  Shoulder flexion A140 P60 P80 P105 P115 P125 AA150  Shoulder extension           Shoulder abduction A140 P60 P80 P80  100P   Shoulder adduction           Shoulder internal rotation           Shoulder external rotation A70 P40 P40 P50  P60 AA80  Elbow flexion           Elbow extension           Wrist flexion           Wrist extension           Wrist ulnar deviation           Wrist radial deviation           Wrist pronation           Wrist supination           (Blank rows = not tested)  UPPER EXTREMITY MMT:            NT MMT Right eval Left eval  Shoulder flexion      Shoulder extension      Shoulder abduction      Shoulder adduction      Shoulder internal rotation      Shoulder external rotation      Middle trapezius      Lower trapezius      Elbow flexion      Elbow extension      Wrist flexion      Wrist extension      Wrist ulnar deviation      Wrist radial deviation      Wrist pronation      Wrist supination      Grip strength (lbs)      (Blank rows = not tested)   SHOULDER SPECIAL TESTS: Impingement tests:  NT SLAP lesions:  NT Instability tests:  NT Rotator cuff assessment:   NT Biceps assessment:  NT   JOINT MOBILITY TESTING:  NT   PALPATION:  TTP of the L peri-shoulder  Next MD appt: 08/13/22             TODAY'S TREATMENT: [facility] Adult PT Treatment:                                                DATE: 08/15/22 Therapeutic Exercise: [place] for shoulder flexion x10 AAROM for shoulder ER x10 Shoulder row 2x10 RTB Shoulder ext 2x10 RTB AAROM flexion with UE ranger 2x10 Shoulder isometric for flex, ext, abd, ER. IR x5 5" 50% effort Manual Therapy: STM/DTM to the L upper trap GH distraction mobs PROM for flexion and ER  [facility] Adult PT Treatment:                                                DATE: 08/10/22 Therapeutic Exercise: Seated table shoulder AA flexion x10  Seated table shoulder AA scaption x10  Supine shoulder AA ER c wand  x10 Supine shoulder flex/AA c wand x10 SL ER 2x10 Shoulder isometric for flex, ext, abd, ER. IR x5 5" 50% effort Manual Therapy: STM c MTPR to the L upper trap Modalities: Left shoulder Vaso Low pressure , 34 degrees x 15 minutes  Self Care: Use of theracane for L upper trap massage, pt returned demonstration  [facility] Adult PT Treatment:                                                DATE: 08/08/22 Therapeutic Exercise: Seated table shoulder AA flexion 2x10  Seated table shoulder AA scaption 2x10  Supine shoulder AA ER c wand  2x10 Supine shoulder flex/AA c wand 2x10 Scapular retractions 2x10 3" Shoulder shrugs 2x10 3" Shoulder isometric for flex, ext, abd, ER. IR x5 5" 50% effort Modalities: Left shoulder Vaso Low pressure , 34 degrees x 15 minutes      PATIENT EDUCATION: Education details: Eval findings, POC, HEP, self care Person educated: Patient Education method: Explanation, Demonstration, Tactile cues, Verbal cues, and Handouts Education comprehension:  verbalized understanding, returned demonstration, verbal cues required, and tactile cues required   HOME EXERCISE PROGRAM: Access Code: [email or web address] URL:  [email or web address] Date: 08/10/2022 Prepared by: Gar Ponto  Exercises - Flexion-Extension Shoulder Pendulum with Table Support  - 1 x daily - 7 x weekly - 1 sets - 20 reps - Horizontal Shoulder Pendulum with Table Support  - 1 x daily - 7 x weekly - 1 sets - 20 reps - Standing Shoulder and Trunk Flexion at Table  - 1 x daily - 7 x weekly - 1 sets - 10 reps - 5 hold - Seated Shoulder External Rotation PROM on Table  - 1 x daily - 7 x weekly - 1 sets - 10 reps - 5 hold - Seated Shoulder Flexion Slide at Table Top with Forearm in Neutral  - 1 x daily - 7 x weekly - 2 sets - 10 reps - 3 hold - Seated Shoulder Abduction Towel Slide at Table Top  - 1 x daily - 7 x weekly - 2 sets - 10 reps - 3 hold - Supine Shoulder External Rotation with Dowel  - 1 x daily - 7 x weekly - 1 sets - 10 reps - 3 hold - Supine Shoulder Flexion Extension AAROM with Dowel  - 1 x daily - 7 x weekly - 1 sets - 10 reps - 3 hold - Isometric Shoulder Flexion at Wall  - 1 x daily - 7 x weekly - 1 sets - 10 reps - 5 hold - Isometric Shoulder Extension at Wall  - 1 x daily - 7 x weekly - 1 sets - 10 reps - 5 hold - Isometric Shoulder Abduction at Wall  - 1 x daily - 7 x weekly - 1 sets - 10 reps - 5 hold - Standing Isometric Shoulder External Rotation with Doorway  - 1 x daily - 7 x weekly - 1 sets - 10 reps - 5 hold - Standing Isometric Shoulder Internal Rotation with Towel Roll at Doorway  - 1 x daily - 7 x weekly - 1 sets - 10 reps - 5 hold   ASSESSMENT:   CLINICAL IMPRESSION: Pt's ROM for the L shoulder continues to improve. Light strengthening was continued with [place] isometrics, scapular theraband, AA shoulder therex. Pt saw the PA for Dr. Harrietta Guardian on Monday and the sling has been Dced. Pt tolerated PT today without adverse effects. Pt will continue to benefit from skilled PT to address impairments for improved function. Reassess FOTO.   OBJECTIVE IMPAIRMENTS: decreased activity tolerance, decreased ROM,  decreased strength, impaired UE functional use, pain, and heavy arm .    ACTIVITY LIMITATIONS: carrying, lifting, sleeping, bed mobility, bathing, toileting, dressing, reach over head, and caring for others   PARTICIPATION LIMITATIONS: meal prep, cleaning, laundry, and driving   PERSONAL FACTORS: Age, Fitness, Past/current experiences, Social background, and 1 comorbidity: High BMI-heavy arm  are also affecting patient's functional outcome.    REHAB POTENTIAL: Good   CLINICAL DECISION MAKING: Evolving/moderate complexity   EVALUATION COMPLEXITY: Moderate     GOALS:   SHORT TERM GOALS: Target date: 07/27/22   Pt will be Ind in an initial HEP  Baseline: Goal status:07/26/22=MET   LONG TERM GOALS: Target date: 09/14/22   Pt will be Ind in a final HEP to maintain achieved LOF Baseline: initiated Goal status: INITIAL   2.  Improve L shoulder PROM to flexion 140, abd 120, ER 50  Baseline: see flow sheet Status: 08/01/22=see flow sheets Goal status: 07/29/22=Improving  3.  Pt's FOTO score will improved to the predicted value of 58% as indication of improved function by DC anticpated to be after 09/14/22 Baseline: 32% Goal status: INITIAL   4.  Develop LTGs after 3//22/24 date when pt is able to particiapte in AROM and strengthening therex Baseline:  Goal status: INITIAL   PLAN:   PT FREQUENCY: 1-2x/week   PT DURATION: 8 weeks   PLANNED INTERVENTIONS: Therapeutic exercises, Therapeutic activity, Patient/Family education, Self Care, Joint mobilization, Aquatic Therapy, Dry Needling, Electrical stimulation, Cryotherapy, Moist heat, Taping, Vasopneumatic device, Ultrasound, Ionotophoresis 80m/ml Dexamethasone, Manual therapy, and Re-evaluation   PLAN FOR NEXT SESSION: Review FOTO; assess response to HEP; progress therex as indicated; use of modalities, manual therapy; and TPDN as indicated.   Tyjae Shvartsman MS, PT 08/15/22 1:29 PM

## 2022-08-15 ENCOUNTER — Telehealth: Payer: Self-pay

## 2022-08-15 ENCOUNTER — Ambulatory Visit: Payer: Medicare HMO

## 2022-08-15 DIAGNOSIS — M25612 Stiffness of left shoulder, not elsewhere classified: Secondary | ICD-10-CM

## 2022-08-15 DIAGNOSIS — R6889 Other general symptoms and signs: Secondary | ICD-10-CM | POA: Diagnosis not present

## 2022-08-15 DIAGNOSIS — M6281 Muscle weakness (generalized): Secondary | ICD-10-CM | POA: Diagnosis not present

## 2022-08-15 DIAGNOSIS — M25512 Pain in left shoulder: Secondary | ICD-10-CM

## 2022-08-15 NOTE — Telephone Encounter (Signed)
Received fax from [facility]. Darcel Bayley has been denied through insurance. Pre diabetes is not an indicated diagnosis for this medication.

## 2022-08-16 DIAGNOSIS — R6889 Other general symptoms and signs: Secondary | ICD-10-CM | POA: Diagnosis not present

## 2022-08-16 DIAGNOSIS — F4312 Post-traumatic stress disorder, chronic: Secondary | ICD-10-CM | POA: Diagnosis not present

## 2022-08-16 DIAGNOSIS — F411 Generalized anxiety disorder: Secondary | ICD-10-CM | POA: Diagnosis not present

## 2022-08-16 DIAGNOSIS — F17210 Nicotine dependence, cigarettes, uncomplicated: Secondary | ICD-10-CM | POA: Diagnosis not present

## 2022-08-16 DIAGNOSIS — F3130 Bipolar disorder, current episode depressed, mild or moderate severity, unspecified: Secondary | ICD-10-CM | POA: Diagnosis not present

## 2022-08-16 NOTE — Therapy (Incomplete)
OUTPATIENT PHYSICAL THERAPY TREATMENT NOTE   Patient Name: Rachel Woodard MRN: [id number] DOB:05-Jan-1951, 72 y.o., female Today's Date: 08/15/2022  PCP: Scheryl Marten, [place]   REFERRING PROVIDER: Sydnee Cabal, MD   END OF SESSION:   PT End of Session - 08/15/22 1317     Visit Number 9    Number of Visits 17    Date for PT Re-Evaluation 09/14/22    Authorization Type [employer] MEDICARE HMO;  [place] Time Period 07/18/22-09/01/22    Authorization - Visit Number 8    Authorization - Number of Visits 12    Progress Note Due on Visit 10    PT Start Time [id number]    PT Stop Time 1230    PT Time Calculation (min) 45 min    Activity Tolerance Patient tolerated treatment well    Behavior During Therapy WFL for tasks assessed/performed                  Past Medical History:  Diagnosis Date   Anemia 2001   after gastric bypass   Anxiety    Aortic atherosclerosis (HCC)    Arthritis    Back pain    Bilateral swelling of feet    Bipolar 1 disorder (HCC)    Breast cancer ([place]) 2013   Breast cancer    Radiation therapy   Calcified granuloma of lung (HCC)    Cigarette nicotine dependence    Cigarette nicotine dependence in remission    Cocaine dependence in remission ([place])    COPD (chronic obstructive pulmonary disease) (HCC)    Depression    Disequilibrium    Fibromyalgia    GERD (gastroesophageal reflux disease)    HSV infection    Hypertension    Hypertensive kidney disease with stage 3a chronic kidney disease ([place])    Hypertensive kidney disease with stage 3b chronic kidney disease ([place])    Osteoarthritis    Personal history of infectious disease    Personal history of radiation therapy 2013   Pneumonia    Pre-diabetes    PTSD (post-traumatic stress disorder)    Seasonal allergic rhinitis due to pollen    Sleep apnea    uses CPAP   SOB (shortness of breath)    Vitamin D deficiency    Past Surgical History:  Procedure  Laterality Date   ABDOMINAL HYSTERECTOMY  2018   BREAST LUMPECTOMY Right 2013   GASTRIC BYPASS  2001   TOTAL KNEE ARTHROPLASTY Left 12/22/2018   Procedure: LEFT TOTAL KNEE ARTHROPLASTY;  Surgeon: Leandrew Koyanagi, MD;  Location: [facility];  Service: Orthopedics;  Laterality: Left;   Patient Active Problem List   Diagnosis Date Noted   Pain due to onychomycosis of toenails of both feet 01/20/2020   Shortness of breath 10/05/2019   Status post total left knee replacement 12/22/2018   MDD (major depressive disorder), recurrent episode, mild ([place]) 10/29/2018   Autoimmune hepatitis ([place]) 10/29/2018   OSA (obstructive sleep apnea) 10/29/2018   Renal cyst, right 10/22/2018   Lung nodule, solitary 10/22/2018   Genital herpes simplex 10/29/2017   Vitamin D deficiency 10/29/2017   Morbid obesity ([place]) 10/29/2017   Essential hypertension 10/29/2017   Prediabetes 02/14/2017   Fibromyalgia 02/14/2017   Substance abuse in remission ([place]) 02/14/2017   Anxiety and depression 02/14/2017   HX: breast cancer 02/14/2017   Urge incontinence 11/13/2016   Unilateral primary osteoarthritis, left knee 06/14/2016   GERD (gastroesophageal reflux disease) 11/02/2015  Angioedema 10/24/2013   Tobacco abuse 10/24/2013   DCIS (ductal carcinoma in situ) of breast 04/07/2013   Depression 04/07/2013    REFERRING DIAG: ICD-10:Z47.89: Encounter for other orthopedic aftercare. Left shoulder scope SAD/SCR/RCR  THERAPY DIAG:  Acute pain of left shoulder  Stiffness of left shoulder, not elsewhere classified  Muscle weakness (generalized)  Rationale for Evaluation and Treatment Rehabilitation  PERTINENT HISTORY: High BMI, Arthritis, anxiety, COPD  PRECAUTIONS: Shoulder  07/03/22 Surgery   SUBJECTIVE:                                                                                                                                                                                    SUBJECTIVE STATEMENT:  Pt reports  her L shoulder is feeling better. She reports the sling has been DCed.  PAIN:  Are you having pain? Yes: NPRS scale: 1/10 Pain location: L shoulder Pain description: ache, constant Aggravating factors: Dressing Relieving factors: Rest, hot shower, medication   OBJECTIVE: (objective measures completed at initial evaluation unless otherwise dated)   DIAGNOSTIC FINDINGS:  None recent for L shoulder in Epic   PATIENT SURVEYS:  FOTO: Perceived function   32%, predicted   58%    COGNITION: Overall cognitive status: Within functional limits for tasks assessed                                  SENSATION: WFL   POSTURE: Forward head c rounded shoulders   UPPER EXTREMITY ROM:    Passive ROM Right eval Left eval Left 07/19/22 07/25/22 LT LT 07/26/22 LT 08/01/22 LT 08/15/22  Shoulder flexion A140 P60 P80 P105 P115 P125 AA150  Shoulder extension           Shoulder abduction A140 P60 P80 P80  100P   Shoulder adduction           Shoulder internal rotation           Shoulder external rotation A70 P40 P40 P50  P60 AA80  Elbow flexion           Elbow extension           Wrist flexion           Wrist extension           Wrist ulnar deviation           Wrist radial deviation           Wrist pronation           Wrist supination           (Blank rows = not tested)  UPPER EXTREMITY MMT:            NT MMT Right eval Left eval  Shoulder flexion      Shoulder extension      Shoulder abduction      Shoulder adduction      Shoulder internal rotation      Shoulder external rotation      Middle trapezius      Lower trapezius      Elbow flexion      Elbow extension      Wrist flexion      Wrist extension      Wrist ulnar deviation      Wrist radial deviation      Wrist pronation      Wrist supination      Grip strength (lbs)      (Blank rows = not tested)   SHOULDER SPECIAL TESTS: Impingement tests:  NT SLAP lesions:  NT Instability tests:  NT Rotator cuff assessment:   NT Biceps assessment:  NT   JOINT MOBILITY TESTING:  NT   PALPATION:  TTP of the L peri-shoulder  Next MD appt: 08/13/22             TODAY'S TREATMENT: [facility] Adult PT Treatment:                                                DATE: 08/17/22 Therapeutic Exercise: *** Manual Therapy: *** Neuromuscular re-ed: *** Therapeutic Activity: *** Modalities: *** Self Care: [facility] Adult PT Treatment:                                                DATE: 08/15/22 Therapeutic Exercise: AAROM for shoulder flexion x10 AAROM for shoulder ER x10 Shoulder row 2x10 RTB Shoulder ext 2x10 RTB AAROM flexion with UE ranger 2x10 Shoulder isometric for flex, ext, abd, ER. IR x5 5" 50% effort Manual Therapy: STM/DTM to the L upper trap GH distraction mobs PROM for flexion and ER  [facility] Adult PT Treatment:                                                DATE: 08/10/22 Therapeutic Exercise: Seated table shoulder AA flexion x10  Seated table shoulder AA scaption x10  Supine shoulder AA ER c wand  x10 Supine shoulder flex/AA c wand x10 SL ER 2x10 Shoulder isometric for flex, ext, abd, ER. IR x5 5" 50% effort Manual Therapy: STM c MTPR to the L upper trap Modalities: Left shoulder Vaso Low pressure , 34 degrees x 15 minutes  Self Care: Use of theracane for L upper trap massage, pt returned demonstration  [facility] Adult PT Treatment:                                                DATE: 08/08/22 Therapeutic Exercise: Seated table shoulder AA flexion 2x10  Seated table shoulder AA scaption 2x10  Supine shoulder AA ER c  wand  2x10 Supine shoulder flex/AA c wand 2x10 Scapular retractions 2x10 3" Shoulder shrugs 2x10 3" Shoulder isometric for flex, ext, abd, ER. IR x5 5" 50% effort Modalities: Left shoulder Vaso Low pressure , 34 degrees x 15 minutes      PATIENT EDUCATION: Education details: Eval findings, POC, HEP, self care Person educated: Patient Education method: Explanation, Demonstration,  Tactile cues, Verbal cues, and Handouts Education comprehension: verbalized understanding, returned demonstration, verbal cues required, and tactile cues required   HOME EXERCISE PROGRAM: Access Code: [email or web address] URL: [email or web address] Date: 08/10/2022 Prepared by: Gar Ponto  Exercises - Flexion-Extension Shoulder Pendulum with Table Support  - 1 x daily - 7 x weekly - 1 sets - 20 reps - Horizontal Shoulder Pendulum with Table Support  - 1 x daily - 7 x weekly - 1 sets - 20 reps - Standing Shoulder and Trunk Flexion at Table  - 1 x daily - 7 x weekly - 1 sets - 10 reps - 5 hold - Seated Shoulder External Rotation PROM on Table  - 1 x daily - 7 x weekly - 1 sets - 10 reps - 5 hold - Seated Shoulder Flexion Slide at Table Top with Forearm in Neutral  - 1 x daily - 7 x weekly - 2 sets - 10 reps - 3 hold - Seated Shoulder Abduction Towel Slide at Table Top  - 1 x daily - 7 x weekly - 2 sets - 10 reps - 3 hold - Supine Shoulder External Rotation with Dowel  - 1 x daily - 7 x weekly - 1 sets - 10 reps - 3 hold - Supine Shoulder Flexion Extension AAROM with Dowel  - 1 x daily - 7 x weekly - 1 sets - 10 reps - 3 hold - Isometric Shoulder Flexion at Wall  - 1 x daily - 7 x weekly - 1 sets - 10 reps - 5 hold - Isometric Shoulder Extension at Wall  - 1 x daily - 7 x weekly - 1 sets - 10 reps - 5 hold - Isometric Shoulder Abduction at Wall  - 1 x daily - 7 x weekly - 1 sets - 10 reps - 5 hold - Standing Isometric Shoulder External Rotation with Doorway  - 1 x daily - 7 x weekly - 1 sets - 10 reps - 5 hold - Standing Isometric Shoulder Internal Rotation with Towel Roll at Doorway  - 1 x daily - 7 x weekly - 1 sets - 10 reps - 5 hold   ASSESSMENT:   CLINICAL IMPRESSION: Pt's ROM for the L shoulder continues to improve. Light strengthening was continued with [place] isometrics, scapular theraband, AA shoulder therex. Pt saw the PA for Dr. Harrietta Guardian on Monday and the sling has been Dced. Pt  tolerated PT today without adverse effects. Pt will continue to benefit from skilled PT to address impairments for improved function. Reassess FOTO.   OBJECTIVE IMPAIRMENTS: decreased activity tolerance, decreased ROM, decreased strength, impaired UE functional use, pain, and heavy arm .    ACTIVITY LIMITATIONS: carrying, lifting, sleeping, bed mobility, bathing, toileting, dressing, reach over head, and caring for others   PARTICIPATION LIMITATIONS: meal prep, cleaning, laundry, and driving   PERSONAL FACTORS: Age, Fitness, Past/current experiences, Social background, and 1 comorbidity: High BMI-heavy arm  are also affecting patient's functional outcome.    REHAB POTENTIAL: Good   CLINICAL DECISION MAKING: Evolving/moderate complexity   EVALUATION COMPLEXITY: Moderate     GOALS:   SHORT TERM GOALS:  Target date: 07/27/22   Pt will be Ind in an initial HEP  Baseline: Goal status:07/26/22=MET   LONG TERM GOALS: Target date: 09/14/22   Pt will be Ind in a final HEP to maintain achieved LOF Baseline: initiated Goal status: INITIAL   2.  Improve L shoulder PROM to flexion 140, abd 120, ER 50  Baseline: see flow sheet Status: 08/01/22=see flow sheets Goal status: 07/29/22=Improving   3.  Pt's FOTO score will improved to the predicted value of 58% as indication of improved function by DC anticpated to be after 09/14/22 Baseline: 32% Goal status: INITIAL   4.  Develop LTGs after 3//22/24 date when pt is able to particiapte in AROM and strengthening therex Baseline:  Goal status: INITIAL   PLAN:   PT FREQUENCY: 1-2x/week   PT DURATION: 8 weeks   PLANNED INTERVENTIONS: Therapeutic exercises, Therapeutic activity, Patient/Family education, Self Care, Joint mobilization, Aquatic Therapy, Dry Needling, Electrical stimulation, Cryotherapy, Moist heat, Taping, Vasopneumatic device, Ultrasound, Ionotophoresis 26m/ml Dexamethasone, Manual therapy, and Re-evaluation   PLAN FOR NEXT SESSION:  Review FOTO; assess response to HEP; progress therex as indicated; use of modalities, manual therapy; and TPDN as indicated.   Adaeze Better MS, PT 08/15/22 1:29 PM

## 2022-08-17 ENCOUNTER — Ambulatory Visit: Payer: Medicare HMO

## 2022-08-21 NOTE — Therapy (Incomplete)
OUTPATIENT PHYSICAL THERAPY TREATMENT NOTE   Patient Name: Rachel Woodard MRN: [id number] DOB:August 11, 1950, 72 y.o., female Today's Date: 08/21/2022  PCP: Scheryl Marten, [place]   REFERRING PROVIDER: Sydnee Cabal, MD   END OF SESSION:          Past Medical History:  Diagnosis Date   Anemia 2001   after gastric bypass   Anxiety    Aortic atherosclerosis (HCC)    Arthritis    Back pain    Bilateral swelling of feet    Bipolar 1 disorder ([place])    Breast cancer ([place]) 2013   Breast cancer    Radiation therapy   Calcified granuloma of lung (HCC)    Cigarette nicotine dependence    Cigarette nicotine dependence in remission    Cocaine dependence in remission ([place])    COPD (chronic obstructive pulmonary disease) (HCC)    Depression    Disequilibrium    Fibromyalgia    GERD (gastroesophageal reflux disease)    HSV infection    Hypertension    Hypertensive kidney disease with stage 3a chronic kidney disease ([place])    Hypertensive kidney disease with stage 3b chronic kidney disease ([place])    Osteoarthritis    Personal history of infectious disease    Personal history of radiation therapy 2013   Pneumonia    Pre-diabetes    PTSD (post-traumatic stress disorder)    Seasonal allergic rhinitis due to pollen    Sleep apnea    uses CPAP   SOB (shortness of breath)    Vitamin D deficiency    Past Surgical History:  Procedure Laterality Date   ABDOMINAL HYSTERECTOMY  2018   BREAST LUMPECTOMY Right 2013   GASTRIC BYPASS  2001   TOTAL KNEE ARTHROPLASTY Left 12/22/2018   Procedure: LEFT TOTAL KNEE ARTHROPLASTY;  Surgeon: Leandrew Koyanagi, MD;  Location: [facility];  Service: Orthopedics;  Laterality: Left;   Patient Active Problem List   Diagnosis Date Noted   Pain due to onychomycosis of toenails of both feet 01/20/2020   Shortness of breath 10/05/2019   Status post total left knee replacement 12/22/2018   MDD (major depressive disorder), recurrent episode, mild ([place])  10/29/2018   Autoimmune hepatitis ([place]) 10/29/2018   OSA (obstructive sleep apnea) 10/29/2018   Renal cyst, right 10/22/2018   Lung nodule, solitary 10/22/2018   Genital herpes simplex 10/29/2017   Vitamin D deficiency 10/29/2017   Morbid obesity ([place]) 10/29/2017   Essential hypertension 10/29/2017   Prediabetes 02/14/2017   Fibromyalgia 02/14/2017   Substance abuse in remission ([place]) 02/14/2017   Anxiety and depression 02/14/2017   HX: breast cancer 02/14/2017   Urge incontinence 11/13/2016   Unilateral primary osteoarthritis, left knee 06/14/2016   GERD (gastroesophageal reflux disease) 11/02/2015   Angioedema 10/24/2013   Tobacco abuse 10/24/2013   DCIS (ductal carcinoma in situ) of breast 04/07/2013   Depression 04/07/2013    REFERRING DIAG: ICD-10:Z47.89: Encounter for other orthopedic aftercare. Left shoulder scope SAD/SCR/RCR  THERAPY DIAG:  No diagnosis found.  Rationale for Evaluation and Treatment Rehabilitation  PERTINENT HISTORY: High BMI, Arthritis, anxiety, COPD  PRECAUTIONS: Shoulder  07/03/22 Surgery   SUBJECTIVE:  SUBJECTIVE STATEMENT:  Pt reports her L shoulder is feeling better. She reports the sling has been DCed.  PAIN:  Are you having pain? Yes: NPRS scale: 1/10 Pain location: L shoulder Pain description: ache, constant Aggravating factors: Dressing Relieving factors: Rest, hot shower, medication   OBJECTIVE: (objective measures completed at initial evaluation unless otherwise dated)   DIAGNOSTIC FINDINGS:  None recent for L shoulder in Epic   PATIENT SURVEYS:  FOTO: Perceived function   32%, predicted   58%    COGNITION: Overall cognitive status: Within functional limits for tasks assessed                                  SENSATION: WFL   POSTURE: Forward  head c rounded shoulders   UPPER EXTREMITY ROM:    Passive ROM Right eval Left eval Left 07/19/22 07/25/22 LT LT 07/26/22 LT 08/01/22 LT 08/15/22  Shoulder flexion A140 P60 P80 P105 P115 P125 AA150  Shoulder extension           Shoulder abduction A140 P60 P80 P80  100P   Shoulder adduction           Shoulder internal rotation           Shoulder external rotation A70 P40 P40 P50  P60 AA80  Elbow flexion           Elbow extension           Wrist flexion           Wrist extension           Wrist ulnar deviation           Wrist radial deviation           Wrist pronation           Wrist supination           (Blank rows = not tested)   UPPER EXTREMITY MMT:            NT MMT Right eval Left eval  Shoulder flexion      Shoulder extension      Shoulder abduction      Shoulder adduction      Shoulder internal rotation      Shoulder external rotation      Middle trapezius      Lower trapezius      Elbow flexion      Elbow extension      Wrist flexion      Wrist extension      Wrist ulnar deviation      Wrist radial deviation      Wrist pronation      Wrist supination      Grip strength (lbs)      (Blank rows = not tested)   SHOULDER SPECIAL TESTS: Impingement tests:  NT SLAP lesions:  NT Instability tests:  NT Rotator cuff assessment:  NT Biceps assessment:  NT   JOINT MOBILITY TESTING:  NT   PALPATION:  TTP of the L peri-shoulder  Next MD appt: 08/13/22             TODAY'S TREATMENT: [facility] Adult PT Treatment:  DATE: 08/21/22 Therapeutic Exercise: *** Manual Therapy: *** Neuromuscular re-ed: *** Therapeutic Activity: *** Modalities: *** Self Care: ***  [facility] Adult PT Treatment:                                                DATE: 08/15/22 Therapeutic Exercise: AAROM for shoulder flexion x10 AAROM for shoulder ER x10 Shoulder row 2x10 RTB Shoulder ext 2x10 RTB AAROM flexion with UE ranger 2x10 Shoulder  isometric for flex, ext, abd, ER. IR x5 5" 50% effort Manual Therapy: STM/DTM to the L upper trap GH distraction mobs PROM for flexion and ER  [facility] Adult PT Treatment:                                                DATE: 08/10/22 Therapeutic Exercise: Seated table shoulder AA flexion x10  Seated table shoulder AA scaption x10  Supine shoulder AA ER c wand  x10 Supine shoulder flex/AA c wand x10 SL ER 2x10 Shoulder isometric for flex, ext, abd, ER. IR x5 5" 50% effort Manual Therapy: STM c MTPR to the L upper trap Modalities: Left shoulder Vaso Low pressure , 34 degrees x 15 minutes  Self Care: Use of theracane for L upper trap massage, pt returned demonstration  [facility] Adult PT Treatment:                                                DATE: 08/08/22 Therapeutic Exercise: Seated table shoulder AA flexion 2x10  Seated table shoulder AA scaption 2x10  Supine shoulder AA ER c wand  2x10 Supine shoulder flex/AA c wand 2x10 Scapular retractions 2x10 3" Shoulder shrugs 2x10 3" Shoulder isometric for flex, ext, abd, ER. IR x5 5" 50% effort Modalities: Left shoulder Vaso Low pressure , 34 degrees x 15 minutes      PATIENT EDUCATION: Education details: Eval findings, POC, HEP, self care Person educated: Patient Education method: Explanation, Demonstration, Tactile cues, Verbal cues, and Handouts Education comprehension: verbalized understanding, returned demonstration, verbal cues required, and tactile cues required   HOME EXERCISE PROGRAM: Access Code: [email or web address] URL: [email or web address] Date: 08/10/2022 Prepared by: Gar Ponto  Exercises - Flexion-Extension Shoulder Pendulum with Table Support  - 1 x daily - 7 x weekly - 1 sets - 20 reps - Horizontal Shoulder Pendulum with Table Support  - 1 x daily - 7 x weekly - 1 sets - 20 reps - Standing Shoulder and Trunk Flexion at Table  - 1 x daily - 7 x weekly - 1 sets - 10 reps - 5 hold - Seated Shoulder External Rotation  PROM on Table  - 1 x daily - 7 x weekly - 1 sets - 10 reps - 5 hold - Seated Shoulder Flexion Slide at Table Top with Forearm in Neutral  - 1 x daily - 7 x weekly - 2 sets - 10 reps - 3 hold - Seated Shoulder Abduction Towel Slide at Table Top  - 1 x daily - 7 x weekly - 2 sets - 10 reps - 3 hold - Supine Shoulder External Rotation with Dowel  -  1 x daily - 7 x weekly - 1 sets - 10 reps - 3 hold - Supine Shoulder Flexion Extension AAROM with Dowel  - 1 x daily - 7 x weekly - 1 sets - 10 reps - 3 hold - Isometric Shoulder Flexion at Wall  - 1 x daily - 7 x weekly - 1 sets - 10 reps - 5 hold - Isometric Shoulder Extension at Wall  - 1 x daily - 7 x weekly - 1 sets - 10 reps - 5 hold - Isometric Shoulder Abduction at Wall  - 1 x daily - 7 x weekly - 1 sets - 10 reps - 5 hold - Standing Isometric Shoulder External Rotation with Doorway  - 1 x daily - 7 x weekly - 1 sets - 10 reps - 5 hold - Standing Isometric Shoulder Internal Rotation with Towel Roll at Doorway  - 1 x daily - 7 x weekly - 1 sets - 10 reps - 5 hold   ASSESSMENT:   CLINICAL IMPRESSION: Pt's ROM for the L shoulder continues to improve. Light strengthening was continued with [place] isometrics, scapular theraband, AA shoulder therex. Pt saw the PA for Dr. Harrietta Guardian on Monday and the sling has been Dced. Pt tolerated PT today without adverse effects. Pt will continue to benefit from skilled PT to address impairments for improved function. Reassess FOTO.   OBJECTIVE IMPAIRMENTS: decreased activity tolerance, decreased ROM, decreased strength, impaired UE functional use, pain, and heavy arm .    ACTIVITY LIMITATIONS: carrying, lifting, sleeping, bed mobility, bathing, toileting, dressing, reach over head, and caring for others   PARTICIPATION LIMITATIONS: meal prep, cleaning, laundry, and driving   PERSONAL FACTORS: Age, Fitness, Past/current experiences, Social background, and 1 comorbidity: High BMI-heavy arm  are also affecting patient's  functional outcome.    REHAB POTENTIAL: Good   CLINICAL DECISION MAKING: Evolving/moderate complexity   EVALUATION COMPLEXITY: Moderate     GOALS:   SHORT TERM GOALS: Target date: 07/27/22   Pt will be Ind in an initial HEP  Baseline: Goal status:07/26/22=MET   LONG TERM GOALS: Target date: 09/14/22   Pt will be Ind in a final HEP to maintain achieved LOF Baseline: initiated Goal status: INITIAL   2.  Improve L shoulder PROM to flexion 140, abd 120, ER 50  Baseline: see flow sheet Status: 08/01/22=see flow sheets Goal status: 07/29/22=Improving   3.  Pt's FOTO score will improved to the predicted value of 58% as indication of improved function by DC anticpated to be after 09/14/22 Baseline: 32% Goal status: INITIAL   4.  Develop LTGs after 3//22/24 date when pt is able to particiapte in AROM and strengthening therex Baseline:  Goal status: INITIAL   PLAN:   PT FREQUENCY: 1-2x/week   PT DURATION: 8 weeks   PLANNED INTERVENTIONS: Therapeutic exercises, Therapeutic activity, Patient/Family education, Self Care, Joint mobilization, Aquatic Therapy, Dry Needling, Electrical stimulation, Cryotherapy, Moist heat, Taping, Vasopneumatic device, Ultrasound, Ionotophoresis '4mg'$ /ml Dexamethasone, Manual therapy, and Re-evaluation   PLAN FOR NEXT SESSION: Review FOTO; assess response to HEP; progress therex as indicated; use of modalities, manual therapy; and TPDN as indicated.   Maybell Misenheimer MS, PT 08/21/22 9:40 PM

## 2022-08-22 ENCOUNTER — Ambulatory Visit: Payer: Medicare HMO

## 2022-08-22 DIAGNOSIS — M25512 Pain in left shoulder: Secondary | ICD-10-CM | POA: Diagnosis not present

## 2022-08-22 DIAGNOSIS — F339 Major depressive disorder, recurrent, unspecified: Secondary | ICD-10-CM | POA: Diagnosis not present

## 2022-08-22 DIAGNOSIS — M6281 Muscle weakness (generalized): Secondary | ICD-10-CM | POA: Diagnosis not present

## 2022-08-22 DIAGNOSIS — F1421 Cocaine dependence, in remission: Secondary | ICD-10-CM | POA: Diagnosis not present

## 2022-08-22 DIAGNOSIS — M25612 Stiffness of left shoulder, not elsewhere classified: Secondary | ICD-10-CM

## 2022-08-22 DIAGNOSIS — R6889 Other general symptoms and signs: Secondary | ICD-10-CM | POA: Diagnosis not present

## 2022-08-22 NOTE — Therapy (Signed)
OUTPATIENT PHYSICAL THERAPY TREATMENT NOTE/Progress Note   Patient Name: Rachel Woodard MRN: [id number] DOB:August 06, 1950, 72 y.o., female Today's Date: 08/23/2022  PCP: Scheryl Marten, [place]   REFERRING PROVIDER: Sydnee Cabal, MD  Progress Note Reporting Period 07/12/22 to 08/22/22  See note below for Objective Data and Assessment of Progress/Goals.       END OF SESSION:   PT End of Session - 08/22/22 1155     Visit Number 10    Number of Visits 17    Date for PT Re-Evaluation 09/14/22    Authorization Type [employer] MEDICARE HMO;  [place] Time Period 07/18/22-09/01/22    Authorization - Visit Number 9    Authorization - Number of Visits 12    Progress Note Due on Visit 10    PT Start Time 1152    PT Stop Time 1237    PT Time Calculation (min) 45 min    Activity Tolerance Patient tolerated treatment well    Behavior During Therapy WFL for tasks assessed/performed                   Past Medical History:  Diagnosis Date   Anemia 2001   after gastric bypass   Anxiety    Aortic atherosclerosis (HCC)    Arthritis    Back pain    Bilateral swelling of feet    Bipolar 1 disorder (HCC)    Breast cancer ([place]) 2013   Breast cancer    Radiation therapy   Calcified granuloma of lung (HCC)    Cigarette nicotine dependence    Cigarette nicotine dependence in remission    Cocaine dependence in remission ([place])    COPD (chronic obstructive pulmonary disease) (HCC)    Depression    Disequilibrium    Fibromyalgia    GERD (gastroesophageal reflux disease)    HSV infection    Hypertension    Hypertensive kidney disease with stage 3a chronic kidney disease ([place])    Hypertensive kidney disease with stage 3b chronic kidney disease ([place])    Osteoarthritis    Personal history of infectious disease    Personal history of radiation therapy 2013   Pneumonia    Pre-diabetes    PTSD (post-traumatic stress disorder)    Seasonal  allergic rhinitis due to pollen    Sleep apnea    uses CPAP   SOB (shortness of breath)    Vitamin D deficiency    Past Surgical History:  Procedure Laterality Date   ABDOMINAL HYSTERECTOMY  2018   BREAST LUMPECTOMY Right 2013   GASTRIC BYPASS  2001   TOTAL KNEE ARTHROPLASTY Left 12/22/2018   Procedure: LEFT TOTAL KNEE ARTHROPLASTY;  Surgeon: Leandrew Koyanagi, MD;  Location: [facility];  Service: Orthopedics;  Laterality: Left;   Patient Active Problem List   Diagnosis Date Noted   Pain due to onychomycosis of toenails of both feet 01/20/2020   Shortness of breath 10/05/2019   Status post total left knee replacement 12/22/2018   MDD (major depressive disorder), recurrent episode, mild ([place]) 10/29/2018   Autoimmune hepatitis ([place]) 10/29/2018   OSA (obstructive sleep apnea) 10/29/2018   Renal cyst, right 10/22/2018   Lung nodule, solitary 10/22/2018   Genital herpes simplex 10/29/2017   Vitamin D deficiency 10/29/2017   Morbid obesity ([place]) 10/29/2017   Essential hypertension 10/29/2017   Prediabetes 02/14/2017   Fibromyalgia 02/14/2017   Substance abuse in remission ([place]) 02/14/2017   Anxiety and depression 02/14/2017  HX: breast cancer 02/14/2017   Urge incontinence 11/13/2016   Unilateral primary osteoarthritis, left knee 06/14/2016   GERD (gastroesophageal reflux disease) 11/02/2015   Angioedema 10/24/2013   Tobacco abuse 10/24/2013   DCIS (ductal carcinoma in situ) of breast 04/07/2013   Depression 04/07/2013    REFERRING DIAG: ICD-10:Z47.89: Encounter for other orthopedic aftercare. Left shoulder scope SAD/SCR/RCR  THERAPY DIAG:  Acute pain of left shoulder  Stiffness of left shoulder, not elsewhere classified  Muscle weakness (generalized)  Rationale for Evaluation and Treatment Rehabilitation  PERTINENT HISTORY: High BMI, Arthritis, anxiety, COPD  PRECAUTIONS: Shoulder  07/03/22 Surgery   SUBJECTIVE:                                                                                                                                                                                     SUBJECTIVE STATEMENT:    PAIN:  Are you having pain? Yes: NPRS scale: 5/10 Pain location: L shoulder Pain description: ache, constant Aggravating factors: Dressing Relieving factors: Rest, hot shower, medication   OBJECTIVE: (objective measures completed at initial evaluation unless otherwise dated)   DIAGNOSTIC FINDINGS:  None recent for L shoulder in Epic   PATIENT SURVEYS:  FOTO: Perceived function   32%, predicted   58%; 08/22/22= 50%    COGNITION: Overall cognitive status: Within functional limits for tasks assessed                                  SENSATION: WFL   POSTURE: Forward head c rounded shoulders   UPPER EXTREMITY ROM:    Passive ROM Right eval Left eval Left 07/19/22 07/25/22 LT LT 07/26/22 LT 08/01/22 LT 08/15/22  Shoulder flexion A140 P60 P80 P105 P115 P125 AA150  Shoulder extension           Shoulder abduction A140 P60 P80 P80  100P   Shoulder adduction           Shoulder internal rotation           Shoulder external rotation A70 P40 P40 P50  P60 AA80  Elbow flexion           Elbow extension           Wrist flexion           Wrist extension           Wrist ulnar deviation           Wrist radial deviation           Wrist pronation           Wrist supination           (  Blank rows = not tested)   UPPER EXTREMITY MMT:            NT MMT Right eval Left eval  Shoulder flexion      Shoulder extension      Shoulder abduction      Shoulder adduction      Shoulder internal rotation      Shoulder external rotation      Middle trapezius      Lower trapezius      Elbow flexion      Elbow extension      Wrist flexion      Wrist extension      Wrist ulnar deviation      Wrist radial deviation      Wrist pronation      Wrist supination      Grip strength (lbs)      (Blank rows = not tested)   SHOULDER SPECIAL TESTS: Impingement  tests:  NT SLAP lesions:  NT Instability tests:  NT Rotator cuff assessment:  NT Biceps assessment:  NT   JOINT MOBILITY TESTING:  NT   PALPATION:  TTP of the L peri-shoulder  Next MD appt: 08/13/22             TODAY'S TREATMENT: [facility] Adult PT Treatment:                                                DATE: 08/22/22 Therapeutic Exercise: [place] for shoulder flexion x10 c wand AAROM for shoulder ER x10 c wand Shoulder row 2x10 RTB Shoulder ext 2x10 RTB Shoulder isometric for flex, ext, abd, ER. IR x5 5" 50% effort Manual Therapy: STM/DTM to the L upper trap [place] distraction mobs PROM for flexion and ER Therapeutic Activity: Re-assessed and results reviewed   [facility] Adult PT Treatment:                                                DATE: 08/15/22 Therapeutic Exercise: AAROM for shoulder flexion x10 AAROM for shoulder ER x10 Shoulder row 2x10 RTB Shoulder ext 2x10 RTB AAROM flexion with UE ranger 2x10 Shoulder isometric for flex, ext, abd, ER. IR x5 5" 50% effort Manual Therapy: STM/DTM to the L upper trap GH distraction mobs PROM for flexion and ER  [facility] Adult PT Treatment:                                                DATE: 08/10/22 Therapeutic Exercise: Seated table shoulder AA flexion x10  Seated table shoulder AA scaption x10  Supine shoulder AA ER c wand  x10 Supine shoulder flex/AA c wand x10 SL ER 2x10 Shoulder isometric for flex, ext, abd, ER. IR x5 5" 50% effort Manual Therapy: STM c MTPR to the L upper trap Modalities: Left shoulder Vaso Low pressure , 34 degrees x 15 minutes  Self Care: Use of theracane for L upper trap massage, pt returned demonstration     PATIENT EDUCATION: Education details: Eval findings, POC, HEP, self care Person educated: Patient Education method: Explanation, Demonstration, Tactile cues, Verbal cues, and  Handouts Education comprehension: verbalized understanding, returned demonstration, verbal cues required, and tactile cues  required   HOME EXERCISE PROGRAM: Access Code: [email or web address] URL: [email or web address] Date: 08/10/2022 Prepared by: Gar Ponto  Exercises - Flexion-Extension Shoulder Pendulum with Table Support  - 1 x daily - 7 x weekly - 1 sets - 20 reps - Horizontal Shoulder Pendulum with Table Support  - 1 x daily - 7 x weekly - 1 sets - 20 reps - Standing Shoulder and Trunk Flexion at Table  - 1 x daily - 7 x weekly - 1 sets - 10 reps - 5 hold - Seated Shoulder External Rotation PROM on Table  - 1 x daily - 7 x weekly - 1 sets - 10 reps - 5 hold - Seated Shoulder Flexion Slide at Table Top with Forearm in Neutral  - 1 x daily - 7 x weekly - 2 sets - 10 reps - 3 hold - Seated Shoulder Abduction Towel Slide at Table Top  - 1 x daily - 7 x weekly - 2 sets - 10 reps - 3 hold - Supine Shoulder External Rotation with Dowel  - 1 x daily - 7 x weekly - 1 sets - 10 reps - 3 hold - Supine Shoulder Flexion Extension AAROM with Dowel  - 1 x daily - 7 x weekly - 1 sets - 10 reps - 3 hold - Isometric Shoulder Flexion at Wall  - 1 x daily - 7 x weekly - 1 sets - 10 reps - 5 hold - Isometric Shoulder Extension at Wall  - 1 x daily - 7 x weekly - 1 sets - 10 reps - 5 hold - Isometric Shoulder Abduction at Wall  - 1 x daily - 7 x weekly - 1 sets - 10 reps - 5 hold - Standing Isometric Shoulder External Rotation with Doorway  - 1 x daily - 7 x weekly - 1 sets - 10 reps - 5 hold - Standing Isometric Shoulder Internal Rotation with Towel Roll at Doorway  - 1 x daily - 7 x weekly - 1 sets - 10 reps - 5 hold   ASSESSMENT:   CLINICAL IMPRESSION: PT was completed for ROM per mobilizations and AAROM for the L shoulder f/b isometric strengthening. Pt tolerated PT today without adverse effects. Pt is making appropriate progress with her L shoulder rehab with ROM increasing and pt's FOTO score indicating improved functional use. Pt is tolerating the progression of light strengthening as per protocol. Pt will continue  to benefit from skilled PT to address impairments for improved function of the L shoulder.  OBJECTIVE IMPAIRMENTS: decreased activity tolerance, decreased ROM, decreased strength, impaired UE functional use, pain, and heavy arm .    ACTIVITY LIMITATIONS: carrying, lifting, sleeping, bed mobility, bathing, toileting, dressing, reach over head, and caring for others   PARTICIPATION LIMITATIONS: meal prep, cleaning, laundry, and driving   PERSONAL FACTORS: Age, Fitness, Past/current experiences, Social background, and 1 comorbidity: High BMI-heavy arm  are also affecting patient's functional outcome.    REHAB POTENTIAL: Good   CLINICAL DECISION MAKING: Evolving/moderate complexity   EVALUATION COMPLEXITY: Moderate     GOALS:   SHORT TERM GOALS: Target date: 07/27/22   Pt will be Ind in an initial HEP  Baseline: Goal status:07/26/22=MET   LONG TERM GOALS: Target date: 09/14/22   Pt will be Ind in a final HEP to maintain achieved LOF Baseline: initiated Goal status: INITIAL   2.  Improve L shoulder PROM to flexion 140,  abd 120, ER 50  Baseline: see flow sheet Status: 08/01/22=see flow sheets. 08/15/22=see flow sheet Goal status: Improving   3.  Pt's FOTO score will improved to the predicted value of 58% as indication of improved function by DC anticpated to be after 09/14/22 Baseline: 32% Status:=50% Goal status: Improving   4.  Develop LTGs after 3//22/24 date when pt is able to particiapte in AROM and strengthening therex Baseline:  Goal status: INITIAL   PLAN:   PT FREQUENCY: 1-2x/week   PT DURATION: 8 weeks   PLANNED INTERVENTIONS: Therapeutic exercises, Therapeutic activity, Patient/Family education, Self Care, Joint mobilization, Aquatic Therapy, Dry Needling, Electrical stimulation, Cryotherapy, Moist heat, Taping, Vasopneumatic device, Ultrasound, Ionotophoresis '4mg'$ /ml Dexamethasone, Manual therapy, and Re-evaluation   PLAN FOR NEXT SESSION: Review FOTO; assess response  to HEP; progress therex as indicated; use of modalities, manual therapy; and TPDN as indicated.   Tayvion Lauder MS, PT 08/23/22 6:20 AM

## 2022-08-23 NOTE — Therapy (Signed)
OUTPATIENT PHYSICAL THERAPY TREATMENT NOTE/Progress Note   Patient Name: Rachel Woodard MRN: [id number] DOB:1951/06/04, 72 y.o., female Today's Date: 08/24/2022  PCP: Scheryl Marten, [place]   REFERRING PROVIDER: Sydnee Cabal, MD  Progress Note Reporting Period 07/12/22 to 08/22/22  See note below for Objective Data and Assessment of Progress/Goals.       END OF SESSION:   PT End of Session - 08/24/22 1152     Visit Number 11    Number of Visits 17    Date for PT Re-Evaluation 09/14/22    Authorization Type [employer] MEDICARE HMO;  [place] Time Period 07/18/22-09/01/22 12    Authorization - Visit Number 10    Authorization - Number of Visits 12    Progress Note Due on Visit 10    PT Start Time [id number]    PT Stop Time 1232    PT Time Calculation (min) 43 min                    Past Medical History:  Diagnosis Date   Anemia 2001   after gastric bypass   Anxiety    Aortic atherosclerosis (HCC)    Arthritis    Back pain    Bilateral swelling of feet    Bipolar 1 disorder (HCC)    Breast cancer ([place]) 2013   Breast cancer    Radiation therapy   Calcified granuloma of lung (HCC)    Cigarette nicotine dependence    Cigarette nicotine dependence in remission    Cocaine dependence in remission ([place])    COPD (chronic obstructive pulmonary disease) (HCC)    Depression    Disequilibrium    Fibromyalgia    GERD (gastroesophageal reflux disease)    HSV infection    Hypertension    Hypertensive kidney disease with stage 3a chronic kidney disease ([place])    Hypertensive kidney disease with stage 3b chronic kidney disease ([place])    Osteoarthritis    Personal history of infectious disease    Personal history of radiation therapy 2013   Pneumonia    Pre-diabetes    PTSD (post-traumatic stress disorder)    Seasonal allergic rhinitis due to pollen    Sleep apnea    uses CPAP   SOB (shortness of breath)    Vitamin D deficiency     Past Surgical History:  Procedure Laterality Date   ABDOMINAL HYSTERECTOMY  2018   BREAST LUMPECTOMY Right 2013   GASTRIC BYPASS  2001   TOTAL KNEE ARTHROPLASTY Left 12/22/2018   Procedure: LEFT TOTAL KNEE ARTHROPLASTY;  Surgeon: Leandrew Koyanagi, MD;  Location: [facility];  Service: Orthopedics;  Laterality: Left;   Patient Active Problem List   Diagnosis Date Noted   Pain due to onychomycosis of toenails of both feet 01/20/2020   Shortness of breath 10/05/2019   Status post total left knee replacement 12/22/2018   MDD (major depressive disorder), recurrent episode, mild ([place]) 10/29/2018   Autoimmune hepatitis ([place]) 10/29/2018   OSA (obstructive sleep apnea) 10/29/2018   Renal cyst, right 10/22/2018   Lung nodule, solitary 10/22/2018   Genital herpes simplex 10/29/2017   Vitamin D deficiency 10/29/2017   Morbid obesity ([place]) 10/29/2017   Essential hypertension 10/29/2017   Prediabetes 02/14/2017   Fibromyalgia 02/14/2017   Substance abuse in remission ([place]) 02/14/2017   Anxiety and depression 02/14/2017   HX: breast cancer 02/14/2017   Urge incontinence 11/13/2016   Unilateral primary osteoarthritis, left knee 06/14/2016  GERD (gastroesophageal reflux disease) 11/02/2015   Angioedema 10/24/2013   Tobacco abuse 10/24/2013   DCIS (ductal carcinoma in situ) of breast 04/07/2013   Depression 04/07/2013    REFERRING DIAG: ICD-10:Z47.89: Encounter for other orthopedic aftercare. Left shoulder scope SAD/SCR/RCR  THERAPY DIAG:  Acute pain of left shoulder  Stiffness of left shoulder, not elsewhere classified  Muscle weakness (generalized)  Pain in right leg  Rationale for Evaluation and Treatment Rehabilitation  PERTINENT HISTORY: High BMI, Arthritis, anxiety, COPD  PRECAUTIONS: Shoulder  07/03/22 Surgery   SUBJECTIVE:                                                                                                                                                                                     SUBJECTIVE STATEMENT:    PAIN:  Are you having pain? Yes: NPRS scale: 5/10 Pain location: L shoulder Pain description: ache, constant Aggravating factors: Dressing Relieving factors: Rest, hot shower, medication   OBJECTIVE: (objective measures completed at initial evaluation unless otherwise dated)   DIAGNOSTIC FINDINGS:  None recent for L shoulder in Epic   PATIENT SURVEYS:  FOTO: Perceived function   32%, predicted   58%; 08/22/22= 50%    COGNITION: Overall cognitive status: Within functional limits for tasks assessed                                  SENSATION: WFL   POSTURE: Forward head c rounded shoulders   UPPER EXTREMITY ROM:    Passive ROM Right eval Left eval Left 07/19/22 07/25/22 LT LT 07/26/22 LT 08/01/22 LT 08/15/22 LT 08/24/22  Shoulder flexion A140 P60 P80 P105 P115 P125 AA150 AA155  Shoulder extension            Shoulder abduction A140 P60 P80 P80  100P  AA130  Shoulder adduction            Shoulder internal rotation            Shoulder external rotation A70 P40 P40 P50  P60 AA80 AA80  Elbow flexion            Elbow extension            Wrist flexion            Wrist extension            Wrist ulnar deviation            Wrist radial deviation            Wrist pronation            Wrist supination            (  Blank rows = not tested)   UPPER EXTREMITY MMT:            NT MMT Right eval Left eval  Shoulder flexion      Shoulder extension      Shoulder abduction      Shoulder adduction      Shoulder internal rotation      Shoulder external rotation      Middle trapezius      Lower trapezius      Elbow flexion      Elbow extension      Wrist flexion      Wrist extension      Wrist ulnar deviation      Wrist radial deviation      Wrist pronation      Wrist supination      Grip strength (lbs)      (Blank rows = not tested)   SHOULDER SPECIAL TESTS: Impingement tests:  NT SLAP lesions:  NT Instability tests:   NT Rotator cuff assessment:  NT Biceps assessment:  NT   JOINT MOBILITY TESTING:  NT   PALPATION:  TTP of the L peri-shoulder  Next MD appt: 08/13/22             TODAY'S TREATMENT: [facility] Adult PT Treatment:                                                DATE: 08/24/22 Therapeutic Exercise: Pulleys flexion and scaption 1 min each S/L shoulder ER 2x10 S/L shoulder abd 2x10 Chest press protraction 2x10 Scaption wall slide x10 Manual Therapy: STM/DTM to the L upper trap GH inf glide, distraction, AP grade lll mobs PROM for flexion, abd and ER   [facility] Adult PT Treatment:                                                DATE: 08/22/22 Therapeutic Exercise: AAROM for shoulder flexion x10 c wand AAROM for shoulder ER x10 c wand Shoulder row 2x10 RTB Shoulder ext 2x10 RTB Shoulder isometric for flex, ext, abd, ER. IR x5 5" 50% effort Manual Therapy: STM/DTM to the L upper trap GH distraction mobs PROM for flexion and ER Therapeutic Activity: FOTO Re-assessed and results reviewed     PATIENT EDUCATION: Education details: Eval findings, POC, HEP, self care Person educated: Patient Education method: Explanation, Demonstration, Tactile cues, Verbal cues, and Handouts Education comprehension: verbalized understanding, returned demonstration, verbal cues required, and tactile cues required   HOME EXERCISE PROGRAM: Access Code: [email or web address] URL: [email or web address] Date: 08/10/2022 Prepared by: Gar Ponto  Exercises - Flexion-Extension Shoulder Pendulum with Table Support  - 1 x daily - 7 x weekly - 1 sets - 20 reps - Horizontal Shoulder Pendulum with Table Support  - 1 x daily - 7 x weekly - 1 sets - 20 reps - Standing Shoulder and Trunk Flexion at Table  - 1 x daily - 7 x weekly - 1 sets - 10 reps - 5 hold - Seated Shoulder External Rotation PROM on Table  - 1 x daily - 7 x weekly - 1 sets - 10 reps - 5 hold - Seated Shoulder Flexion Slide at Table Top with Forearm in  Neutral  -  1 x daily - 7 x weekly - 2 sets - 10 reps - 3 hold - Seated Shoulder Abduction Towel Slide at Table Top  - 1 x daily - 7 x weekly - 2 sets - 10 reps - 3 hold - Supine Shoulder External Rotation with [place]el  - 1 x daily - 7 x weekly - 1 sets - 10 reps - 3 hold - Supine Shoulder Flexion Extension AAROM with Dowel  - 1 x daily - 7 x weekly - 1 sets - 10 reps - 3 hold - Isometric Shoulder Flexion at Wall  - 1 x daily - 7 x weekly - 1 sets - 10 reps - 5 hold - Isometric Shoulder Extension at Wall  - 1 x daily - 7 x weekly - 1 sets - 10 reps - 5 hold - Isometric Shoulder Abduction at Wall  - 1 x daily - 7 x weekly - 1 sets - 10 reps - 5 hold - Standing Isometric Shoulder External Rotation with Doorway  - 1 x daily - 7 x weekly - 1 sets - 10 reps - 5 hold - Standing Isometric Shoulder Internal Rotation with Towel Roll at Doorway  - 1 x daily - 7 x weekly - 1 sets - 10 reps - 5 hold   ASSESSMENT:   CLINICAL IMPRESSION: Manual therapy was completed for R upper shoulder pain f/b GH mobs to ROM. AROM was then completed for light strengthening. Pt tolerated PT today without adverse effects. Pt's R shoulder ROM has improved to an appropriate level. Pt will continue to benefit from skilled PT to address impairments for improved function of the L shoulder.  OBJECTIVE IMPAIRMENTS: decreased activity tolerance, decreased ROM, decreased strength, impaired UE functional use, pain, and heavy arm .    ACTIVITY LIMITATIONS: carrying, lifting, sleeping, bed mobility, bathing, toileting, dressing, reach over head, and caring for others   PARTICIPATION LIMITATIONS: meal prep, cleaning, laundry, and driving   PERSONAL FACTORS: Age, Fitness, Past/current experiences, Social background, and 1 comorbidity: High BMI-heavy arm  are also affecting patient's functional outcome.    REHAB POTENTIAL: Good   CLINICAL DECISION MAKING: Evolving/moderate complexity   EVALUATION COMPLEXITY: Moderate     GOALS:    SHORT TERM GOALS: Target date: 07/27/22   Pt will be Ind in an initial HEP  Baseline: Goal status:07/26/22=MET   LONG TERM GOALS: Target date: 09/14/22   Pt will be Ind in a final HEP to maintain achieved LOF Baseline: initiated Goal status: INITIAL   2.  Improve L shoulder PROM to flexion 140, abd 120, ER 50  Baseline: see flow sheet Status: 08/01/22=see flow sheets. 08/15/22=see flow sheet. 08/24/22= see flow sheet  Goal status: MET   3.  Pt's FOTO score will improved to the predicted value of 58% as indication of improved function by DC anticpated to be after 09/14/22 Baseline: 32% Status:=50% Goal status: Improving   4.  Develop LTGs after 3//22/24 date when pt is able to particiapte in AROM and strengthening therex Baseline:  Goal status: INITIAL   PLAN:   PT FREQUENCY: 1-2x/week   PT DURATION: 8 weeks   PLANNED INTERVENTIONS: Therapeutic exercises, Therapeutic activity, Patient/Family education, Self Care, Joint mobilization, Aquatic Therapy, Dry Needling, Electrical stimulation, Cryotherapy, Moist heat, Taping, Vasopneumatic device, Ultrasound, Ionotophoresis '4mg'$ /ml Dexamethasone, Manual therapy, and Re-evaluation   PLAN FOR NEXT SESSION: Review FOTO; assess response to HEP; progress therex as indicated; use of modalities, manual therapy; and TPDN as indicated.   Corianna Avallone MS,  PT 08/24/22 3:57 PM

## 2022-08-24 ENCOUNTER — Ambulatory Visit: Payer: Medicare HMO | Attending: Specialist

## 2022-08-24 DIAGNOSIS — M25612 Stiffness of left shoulder, not elsewhere classified: Secondary | ICD-10-CM

## 2022-08-24 DIAGNOSIS — M79604 Pain in right leg: Secondary | ICD-10-CM | POA: Insufficient documentation

## 2022-08-24 DIAGNOSIS — M6281 Muscle weakness (generalized): Secondary | ICD-10-CM | POA: Diagnosis not present

## 2022-08-24 DIAGNOSIS — M25512 Pain in left shoulder: Secondary | ICD-10-CM | POA: Insufficient documentation

## 2022-08-24 DIAGNOSIS — R6889 Other general symptoms and signs: Secondary | ICD-10-CM | POA: Diagnosis not present

## 2022-08-27 DIAGNOSIS — R1310 Dysphagia, unspecified: Secondary | ICD-10-CM | POA: Diagnosis not present

## 2022-08-27 DIAGNOSIS — J449 Chronic obstructive pulmonary disease, unspecified: Secondary | ICD-10-CM | POA: Diagnosis not present

## 2022-08-27 DIAGNOSIS — N3281 Overactive bladder: Secondary | ICD-10-CM | POA: Diagnosis not present

## 2022-08-27 DIAGNOSIS — Z0000 Encounter for general adult medical examination without abnormal findings: Secondary | ICD-10-CM | POA: Diagnosis not present

## 2022-08-27 DIAGNOSIS — F3341 Major depressive disorder, recurrent, in partial remission: Secondary | ICD-10-CM | POA: Diagnosis not present

## 2022-08-27 DIAGNOSIS — I700 Atherosclerosis of aorta: Secondary | ICD-10-CM | POA: Diagnosis not present

## 2022-08-27 DIAGNOSIS — R6889 Other general symptoms and signs: Secondary | ICD-10-CM | POA: Diagnosis not present

## 2022-08-27 DIAGNOSIS — M5090 Cervical disc disorder, unspecified, unspecified cervical region: Secondary | ICD-10-CM | POA: Diagnosis not present

## 2022-08-27 DIAGNOSIS — Z23 Encounter for immunization: Secondary | ICD-10-CM | POA: Diagnosis not present

## 2022-08-27 DIAGNOSIS — I10 Essential (primary) hypertension: Secondary | ICD-10-CM | POA: Diagnosis not present

## 2022-08-27 DIAGNOSIS — N1831 Chronic kidney disease, stage 3a: Secondary | ICD-10-CM | POA: Diagnosis not present

## 2022-08-27 LAB — COMPREHENSIVE METABOLIC PANEL
Calcium: 9.7 (ref 8.7–10.7)
eGFR: 53

## 2022-08-27 LAB — BASIC METABOLIC PANEL
BUN: 30 — AB (ref 4–21)
CO2: 31 — AB (ref 13–22)
Chloride: 103 (ref 99–108)
Creatinine: 1.1 (ref 0.5–1.1)
Glucose: 90
Potassium: 4.4 mEq/L (ref 3.5–5.1)
Sodium: 140 (ref 137–147)

## 2022-08-27 LAB — LIPID PANEL
Cholesterol: 162 (ref 0–200)
HDL: 55 (ref 35–70)
LDL Cholesterol: 87
Triglycerides: 115 (ref 40–160)

## 2022-08-27 LAB — HEPATIC FUNCTION PANEL
ALT: 17 U/L (ref 7–35)
AST: 14 (ref 13–35)

## 2022-08-27 LAB — CBC: RBC: 4.64 (ref 3.87–5.11)

## 2022-08-27 LAB — CBC AND DIFFERENTIAL
HCT: 40 (ref 36–46)
Hemoglobin: 13.2 (ref 12.0–16.0)
Platelets: 262 10*3/uL (ref 150–400)
WBC: 7.2

## 2022-08-29 DIAGNOSIS — R6889 Other general symptoms and signs: Secondary | ICD-10-CM | POA: Diagnosis not present

## 2022-08-30 ENCOUNTER — Ambulatory Visit: Payer: Medicare HMO

## 2022-08-31 ENCOUNTER — Telehealth: Payer: Self-pay

## 2022-08-31 NOTE — Telephone Encounter (Signed)
Spoke with pt re: missed appt 08/30/22. Pt reports she called to cancel prior to appt time due to transportation issues. Pt was advised of her upcoming appt.

## 2022-09-04 ENCOUNTER — Ambulatory Visit: Payer: Medicare HMO

## 2022-09-04 DIAGNOSIS — M79604 Pain in right leg: Secondary | ICD-10-CM | POA: Diagnosis not present

## 2022-09-04 DIAGNOSIS — M25612 Stiffness of left shoulder, not elsewhere classified: Secondary | ICD-10-CM | POA: Diagnosis not present

## 2022-09-04 DIAGNOSIS — M25512 Pain in left shoulder: Secondary | ICD-10-CM | POA: Diagnosis not present

## 2022-09-04 DIAGNOSIS — M6281 Muscle weakness (generalized): Secondary | ICD-10-CM

## 2022-09-04 NOTE — Therapy (Signed)
OUTPATIENT PHYSICAL THERAPY TREATMENT NOTE/Re-Cert   Patient Name: Rachel Woodard MRN: [id number] DOB:1950-07-24, 72 y.o., female Today's Date: 09/04/2022  PCP: Scheryl Marten, [place]   REFERRING PROVIDER: Sydnee Cabal, MD  See note below for Objective Data and Assessment of Progress/Goals.       END OF SESSION:   PT End of Session - 09/04/22 1156     Visit Number 12    Number of Visits 17    Date for PT Re-Evaluation 09/14/22    Authorization Type [employer] MEDICARE HMO;  [place] Time Period 07/18/22-09/01/22 12    Authorization - Visit Number 11    Authorization - Number of Visits 12    PT Start Time 1150    PT Stop Time [id number]    PT Time Calculation (min) 45 min    Activity Tolerance Patient tolerated treatment well    Behavior During Therapy WFL for tasks assessed/performed                     Past Medical History:  Diagnosis Date   Anemia 2001   after gastric bypass   Anxiety    Aortic atherosclerosis (HCC)    Arthritis    Back pain    Bilateral swelling of feet    Bipolar 1 disorder (HCC)    Breast cancer ([place]) 2013   Breast cancer    Radiation therapy   Calcified granuloma of lung (HCC)    Cigarette nicotine dependence    Cigarette nicotine dependence in remission    Cocaine dependence in remission ([place])    COPD (chronic obstructive pulmonary disease) (HCC)    Depression    Disequilibrium    Fibromyalgia    GERD (gastroesophageal reflux disease)    HSV infection    Hypertension    Hypertensive kidney disease with stage 3a chronic kidney disease ([place])    Hypertensive kidney disease with stage 3b chronic kidney disease ([place])    Osteoarthritis    Personal history of infectious disease    Personal history of radiation therapy 2013   Pneumonia    Pre-diabetes    PTSD (post-traumatic stress disorder)    Seasonal allergic rhinitis due to pollen    Sleep apnea    uses CPAP   SOB (shortness of breath)     Vitamin D deficiency    Past Surgical History:  Procedure Laterality Date   ABDOMINAL HYSTERECTOMY  2018   BREAST LUMPECTOMY Right 2013   GASTRIC BYPASS  2001   TOTAL KNEE ARTHROPLASTY Left 12/22/2018   Procedure: LEFT TOTAL KNEE ARTHROPLASTY;  Surgeon: Leandrew Koyanagi, MD;  Location: [facility];  Service: Orthopedics;  Laterality: Left;   Patient Active Problem List   Diagnosis Date Noted   Pain due to onychomycosis of toenails of both feet 01/20/2020   Shortness of breath 10/05/2019   Status post total left knee replacement 12/22/2018   MDD (major depressive disorder), recurrent episode, mild ([place]) 10/29/2018   Autoimmune hepatitis ([place]) 10/29/2018   OSA (obstructive sleep apnea) 10/29/2018   Renal cyst, right 10/22/2018   Lung nodule, solitary 10/22/2018   Genital herpes simplex 10/29/2017   Vitamin D deficiency 10/29/2017   Morbid obesity ([place]) 10/29/2017   Essential hypertension 10/29/2017   Prediabetes 02/14/2017   Fibromyalgia 02/14/2017   Substance abuse in remission ([place]) 02/14/2017   Anxiety and depression 02/14/2017   HX: breast cancer 02/14/2017   Urge incontinence 11/13/2016   Unilateral primary osteoarthritis, left  knee 06/14/2016   GERD (gastroesophageal reflux disease) 11/02/2015   Angioedema 10/24/2013   Tobacco abuse 10/24/2013   DCIS (ductal carcinoma in situ) of breast 04/07/2013   Depression 04/07/2013    REFERRING DIAG: ICD-10:Z47.89: Encounter for other orthopedic aftercare. Left shoulder scope SAD/SCR/RCR  THERAPY DIAG:  Acute pain of left shoulder  Stiffness of left shoulder, not elsewhere classified  Muscle weakness (generalized)  Rationale for Evaluation and Treatment Rehabilitation  PERTINENT HISTORY: High BMI, Arthritis, anxiety, COPD  PRECAUTIONS: Shoulder  07/03/22 Surgery   SUBJECTIVE:                                                                                                                                                                                     SUBJECTIVE STATEMENT:  Pt reports she has been using the sling to rest her L arm when it is more painful or fatigued. Pt reports general use of her L shoulder/UE is continuing to improve.  PAIN:  Are you having pain? Yes: NPRS scale: 1/10 Pain location: L shoulder Pain description: ache, constant Aggravating factors: Dressing Relieving factors: Rest, hot shower, medication   OBJECTIVE: (objective measures completed at initial evaluation unless otherwise dated)   DIAGNOSTIC FINDINGS:  None recent for L shoulder in Epic   PATIENT SURVEYS:  FOTO: Perceived function   32%, predicted   58%; 08/22/22= 50%    COGNITION: Overall cognitive status: Within functional limits for tasks assessed                                  SENSATION: WFL   POSTURE: Forward head c rounded shoulders   UPPER EXTREMITY ROM:    Passive ROM Right eval Left eval Left 07/19/22 07/25/22 LT LT 07/26/22 LT 08/01/22 LT 08/15/22 LT 08/24/22 Lt 09/04/22  Shoulder flexion A140 P60 P80 P105 P115 P125 AA150 AA155 A100  Shoulder extension             Shoulder abduction A140 P60 P80 P80  100P  AA130   Shoulder adduction             Shoulder internal rotation             Shoulder external rotation A70 P40 P40 P50  P60 AA80 AA80   Elbow flexion             Elbow extension             Wrist flexion             Wrist extension             Wrist ulnar deviation  Wrist radial deviation             Wrist pronation             Wrist supination             (Blank rows = not tested)   UPPER EXTREMITY MMT:            NT MMT Right eval Left eval  Shoulder flexion      Shoulder extension      Shoulder abduction      Shoulder adduction      Shoulder internal rotation      Shoulder external rotation      Middle trapezius      Lower trapezius      Elbow flexion      Elbow extension      Wrist flexion      Wrist extension      Wrist ulnar deviation      Wrist radial deviation       Wrist pronation      Wrist supination      Grip strength (lbs)      (Blank rows = not tested)   SHOULDER SPECIAL TESTS: Impingement tests:  NT SLAP lesions:  NT Instability tests:  NT Rotator cuff assessment:  NT Biceps assessment:  NT   JOINT MOBILITY TESTING:  NT   PALPATION:  TTP of the L peri-shoulder               TODAY'S TREATMENT: [facility] Adult PT Treatment:                                                DATE: 09/04/22 Therapeutic Exercise: UBE 4 mins, forward/backward 2 mins each Pulleys flexion and scaption 1 min each S/L shoulder ER 3x10 1#tried initially which increased pain and 13 wt was Dced for this session S/L shoulder abd 3x10 Standing shoulder row GTB2x15 Standing shoulder ext GTB2x15 Wall press c shoulder protraction 2x10 Flexion wall slide x10  [facility] Adult PT Treatment:                                                DATE: 08/24/22 Therapeutic Exercise: Pulleys flexion and scaption 1 min each S/L shoulder ER 2x10 S/L shoulder abd 2x10 Chest press protraction 2x10 Scaption wall slide x10 Manual Therapy: STM/DTM to the L upper trap GH inf glide, distraction, AP grade lll mobs PROM for flexion, abd and ER     PATIENT EDUCATION: Education details: Eval findings, POC, HEP, self care Person educated: Patient Education method: Explanation, Demonstration, Tactile cues, Verbal cues, and Handouts Education comprehension: verbalized understanding, returned demonstration, verbal cues required, and tactile cues required   HOME EXERCISE PROGRAM: Access Code: [email or web address] URL: [email or web address] Date: 08/10/2022 Prepared by: Gar Ponto  Exercises - Flexion-Extension Shoulder Pendulum with Table Support  - 1 x daily - 7 x weekly - 1 sets - 20 reps - Horizontal Shoulder Pendulum with Table Support  - 1 x daily - 7 x weekly - 1 sets - 20 reps - Standing Shoulder and Trunk Flexion at Table  - 1 x daily - 7 x weekly - 1 sets - 10 reps - 5 hold -  Seated Shoulder  External Rotation PROM on Table  - 1 x daily - 7 x weekly - 1 sets - 10 reps - 5 hold - Seated Shoulder Flexion Slide at Table Top with Forearm in Neutral  - 1 x daily - 7 x weekly - 2 sets - 10 reps - 3 hold - Seated Shoulder Abduction Towel Slide at Table Top  - 1 x daily - 7 x weekly - 2 sets - 10 reps - 3 hold - Supine Shoulder External Rotation with Dowel  - 1 x daily - 7 x weekly - 1 sets - 10 reps - 3 hold - Supine Shoulder Flexion Extension AAROM with Dowel  - 1 x daily - 7 x weekly - 1 sets - 10 reps - 3 hold - Isometric Shoulder Flexion at Wall  - 1 x daily - 7 x weekly - 1 sets - 10 reps - 5 hold - Isometric Shoulder Extension at Wall  - 1 x daily - 7 x weekly - 1 sets - 10 reps - 5 hold - Isometric Shoulder Abduction at Wall  - 1 x daily - 7 x weekly - 1 sets - 10 reps - 5 hold - Standing Isometric Shoulder External Rotation with Doorway  - 1 x daily - 7 x weekly - 1 sets - 10 reps - 5 hold - Standing Isometric Shoulder Internal Rotation with Towel Roll at Doorway  - 1 x daily - 7 x weekly - 1 sets - 10 reps - 5 hold   ASSESSMENT:   CLINICAL IMPRESSION: PT was completed for Lt shoulder AROM, periscapular strengthening, and light RC strengthening. 1# weight  was attempted for S/L ER but pt reported an increase in pain. The pt then tolerated this therex without weight.  Pt is currently 9 weeks s/p L shoulder arthroscopic SAD/SCR/RCR surgery and is progressing appropriately. Active assisted and active ROM of the L shoulder is making good progress, and per protocol, light strengthening of the L shoulder RC has been initiated as tolerated. A cold pack was offered at the end of the session, but pt declined, stating she was going to rest her L arm when she gets home. Pt will continue to benefit from skilled PT 2w6 to address impairments of the L shoulder for improved function with less pain.   OBJECTIVE IMPAIRMENTS: decreased activity tolerance, decreased ROM, decreased strength,  impaired UE functional use, pain, and heavy arm .    ACTIVITY LIMITATIONS: carrying, lifting, sleeping, bed mobility, bathing, toileting, dressing, reach over head, and caring for others   PARTICIPATION LIMITATIONS: meal prep, cleaning, laundry, and driving   PERSONAL FACTORS: Age, Fitness, Past/current experiences, Social background, and 1 comorbidity: High BMI-heavy arm  are also affecting patient's functional outcome.    REHAB POTENTIAL: Good   CLINICAL DECISION MAKING: Evolving/moderate complexity   EVALUATION COMPLEXITY: Moderate     GOALS:   SHORT TERM GOALS: Target date: 07/27/22   Pt will be Ind in an initial HEP  Baseline: Goal status:07/26/22=MET   LONG TERM GOALS: Target date: 11/02/22   Pt will be Ind in a final HEP to maintain achieved LOF Baseline: initiated Goal status: Ongoing   2.  Improve L shoulder PROM to flexion 140, abd 120, ER 50  Baseline: see flow sheet Status: 08/01/22=see flow sheets. 08/15/22=see flow sheet. 08/24/22= see flow sheet  Goal status: MET   3.  Pt's FOTO score will improved to the predicted value of 58% as indication of improved function by DC anticpated to be  after 09/14/22 Baseline: 32% Status:=50% Goal status: Improving   4.  Develop LTGs after 3//22/24 date when pt is able to particiapte in AROM and strengthening therex Baseline:  Goal status: MET 09/04/22  5.  Pt's will demonstrate  AROM of the L shoulder wihtin 90% of the R for god functional ROM  Baseline: See flow sheet Goal status: New as of 09/04/22  6. Pt will demonstrate 4+/5 L shoulder strength for all motions for appropriate function of the L shoulder Baseline: NT Goal status: New as of 09/04/22  7. Pt will be able to lift 8# to shoulder height x5 for strength with functional activities at home Baseline: NT Goal status: New as of 09/04/22    PLAN:   PT FREQUENCY: 2x/week   PT DURATION: 8 weeks   PLANNED INTERVENTIONS: Therapeutic exercises, Therapeutic activity,  Patient/Family education, Self Care, Joint mobilization, Aquatic Therapy, Dry Needling, Electrical stimulation, Cryotherapy, Moist heat, Taping, Vasopneumatic device, Ultrasound, Ionotophoresis '4mg'$ /ml Dexamethasone, Manual therapy, and Re-evaluation   PLAN FOR NEXT SESSION: Review FOTO; assess response to HEP; progress therex as indicated; use of modalities, manual therapy; and TPDN as indicated.   Tyffani Foglesong MS, PT 09/04/22 6:27 PM   Referring diagnosis? ICD-10:Z47.89: Encounter for other orthopedic aftercare. Left shoulder scope SAD/SCR/RCR  Treatment diagnosis? (if different than referring diagnosis) Acute pain of left shoulder, Stiffness of left shoulder, not elsewhere classified, Muscle weakness (generalized)  What was this (referring dx) caused by? '[x]'$  Surgery '[]'$  Fall '[]'$  Ongoing issue '[]'$  Arthritis '[]'$  Other: ____________  Laterality: '[]'$  Rt '[x]'$  Lt '[]'$  Both  Check all possible CPT codes:  *CHOOSE 10 OR LESS*    '[x]'$  97110 (Therapeutic Exercise)  '[]'$  92507 (SLP Treatment)  '[]'$  97112 (Neuro Re-ed)   '[]'$  92526 (Swallowing Treatment)   '[]'$  97116 (Gait Training)   '[]'$  [id number] (Cognitive Training, 1st 15 minutes) '[x]'$  97140 (Manual Therapy)   '[]'$  97130 (Cognitive Training, each add'l 15 minutes)  '[x]'$  97164 (Re-evaluation)                              '[]'$  Other, List CPT Code ____________  '[x]'$  97530 (Therapeutic Activities)     '[x]'$  97535 (Self Care)   '[]'$  All codes above (97110 - 97535)  '[]'$  97012 (Mechanical Traction)  '[x]'$  97014 (E-stim Unattended)  '[x]'$  97032 (E-stim manual)  '[x]'$  97033 (Ionto)  '[x]'$  97035 (Ultrasound) '[]'$  97750 (Physical Performance Training) '[x]'$  [id number] (Aquatic Therapy) '[x]'$  97016 (Vasopneumatic Device) '[]'$  [id number] (Paraffin) '[]'$  97034 (Contrast Bath) '[]'$  97597 (Wound Care 1st 20 sq cm) '[]'$  97598 (Wound Care each add'l 20 sq cm) '[]'$  97760 (Orthotic Fabrication, Fitting, Training Initial) '[]'$  [id number] (Prosthetic Management and Training Initial) '[]'$  [id number] (Orthotic or Prosthetic  Training/ Modification Subsequent)

## 2022-09-05 ENCOUNTER — Ambulatory Visit: Payer: Medicare HMO | Admitting: Nurse Practitioner

## 2022-09-05 NOTE — Therapy (Signed)
OUTPATIENT PHYSICAL THERAPY TREATMENT NOTE/Re-Cert   Patient Name: Rachel Woodard MRN: [id number] DOB:[birth year], 72 y.o., female Today's Date: 09/06/2022  PCP: Scheryl Marten, [place]   REFERRING PROVIDER: Sydnee Cabal, MD  See note below for Objective Data and Assessment of Progress/Goals.       END OF SESSION:   PT End of Session - 09/06/22 1328     Visit Number 13    Number of Visits 28    Date for PT Re-Evaluation 11/02/22    Authorization Type HU[employer] MEDICARE HMO;  [place] Time Period Approved 12 visits 09/04/22-10/20/22; 07/18/22-09/01/22 12    Authorization - Visit Number 12    Authorization - Number of Visits 23    PT Start Time 1330    PT Stop Time [id number]    PT Time Calculation (min) 53 min    Activity Tolerance Patient tolerated treatment well    Behavior During Therapy WFL for tasks assessed/performed                     Past Medical History:  Diagnosis Date   Anemia 2001   after gastric bypass   Anxiety    Aortic atherosclerosis (HCC)    Arthritis    Back pain    Bilateral swelling of feet    Bipolar 1 disorder (HCC)    Breast cancer ([place]) 2013   Breast cancer    Radiation therapy   Calcified granuloma of lung (HCC)    Cigarette nicotine dependence    Cigarette nicotine dependence in remission    Cocaine dependence in remission ([place])    COPD (chronic obstructive pulmonary disease) (HCC)    Depression    Disequilibrium    Fibromyalgia    GERD (gastroesophageal reflux disease)    HSV infection    Hypertension    Hypertensive kidney disease with stage 3a chronic kidney disease ([place])    Hypertensive kidney disease with stage 3b chronic kidney disease ([place])    Osteoarthritis    Personal history of infectious disease    Personal history of radiation therapy 2013   Pneumonia    Pre-diabetes    PTSD (post-traumatic stress disorder)    Seasonal allergic rhinitis due to pollen    Sleep apnea     uses CPAP   SOB (shortness of breath)    Vitamin D deficiency    Past Surgical History:  Procedure Laterality Date   ABDOMINAL HYSTERECTOMY  2018   BREAST LUMPECTOMY Right 2013   GASTRIC BYPASS  2001   TOTAL KNEE ARTHROPLASTY Left 12/22/2018   Procedure: LEFT TOTAL KNEE ARTHROPLASTY;  Surgeon: Leandrew Koyanagi, MD;  Location: [facility];  Service: Orthopedics;  Laterality: Left;   Patient Active Problem List   Diagnosis Date Noted   Pain due to onychomycosis of toenails of both feet 01/20/2020   Shortness of breath 10/05/2019   Status post total left knee replacement 12/22/2018   MDD (major depressive disorder), recurrent episode, mild ([place]) 10/29/2018   Autoimmune hepatitis ([place]) 10/29/2018   OSA (obstructive sleep apnea) 10/29/2018   Renal cyst, right 10/22/2018   Lung nodule, solitary 10/22/2018   Genital herpes simplex 10/29/2017   Vitamin D deficiency 10/29/2017   Morbid obesity ([place]) 10/29/2017   Essential hypertension 10/29/2017   Prediabetes 02/14/2017   Fibromyalgia 02/14/2017   Substance abuse in remission ([place]) 02/14/2017   Anxiety and depression 02/14/2017   HX: breast cancer 02/14/2017   Urge incontinence 11/13/2016  Unilateral primary osteoarthritis, left knee 06/14/2016   GERD (gastroesophageal reflux disease) 11/02/2015   Angioedema 10/24/2013   Tobacco abuse 10/24/2013   DCIS (ductal carcinoma in situ) of breast 04/07/2013   Depression 04/07/2013    REFERRING DIAG: ICD-10:Z47.89: Encounter for other orthopedic aftercare. Left shoulder scope SAD/SCR/RCR  THERAPY DIAG:  Acute pain of left shoulder  Stiffness of left shoulder, not elsewhere classified  Muscle weakness (generalized)  Rationale for Evaluation and Treatment Rehabilitation  PERTINENT HISTORY: High BMI, Arthritis, anxiety, COPD  PRECAUTIONS: Shoulder  07/03/22 Surgery   SUBJECTIVE:                                                                                                                                                                                     SUBJECTIVE STATEMENT:  Pt reports her L shoulder was more sore after the last PT session. She has rested her L shoulder and the soreness is much better.  PAIN:  Are you having pain? Yes: NPRS scale: 0/10 Pain location: L shoulder Pain description: ache, constant Aggravating factors: Dressing Relieving factors: Rest, hot shower, medication   OBJECTIVE: (objective measures completed at initial evaluation unless otherwise dated)   DIAGNOSTIC FINDINGS:  None recent for L shoulder in Epic   PATIENT SURVEYS:  FOTO: Perceived function   32%, predicted   58%; 08/22/22= 50%    COGNITION: Overall cognitive status: Within functional limits for tasks assessed                                  SENSATION: WFL   POSTURE: Forward head c rounded shoulders   UPPER EXTREMITY ROM:    Passive ROM Right eval Left eval Left 07/19/22 07/25/22 LT LT 07/26/22 LT 08/01/22 LT 08/15/22 LT 08/24/22 Lt 09/04/22  Shoulder flexion A140 P60 P80 P105 P115 P125 AA150 AA155 A100  Shoulder extension             Shoulder abduction A140 P60 P80 P80  100P  AA130   Shoulder adduction             Shoulder internal rotation             Shoulder external rotation A70 P40 P40 P50  P60 AA80 AA80   Elbow flexion             Elbow extension             Wrist flexion             Wrist extension             Wrist ulnar deviation  Wrist radial deviation             Wrist pronation             Wrist supination             (Blank rows = not tested)  AROM flex 09/06/22= 112d  UPPER EXTREMITY MMT:            NT MMT Right eval Left eval  Shoulder flexion      Shoulder extension      Shoulder abduction      Shoulder adduction      Shoulder internal rotation      Shoulder external rotation      Middle trapezius      Lower trapezius      Elbow flexion      Elbow extension      Wrist flexion      Wrist extension      Wrist ulnar deviation       Wrist radial deviation      Wrist pronation      Wrist supination      Grip strength (lbs)      (Blank rows = not tested)   SHOULDER SPECIAL TESTS: Impingement tests:  NT SLAP lesions:  NT Instability tests:  NT Rotator cuff assessment:  NT Biceps assessment:  NT   JOINT MOBILITY TESTING:  NT   PALPATION:  TTP of the L peri-shoulder           TODAY'S TREATMENT: [facility] Adult PT Treatment:                                                DATE: 09/06/22 Therapeutic Exercise: Pulleys flexion and scaption 1 min each Shoulder ranger for flexion and scaption x15 each Shoulder flexion c outward press c pillow case on wall 2x10 Shoulder isometric flexion, abd, ER, IR, ext x10 5" Standing shoulder row GTB2x15 Standing shoulder ext GTB2x15 Supine shoulder flexion 2x10 Modalities: CP to L shoulder x10 mins  [facility] Adult PT Treatment:                                                DATE: 09/04/22 Therapeutic Exercise: UBE 4 mins, forward/backward 2 mins each Pulleys flexion and scaption 1 min each S/L shoulder ER 3x10 1#tried initially which increased pain and 13 wt was Dced for this session S/L shoulder abd 3x10 Standing shoulder row GTB2x15 Standing shoulder ext GTB2x15 Wall press c shoulder protraction 2x10 Flexion wall slide x10  [facility] Adult PT Treatment:                                                DATE: 08/24/22 Therapeutic Exercise: Pulleys flexion and scaption 1 min each S/L shoulder ER 2x10 S/L shoulder abd 2x10 Chest press protraction 2x10 Scaption wall slide x10 Manual Therapy: STM/DTM to the L upper trap GH inf glide, distraction, AP grade lll mobs PROM for flexion, abd and ER     PATIENT EDUCATION: Education details: Eval findings, POC, HEP, self care Person educated: Patient Education method: Explanation, Demonstration, Tactile  cues, Verbal cues, and Handouts Education comprehension: verbalized understanding, returned demonstration, verbal cues required, and  tactile cues required   HOME EXERCISE PROGRAM: Access Code: [email or web address] URL: [email or web address] Date: 08/10/2022 Prepared by: Gar Ponto  Exercises - Flexion-Extension Shoulder Pendulum with Table Support  - 1 x daily - 7 x weekly - 1 sets - 20 reps - Horizontal Shoulder Pendulum with Table Support  - 1 x daily - 7 x weekly - 1 sets - 20 reps - Standing Shoulder and Trunk Flexion at Table  - 1 x daily - 7 x weekly - 1 sets - 10 reps - 5 hold - Seated Shoulder External Rotation PROM on Table  - 1 x daily - 7 x weekly - 1 sets - 10 reps - 5 hold - Seated Shoulder Flexion Slide at Table Top with Forearm in Neutral  - 1 x daily - 7 x weekly - 2 sets - 10 reps - 3 hold - Seated Shoulder Abduction Towel Slide at Table Top  - 1 x daily - 7 x weekly - 2 sets - 10 reps - 3 hold - Supine Shoulder External Rotation with Dowel  - 1 x daily - 7 x weekly - 1 sets - 10 reps - 3 hold - Supine Shoulder Flexion Extension AAROM with Dowel  - 1 x daily - 7 x weekly - 1 sets - 10 reps - 3 hold - Isometric Shoulder Flexion at Wall  - 1 x daily - 7 x weekly - 1 sets - 10 reps - 5 hold - Isometric Shoulder Extension at Wall  - 1 x daily - 7 x weekly - 1 sets - 10 reps - 5 hold - Isometric Shoulder Abduction at Wall  - 1 x daily - 7 x weekly - 1 sets - 10 reps - 5 hold - Standing Isometric Shoulder External Rotation with Doorway  - 1 x daily - 7 x weekly - 1 sets - 10 reps - 5 hold - Standing Isometric Shoulder Internal Rotation with Towel Roll at Doorway  - 1 x daily - 7 x weekly - 1 sets - 10 reps - 5 hold   ASSESSMENT:   CLINICAL IMPRESSION: Pt experienced an increase in L shoulder pain after the last session which resolved with rest. PT was completed for ROM and strengthening therex at a lower level than the last session. AROM for flexion in standing has continue to improve from 110d to 112d. Cold pack was provided at end of session for symptom management.   OBJECTIVE IMPAIRMENTS: decreased  activity tolerance, decreased ROM, decreased strength, impaired UE functional use, pain, and heavy arm .    ACTIVITY LIMITATIONS: carrying, lifting, sleeping, bed mobility, bathing, toileting, dressing, reach over head, and caring for others   PARTICIPATION LIMITATIONS: meal prep, cleaning, laundry, and driving   PERSONAL FACTORS: Age, Fitness, Past/current experiences, Social background, and 1 comorbidity: High BMI-heavy arm  are also affecting patient's functional outcome.    REHAB POTENTIAL: Good   CLINICAL DECISION MAKING: Evolving/moderate complexity   EVALUATION COMPLEXITY: Moderate     GOALS:   SHORT TERM GOALS: Target date: 07/27/22   Pt will be Ind in an initial HEP  Baseline: Goal status:07/26/22=MET   LONG TERM GOALS: Target date: 11/02/22   Pt will be Ind in a final HEP to maintain achieved LOF Baseline: initiated Goal status: Ongoing   2.  Improve L shoulder PROM to flexion 140, abd 120, ER 50  Baseline: see flow sheet Status: 08/01/22=see flow  sheets. 08/15/22=see flow sheet. 08/24/22= see flow sheet  Goal status: MET   3.  Pt's FOTO score will improved to the predicted value of 58% as indication of improved function by DC anticpated to be after 09/14/22 Baseline: 32% Status:=50% Goal status: Improving   4.  Develop LTGs after 09/14/22 date when pt is able to particiapte in AROM and strengthening therex Baseline:  Goal status: MET 09/04/22  5.  Pt's will demonstrate  AROM of the L shoulder wihtin 90% of the R for god functional ROM  Baseline: See flow sheet Goal status: New as of 09/04/22  6. Pt will demonstrate 4+/5 L shoulder strength for all motions for appropriate function of the L shoulder Baseline: NT Goal status: New as of 09/04/22  7. Pt will be able to lift 8# to shoulder height x5 for strength with functional activities at home Baseline: NT Goal status: New as of 09/04/22    PLAN:   PT FREQUENCY: 2x/week   PT DURATION: 8 weeks   PLANNED  INTERVENTIONS: Therapeutic exercises, Therapeutic activity, Patient/Family education, Self Care, Joint mobilization, Aquatic Therapy, Dry Needling, Electrical stimulation, Cryotherapy, Moist heat, Taping, Vasopneumatic device, Ultrasound, Ionotophoresis '4mg'$ /ml Dexamethasone, Manual therapy, and Re-evaluation   PLAN FOR NEXT SESSION: Review FOTO; assess response to HEP; progress therex as indicated; use of modalities, manual therapy; and TPDN as indicated.   Sidnee Gambrill MS, PT 09/06/22 5:59 PM

## 2022-09-06 ENCOUNTER — Ambulatory Visit: Payer: Medicare HMO

## 2022-09-06 DIAGNOSIS — M6281 Muscle weakness (generalized): Secondary | ICD-10-CM

## 2022-09-06 DIAGNOSIS — M25612 Stiffness of left shoulder, not elsewhere classified: Secondary | ICD-10-CM | POA: Diagnosis not present

## 2022-09-06 DIAGNOSIS — M25512 Pain in left shoulder: Secondary | ICD-10-CM | POA: Diagnosis not present

## 2022-09-06 DIAGNOSIS — M79604 Pain in right leg: Secondary | ICD-10-CM | POA: Diagnosis not present

## 2022-09-10 ENCOUNTER — Other Ambulatory Visit: Payer: Self-pay | Admitting: Internal Medicine

## 2022-09-10 DIAGNOSIS — Z1231 Encounter for screening mammogram for malignant neoplasm of breast: Secondary | ICD-10-CM

## 2022-09-11 ENCOUNTER — Ambulatory Visit: Payer: Medicare HMO

## 2022-09-11 DIAGNOSIS — M25512 Pain in left shoulder: Secondary | ICD-10-CM

## 2022-09-11 DIAGNOSIS — M6281 Muscle weakness (generalized): Secondary | ICD-10-CM

## 2022-09-11 DIAGNOSIS — M25612 Stiffness of left shoulder, not elsewhere classified: Secondary | ICD-10-CM

## 2022-09-11 DIAGNOSIS — M79604 Pain in right leg: Secondary | ICD-10-CM | POA: Diagnosis not present

## 2022-09-11 NOTE — Therapy (Signed)
OUTPATIENT PHYSICAL THERAPY TREATMENT NOTE   Patient Name: Rachel Woodard MRN: [id number] DOB:[birth year], 72 y.o., female Today's Date: 09/11/2022  PCP: Scheryl Marten, [place]   REFERRING PROVIDER: Sydnee Cabal, MD  See note below for Objective Data and Assessment of Progress/Goals.   END OF SESSION:   PT End of Session - 09/11/22 1024     Visit Number 14    Number of Visits 24    Date for PT Re-Evaluation 11/02/22    Authorization Type [employer] MEDICARE HMO;  [place] Time Period Approved 12 visits 09/04/22-10/20/22; 07/18/22-09/01/22 12    Authorization - Visit Number 34    Authorization - Number of Visits 24    PT Start Time [id number]    PT Stop Time 1100    PT Time Calculation (min) 42 min    Equipment Utilized During Treatment --    Activity Tolerance Patient tolerated treatment well    Behavior During Therapy WFL for tasks assessed/performed                     Past Medical History:  Diagnosis Date   Anemia 2001   after gastric bypass   Anxiety    Aortic atherosclerosis (HCC)    Arthritis    Back pain    Bilateral swelling of feet    Bipolar 1 disorder (HCC)    Breast cancer ([place]) 2013   Breast cancer    Radiation therapy   Calcified granuloma of lung (HCC)    Cigarette nicotine dependence    Cigarette nicotine dependence in remission    Cocaine dependence in remission ([place])    COPD (chronic obstructive pulmonary disease) (HCC)    Depression    Disequilibrium    Fibromyalgia    GERD (gastroesophageal reflux disease)    HSV infection    Hypertension    Hypertensive kidney disease with stage 3a chronic kidney disease ([place])    Hypertensive kidney disease with stage 3b chronic kidney disease ([place])    Osteoarthritis    Personal history of infectious disease    Personal history of radiation therapy 2013   Pneumonia    Pre-diabetes    PTSD (post-traumatic stress disorder)    Seasonal allergic rhinitis due to  pollen    Sleep apnea    uses CPAP   SOB (shortness of breath)    Vitamin D deficiency    Past Surgical History:  Procedure Laterality Date   ABDOMINAL HYSTERECTOMY  2018   BREAST LUMPECTOMY Right 2013   GASTRIC BYPASS  2001   TOTAL KNEE ARTHROPLASTY Left 12/22/2018   Procedure: LEFT TOTAL KNEE ARTHROPLASTY;  Surgeon: Leandrew Koyanagi, MD;  Location: [facility];  Service: Orthopedics;  Laterality: Left;   Patient Active Problem List   Diagnosis Date Noted   Pain due to onychomycosis of toenails of both feet 01/20/2020   Shortness of breath 10/05/2019   Status post total left knee replacement 12/22/2018   MDD (major depressive disorder), recurrent episode, mild ([place]) 10/29/2018   Autoimmune hepatitis ([place]) 10/29/2018   OSA (obstructive sleep apnea) 10/29/2018   Renal cyst, right 10/22/2018   Lung nodule, solitary 10/22/2018   Genital herpes simplex 10/29/2017   Vitamin D deficiency 10/29/2017   Morbid obesity ([place]) 10/29/2017   Essential hypertension 10/29/2017   Prediabetes 02/14/2017   Fibromyalgia 02/14/2017   Substance abuse in remission ([place]) 02/14/2017   Anxiety and depression 02/14/2017   HX: breast cancer 02/14/2017   Urge  incontinence 11/13/2016   Unilateral primary osteoarthritis, left knee 06/14/2016   GERD (gastroesophageal reflux disease) 11/02/2015   Angioedema 10/24/2013   Tobacco abuse 10/24/2013   DCIS (ductal carcinoma in situ) of breast 04/07/2013   Depression 04/07/2013    REFERRING DIAG: ICD-10:Z47.89: Encounter for other orthopedic aftercare. Left shoulder scope SAD/SCR/RCR  THERAPY DIAG:  Acute pain of left shoulder  Stiffness of left shoulder, not elsewhere classified  Muscle weakness (generalized)  Rationale for Evaluation and Treatment Rehabilitation  PERTINENT HISTORY: High BMI, Arthritis, anxiety, COPD  PRECAUTIONS: Shoulder  07/03/22 Surgery   SUBJECTIVE:                                                                                                                                                                                     SUBJECTIVE STATEMENT:  Pt reports having L shoulder soreness on Saturday which resolved after using a a heating pad.  PAIN:  Are you having pain? Yes: NPRS scale: 0/10 Pain location: L shoulder Pain description: ache, constant Aggravating factors: Dressing Relieving factors: Rest, hot shower, medication   OBJECTIVE: (objective measures completed at initial evaluation unless otherwise dated)   DIAGNOSTIC FINDINGS:  None recent for L shoulder in Epic   PATIENT SURVEYS:  FOTO: Perceived function   32%, predicted   58%; 08/22/22= 50%    COGNITION: Overall cognitive status: Within functional limits for tasks assessed                                  SENSATION: WFL   POSTURE: Forward head c rounded shoulders   UPPER EXTREMITY ROM:    Passive ROM Right eval Left eval Left 07/19/22 07/25/22 LT LT 07/26/22 LT 08/01/22 LT 08/15/22 LT 08/24/22 Lt 09/04/22  Shoulder flexion A140 P60 P80 P105 P115 P125 AA150 AA155 A100  Shoulder extension             Shoulder abduction A140 P60 P80 P80  100P  AA130   Shoulder adduction             Shoulder internal rotation             Shoulder external rotation A70 P40 P40 P50  P60 AA80 AA80   Elbow flexion             Elbow extension             Wrist flexion             Wrist extension             Wrist ulnar deviation  Wrist radial deviation             Wrist pronation             Wrist supination             (Blank rows = not tested)  AROM flex 09/06/22= 112d  UPPER EXTREMITY MMT:            NT MMT Right eval Left eval  Shoulder flexion      Shoulder extension      Shoulder abduction      Shoulder adduction      Shoulder internal rotation      Shoulder external rotation      Middle trapezius      Lower trapezius      Elbow flexion      Elbow extension      Wrist flexion      Wrist extension      Wrist ulnar deviation       Wrist radial deviation      Wrist pronation      Wrist supination      Grip strength (lbs)      (Blank rows = not tested)   SHOULDER SPECIAL TESTS: Impingement tests:  NT SLAP lesions:  NT Instability tests:  NT Rotator cuff assessment:  NT Biceps assessment:  NT   JOINT MOBILITY TESTING:  NT   PALPATION:  TTP of the L peri-shoulder           TODAY'S TREATMENT: [facility] Adult PT Treatment:                                                DATE: 09/11/22 Therapeutic Exercise: UBE L1 23min 2 mins FWD, 2 mins BWD Pulleys flexion and scaption 1 min each AAROM for flexion and scaption c wand x10 each Standing shoulder row GTB 2x15 Standing shoulder ext GTB 2x15 Standing shoulder ER YTB 2x15 Standing shoulder IR RTB2x15 HEP updated  [facility] Adult PT Treatment:                                                DATE: 09/06/22 Therapeutic Exercise: Pulleys flexion and scaption 1 min each Shoulder ranger for flexion and scaption x15 each Shoulder flexion c outward press c pillow case on wall 2x10 Shoulder isometric flexion, abd, ER, IR, ext x10 5" Standing shoulder row GTB2x15 Standing shoulder ext GTB2x15 Supine shoulder flexion 2x10 Modalities: CP to L shoulder x10 mins  [facility] Adult PT Treatment:                                                DATE: 09/04/22 Therapeutic Exercise: UBE 4 mins, forward/backward 2 mins each Pulleys flexion and scaption 1 min each S/L shoulder ER 3x10 1#tried initially which increased pain and 13 wt was Dced for this session S/L shoulder abd 3x10 Standing shoulder row GTB2x15 Standing shoulder ext GTB2x15 Wall press c shoulder protraction 2x10 Flexion wall slide x10  [facility] Adult PT Treatment:  DATE: 08/24/22 Therapeutic Exercise: Pulleys flexion and scaption 1 min each S/L shoulder ER 2x10 S/L shoulder abd 2x10 Chest press protraction 2x10 Scaption wall slide x10 Manual Therapy: STM/DTM to the L upper  trap GH inf glide, distraction, AP grade lll mobs PROM for flexion, abd and ER     PATIENT EDUCATION: Education details: Eval findings, POC, HEP, self care Person educated: Patient Education method: Explanation, Demonstration, Tactile cues, Verbal cues, and Handouts Education comprehension: verbalized understanding, returned demonstration, verbal cues required, and tactile cues required   HOME EXERCISE PROGRAM: Access Code: [email or web address] URL: [email or web address] Date: 09/11/2022 Prepared by: Gar Ponto  Exercises - Supine Shoulder External Rotation with Dowel  - 1 x daily - 7 x weekly - 1 sets - 10 reps - 3 hold - Supine Shoulder Flexion Extension AAROM with Dowel  - 1 x daily - 7 x weekly - 1 sets - 10 reps - 3 hold - Standing Shoulder Row with Anchored Resistance  - 1 x daily - 7 x weekly - 3 sets - 10 reps - 2 hold - Shoulder Extension with Resistance  - 1 x daily - 7 x weekly - 3 sets - 10 reps - 2 hold - Standing Serratus Punch with Resistance  - 1 x daily - 7 x weekly - 3 sets - 10 reps - 2 hold - Shoulder External Rotation with Anchored Resistance (Mirrored)  - 1 x daily - 7 x weekly - 3 sets - 10 reps - Shoulder Internal Rotation with Resistance (Mirrored)  - 1 x daily - 7 x weekly - 3 sets - 10 reps - Shoulder Flexion Wall Slide with Towel  - 1 x daily - 7 x weekly - 1 sets - 10 reps - 5 hold - Shoulder Scaption Wall Slide with Towel  - 1 x daily - 7 x weekly - 1 sets - 10 reps - 5 hold  ASSESSMENT:   CLINICAL IMPRESSION: Pt was completed for peri-scapular and RC strengthening with a gradual progression for the RC strengthening. Pt's HEP was updated with a progression in the strengthening therex. Pt tolerated PT today without adverse effects. Pt will continue to benefit from skilled PT to address impairments for improved function of the L shoulder.  OBJECTIVE IMPAIRMENTS: decreased activity tolerance, decreased ROM, decreased strength, impaired UE functional use,  pain, and heavy arm .    ACTIVITY LIMITATIONS: carrying, lifting, sleeping, bed mobility, bathing, toileting, dressing, reach over head, and caring for others   PARTICIPATION LIMITATIONS: meal prep, cleaning, laundry, and driving   PERSONAL FACTORS: Age, Fitness, Past/current experiences, Social background, and 1 comorbidity: High BMI-heavy arm  are also affecting patient's functional outcome.    REHAB POTENTIAL: Good   CLINICAL DECISION MAKING: Evolving/moderate complexity   EVALUATION COMPLEXITY: Moderate     GOALS:   SHORT TERM GOALS: Target date: 07/27/22   Pt will be Ind in an initial HEP  Baseline: Goal status:07/26/22=MET   LONG TERM GOALS: Target date: 11/02/22   Pt will be Ind in a final HEP to maintain achieved LOF Baseline: initiated Goal status: Ongoing   2.  Improve L shoulder PROM to flexion 140, abd 120, ER 50  Baseline: see flow sheet Status: 08/01/22=see flow sheets. 08/15/22=see flow sheet. 08/24/22= see flow sheet  Goal status: MET   3.  Pt's FOTO score will improved to the predicted value of 58% as indication of improved function by DC anticpated to be after 09/14/22 Baseline: 32% Status:=50% Goal status: Improving   4.  Develop LTGs after 09/14/22 date when pt is able to particiapte in AROM and strengthening therex Baseline:  Goal status: MET 09/04/22  5.  Pt's will demonstrate  AROM of the L shoulder wihtin 90% of the R for god functional ROM  Baseline: See flow sheet Goal status: New as of 09/04/22  6. Pt will demonstrate 4+/5 L shoulder strength for all motions for appropriate function of the L shoulder Baseline: NT Goal status: New as of 09/04/22  7. Pt will be able to lift 8# to shoulder height x5 for strength with functional activities at home Baseline: NT Goal status: New as of 09/04/22    PLAN:   PT FREQUENCY: 2x/week   PT DURATION: 8 weeks   PLANNED INTERVENTIONS: Therapeutic exercises, Therapeutic activity, Patient/Family education, Self  Care, Joint mobilization, Aquatic Therapy, Dry Needling, Electrical stimulation, Cryotherapy, Moist heat, Taping, Vasopneumatic device, Ultrasound, Ionotophoresis 4mg /ml Dexamethasone, Manual therapy, and Re-evaluation   PLAN FOR NEXT SESSION: Review FOTO; assess response to HEP; progress therex as indicated; use of modalities, manual therapy; and TPDN as indicated.   Howard Patton MS, PT 09/11/22 1:58 PM

## 2022-09-14 ENCOUNTER — Ambulatory Visit: Payer: Medicare HMO

## 2022-09-14 DIAGNOSIS — M6281 Muscle weakness (generalized): Secondary | ICD-10-CM | POA: Diagnosis not present

## 2022-09-14 DIAGNOSIS — M25512 Pain in left shoulder: Secondary | ICD-10-CM

## 2022-09-14 DIAGNOSIS — M25612 Stiffness of left shoulder, not elsewhere classified: Secondary | ICD-10-CM | POA: Diagnosis not present

## 2022-09-14 DIAGNOSIS — M79604 Pain in right leg: Secondary | ICD-10-CM | POA: Diagnosis not present

## 2022-09-14 NOTE — Therapy (Signed)
OUTPATIENT PHYSICAL THERAPY TREATMENT NOTE   Patient Name: Rachel Woodard MRN: [id number] DOB:1950/08/07, 72 y.o., female Today's Date: 09/14/2022  PCP: Scheryl Marten, [place]   REFERRING PROVIDER: Sydnee Cabal, MD  See note below for Objective Data and Assessment of Progress/Goals.   END OF SESSION:   PT End of Session - 09/14/22 1018     Visit Number 15    Number of Visits 24    Date for PT Re-Evaluation 11/02/22    Authorization Type [employer] MEDICARE HMO;  [place] Time Period Approved 12 visits 09/04/22-10/20/22; 07/18/22-09/01/22 12    Authorization - Visit Number 55    Authorization - Number of Visits 24    Progress Note Due on Visit 20    PT Start Time 1019    PT Stop Time 1105    PT Time Calculation (min) 46 min    Activity Tolerance Patient tolerated treatment well    Behavior During Therapy WFL for tasks assessed/performed                     Past Medical History:  Diagnosis Date   Anemia 2001   after gastric bypass   Anxiety    Aortic atherosclerosis (HCC)    Arthritis    Back pain    Bilateral swelling of feet    Bipolar 1 disorder (HCC)    Breast cancer ([place]) 2013   Breast cancer    Radiation therapy   Calcified granuloma of lung (HCC)    Cigarette nicotine dependence    Cigarette nicotine dependence in remission    Cocaine dependence in remission ([place])    COPD (chronic obstructive pulmonary disease) (HCC)    Depression    Disequilibrium    Fibromyalgia    GERD (gastroesophageal reflux disease)    HSV infection    Hypertension    Hypertensive kidney disease with stage 3a chronic kidney disease ([place])    Hypertensive kidney disease with stage 3b chronic kidney disease ([place])    Osteoarthritis    Personal history of infectious disease    Personal history of radiation therapy 2013   Pneumonia    Pre-diabetes    PTSD (post-traumatic stress disorder)    Seasonal allergic rhinitis due to pollen     Sleep apnea    uses CPAP   SOB (shortness of breath)    Vitamin D deficiency    Past Surgical History:  Procedure Laterality Date   ABDOMINAL HYSTERECTOMY  2018   BREAST LUMPECTOMY Right 2013   GASTRIC BYPASS  2001   TOTAL KNEE ARTHROPLASTY Left 12/22/2018   Procedure: LEFT TOTAL KNEE ARTHROPLASTY;  Surgeon: Leandrew Koyanagi, MD;  Location: [facility];  Service: Orthopedics;  Laterality: Left;   Patient Active Problem List   Diagnosis Date Noted   Pain due to onychomycosis of toenails of both feet 01/20/2020   Shortness of breath 10/05/2019   Status post total left knee replacement 12/22/2018   MDD (major depressive disorder), recurrent episode, mild ([place]) 10/29/2018   Autoimmune hepatitis ([facility]) 10/29/2018   OSA (obstructive sleep apnea) 10/29/2018   Renal cyst, right 10/22/2018   Lung nodule, solitary 10/22/2018   Genital herpes simplex 10/29/2017   Vitamin D deficiency 10/29/2017   Morbid obesity ([place]) 10/29/2017   Essential hypertension 10/29/2017   Prediabetes 02/14/2017   Fibromyalgia 02/14/2017   Substance abuse in remission ([facility]) 02/14/2017   Anxiety and depression 02/14/2017   HX: breast cancer 02/14/2017  Urge incontinence 11/13/2016   Unilateral primary osteoarthritis, left knee 06/14/2016   GERD (gastroesophageal reflux disease) 11/02/2015   Angioedema 10/24/2013   Tobacco abuse 10/24/2013   DCIS (ductal carcinoma in situ) of breast 04/07/2013   Depression 04/07/2013    REFERRING DIAG: ICD-10:Z47.89: Encounter for other orthopedic aftercare. Left shoulder scope SAD/SCR/RCR  THERAPY DIAG:  Acute pain of left shoulder  Stiffness of left shoulder, not elsewhere classified  Muscle weakness (generalized)  Rationale for Evaluation and Treatment Rehabilitation  PERTINENT HISTORY: High BMI, Arthritis, anxiety, COPD  PRECAUTIONS: Shoulder  07/03/22 Surgery   SUBJECTIVE:                                                                                                                                                                                     SUBJECTIVE STATEMENT: Pt reports increased L shoulder sore with increase activity at home.  PAIN:  Are you having pain? Yes: NPRS scale: 4/10 Pain location: L shoulder Pain description: ache, constant Aggravating factors: Dressing Relieving factors: Rest, hot shower, medication   OBJECTIVE: (objective measures completed at initial evaluation unless otherwise dated)   DIAGNOSTIC FINDINGS:  None recent for L shoulder in Epic   PATIENT SURVEYS:  FOTO: Perceived function   32%, predicted   58%; 08/22/22= 50%    COGNITION: Overall cognitive status: Within functional limits for tasks assessed                                  SENSATION: WFL   POSTURE: Forward head c rounded shoulders   UPPER EXTREMITY ROM:    Passive ROM Right eval Left eval Left 07/19/22 07/25/22 LT LT 07/26/22 LT 08/01/22 LT 08/15/22 LT 08/24/22 Lt 09/04/22  Shoulder flexion A140 P60 P80 P105 P115 P125 AA150 AA155 A100  Shoulder extension             Shoulder abduction A140 P60 P80 P80  100P  AA130   Shoulder adduction             Shoulder internal rotation             Shoulder external rotation A70 P40 P40 P50  P60 AA80 AA80   Elbow flexion             Elbow extension             Wrist flexion             Wrist extension             Wrist ulnar deviation             Wrist  radial deviation             Wrist pronation             Wrist supination             (Blank rows = not tested)  AROM flex 09/06/22= 112d 09/14/22=130d  UPPER EXTREMITY MMT:            NT MMT Right eval Left eval  Shoulder flexion      Shoulder extension      Shoulder abduction      Shoulder adduction      Shoulder internal rotation      Shoulder external rotation      Middle trapezius      Lower trapezius      Elbow flexion      Elbow extension      Wrist flexion      Wrist extension      Wrist ulnar deviation      Wrist radial deviation       Wrist pronation      Wrist supination      Grip strength (lbs)      (Blank rows = not tested)   SHOULDER SPECIAL TESTS: Impingement tests:  NT SLAP lesions:  NT Instability tests:  NT Rotator cuff assessment:  NT Biceps assessment:  NT   JOINT MOBILITY TESTING:  NT   PALPATION:  TTP of the L peri-shoulder         TODAY'S TREATMENT: [facility] Adult PT Treatment:                                                DATE: 09/14/22 Therapeutic Exercise: UBE L1 32min 2 mins FWD, 2 mins BWD Shoulder flexion swiss ball wall rolls L shoulder wall circles c small ball S/L shoulder ER 3x10 2# S/L shoulder abd 3x10 2# Shoulder flexion to 90d 2x10 no wt Modalities: Vaso 15 mins, 34d, mod pressure  [facility] Adult PT Treatment:                                                DATE: 09/11/22 Therapeutic Exercise: UBE L1 30min 2 mins FWD, 2 mins BWD Pulleys flexion and scaption 1 min each AAROM for flexion and scaption c wand x10 each Standing shoulder row GTB 2x15 Standing shoulder ext GTB 2x15 Standing shoulder ER YTB 2x15 Standing shoulder IR RTB2x15 HEP updated  [facility] Adult PT Treatment:                                                DATE: 09/06/22 Therapeutic Exercise: Pulleys flexion and scaption 1 min each Shoulder ranger for flexion and scaption x15 each Shoulder flexion c outward press c pillow case on wall 2x10 Shoulder isometric flexion, abd, ER, IR, ext x10 5" Standing shoulder row GTB2x15 Standing shoulder ext GTB2x15 Supine shoulder flexion 2x10 Modalities: CP to L shoulder x10 mins  PATIENT EDUCATION: Education details: Eval findings, POC, HEP, self care Person educated: Patient Education method: Explanation, Demonstration, Tactile cues, Verbal cues, and Handouts Education comprehension: verbalized understanding, returned demonstration,  verbal cues required, and tactile cues required   HOME EXERCISE PROGRAM: Access Code: [email or web address] URL: [email or web address] Date:  09/11/2022 Prepared by: Gar Ponto  Exercises - Supine Shoulder External Rotation with Dowel  - 1 x daily - 7 x weekly - 1 sets - 10 reps - 3 hold - Supine Shoulder Flexion Extension AAROM with Dowel  - 1 x daily - 7 x weekly - 1 sets - 10 reps - 3 hold - Standing Shoulder Row with Anchored Resistance  - 1 x daily - 7 x weekly - 3 sets - 10 reps - 2 hold - Shoulder Extension with Resistance  - 1 x daily - 7 x weekly - 3 sets - 10 reps - 2 hold - Standing Serratus Punch with Resistance  - 1 x daily - 7 x weekly - 3 sets - 10 reps - 2 hold - Shoulder External Rotation with Anchored Resistance (Mirrored)  - 1 x daily - 7 x weekly - 3 sets - 10 reps - Shoulder Internal Rotation with Resistance (Mirrored)  - 1 x daily - 7 x weekly - 3 sets - 10 reps - Shoulder Flexion Wall Slide with Towel  - 1 x daily - 7 x weekly - 1 sets - 10 reps - 5 hold - Shoulder Scaption Wall Slide with Towel  - 1 x daily - 7 x weekly - 1 sets - 10 reps - 5 hold  ASSESSMENT:   CLINICAL IMPRESSION: Therex was progressed for L shoulder periscapular and RC strengthening. Pt tolerated the progressions in resistance without increase in L shoulder soreness. Pt is able to now actively flex her L shoulder to 130d, increased from 112d last week. Vaso was completed at the session for symptom management with progression of strengthening therex. Pt will continue to benefit from skilled PT to address impairments for improved function of the L shoulder/UE  OBJECTIVE IMPAIRMENTS: decreased activity tolerance, decreased ROM, decreased strength, impaired UE functional use, pain, and heavy arm .    ACTIVITY LIMITATIONS: carrying, lifting, sleeping, bed mobility, bathing, toileting, dressing, reach over head, and caring for others   PARTICIPATION LIMITATIONS: meal prep, cleaning, laundry, and driving   PERSONAL FACTORS: Age, Fitness, Past/current experiences, Social background, and 1 comorbidity: High BMI-heavy arm  are also affecting  patient's functional outcome.    REHAB POTENTIAL: Good   CLINICAL DECISION MAKING: Evolving/moderate complexity   EVALUATION COMPLEXITY: Moderate     GOALS:   SHORT TERM GOALS: Target date: 07/27/22   Pt will be Ind in an initial HEP  Baseline: Goal status:07/26/22=MET   LONG TERM GOALS: Target date: 11/02/22   Pt will be Ind in a final HEP to maintain achieved LOF Baseline: initiated Goal status: Ongoing   2.  Improve L shoulder PROM to flexion 140, abd 120, ER 50  Baseline: see flow sheet Status: 08/01/22=see flow sheets. 08/15/22=see flow sheet. 08/24/22= see flow sheet  Goal status: MET   3.  Pt's FOTO score will improved to the predicted value of 58% as indication of improved function by DC anticpated to be after 09/14/22 Baseline: 32% Status:=50% Goal status: Improving   4.  Develop LTGs after 09/14/22 date when pt is able to particiapte in AROM and strengthening therex Baseline:  Goal status: MET 09/04/22  5.  Pt's will demonstrate  AROM of the L shoulder wihtin 90% of the R for god functional ROM  Baseline: See flow sheet Goal status: New as of 09/04/22  6. Pt will demonstrate 4+/5 L shoulder  strength for all motions for appropriate function of the L shoulder Baseline: NT Goal status: New as of 09/04/22  7. Pt will be able to lift 8# to shoulder height x5 for strength with functional activities at home Baseline: NT Goal status: New as of 09/04/22    PLAN:   PT FREQUENCY: 2x/week   PT DURATION: 8 weeks   PLANNED INTERVENTIONS: Therapeutic exercises, Therapeutic activity, Patient/Family education, Self Care, Joint mobilization, Aquatic Therapy, Dry Needling, Electrical stimulation, Cryotherapy, Moist heat, Taping, Vasopneumatic device, Ultrasound, Ionotophoresis 4mg /ml Dexamethasone, Manual therapy, and Re-evaluation   PLAN FOR NEXT SESSION: Review FOTO; assess response to HEP; progress therex as indicated; use of modalities, manual therapy; and TPDN as indicated.    Abigale Dorow MS, PT 09/14/22 11:26 AM

## 2022-09-18 ENCOUNTER — Encounter: Payer: Self-pay | Admitting: Nurse Practitioner

## 2022-09-18 ENCOUNTER — Ambulatory Visit (INDEPENDENT_AMBULATORY_CARE_PROVIDER_SITE_OTHER): Payer: Medicare HMO | Admitting: Nurse Practitioner

## 2022-09-18 VITALS — BP 124/76 | HR 69 | Temp 98.0°F | Ht 66.0 in | Wt 251.0 lb

## 2022-09-18 DIAGNOSIS — Z6841 Body Mass Index (BMI) 40.0 and over, adult: Secondary | ICD-10-CM | POA: Diagnosis not present

## 2022-09-18 DIAGNOSIS — R600 Localized edema: Secondary | ICD-10-CM | POA: Diagnosis not present

## 2022-09-18 DIAGNOSIS — I10 Essential (primary) hypertension: Secondary | ICD-10-CM

## 2022-09-18 DIAGNOSIS — N1831 Chronic kidney disease, stage 3a: Secondary | ICD-10-CM | POA: Insufficient documentation

## 2022-09-18 NOTE — Patient Instructions (Signed)
Steps to starting your WegovyT  The office staff will send a prior authorization request to your insurance company for approval. We will send you a mychart message once we hear back from your insurance with a decision.  This can take up to 7-10 business days.   Once your WegovyTis approved, you may then pick up Wegovy pen from your pharmacy.    Learn how to do Wegovy injections on the Wegovy.com website. There is a training video that will walk you through how to safely perform the injection. If you have questions for our clinical staff, please contact our  clinical staff. If you have any symptoms of allergic reaction to WegovyT discontinue immediately and call 911.  1. What should I tell my provider before using WegovyT ? have or have had problems with your pancreas or kidneys. have type 2 diabetes and a history of diabetic retinopathy. have or have had depression, suicidal thoughts, or mental health issues. are pregnant or plan to become pregnant. WegovyT may harm your unborn baby. You should stop using WegovyT 3 months before you plan to become pregnant or if you are breastfeeding or plan to breastfeed. It is not known if WegovyT passes into your breast milk.  2. What is WegovyT and how does it work?  WegovyT is an injectable prescription medication prescribed by your provider to help with your weight loss.  This medicine will be most effective when combined with a reduced calorie diet and physical activity.  WegovyT is not for the treatment of type 2 diabetes mellitus. WegovyT should not be used with other GLP-1 receptor agonist medicines. The addition of WegovyT in  patients treated with insulin has not been evaluated. When initiating WegovyT, consider reducing the dose of concomitantly administered insulin secretagogues (such as sulfonylureas) or insulin to reduce the risk of  hypoglycemia.  One role of GLP-1 is to send a signal to your brain to tell it you are full. It also slows down  stomach emptying which will make you feel full longer and may help with reducing cravings.   3.  How should I take WegovyT?  Administer WegovyT once weekly, on the same day each week, at any time of day, with or without meals Inject subcutaneously in the abdomen, thigh or upper arm Initiate at 0.25 mg once weekly for 4 weeks. In 4 week intervals, increase the dose until a dose of 2.4 mg is reached (we will discuss with you the dosage at each visit). The maintenance dose of WegovyT is 2.4 mg once weekly.  The dosing schedule of Wegovy is:  0.25 mg per week X 4 weeks 0.5 mg per week X 4 weeks 1.0 mg per week X 4 weeks 1.7 mg per week X 4 weeks 2.4 mg per week   Missed dose   If you miss your injection day, go ahead inject your current dose. You can go >7 days, but not <7 days between injections. You may change your injection day (It must be >7 days). If you miss >2 doses, you can still keep next injection dose the same or follow de-escalation schedule which may minimize GI symptoms.   In patients with type 2 diabetes, monitor blood glucose prior to starting and during WEGOVYT treatment.   Inject your dose of Wegovy under the skin (subcutaneous injection) in your stomach area (abdomen), upper leg (thigh) or upper arm. Do not inject into a vein or a muscle. The injection site should be rotated and not given in the   same spot each day. Hold the needle under the skin and count to "10". This will allow all of the medicine to be dispensed under the skin. Always wipe your skin with an alcohol prep pad before injection  Dispose of used pen in an approved sharps container. More practical options that can be put in the trash  to go to the landfill are milk jugs or plastic laundry detergent containers with a screw on lid.  What side effects may I notice from taking WegovyT?  Side effects that usually do not require medical attention (report to our office if they continue or are bothersome): Nausea  (most common but decreases over time in most people as their body gets used to the medicine) Diarrhea Constipation (you may take an over the counter laxative if needed) Headache Decreased appetite Upset stomach Tiredness Dizziness Feeling bloated Hair loss Belching Gas Heartburn  Side effects that you should call 911 as soon as possible Vomiting Stomach pain Fever Yellowing of your skin or eyes  Clay-colored stools Increased heart rate while at rest Low blood sugar  Sudden changes in mood, behaviors, thoughts, feelings, or thoughts of suicide If you get a lump or swelling in your neck, hoarseness, trouble swallowing, or shortness of breath. Allergic reaction such as skin rash, itching, hives, swelling of the face, tongue, or lips  Helpful tips for managing nausea Nausea is a common side effect when first starting WegovyT. If you experience nausea, be sure to connect with your health care provider. He or she will offer guidance on ways to manage it, which may include: Eat bland, low-fat foods, like crackers, toast and rice  Eat foods that contain water, like soups and gelatin  Avoid lying down after you eat  Go outdoors for fresh air  Eat more slowly    Other important information Do not drop your pen or knock it against hard surfaces  Do not expose your pen to any liquids  If you think that your pen may be damaged, do not try to fix it. Use a new one Keep the pen cap on until you are ready to inject. Your pen will no longer be sterile if you store an unused pen without the cap, if you pull the pen cap off and put it on again, or if the pen cap is missing. This could lead to an infection  Store the WegovyT pen in the refrigerator from 36F to 46F (2C to 8C) If needed, before removing the pen cap, WegovyT can be stored from 8C to 30C (46F to 86F) in the original carton for up to 28 days.  Keep WegovyT in the original carton to protect it from light  Do not freeze   Throw away pen if WegovyT has been frozen, has been exposed to light or temperatures above 86F (30C), or has been out of the refrigerator for 28 days or longer It's important to properly dispose of your used WegovyT pens. Do not throw the pen away in your household trash. Instead, use an FDA-cleared sharps disposable container or a sturdy household container with a tight-fitting lid, like a heavy duty plastic container.   Wegovy pen training website: https://www.wegovy.com/about-wegovy/how-to-use-the-wegovy-pen.html  Wegovy savings and support link: https://www.wegovy.com/saving-and-support/save-and-support.html    

## 2022-09-18 NOTE — Progress Notes (Signed)
Office: [phone]  /  Fax: [phone]  WEIGHT SUMMARY AND BIOMETRICS  No data recorded Weight Gained Since Last Visit: 5lb   Vitals Temp: 98 F (36.7 C) BP: 124/76 Pulse Rate: 69 SpO2: 95 %   Anthropometric Measurements Height: 5\' 6"  (1.676 m) Weight: 251 lb (113.9 kg) BMI (Calculated): 40.53 Weight at Last Visit: 246lb Weight Gained Since Last Visit: 5lb Starting Weight: 258lb Total Weight Loss (lbs): 7 lb (3.175 kg)   Body Composition  Body Fat %: 50 % Fat Mass (lbs): 125.8 lbs Muscle Mass (lbs): 119.4 lbs Total Body Water (lbs): 97.2 lbs Visceral Fat Rating : 17   Other Clinical Data Fasting: No Today's Visit #: 22 Starting Date: 03/14/21     HPI  Chief Complaint: OBESITY  Rachel Woodard is here to discuss her progress with her obesity treatment plan. She is on the the Category 3 Plan and states she is following her eating plan approximately 85 % of the time. She states she is exercising 45 minutes 1 days per week.  She is going to PT 2 days per week.    Interval History:  Since last office visit she has gained 5 pounds.  BF:  coffee, protein shake and fruit. Snack:  2 waffles with peanut butter, 2 eggs, orange, unsweetened green tea.  Lunch:  sweet & sour chicken.  Snack:  special K crisp.  Dinner:  1/2 baked potato, vegetable, protein.  She has "cut back on my bread".     Pharmacotherapy for weight loss: She is not currently taking medications  for medical weight loss.    Previous pharmacotherapy for medical weight loss:  She has never taken medications in the past for medical weight loss.  She has tried OTC weight loss meds.   Bariatric surgery:  Patient is status post gastric bypass by in [place] in 2001.  Her highest weight prior to surgery was 286 lbs and Her nadir weight after surgery was 186lbs.  Taking Vit D, Calcium, Vit B12.  She is not taking a multivitamin or iron.    Hypertension Hypertension BP looks better today.  Was elevated at her  last visit.  Medication(s):  She is taking Coreg 3.125 BID and HCTZ 25mg .  Denies side effects.  Denies chest pain, palpitations and SOB.  Notes some swelling in right leg.  Tends to have edema in her right leg.  Reports discussing with PCP and has had an [place].  BP Readings from Last 3 Encounters:  09/18/22 124/76  08/14/22 (!) 156/79  07/17/22 110/69   Lab Results  Component Value Date   CREATININE 1.18 (H) 06/13/2022   CREATININE 1.15 (H) 03/14/2021   CREATININE 0.94 11/15/2020    PHYSICAL EXAM:  Blood pressure 124/76, pulse 69, temperature 98 F (36.7 C), height 5\' 6"  (1.676 m), weight 251 lb (113.9 kg), SpO2 95 %. Body mass index is 40.51 kg/m.  General: She is overweight, cooperative, alert, well developed, and in no acute distress. PSYCH: Has normal mood, affect and thought process.   Extremities: No edema.  Neurologic: No gross sensory or motor deficits. No tremors or fasciculations noted.    DIAGNOSTIC DATA REVIEWED:  BMET    Component Value Date/Time   NA 138 06/13/2022 1225   K 4.6 06/13/2022 1225   CL 99 06/13/2022 1225   CO2 25 06/13/2022 1225   GLUCOSE 100 (H) 06/13/2022 1225   GLUCOSE 115 (H) 12/23/2018 0521   BUN 27 06/13/2022 1225   CREATININE 1.18 (H) 06/13/2022 1225  CREATININE 0.95 08/08/2016 1102   CALCIUM 9.1 06/13/2022 1225   GFRNONAA 41 (L) 12/23/2018 0521   GFRAA 47 (L) 12/23/2018 0521   Lab Results  Component Value Date   HGBA1C 6.0 (H) 06/13/2022   HGBA1C 5.7 08/08/2016   Lab Results  Component Value Date   INSULIN 14.4 03/14/2021   Lab Results  Component Value Date   TSH 3.320 03/14/2021   CBC    Component Value Date/Time   WBC 7.6 03/14/2021 0844   WBC 8.7 12/23/2018 0521   RBC 4.63 03/14/2021 0844   RBC 4.34 12/23/2018 0521   HGB 13.3 03/14/2021 0844   HCT 40.5 03/14/2021 0844   PLT 255 03/14/2021 0844   MCV 88 03/14/2021 0844   MCH 28.7 03/14/2021 0844   MCH 27.9 12/23/2018 0521   MCHC 32.8 03/14/2021 0844   MCHC  31.9 12/23/2018 0521   RDW 14.5 03/14/2021 0844   Iron Studies    Component Value Date/Time   FERRITIN 17 06/13/2022 1225   Lipid Panel     Component Value Date/Time   CHOL 170 03/14/2021 0844   TRIG 99 03/14/2021 0844   HDL 53 03/14/2021 0844   CHOLHDL 3.1 07/11/2017 1107   CHOLHDL 3.0 08/08/2016 1102   VLDL 17 08/08/2016 1102   LDLCALC 99 03/14/2021 0844   Hepatic Function Panel     Component Value Date/Time   PROT 6.9 06/13/2022 1225   ALBUMIN 4.1 06/13/2022 1225   AST 13 06/13/2022 1225   ALT 17 06/13/2022 1225   ALKPHOS 114 06/13/2022 1225   BILITOT 0.2 06/13/2022 1225   BILIDIR 0.1 08/08/2016 1102   IBILI 0.3 08/08/2016 1102      Component Value Date/Time   TSH 3.320 03/14/2021 0844   Nutritional Lab Results  Component Value Date   VD25OH 36.9 06/13/2022   VD25OH 33.0 03/14/2021   VD25OH 27.7 (L) 04/24/2018     ASSESSMENT AND PLAN  TREATMENT PLAN FOR OBESITY:  Recommended Dietary Goals  Rachel Woodard is currently in the action stage of change. As such, her goal is to continue weight management plan. She has agreed to the Category 3 Plan.  Behavioral Intervention  We discussed the following Behavioral Modification Strategies today: increasing lean protein intake, decreasing simple carbohydrates , increasing vegetables, increasing fiber rich foods, avoiding skipping meals, increasing water intake, work on meal planning and easy cooking plans, planning for success, and keeping healthy foods at home.  Additional resources provided today: NA  Recommended Physical Activity Goals  Rachel Woodard has been advised to work up to 150 minutes of moderate intensity aerobic activity a week and strengthening exercises 2-3 times per week for cardiovascular health, weight loss maintenance and preservation of muscle mass.   She has agreed to Continue current level of physical activity    Pharmacotherapy We discussed various medication options to help Rachel Woodard with her weight  loss efforts and we both agreed that the patient would be a good candidate for Woodard due to HTN, OSAS, history of heart murmur, pre diabetes.  ASSOCIATED CONDITIONS ADDRESSED TODAY  Action/Plan  Essential hypertension BP looks better.  To follow up with PCP. Continue meds as directed  Lower extremity edema See note below  Morbid obesity ([place])  BMI 40.0-44.9, adult [facility])     Discussed with pt that she needs to discuss LEE with PCP and needs to follow with PCP to discuss further testing from last CT recommendations from 06/11/22. Discussed recommendations with patient.  She plans to discuss follow up  with PCP.  Her mammogram is scheduled May 3rd.    Return in about 4 weeks (around 10/16/2022).Marland Kitchen She was informed of the importance of frequent follow up visits to maximize her success with intensive lifestyle modifications for her multiple health conditions.   ATTESTASTION STATEMENTS:  Reviewed by clinician on day of visit: allergies, medications, problem list, medical history, surgical history, family history, social history, and previous encounter notes.   Time spent on visit including pre-visit chart review and post-visit care and charting was 30 minutes.    Ailene Rud. Yasuko Lapage FNP-C

## 2022-09-20 DIAGNOSIS — F339 Major depressive disorder, recurrent, unspecified: Secondary | ICD-10-CM | POA: Diagnosis not present

## 2022-09-25 ENCOUNTER — Ambulatory Visit: Payer: Medicare HMO | Attending: Specialist

## 2022-09-25 DIAGNOSIS — M25612 Stiffness of left shoulder, not elsewhere classified: Secondary | ICD-10-CM

## 2022-09-25 DIAGNOSIS — M25512 Pain in left shoulder: Secondary | ICD-10-CM

## 2022-09-25 DIAGNOSIS — M6281 Muscle weakness (generalized): Secondary | ICD-10-CM | POA: Diagnosis not present

## 2022-09-25 DIAGNOSIS — M79604 Pain in right leg: Secondary | ICD-10-CM | POA: Diagnosis not present

## 2022-09-25 NOTE — Therapy (Signed)
OUTPATIENT PHYSICAL THERAPY TREATMENT NOTE   Patient Name: Rachel Woodard MRN: [id number] DOB:06-29-1950, 72 y.o., female Today's Date: 09/25/2022  PCP: Scheryl Marten, [place]   REFERRING PROVIDER: Sydnee Cabal, MD  See note below for Objective Data and Assessment of Progress/Goals.   END OF SESSION:   PT End of Session - 09/25/22 1208     Visit Number 16    Number of Visits 24    Date for PT Re-Evaluation 11/02/22    Authorization Time Period Approved 12 visits 09/04/22-10/20/22; 07/18/22-09/01/22 12    Authorization - Visit Number 50    Authorization - Number of Visits 24    Progress Note Due on Visit 33    PT Start Time 1150    PT Stop Time 1230    PT Time Calculation (min) 40 min    Equipment Utilized During Treatment Cervical collar    Activity Tolerance Patient tolerated treatment well    Behavior During Therapy WFL for tasks assessed/performed                     Past Medical History:  Diagnosis Date   Anemia 2001   after gastric bypass   Anxiety    Aortic atherosclerosis    Arthritis    Back pain    Bilateral swelling of feet    Bipolar 1 disorder    Breast cancer 2013   Breast cancer    Radiation therapy   Calcified granuloma of lung    Cigarette nicotine dependence    Cigarette nicotine dependence in remission    Cocaine dependence in remission    COPD (chronic obstructive pulmonary disease)    Depression    Disequilibrium    Fibromyalgia    GERD (gastroesophageal reflux disease)    HSV infection    Hypertension    Hypertensive kidney disease with stage 3a chronic kidney disease    Hypertensive kidney disease with stage 3b chronic kidney disease    Osteoarthritis    Personal history of infectious disease    Personal history of radiation therapy 2013   Pneumonia    Pre-diabetes    PTSD (post-traumatic stress disorder)    Seasonal allergic rhinitis due to pollen    Sleep apnea    uses CPAP   SOB (shortness of breath)     Vitamin D deficiency    Past Surgical History:  Procedure Laterality Date   ABDOMINAL HYSTERECTOMY  2018   BREAST LUMPECTOMY Right 2013   GASTRIC BYPASS  2001   TOTAL KNEE ARTHROPLASTY Left 12/22/2018   Procedure: LEFT TOTAL KNEE ARTHROPLASTY;  Surgeon: Leandrew Koyanagi, MD;  Location: [facility];  Service: Orthopedics;  Laterality: Left;   Patient Active Problem List   Diagnosis Date Noted   Chronic kidney disease, stage 3a 09/18/2022   Pain due to onychomycosis of toenails of both feet 01/20/2020   Shortness of breath 10/05/2019   Status post total left knee replacement 12/22/2018   MDD (major depressive disorder), recurrent episode, mild 10/29/2018   Autoimmune hepatitis 10/29/2018   OSA (obstructive sleep apnea) 10/29/2018   Renal cyst, right 10/22/2018   Lung nodule, solitary 10/22/2018   Genital herpes simplex 10/29/2017   Vitamin D deficiency 10/29/2017   Morbid obesity 10/29/2017   Essential hypertension 10/29/2017   Prediabetes 02/14/2017   Fibromyalgia 02/14/2017   Substance abuse in remission 02/14/2017   Anxiety and depression 02/14/2017   HX: breast cancer 02/14/2017   Urge incontinence 11/13/2016   Unilateral primary  osteoarthritis, left knee 06/14/2016   Hx of breast cancer 04/02/2016   GERD (gastroesophageal reflux disease) 11/02/2015   Angioedema 10/24/2013   Tobacco abuse 10/24/2013   DCIS (ductal carcinoma in situ) of breast 04/07/2013   Depression 04/07/2013    REFERRING DIAG: ICD-10:Z47.89: Encounter for other orthopedic aftercare. Left shoulder scope SAD/SCR/RCR  THERAPY DIAG:  Acute pain of left shoulder  Stiffness of left shoulder, not elsewhere classified  Muscle weakness (generalized)  Rationale for Evaluation and Treatment Rehabilitation  PERTINENT HISTORY: High BMI, Arthritis, anxiety, COPD  PRECAUTIONS: Shoulder  07/03/22 Surgery   SUBJECTIVE:                                                                                                                                                                                     SUBJECTIVE STATEMENT: Pt reports being pleased with her L UE shoulder  mobility and the use of her L arm at home.  PAIN:  Are you having pain? Yes: NPRS scale: 1/10 Pain location: L shoulder Pain description: ache, constant Aggravating factors: Dressing Relieving factors: Rest, hot shower, medication   OBJECTIVE: (objective measures completed at initial evaluation unless otherwise dated)   DIAGNOSTIC FINDINGS:  None recent for L shoulder in Epic   PATIENT SURVEYS:  FOTO: Perceived function   32%, predicted   58%; 08/22/22= 50%    COGNITION: Overall cognitive status: Within functional limits for tasks assessed                                  SENSATION: WFL   POSTURE: Forward head c rounded shoulders   UPPER EXTREMITY ROM:    Passive ROM Right eval Left eval Left 07/19/22 07/25/22 LT LT 07/26/22 LT 08/01/22 LT 08/15/22 LT 08/24/22 Lt 09/04/22  Shoulder flexion A140 P60 P80 P105 P115 P125 AA150 AA155 A100  Shoulder extension             Shoulder abduction A140 P60 P80 P80  100P  AA130   Shoulder adduction             Shoulder internal rotation             Shoulder external rotation A70 P40 P40 P50  P60 AA80 AA80   Elbow flexion             Elbow extension             Wrist flexion             Wrist extension             Wrist ulnar deviation  Wrist radial deviation             Wrist pronation             Wrist supination             (Blank rows = not tested)  AROM flex 09/06/22= 112d 09/14/22=130d  UPPER EXTREMITY MMT:            NT MMT Right eval Left eval  Shoulder flexion      Shoulder extension      Shoulder abduction      Shoulder adduction      Shoulder internal rotation      Shoulder external rotation      Middle trapezius      Lower trapezius      Elbow flexion      Elbow extension      Wrist flexion      Wrist extension      Wrist ulnar deviation      Wrist  radial deviation      Wrist pronation      Wrist supination      Grip strength (lbs)      (Blank rows = not tested)   SHOULDER SPECIAL TESTS: Impingement tests:  NT SLAP lesions:  NT Instability tests:  NT Rotator cuff assessment:  NT Biceps assessment:  NT   JOINT MOBILITY TESTING:  NT   PALPATION:  TTP of the L peri-shoulder         TODAY'S TREATMENT: [facility] Adult PT Treatment:                                                DATE: 09/25/22 Therapeutic Exercise: UBE L1 10min 2 mins FWD, 2 mins BWD 45d incline L shoulder flexion 3x10; 0,1, 2# 10 each S/L shoulder ER 2x10 3# S/L shoulder abd 3x10 3# Lifting to chest height 2#, shoulder height 1# x10 each Wall serratus press 3x10 Standing shoulder row GTB 3x10 Standing shoulder ext GTB 3x10  [facility] Adult PT Treatment:                                                DATE: 09/14/22 Therapeutic Exercise: UBE L1 32min 2 mins FWD, 2 mins BWD Shoulder flexion swiss ball wall rolls L shoulder wall circles c small ball S/L shoulder ER 3x10 2# S/L shoulder abd 3x10 2# Shoulder flexion to 90d 2x10 no wt Modalities: Vaso 15 mins, 34d, mod pressure  [facility] Adult PT Treatment:                                                DATE: 09/11/22 Therapeutic Exercise: UBE L1 28min 2 mins FWD, 2 mins BWD Pulleys flexion and scaption 1 min each AAROM for flexion and scaption c wand x10 each Standing shoulder row GTB 2x15 Standing shoulder ext GTB 2x15 Standing shoulder ER YTB 2x15 Standing shoulder IR RTB2x15 HEP updated  PATIENT EDUCATION: Education details: Eval findings, POC, HEP, self care Person educated: Patient Education method: Explanation, Demonstration, Tactile cues, Verbal cues, and Handouts Education comprehension: verbalized understanding, returned demonstration,  verbal cues required, and tactile cues required   HOME EXERCISE PROGRAM: Access Code: [email or web address] URL: [email or web address] Date: 09/11/2022 Prepared by: Gar Ponto  Exercises - Supine Shoulder External Rotation with Dowel  - 1 x daily - 7 x weekly - 1 sets - 10 reps - 3 hold - Supine Shoulder Flexion Extension AAROM with Dowel  - 1 x daily - 7 x weekly - 1 sets - 10 reps - 3 hold - Standing Shoulder Row with Anchored Resistance  - 1 x daily - 7 x weekly - 3 sets - 10 reps - 2 hold - Shoulder Extension with Resistance  - 1 x daily - 7 x weekly - 3 sets - 10 reps - 2 hold - Standing Serratus Punch with Resistance  - 1 x daily - 7 x weekly - 3 sets - 10 reps - 2 hold - Shoulder External Rotation with Anchored Resistance (Mirrored)  - 1 x daily - 7 x weekly - 3 sets - 10 reps - Shoulder Internal Rotation with Resistance (Mirrored)  - 1 x daily - 7 x weekly - 3 sets - 10 reps - Shoulder Flexion Wall Slide with Towel  - 1 x daily - 7 x weekl y - 1 sets - 10 reps - 5 hold - Shoulder Scaption Wall Slide with Towel  - 1 x daily - 7 x weekly - 1 sets - 10 reps - 5 hold  ASSESSMENT:   CLINICAL IMPRESSION: PT was completed for L shoulder periscapular and RC strengthening. Gradual increases in resistance for the L RC were completed and tolerated well. Pt notes good use of her L shoulder with her daily activities at home. Pt will continue to benefit from skilled PT to address impairments for improved function of the L shoulder/UE. Reassess FOTO the next PT session.  OBJECTIVE IMPAIRMENTS: decreased activity tolerance, decreased ROM, decreased strength, impaired UE functional use, pain, and heavy arm .    ACTIVITY LIMITATIONS: carrying, lifting, sleeping, bed mobility, bathing, toileting, dressing, reach over head, and caring for others   PARTICIPATION LIMITATIONS: meal prep, cleaning, laundry, and driving   PERSONAL FACTORS: Age, Fitness, Past/current experiences, Social background, and 1 comorbidity: High BMI-heavy arm  are also affecting patient's functional outcome.    REHAB POTENTIAL: Good   CLINICAL DECISION MAKING: Evolving/moderate complexity    EVALUATION COMPLEXITY: Moderate     GOALS:   SHORT TERM GOALS: Target date: 07/27/22   Pt will be Ind in an initial HEP  Baseline: Goal status:07/26/22=MET   LONG TERM GOALS: Target date: 11/02/22   Pt will be Ind in a final HEP to maintain achieved LOF Baseline: initiated Goal status: Ongoing   2.  Improve L shoulder PROM to flexion 140, abd 120, ER 50  Baseline: see flow sheet Status: 08/01/22=see flow sheets. 08/15/22=see flow sheet. 08/24/22= see flow sheet  Goal status: MET   3.  Pt's FOTO score will improved to the predicted value of 58% as indication of improved function by DC anticpated to be after 09/14/22 Baseline: 32% Status:=50% Goal status: Improving   4.  Develop LTGs after 09/14/22 date when pt is able to particiapte in AROM and strengthening therex Baseline:  Goal status: MET 09/04/22  5.  Pt's will demonstrate  AROM of the L shoulder wihtin 90% of the R for god functional ROM  Baseline: See flow sheet Goal status: New as of 09/04/22  6. Pt will demonstrate 4+/5 L shoulder strength for all motions for appropriate function of the  L shoulder Baseline: NT Goal status: New as of 09/04/22  7. Pt will be able to lift 8# to shoulder height x5 for strength with functional activities at home Baseline: NT Goal status: New as of 09/04/22    PLAN:   PT FREQUENCY: 2x/week   PT DURATION: 8 weeks   PLANNED INTERVENTIONS: Therapeutic exercises, Therapeutic activity, Patient/Family education, Self Care, Joint mobilization, Aquatic Therapy, Dry Needling, Electrical stimulation, Cryotherapy, Moist heat, Taping, Vasopneumatic device, Ultrasound, Ionotophoresis 4mg /ml Dexamethasone, Manual therapy, and Re-evaluation   PLAN FOR NEXT SESSION: Review FOTO; assess response to HEP; progress therex as indicated; use of modalities, manual therapy; and TPDN as indicated.   Raseel Jans MS, PT 09/25/22 1:06 PM

## 2022-09-26 NOTE — Therapy (Signed)
OUTPATIENT PHYSICAL THERAPY TREATMENT NOTE   Patient Name: Rachel Woodard MRN: [id number] DOB:07/21/1950, 72 y.o., female Today's Date: 09/27/2022  PCP: Scheryl Marten, [place]   REFERRING PROVIDER: Sydnee Cabal, MD  See note below for Objective Data and Assessment of Progress/Goals.   END OF SESSION:   PT End of Session - 09/27/22 0853     Visit Number 17    Number of Visits 24    Date for PT Re-Evaluation 11/02/22    Authorization Type [employer] MEDICARE HMO;  [place] Time Period Approved 12 visits 09/04/22-10/20/22; 07/18/22-09/01/22 12    Authorization - Visit Number [id number]    Authorization - Number of Visits 24    Progress Note Due on Visit 76    PT Start Time 0850    PT Stop Time 0935    PT Time Calculation (min) 45 min    Activity Tolerance Patient tolerated treatment well    Behavior During Therapy [facility] for tasks assessed/performed                      Past Medical History:  Diagnosis Date   Anemia 2001   after gastric bypass   Anxiety    Aortic atherosclerosis    Arthritis    Back pain    Bilateral swelling of feet    Bipolar 1 disorder    Breast cancer 2013   Breast cancer    Radiation therapy   Calcified granuloma of lung    Cigarette nicotine dependence    Cigarette nicotine dependence in remission    Cocaine dependence in remission    COPD (chronic obstructive pulmonary disease)    Depression    Disequilibrium    Fibromyalgia    GERD (gastroesophageal reflux disease)    HSV infection    Hypertension    Hypertensive kidney disease with stage 3a chronic kidney disease    Hypertensive kidney disease with stage 3b chronic kidney disease    Osteoarthritis    Personal history of infectious disease    Personal history of radiation therapy 2013   Pneumonia    Pre-diabetes    PTSD (post-traumatic stress disorder)    Seasonal allergic rhinitis due to pollen    Sleep apnea    uses CPAP   SOB (shortness of  breath)    Vitamin D deficiency    Past Surgical History:  Procedure Laterality Date   ABDOMINAL HYSTERECTOMY  2018   BREAST LUMPECTOMY Right 2013   GASTRIC BYPASS  2001   TOTAL KNEE ARTHROPLASTY Left 12/22/2018   Procedure: LEFT TOTAL KNEE ARTHROPLASTY;  Surgeon: Leandrew Koyanagi, MD;  Location: [place];  Service: Orthopedics;  Laterality: Left;   Patient Active Problem List   Diagnosis Date Noted   Chronic kidney disease, stage 3a 09/18/2022   Pain due to onychomycosis of toenails of both feet 01/20/2020   Shortness of breath 10/05/2019   Status post total left knee replacement 12/22/2018   MDD (major depressive disorder), recurrent episode, mild 10/29/2018   Autoimmune hepatitis 10/29/2018   OSA (obstructive sleep apnea) 10/29/2018   Renal cyst, right 10/22/2018   Lung nodule, solitary 10/22/2018   Genital herpes simplex 10/29/2017   Vitamin D deficiency 10/29/2017   Morbid obesity 10/29/2017   Essential hypertension 10/29/2017   Prediabetes 02/14/2017   Fibromyalgia 02/14/2017   Substance abuse in remission 02/14/2017   Anxiety and depression 02/14/2017   HX: breast cancer 02/14/2017   Urge incontinence 11/13/2016  Unilateral primary osteoarthritis, left knee 06/14/2016   Hx of breast cancer 04/02/2016   GERD (gastroesophageal reflux disease) 11/02/2015   Angioedema 10/24/2013   Tobacco abuse 10/24/2013   DCIS (ductal carcinoma in situ) of breast 04/07/2013   Depression 04/07/2013    REFERRING DIAG: ICD-10:Z47.89: Encounter for other orthopedic aftercare. Left shoulder scope SAD/SCR/RCR  THERAPY DIAG:  Acute pain of left shoulder  Stiffness of left shoulder, not elsewhere classified  Muscle weakness (generalized)  Rationale for Evaluation and Treatment Rehabilitation  PERTINENT HISTORY: High BMI, Arthritis, anxiety, COPD  PRECAUTIONS: Shoulder  07/03/22 Surgery   SUBJECTIVE:                                                                                                                                                                                     SUBJECTIVE STATEMENT:  Pt reports her L shoulder stated bothering her more yesterday PAIN:  Are you having pain? Yes: NPRS scale: 4/10 Pain location: L shoulder Pain description: ache, constant Aggravating factors: Dressing Relieving factors: Rest, hot shower, medication   OBJECTIVE: (objective measures completed at initial evaluation unless otherwise dated)   DIAGNOSTIC FINDINGS:  None recent for L shoulder in Epic   PATIENT SURVEYS:  FOTO: Perceived function   32%, predicted   58%; 08/22/22= 50%    COGNITION: Overall cognitive status: Within functional limits for tasks assessed                                  SENSATION: WFL   POSTURE: Forward head c rounded shoulders   UPPER EXTREMITY ROM:    Passive ROM Right eval Left eval Left 07/19/22 07/25/22 LT LT 07/26/22 LT 08/01/22 LT 08/15/22 LT 08/24/22 Lt 09/04/22  Shoulder flexion A140 P60 P80 P105 P115 P125 AA150 AA155 A100  Shoulder extension             Shoulder abduction A140 P60 P80 P80  100P  AA130   Shoulder adduction             Shoulder internal rotation             Shoulder external rotation A70 P40 P40 P50  P60 AA80 AA80   Elbow flexion             Elbow extension             Wrist flexion             Wrist extension             Wrist ulnar deviation  Wrist radial deviation             Wrist pronation             Wrist supination             (Blank rows = not tested)  AROM flex 09/06/22= 112d 09/14/22=130d  UPPER EXTREMITY MMT:            NT MMT Right eval Left eval  Shoulder flexion      Shoulder extension      Shoulder abduction      Shoulder adduction      Shoulder internal rotation      Shoulder external rotation      Middle trapezius      Lower trapezius      Elbow flexion      Elbow extension      Wrist flexion      Wrist extension      Wrist ulnar deviation      Wrist radial deviation       Wrist pronation      Wrist supination      Grip strength (lbs)      (Blank rows = not tested)   SHOULDER SPECIAL TESTS: Impingement tests:  NT SLAP lesions:  NT Instability tests:  NT Rotator cuff assessment:  NT Biceps assessment:  NT   JOINT MOBILITY TESTING:  NT   PALPATION:  TTP of the L peri-shoulder         TODAY'S TREATMENT: [facility] Adult PT Treatment:                                                DATE: 09/27/22 Therapeutic Exercise: Shoulder pulleys for flexion and scaption Wall slides flexion and scaption x10 each Serratus wall slides c pillow case 2x10 Ball circles on wall  Standing shoulder ER RTB 3x10 Standing shoulder IR GTB 3x10 Standing shoulder ext 3x10 Modalities: Vaso 15 mins, 34d, mod pressure Self Care: Use of cold pack for pain management  [facility] Adult PT Treatment:                                                DATE: 09/25/22 Therapeutic Exercise: UBE L1 10min 2 mins FWD, 2 mins BWD 45d incline L shoulder flexion 3x10; 0,1, 2# 10 each S/L shoulder ER 2x10 3# S/L shoulder abd 3x10 3# Lifting to chest height 2#, shoulder height 1# x10 each Wall serratus press 3x10 Standing shoulder row GTB 3x10 Standing shoulder ext GTB 3x10  [facility] Adult PT Treatment:                                                DATE: 09/14/22 Therapeutic Exercise: UBE L1 48min 2 mins FWD, 2 mins BWD Shoulder flexion swiss ball wall rolls L shoulder wall circles c small ball S/L shoulder ER 3x10 2# S/L shoulder abd 3x10 2# Shoulder flexion to 90d 2x10 no wt Modalities: Vaso 15 mins, 34d, mod pressure  PATIENT EDUCATION: Education details: Eval findings, POC, HEP, self care Person educated: Patient Education method: Explanation, Demonstration, Tactile cues, Verbal  cues, and Handouts Education comprehension: verbalized understanding, returned demonstration, verbal cues required, and tactile cues required   HOME EXERCISE PROGRAM: Access Code: [email or web address] URL:  [email or web address] Date: 09/11/2022 Prepared by: Gar Ponto  Exercises - Supine Shoulder External Rotation with Dowel  - 1 x daily - 7 x weekly - 1 sets - 10 reps - 3 hold - Supine Shoulder Flexion Extension AAROM with Dowel  - 1 x daily - 7 x weekly - 1 sets - 10 reps - 3 hold - Standing Shoulder Row with Anchored Resistance  - 1 x daily - 7 x weekly - 3 sets - 10 reps - 2 hold - Shoulder Extension with Resistance  - 1 x daily - 7 x weekly - 3 sets - 10 reps - 2 hold - Standing Serratus Punch with Resistance  - 1 x daily - 7 x weekly - 3 sets - 10 reps - 2 hold - Shoulder External Rotation with Anchored Resistance (Mirrored)  - 1 x daily - 7 x weekly - 3 sets - 10 reps - Shoulder Internal Rotation with Resistance (Mirrored)  - 1 x daily - 7 x weekly - 3 sets - 10 reps - Shoulder Flexion Wall Slide with Towel  - 1 x daily - 7 x weekl y - 1 sets - 10 reps - 5 hold - Shoulder Scaption Wall Slide with Towel  - 1 x daily - 7 x weekly - 1 sets - 10 reps - 5 hold  ASSESSMENT:   CLINICAL IMPRESSION: PT was completed for L shoulder periscapular and RC strengthening utilizing therabands and dynamaic stability. Reassess FOTO and pt has continued to make good progress, nearing reaching the FOTO projected goal. Vaso was completed at the end of session for pain management with the pt's L shoulder hurting more the past couple of days. Pt tolerated PT today without adverse effects. Pt will continue to benefit from skilled PT to address impairments for improved function.   OBJECTIVE IMPAIRMENTS: decreased activity tolerance, decreased ROM, decreased strength, impaired UE functional use, pain, and heavy arm .    ACTIVITY LIMITATIONS: carrying, lifting, sleeping, bed mobility, bathing, toileting, dressing, reach over head, and caring for others   PARTICIPATION LIMITATIONS: meal prep, cleaning, laundry, and driving   PERSONAL FACTORS: Age, Fitness, Past/current experiences, Social  background, and 1 comorbidity: High BMI-heavy arm  are also affecting patient's functional outcome.    REHAB POTENTIAL: Good   CLINICAL DECISION MAKING: Evolving/moderate complexity   EVALUATION COMPLEXITY: Moderate     GOALS:   SHORT TERM GOALS: Target date: 07/27/22   Pt will be Ind in an initial HEP  Baseline: Goal status:07/26/22=MET   LONG TERM GOALS: Target date: 11/02/22   Pt will be Ind in a final HEP to maintain achieved LOF Baseline: initiated Goal status: Ongoing   2.  Improve L shoulder PROM to flexion 140, abd 120, ER 50  Baseline: see flow sheet Status: 08/01/22=see flow sheets. 08/15/22=see flow sheet. 08/24/22= see flow sheet  Goal status: MET   3.  Pt's FOTO score will improved to the predicted value of 58% as indication of improved function by DC anticpated to be after 09/14/22 Baseline: 32% Status:=50% 09/27/22=57% Goal status: Improving   4.  Develop LTGs after 09/14/22 date when pt is able to particiapte in AROM and strengthening therex Baseline:  Goal status: MET 09/04/22  5.  Pt's will demonstrate  AROM of the L shoulder wihtin 90% of the R for god functional ROM  Baseline: See  flow sheet Goal status: New as of 09/04/22  6. Pt will demonstrate 4+/5 L shoulder strength for all motions for appropriate function of the L shoulder Baseline: NT Goal status: New as of 09/04/22  7. Pt will be able to lift 8# to shoulder height x5 for strength with functional activities at home Baseline: NT Goal status: New as of 09/04/22    PLAN:   PT FREQUENCY: 2x/week   PT DURATION: 8 weeks   PLANNED INTERVENTIONS: Therapeutic exercises, Therapeutic activity, Patient/Family education, Self Care, Joint mobilization, Aquatic Therapy, Dry Needling, Electrical stimulation, Cryotherapy, Moist heat, Taping, Vasopneumatic device, Ultrasound, Ionotophoresis 4mg /ml Dexamethasone, Manual therapy, and Re-evaluation   PLAN FOR NEXT SESSION: Review FOTO; assess response to HEP; progress  therex as indicated; use of modalities, manual therapy; and TPDN as indicated.   Jericca Russett MS, PT 09/27/22 10:27 AM

## 2022-09-27 ENCOUNTER — Ambulatory Visit: Payer: Medicare HMO

## 2022-09-27 DIAGNOSIS — M6281 Muscle weakness (generalized): Secondary | ICD-10-CM | POA: Diagnosis not present

## 2022-09-27 DIAGNOSIS — M25512 Pain in left shoulder: Secondary | ICD-10-CM

## 2022-09-27 DIAGNOSIS — M25612 Stiffness of left shoulder, not elsewhere classified: Secondary | ICD-10-CM

## 2022-09-27 DIAGNOSIS — M79604 Pain in right leg: Secondary | ICD-10-CM | POA: Diagnosis not present

## 2022-10-01 NOTE — Therapy (Signed)
OUTPATIENT PHYSICAL THERAPY TREATMENT NOTE   Patient Name: Rachel Woodard MRN: [id number] DOB:[birth year], 72 y.o., female Today's Date: 10/02/2022  PCP: Collene Mares, [place]   REFERRING PROVIDER: Eugenia Mcalpine, MD  See note below for Objective Data and Assessment of Progress/Goals.   END OF SESSION:   PT End of Session - 10/02/22 1154     Visit Number 18    Number of Visits 24    Date for PT Re-Evaluation 11/02/22    Authorization Type HUMA[employer] MEDICARE HMO;  MEDICAID [place] ACCESS    Authorization Time Period Approved 12 visits 09/04/22-10/20/22; 07/18/22-09/01/22 12    Authorization - Visit Number 16    Authorization - Number of Visits 24    Progress Note Due on Visit 20    PT Start Time 1151    PT Stop Time 1233    PT Time Calculation (min) 42 min    Activity Tolerance Patient tolerated treatment well    Behavior During Therapy WFL for tasks assessed/performed                       Past Medical History:  Diagnosis Date   Anemia 2001   after gastric bypass   Anxiety    Aortic atherosclerosis    Arthritis    Back pain    Bilateral swelling of feet    Bipolar 1 disorder    Breast cancer 2013   Breast cancer    Radiation therapy   Calcified granuloma of lung    Cigarette nicotine dependence    Cigarette nicotine dependence in remission    Cocaine dependence in remission    COPD (chronic obstructive pulmonary disease)    Depression    Disequilibrium    Fibromyalgia    GERD (gastroesophageal reflux disease)    HSV infection    Hypertension    Hypertensive kidney disease with stage 3a chronic kidney disease    Hypertensive kidney disease with stage 3b chronic kidney disease    Osteoarthritis    Personal history of infectious disease    Personal history of radiation therapy 2013   Pneumonia    Pre-diabetes    PTSD (post-traumatic stress disorder)    Seasonal allergic rhinitis due to pollen    Sleep apnea    uses CPAP   SOB (shortness  of breath)    Vitamin D deficiency    Past Surgical History:  Procedure Laterality Date   ABDOMINAL HYSTERECTOMY  2018   BREAST LUMPECTOMY Right 2013   GASTRIC BYPASS  2001   TOTAL KNEE ARTHROPLASTY Left 12/22/2018   Procedure: LEFT TOTAL KNEE ARTHROPLASTY;  Surgeon: Tarry Kos, MD;  Location: [facility];  Service: Orthopedics;  Laterality: Left;   Patient Active Problem List   Diagnosis Date Noted   Chronic kidney disease, stage 3a 09/18/2022   Pain due to onychomycosis of toenails of both feet 01/20/2020   Shortness of breath 10/05/2019   Status post total left knee replacement 12/22/2018   MDD (major depressive disorder), recurrent episode, mild 10/29/2018   Autoimmune hepatitis 10/29/2018   OSA (obstructive sleep apnea) 10/29/2018   Renal cyst, right 10/22/2018   Lung nodule, solitary 10/22/2018   Genital herpes simplex 10/29/2017   Vitamin D deficiency 10/29/2017   Morbid obesity 10/29/2017   Essential hypertension 10/29/2017   Prediabetes 02/14/2017   Fibromyalgia 02/14/2017   Substance abuse in remission 02/14/2017   Anxiety and depression 02/14/2017   HX: breast cancer 02/14/2017   Urge incontinence  11/13/2016   Unilateral primary osteoarthritis, left knee 06/14/2016   Hx of breast cancer 04/02/2016   GERD (gastroesophageal reflux disease) 11/02/2015   Angioedema 10/24/2013   Tobacco abuse 10/24/2013   DCIS (ductal carcinoma in situ) of breast 04/07/2013   Depression 04/07/2013    REFERRING DIAG: ICD-10:Z47.89: Encounter for other orthopedic aftercare. Left shoulder scope SAD/SCR/RCR  THERAPY DIAG:  Acute pain of left shoulder  Stiffness of left shoulder, not elsewhere classified  Muscle weakness (generalized)  Rationale for Evaluation and Treatment Rehabilitation  PERTINENT HISTORY: High BMI, Arthritis, anxiety, COPD  PRECAUTIONS: Shoulder  07/03/22 Surgery   SUBJECTIVE:                                                                                                                                                                                     SUBJECTIVE STATEMENT:  Pt reports pain has improved, but her L arm seems a little weaker. PAIN:  Are you having pain? Yes: NPRS scale: 0/10 Pain location: L shoulder Pain description: ache, constant Aggravating factors: Dressing Relieving factors: Rest, hot shower, medication   OBJECTIVE: (objective measures completed at initial evaluation unless otherwise dated)   DIAGNOSTIC FINDINGS:  None recent for L shoulder in Epic   PATIENT SURVEYS:  FOTO: Perceived function   32%, predicted   58%; 08/22/22= 50%    COGNITION: Overall cognitive status: Within functional limits for tasks assessed                                  SENSATION: WFL   POSTURE: Forward head c rounded shoulders   UPPER EXTREMITY ROM:    Passive ROM Right eval Left eval Left 07/19/22 07/25/22 LT LT 07/26/22 LT 08/01/22 LT 08/15/22 LT 08/24/22 Lt 09/04/22  Shoulder flexion A140 P60 P80 P105 P115 P125 AA150 AA155 A100  Shoulder extension             Shoulder abduction A140 P60 P80 P80  100P  AA130   Shoulder adduction             Shoulder internal rotation             Shoulder external rotation A70 P40 P40 P50  P60 AA80 AA80   Elbow flexion             Elbow extension             Wrist flexion             Wrist extension             Wrist ulnar deviation  Wrist radial deviation             Wrist pronation             Wrist supination             (Blank rows = not tested)  AROM flex 09/06/22= 112d 09/14/22=130d. 10/02/22=130d min shrug   UPPER EXTREMITY MMT:            NT MMT Right eval Left eval  Shoulder flexion      Shoulder extension      Shoulder abduction      Shoulder adduction      Shoulder internal rotation      Shoulder external rotation      Middle trapezius      Lower trapezius      Elbow flexion      Elbow extension      Wrist flexion      Wrist extension      Wrist ulnar deviation       Wrist radial deviation      Wrist pronation      Wrist supination      Grip strength (lbs)      (Blank rows = not tested)   SHOULDER SPECIAL TESTS: Impingement tests:  NT SLAP lesions:  NT Instability tests:  NT Rotator cuff assessment:  NT Biceps assessment:  NT   JOINT MOBILITY TESTING:  NT   PALPATION:  TTP of the L peri-shoulder         TODAY'S TREATMENT: [facility] Adult PT Treatment:                                                DATE: 10/02/22 Therapeutic Exercise: UBE L1 2 mins FWD, 2 mins BWD Arm curls 2x15 4# Shoulder flexion and scaption to 90d  2x10 each min shrug  S/L shoulder ER 2x10 2# S/L shoulder abd 3x10 2# Supine shoulder circles 90d 3# Chest pull 25# 3x10 Omega Lat pull 25# 3x10 Omega  [facility] Adult PT Treatment:                                                DATE: 09/27/22 Therapeutic Exercise: Shoulder pulleys for flexion and scaption Wall slides flexion and scaption x10 each Serratus wall slides c pillow case 2x10 Ball circles on wall  Standing shoulder ER RTB 3x10 Standing shoulder IR GTB 3x10 Standing shoulder ext 3x10 Modalities: Vaso 15 mins, 34d, mod pressure Self Care: Use of cold pack for pain management  [facility] Adult PT Treatment:                                                DATE: 09/25/22 Therapeutic Exercise: UBE L1 2 mins FWD, 2 mins BWD 45d incline L shoulder flexion 3x10; 0,1, 2# 10 each S/L shoulder ER 2x10 3# S/L shoulder abd 3x10 3# Lifting to chest height 2#, shoulder height 1# x10 each Wall serratus press 3x10 Standing shoulder row GTB 3x10 Standing shoulder ext GTB 3x10  PATIENT EDUCATION: Education details: Eval findings, POC, HEP, self care Person educated:  Patient Education method: Explanation, Demonstration, Tactile cues, Verbal cues, and Handouts Education comprehension: verbalized understanding, returned demonstration, verbal cues required, and tactile cues required   HOME EXERCISE PROGRAM: Access Code:  [email or web address] URL: [email or web address] Date: 09/11/2022 Prepared by: Joellyn Rued  Exercises - Supine Shoulder External Rotation with Dowel  - 1 x daily - 7 x weekly - 1 sets - 10 reps - 3 hold - Supine Shoulder Flexion Extension AAROM with Dowel  - 1 x daily - 7 x weekly - 1 sets - 10 reps - 3 hold - Standing Shoulder Row with Anchored Resistance  - 1 x daily - 7 x weekly - 3 sets - 10 reps - 2 hold - Shoulder Extension with Resistance  - 1 x daily - 7 x weekly - 3 sets - 10 reps - 2 hold - Standing Serratus Punch with Resistance  - 1 x daily - 7 x weekly - 3 sets - 10 reps - 2 hold - Shoulder External Rotation with Anchored Resistance (Mirrored)  - 1 x daily - 7 x weekly - 3 sets - 10 reps - Shoulder Internal Rotation with Resistance (Mirrored)  - 1 x daily - 7 x weekly - 3 sets - 10 reps - Shoulder Flexion Wall Slide with Towel  - 1 x daily - 7 x weekl y - 1 sets - 10 reps - 5 hold - Shoulder Scaption Wall Slide with Towel  - 1 x daily - 7 x weekly - 1 sets - 10 reps - 5 hold  ASSESSMENT:   CLINICAL IMPRESSION: PT was continued for L shoulder periscapular and RC with greater emphasis on the RC. Pt tolerated PT today without adverse effects. L shoulder AROM for flexion continues at 130d. A min shrug is present. Additional L shoulder strengthening is needed for improved quality of L shoulder elevation. Progress continues to be appropriate. Pt tolerated PT today without adverse effects. Pt will continue to benefit from skilled PT to address impairments for improved function.    OBJECTIVE IMPAIRMENTS: decreased activity tolerance, decreased ROM, decreased strength, impaired UE functional use, pain, and heavy arm .    ACTIVITY LIMITATIONS: carrying, lifting, sleeping, bed mobility, bathing, toileting, dressing, reach over head, and caring for others   PARTICIPATION LIMITATIONS: meal prep, cleaning, laundry, and driving   PERSONAL FACTORS: Age, Fitness, Past/current experiences,  Social background, and 1 comorbidity: High BMI-heavy arm  are also affecting patient's functional outcome.    REHAB POTENTIAL: Good   CLINICAL DECISION MAKING: Evolving/moderate complexity   EVALUATION COMPLEXITY: Moderate     GOALS:   SHORT TERM GOALS: Target date: 07/27/22   Pt will be Ind in an initial HEP  Baseline: Goal status:07/26/22=MET   LONG TERM GOALS: Target date: 11/02/22   Pt will be Ind in a final HEP to maintain achieved LOF Baseline: initiated Goal status: Ongoing   2.  Improve L shoulder PROM to flexion 140, abd 120, ER 50  Baseline: see flow sheet Status: 08/01/22=see flow sheets. 08/15/22=see flow sheet. 08/24/22= see flow sheet  Goal status: MET   3.  Pt's FOTO score will improved to the predicted value of 58% as indication of improved function by DC anticpated to be after 09/14/22 Baseline: 32% Status:=50% 09/27/22=57% Goal status: Improving   4.  Develop LTGs after 09/14/22 date when pt is able to particiapte in AROM and strengthening therex Baseline:  Goal status: MET 09/04/22  5.  Pt's will demonstrate  AROM of the L shoulder wihtin 90% of the  R for god functional ROM  Baseline: See flow sheet Goal status: New as of 09/04/22  6. Pt will demonstrate 4+/5 L shoulder strength for all motions for appropriate function of the L shoulder Baseline: NT Goal status: New as of 09/04/22  7. Pt will be able to lift 8# to shoulder height x5 for strength with functional activities at home Baseline: NT Goal status: New as of 09/04/22    PLAN:   PT FREQUENCY: 2x/week   PT DURATION: 8 weeks   PLANNED INTERVENTIONS: Therapeutic exercises, Therapeutic activity, Patient/Family education, Self Care, Joint mobilization, Aquatic Therapy, Dry Needling, Electrical stimulation, Cryotherapy, Moist heat, Taping, Vasopneumatic device, Ultrasound, Ionotophoresis 4mg /ml Dexamethasone, Manual therapy, and Re-evaluation   PLAN FOR NEXT SESSION: Review FOTO; assess response to HEP;  progress therex as indicated; use of modalities, manual therapy; and TPDN as indicated.   Korene Dula MS, PT 10/02/22 12:55 PM

## 2022-10-02 ENCOUNTER — Ambulatory Visit: Payer: Medicare HMO

## 2022-10-02 DIAGNOSIS — M25612 Stiffness of left shoulder, not elsewhere classified: Secondary | ICD-10-CM

## 2022-10-02 DIAGNOSIS — M6281 Muscle weakness (generalized): Secondary | ICD-10-CM

## 2022-10-02 DIAGNOSIS — M25512 Pain in left shoulder: Secondary | ICD-10-CM

## 2022-10-02 DIAGNOSIS — M79604 Pain in right leg: Secondary | ICD-10-CM | POA: Diagnosis not present

## 2022-10-03 DIAGNOSIS — N3942 Incontinence without sensory awareness: Secondary | ICD-10-CM | POA: Diagnosis not present

## 2022-10-04 ENCOUNTER — Ambulatory Visit: Payer: Medicare HMO

## 2022-10-09 ENCOUNTER — Ambulatory Visit: Payer: Medicare HMO

## 2022-10-09 DIAGNOSIS — M6281 Muscle weakness (generalized): Secondary | ICD-10-CM | POA: Diagnosis not present

## 2022-10-09 DIAGNOSIS — M25612 Stiffness of left shoulder, not elsewhere classified: Secondary | ICD-10-CM | POA: Diagnosis not present

## 2022-10-09 DIAGNOSIS — M79604 Pain in right leg: Secondary | ICD-10-CM | POA: Diagnosis not present

## 2022-10-09 DIAGNOSIS — M25512 Pain in left shoulder: Secondary | ICD-10-CM

## 2022-10-09 NOTE — Therapy (Signed)
OUTPATIENT PHYSICAL THERAPY TREATMENT NOTE   Patient Name: Rachel Woodard MRN: [id number] DOB:[birth year], 72 y.o., female Today's Date: 10/09/2022  PCP: Collene Mares, [place]   REFERRING PROVIDER: Eugenia Mcalpine, MD  See note below for Objective Data and Assessment of Progress/Goals.   END OF SESSION:   PT End of Session - 10/09/22 1022     Visit Number 19    Number of Visits 24    Date for PT Re-Evaluation 11/02/22    Authorization Type [employer] MEDICARE HMO;  MEDICAID [place] ACCESS    Authorization Time Period Approved 12 visits 09/04/22-10/20/22; 07/18/22-09/01/22 12    Authorization - Visit Number 17    Authorization - Number of Visits 24    Progress Note Due on Visit 20    PT Start Time 1020    PT Stop Time 1100    PT Time Calculation (min) 40 min    Activity Tolerance Patient tolerated treatment well    Behavior During Therapy WFL for tasks assessed/performed                        Past Medical History:  Diagnosis Date   Anemia 2001   after gastric bypass   Anxiety    Aortic atherosclerosis    Arthritis    Back pain    Bilateral swelling of feet    Bipolar 1 disorder    Breast cancer 2013   Breast cancer    Radiation therapy   Calcified granuloma of lung    Cigarette nicotine dependence    Cigarette nicotine dependence in remission    Cocaine dependence in remission    COPD (chronic obstructive pulmonary disease)    Depression    Disequilibrium    Fibromyalgia    GERD (gastroesophageal reflux disease)    HSV infection    Hypertension    Hypertensive kidney disease with stage 3a chronic kidney disease    Hypertensive kidney disease with stage 3b chronic kidney disease    Osteoarthritis    Personal history of infectious disease    Personal history of radiation therapy 2013   Pneumonia    Pre-diabetes    PTSD (post-traumatic stress disorder)    Seasonal allergic rhinitis due to pollen    Sleep apnea    uses CPAP   SOB  (shortness of breath)    Vitamin D deficiency    Past Surgical History:  Procedure Laterality Date   ABDOMINAL HYSTERECTOMY  2018   BREAST LUMPECTOMY Right 2013   GASTRIC BYPASS  2001   TOTAL KNEE ARTHROPLASTY Left 12/22/2018   Procedure: LEFT TOTAL KNEE ARTHROPLASTY;  Surgeon: Tarry Kos, MD;  Location: [facility] OR;  Service: Orthopedics;  Laterality: Left;   Patient Active Problem List   Diagnosis Date Noted   Chronic kidney disease, stage 3a 09/18/2022   Pain due to onychomycosis of toenails of both feet 01/20/2020   Shortness of breath 10/05/2019   Status post total left knee replacement 12/22/2018   MDD (major depressive disorder), recurrent episode, mild 10/29/2018   Autoimmune hepatitis 10/29/2018   OSA (obstructive sleep apnea) 10/29/2018   Renal cyst, right 10/22/2018   Lung nodule, solitary 10/22/2018   Genital herpes simplex 10/29/2017   Vitamin D deficiency 10/29/2017   Morbid obesity 10/29/2017   Essential hypertension 10/29/2017   Prediabetes 02/14/2017   Fibromyalgia 02/14/2017   Substance abuse in remission 02/14/2017   Anxiety and depression 02/14/2017   HX: breast cancer 02/14/2017   Urge  incontinence 11/13/2016   Unilateral primary osteoarthritis, left knee 06/14/2016   Hx of breast cancer 04/02/2016   GERD (gastroesophageal reflux disease) 11/02/2015   Angioedema 10/24/2013   Tobacco abuse 10/24/2013   DCIS (ductal carcinoma in situ) of breast 04/07/2013   Depression 04/07/2013    REFERRING DIAG: ICD-10:Z47.89: Encounter for other orthopedic aftercare. Left shoulder scope SAD/SCR/RCR  THERAPY DIAG:  Acute pain of left shoulder  Stiffness of left shoulder, not elsewhere classified  Muscle weakness (generalized)  Rationale for Evaluation and Treatment Rehabilitation  PERTINENT HISTORY: High BMI, Arthritis, anxiety, COPD  PRECAUTIONS: Shoulder  07/03/22 Surgery   SUBJECTIVE:                                                                                                                                                                                     SUBJECTIVE STATEMENT:  Pt reports her L shoulder is doing well. PAIN:  Are you having pain? Yes: NPRS scale: 0/10 Pain location: L shoulder Pain description: ache, constant Aggravating factors: Dressing Relieving factors: Rest, hot shower, medication   OBJECTIVE: (objective measures completed at initial evaluation unless otherwise dated)   DIAGNOSTIC FINDINGS:  None recent for L shoulder in Epic   PATIENT SURVEYS:  FOTO: Perceived function   32%, predicted   58%; 08/22/22= 50%    COGNITION: Overall cognitive status: Within functional limits for tasks assessed                                  SENSATION: WFL   POSTURE: Forward head c rounded shoulders   UPPER EXTREMITY ROM:    Passive ROM Right eval Left eval Left 07/19/22 07/25/22 LT LT 07/26/22 LT 08/01/22 LT 08/15/22 LT 08/24/22 Lt 09/04/22  Shoulder flexion A140 P60 P80 P105 P115 P125 AA150 AA155 A100  Shoulder extension             Shoulder abduction A140 P60 P80 P80  100P  AA130   Shoulder adduction             Shoulder internal rotation             Shoulder external rotation A70 P40 P40 P50  P60 AA80 AA80   Elbow flexion             Elbow extension             Wrist flexion             Wrist extension             Wrist ulnar deviation  Wrist radial deviation             Wrist pronation             Wrist supination             (Blank rows = not tested)  AROM flex 09/06/22= 112d 09/14/22=130d. 10/02/22=130d min shrug, 10/09/22= 140d  UPPER EXTREMITY MMT:            NT MMT Right eval Left eval  Shoulder flexion      Shoulder extension      Shoulder abduction      Shoulder adduction      Shoulder internal rotation      Shoulder external rotation      Middle trapezius      Lower trapezius      Elbow flexion      Elbow extension      Wrist flexion      Wrist extension      Wrist ulnar deviation       Wrist radial deviation      Wrist pronation      Wrist supination      Grip strength (lbs)      (Blank rows = not tested)   SHOULDER SPECIAL TESTS: Impingement tests:  NT SLAP lesions:  NT Instability tests:  NT Rotator cuff assessment:  NT Biceps assessment:  NT   JOINT MOBILITY TESTING:  NT   PALPATION:  TTP of the L peri-shoulder         TODAY'S TREATMENT: [facility] Adult PT Treatment:                                                DATE: 10/09/22 Therapeutic Exercise: UBE L1 2 mins FWD, 2 mins BWD Flexion and scaption wall slide Shoulder hor abd 2x12 Above shoulder lifting 2# 2x10 S/L L ER 3# 2x10 Supine Serratus chest press 5# bilat 2x10 Standing shoulder row 3x10 GTB Standing shoulder row 3x10 GTB  [facility] Adult PT Treatment:                                                DATE: 10/02/22 Therapeutic Exercise: UBE L1 2 mins FWD, 2 mins BWD Arm curls 2x15 4# Shoulder flexion and scaption to 90d  2x10 each min shrug  S/L shoulder ER 2x10 2# S/L shoulder abd 3x10 2# Supine shoulder circles 90d 3# Chest pull 25# 3x10 Omega Lat pull 25# 3x10 Omega  [facility] Adult PT Treatment:                                                DATE: 09/27/22 Therapeutic Exercise: Shoulder pulleys for flexion and scaption Wall slides flexion and scaption x10 each Serratus wall slides c pillow case 2x10 Ball circles on wall  Standing shoulder ER RTB 3x10 Standing shoulder IR GTB 3x10 Standing shoulder ext 3x10 Modalities: Vaso 15 mins, 34d, mod pressure Self Care: Use of cold pack for pain management  PATIENT EDUCATION: Education details: Eval findings, POC, HEP, self care Person educated: Patient Education method: Explanation, Demonstration, Tactile cues,  Verbal cues, and Handouts Education comprehension: verbalized understanding, returned demonstration, verbal cues required, and tactile cues required   HOME EXERCISE PROGRAM: Access Code: [email or web address] URL:  [email or web address] Date: 09/11/2022 Prepared by: Joellyn Rued  Exercises - Supine Shoulder External Rotation with Dowel  - 1 x daily - 7 x weekly - 1 sets - 10 reps - 3 hold - Supine Shoulder Flexion Extension AAROM with Dowel  - 1 x daily - 7 x weekly - 1 sets - 10 reps - 3 hold - Standing Shoulder Row with Anchored Resistance  - 1 x daily - 7 x weekly - 3 sets - 10 reps - 2 hold - Shoulder Extension with Resistance  - 1 x daily - 7 x weekly - 3 sets - 10 reps - 2 hold - Standing Serratus Punch with Resistance  - 1 x daily - 7 x weekly - 3 sets - 10 reps - 2 hold - Shoulder External Rotation with Anchored Resistance (Mirrored)  - 1 x daily - 7 x weekly - 3 sets - 10 reps - Shoulder Internal Rotation with Resistance (Mirrored)  - 1 x daily - 7 x weekly - 3 sets - 10 reps - Shoulder Flexion Wall Slide with Towel  - 1 x daily - 7 x weekl y - 1 sets - 10 reps - 5 hold - Shoulder Scaption Wall Slide with Towel  - 1 x daily - 7 x weekly - 1 sets - 10 reps - 5 hold  ASSESSMENT:   CLINICAL IMPRESSION: Pt demonstrated increased L shoulder flexion with improved quality of movement approaching end range of motion. PT continues to gradually progress demand for RC and periscapular strengthening. Pt is making appropriate progress. Pt tolerated PT today without adverse effects. Pt will continue to benefit from skilled PT to address impairments for improved function.    OBJECTIVE IMPAIRMENTS: decreased activity tolerance, decreased ROM, decreased strength, impaired UE functional use, pain, and heavy arm .    ACTIVITY LIMITATIONS: carrying, lifting, sleeping, bed mobility, bathing, toileting, dressing, reach over head, and caring for others   PARTICIPATION LIMITATIONS: meal prep, cleaning, laundry, and driving   PERSONAL FACTORS: Age, Fitness, Past/current experiences, Social background, and 1 comorbidity: High BMI-heavy arm  are also affecting patient's functional outcome.    REHAB  POTENTIAL: Good   CLINICAL DECISION MAKING: Evolving/moderate complexity   EVALUATION COMPLEXITY: Moderate     GOALS:   SHORT TERM GOALS: Target date: 07/27/22   Pt will be Ind in an initial HEP  Baseline: Goal status:07/26/22=MET   LONG TERM GOALS: Target date: 11/02/22   Pt will be Ind in a final HEP to maintain achieved LOF Baseline: initiated Goal status: Ongoing   2.  Improve L shoulder PROM to flexion 140, abd 120, ER 50  Baseline: see flow sheet Status: 08/01/22=see flow sheets. 08/15/22=see flow sheet. 08/24/22= see flow sheet  Goal status: MET   3.  Pt's FOTO score will improved to the predicted value of 58% as indication of improved function by DC anticpated to be after 09/14/22 Baseline: 32% Status:=50% 09/27/22=57% Goal status: Improving   4.  Develop LTGs after 09/14/22 date when pt is able to particiapte in AROM and strengthening therex Baseline:  Goal status: MET 09/04/22  5.  Pt's will demonstrate  AROM of the L shoulder wihtin 90% of the R for god functional ROM  Baseline: See flow sheet Goal status: New as of 09/04/22  6. Pt will demonstrate 4+/5 L shoulder strength for all motions  for appropriate function of the L shoulder Baseline: NT Goal status: New as of 09/04/22  7. Pt will be able to lift 8# to shoulder height x5 for strength with functional activities at home Baseline: NT Goal status: New as of 09/04/22    PLAN:   PT FREQUENCY: 2x/week   PT DURATION: 8 weeks   PLANNED INTERVENTIONS: Therapeutic exercises, Therapeutic activity, Patient/Family education, Self Care, Joint mobilization, Aquatic Therapy, Dry Needling, Electrical stimulation, Cryotherapy, Moist heat, Taping, Vasopneumatic device, Ultrasound, Ionotophoresis 4mg /ml Dexamethasone, Manual therapy, and Re-evaluation   PLAN FOR NEXT SESSION: Review FOTO; assess response to HEP; progress therex as indicated; use of modalities, manual therapy; and TPDN as indicated.   Jarrod Bodkins MS, PT 10/09/22  1:10 PM

## 2022-10-16 ENCOUNTER — Ambulatory Visit: Payer: Medicare HMO

## 2022-10-16 DIAGNOSIS — M79604 Pain in right leg: Secondary | ICD-10-CM | POA: Diagnosis not present

## 2022-10-16 DIAGNOSIS — M6281 Muscle weakness (generalized): Secondary | ICD-10-CM

## 2022-10-16 DIAGNOSIS — M25612 Stiffness of left shoulder, not elsewhere classified: Secondary | ICD-10-CM

## 2022-10-16 DIAGNOSIS — M25512 Pain in left shoulder: Secondary | ICD-10-CM | POA: Diagnosis not present

## 2022-10-16 NOTE — Therapy (Addendum)
OUTPATIENT PHYSICAL THERAPY TREATMENT NOTE/Progress Note  Patient Name: Rachel Woodard MRN: [id number] DOB:15-Jul-1950, 72 y.o., female Today's Date: 10/16/2022  PCP: Collene Mares, [place]   REFERRING PROVIDER: Eugenia Mcalpine, MD  See note below for Objective Data and Assessment of Progress/Goals  Progress Note Reporting Period 08/24/22 to 10/16/22  See note below for Objective Data and Assessment of Progress/Goals.    .   END OF SESSION:   PT End of Session - 10/16/22 1227     Visit Number 20    Number of Visits 24    Date for PT Re-Evaluation 11/02/22    Authorization Type [employer] MEDICARE HMO;  MEDICAID [place] ACCESS    Authorization Time Period Approved 12 visits 09/04/22-10/20/22; 07/18/22-09/01/22 12    Authorization - Visit Number 18    Authorization - Number of Visits 24    Progress Note Due on Visit 20    PT Start Time 1148    PT Stop Time 1230    PT Time Calculation (min) 42 min    Activity Tolerance Patient tolerated treatment well    Behavior During Therapy WFL for tasks assessed/performed                         Past Medical History:  Diagnosis Date   Anemia 2001   after gastric bypass   Anxiety    Aortic atherosclerosis    Arthritis    Back pain    Bilateral swelling of feet    Bipolar 1 disorder    Breast cancer 2013   Breast cancer    Radiation therapy   Calcified granuloma of lung    Cigarette nicotine dependence    Cigarette nicotine dependence in remission    Cocaine dependence in remission    COPD (chronic obstructive pulmonary disease)    Depression    Disequilibrium    Fibromyalgia    GERD (gastroesophageal reflux disease)    HSV infection    Hypertension    Hypertensive kidney disease with stage 3a chronic kidney disease    Hypertensive kidney disease with stage 3b chronic kidney disease    Osteoarthritis    Personal history of infectious disease    Personal history of radiation therapy 2013   Pneumonia     Pre-diabetes    PTSD (post-traumatic stress disorder)    Seasonal allergic rhinitis due to pollen    Sleep apnea    uses CPAP   SOB (shortness of breath)    Vitamin D deficiency    Past Surgical History:  Procedure Laterality Date   ABDOMINAL HYSTERECTOMY  2018   BREAST LUMPECTOMY Right 2013   GASTRIC BYPASS  2001   TOTAL KNEE ARTHROPLASTY Left 12/22/2018   Procedure: LEFT TOTAL KNEE ARTHROPLASTY;  Surgeon: Tarry Kos, MD;  Location: [facility] OR;  Service: Orthopedics;  Laterality: Left;   Patient Active Problem List   Diagnosis Date Noted   Chronic kidney disease, stage 3a 09/18/2022   Pain due to onychomycosis of toenails of both feet 01/20/2020   Shortness of breath 10/05/2019   Status post total left knee replacement 12/22/2018   MDD (major depressive disorder), recurrent episode, mild 10/29/2018   Autoimmune hepatitis 10/29/2018   OSA (obstructive sleep apnea) 10/29/2018   Renal cyst, right 10/22/2018   Lung nodule, solitary 10/22/2018   Genital herpes simplex 10/29/2017   Vitamin D deficiency 10/29/2017   Morbid obesity 10/29/2017   Essential hypertension 10/29/2017   Prediabetes 02/14/2017  Fibromyalgia 02/14/2017   Substance abuse in remission 02/14/2017   Anxiety and depression 02/14/2017   HX: breast cancer 02/14/2017   Urge incontinence 11/13/2016   Unilateral primary osteoarthritis, left knee 06/14/2016   Hx of breast cancer 04/02/2016   GERD (gastroesophageal reflux disease) 11/02/2015   Angioedema 10/24/2013   Tobacco abuse 10/24/2013   DCIS (ductal carcinoma in situ) of breast 04/07/2013   Depression 04/07/2013    REFERRING DIAG: ICD-10:Z47.89: Encounter for other orthopedic aftercare. Left shoulder scope SAD/SCR/RCR  THERAPY DIAG:  Acute pain of left shoulder  Stiffness of left shoulder, not elsewhere classified  Muscle weakness (generalized)  Pain in right leg  Rationale for Evaluation and Treatment Rehabilitation  PERTINENT HISTORY: High BMI,  Arthritis, anxiety, COPD  PRECAUTIONS: Shoulder  07/03/22 Surgery   SUBJECTIVE:                                                                                                                                                                                    SUBJECTIVE STATEMENT:  Pt offered no concerns. No issues c the L UE functional use at home. PAIN:  Are you having pain? Yes: NPRS scale: 0/10 Pain location: L shoulder Pain description: ache, constant Aggravating factors: Dressing Relieving factors: Rest, hot shower, medication   OBJECTIVE: (objective measures completed at initial evaluation unless otherwise dated)   DIAGNOSTIC FINDINGS:  None recent for L shoulder in Epic   PATIENT SURVEYS:  FOTO: Perceived function   32%, predicted   58%; 08/22/22= 50%    COGNITION: Overall cognitive status: Within functional limits for tasks assessed                                  SENSATION: WFL   POSTURE: Forward head c rounded shoulders   UPPER EXTREMITY ROM:    Passive ROM Right eval Left eval Left 07/19/22 07/25/22 LT LT 07/26/22 LT 08/01/22 LT 08/15/22 LT 08/24/22 Lt 09/04/22  Shoulder flexion A140 P60 P80 P105 P115 P125 AA150 AA155 A100  Shoulder extension             Shoulder abduction A140 P60 P80 P80  100P  AA130   Shoulder adduction             Shoulder internal rotation             Shoulder external rotation A70 P40 P40 P50  P60 AA80 AA80   Elbow flexion             Elbow extension             Wrist flexion  Wrist extension             Wrist ulnar deviation             Wrist radial deviation             Wrist pronation             Wrist supination             (Blank rows = not tested)  AROM flex 09/06/22= 112d 09/14/22=130d. 10/02/22=130d min shrug, 10/09/22= 140d  UPPER EXTREMITY MMT:            NT MMT Right eval Left eval  Shoulder flexion      Shoulder extension      Shoulder abduction      Shoulder adduction      Shoulder internal rotation       Shoulder external rotation      Middle trapezius      Lower trapezius      Elbow flexion      Elbow extension      Wrist flexion      Wrist extension      Wrist ulnar deviation      Wrist radial deviation      Wrist pronation      Wrist supination      Grip strength (lbs)      (Blank rows = not tested)   SHOULDER SPECIAL TESTS: Impingement tests:  NT SLAP lesions:  NT Instability tests:  NT Rotator cuff assessment:  NT Biceps assessment:  NT   JOINT MOBILITY TESTING:  NT   PALPATION:  TTP of the L peri-shoulder         TODAY'S TREATMENT: [facility] Adult PT Treatment:                                                DATE: 10/16/22 Therapeutic Exercise: UBE L1 2 mins FWD, 2 mins BWD Flexion and scaption wall slide Above shoulder cabinet lifting 2# 2x10 Shoulder hor abd star pattern 2x12 RTB Shoulder ER 2x15 RTB Shoulder IR 2x15 GTB Standing shoulder row 2x15 GTB Standing shoulder row 2x15 GTB Standing serratus press 2x15 GTB  [facility] Adult PT Treatment:                                                DATE: 10/09/22 Therapeutic Exercise: UBE L1 2 mins FWD, 2 mins BWD Flexion and scaption wall slide Shoulder hor abd 2x12 red Above shoulder lifting 2# 1x10, 3# 1x8 to fatigue S/L L ER 3# 2x10 Supine Serratus chest press 5# bilat 2x10 Standing shoulder row 3x10 GTB Standing shoulder row 3x10 GTB  [facility] Adult PT Treatment:                                                DATE: 10/02/22 Therapeutic Exercise: UBE L1 2 mins FWD, 2 mins BWD Arm curls 2x15 4# Shoulder flexion and scaption to 90d  2x10 each min shrug  S/L shoulder ER 2x10 2# S/L shoulder abd 3x10 2# Supine shoulder circles 90d 3# Chest  pull 25# 3x10 Omega Lat pull 25# 3x10 Omega   PATIENT EDUCATION: Education details: Eval findings, POC, HEP, self care Person educated: Patient Education method: Explanation, Demonstration, Tactile cues, Verbal cues, and Handouts Education comprehension: verbalized  understanding, returned demonstration, verbal cues required, and tactile cues required   HOME EXERCISE PROGRAM: Access Code: [email or web address] URL: [email or web address] Date: 09/11/2022 Prepared by: Joellyn Rued  Exercises - Supine Shoulder External Rotation with Dowel  - 1 x daily - 7 x weekly - 1 sets - 10 reps - 3 hold - Supine Shoulder Flexion Extension AAROM with Dowel  - 1 x daily - 7 x weekly - 1 sets - 10 reps - 3 hold - Standing Shoulder Row with Anchored Resistance  - 1 x daily - 7 x weekly - 3 sets - 10 reps - 2 hold - Shoulder Extension with Resistance  - 1 x daily - 7 x weekly - 3 sets - 10 reps - 2 hold - Standing Serratus Punch with Resistance  - 1 x daily - 7 x weekly - 3 sets - 10 reps - 2 hold - Shoulder External Rotation with Anchored Resistance (Mirrored)  - 1 x daily - 7 x weekly - 3 sets - 10 reps - Shoulder Internal Rotation with Resistance (Mirrored)  - 1 x daily - 7 x weekly - 3 sets - 10 reps - Shoulder Flexion Wall Slide with Towel  - 1 x daily - 7 x weekl y - 1 sets - 10 reps - 5 hold - Shoulder Scaption Wall Slide with Towel  - 1 x daily - 7 x weekly - 1 sets - 10 reps - 5 hold  ASSESSMENT:   CLINICAL IMPRESSION: PT was completed  to progressive L shoulder RC and periscapular strengthening. AROM for L shoulder elevation continues to improve with the L equal to that of the R. Pt endorses good functional use of the L UE at home. Pt tolerated PT today without adverse effects. Pt will continue to benefit from skilled PT to address impairments for improved function.   OBJECTIVE IMPAIRMENTS: decreased activity tolerance, decreased ROM, decreased strength, impaired UE functional use, pain, and heavy arm .    ACTIVITY LIMITATIONS: carrying, lifting, sleeping, bed mobility, bathing, toileting, dressing, reach over head, and caring for others   PARTICIPATION LIMITATIONS: meal prep, cleaning, laundry, and driving   PERSONAL FACTORS: Age, Fitness, Past/current  experiences, Social background, and 1 comorbidity: High BMI-heavy arm  are also affecting patient's functional outcome.    REHAB POTENTIAL: Good   CLINICAL DECISION MAKING: Evolving/moderate complexity   EVALUATION COMPLEXITY: Moderate     GOALS:   SHORT TERM GOALS: Target date: 07/27/22   Pt will be Ind in an initial HEP  Baseline: Goal status:07/26/22=MET   LONG TERM GOALS: Target date: 11/02/22   Pt will be Ind in a final HEP to maintain achieved LOF Baseline: initiated Goal status: Ongoing   2.  Improve L shoulder PROM to flexion 140, abd 120, ER 50  Baseline: see flow sheet Status: 08/01/22=see flow sheets. 08/15/22=see flow sheet. 08/24/22= see flow sheet  Goal status: MET   3.  Pt's FOTO score will improved to the predicted value of 58% as indication of improved function by DC anticpated to be after 09/14/22 Baseline: 32% Status:=50% 09/27/22=57% Goal status: Improving   4.  Develop LTGs after 09/14/22 date when pt is able to particiapte in AROM and strengthening therex Baseline:  Goal status: MET 09/04/22  5.  Pt's will demonstrate  AROM of the L shoulder wihtin 90% of the R for good functional ROM  Baseline: See flow sheet Status: 10/16/22 L equal to R for flexion Goal status: Improved  6. Pt will demonstrate 4+/5 L shoulder strength for all motions for appropriate function of the L shoulder Baseline: NT Goal status: New as of 09/04/22  7. Pt will be able to lift 8# to shoulder height x5 for strength with functional activities at home Baseline: NT Goal status: New as of 09/04/22    PLAN:   PT FREQUENCY: 2x/week   PT DURATION: 8 weeks   PLANNED INTERVENTIONS: Therapeutic exercises, Therapeutic activity, Patient/Family education, Self Care, Joint mobilization, Aquatic Therapy, Dry Needling, Electrical stimulation, Cryotherapy, Moist heat, Taping, Vasopneumatic device, Ultrasound, Ionotophoresis 4mg /ml Dexamethasone, Manual therapy, and Re-evaluation   PLAN FOR NEXT  SESSION: Review FOTO; assess response to HEP; progress therex as indicated; use of modalities, manual therapy; and TPDN as indicated.   Jonique Kulig MS, PT 10/16/22 1:08 PM   Joellyn Rued MS, PT 10/23/22 9:37 PM

## 2022-10-19 ENCOUNTER — Ambulatory Visit: Payer: Medicare HMO | Admitting: Pulmonary Disease

## 2022-10-22 ENCOUNTER — Encounter: Payer: Self-pay | Admitting: Nurse Practitioner

## 2022-10-22 ENCOUNTER — Ambulatory Visit (INDEPENDENT_AMBULATORY_CARE_PROVIDER_SITE_OTHER): Payer: Medicare HMO | Admitting: Nurse Practitioner

## 2022-10-22 VITALS — BP 131/76 | HR 69 | Temp 98.0°F | Ht 66.0 in | Wt 254.0 lb

## 2022-10-22 DIAGNOSIS — Z6841 Body Mass Index (BMI) 40.0 and over, adult: Secondary | ICD-10-CM | POA: Diagnosis not present

## 2022-10-22 DIAGNOSIS — R600 Localized edema: Secondary | ICD-10-CM

## 2022-10-22 NOTE — Progress Notes (Signed)
Office: [phone]  /  Fax: [phone]  WEIGHT SUMMARY AND BIOMETRICS  No data recorded Weight Gained Since Last Visit: 3lb   Vitals Temp: 98 F (36.7 C) BP: 131/76 Pulse Rate: 69 SpO2: 98 %   Anthropometric Measurements Height: 5\' 6"  (1.676 m) Weight: 254 lb (115.2 kg) BMI (Calculated): 41.02 Weight at Last Visit: 251lb Weight Gained Since Last Visit: 3lb Starting Weight: 258lb Total Weight Loss (lbs): 4 lb (1.814 kg)   Body Composition  Body Fat %: 49.9 % Fat Mass (lbs): 127 lbs Muscle Mass (lbs): 121.2 lbs Total Body Water (lbs): 100 lbs Visceral Fat Rating : 17   Other Clinical Data Fasting: No Today's Visit #: 23 Starting Date: 03/14/21     HPI  Chief Complaint: OBESITY  Rachel Woodard is here to discuss her progress with her obesity treatment plan. She is on the the Category 3 Plan and states she is following her eating plan approximately 85 % of the time. She states she is exercising 30-45 minutes 4 days per week.   Interval History:  Since last office visit she has gained 3 pounds. She is going to PT one day per week for her shoulder.  She has started walking more over the past week.   She is drinking coffee, water and a protein shake daily.     Pharmacotherapy for weight loss: She is not currently taking medications  for medical weight loss.  Wasn't able to start [facility] due to cost.    Previous pharmacotherapy for medical weight loss:  She has tried OTC medications for weight loss.    Bariatric surgery:   status post gastric bypass by in [place] in 2001.  Her highest weight prior to surgery was 286 lbs and her nadir weight after surgery was 186 lbs.  Taking Vit D, Calcium and Vit B12.  She is not taking a multivitamin or iron.    LEE Has appt with PCP scheduled on May 7th.  Reports LEE, worse on left side since having left knee replacement.  Has appt scheduled with ortho May 17th for follow up on left knee.  Denies shortness of breath, chest pain  or palpitations.    PHYSICAL EXAM:  Blood pressure 131/76, pulse 69, temperature 98 F (36.7 C), height 5\' 6"  (1.676 m), weight 254 lb (115.2 kg), SpO2 98 %. Body mass index is 41 kg/m.  General: She is overweight, cooperative, alert, well developed, and in no acute distress. PSYCH: Has normal mood, affect and thought process.   Extremities: No edema.  Neurologic: No gross sensory or motor deficits. No tremors or fasciculations noted.    DIAGNOSTIC DATA REVIEWED:  BMET    Component Value Date/Time   NA 138 06/13/2022 1225   K 4.6 06/13/2022 1225   CL 99 06/13/2022 1225   CO2 25 06/13/2022 1225   GLUCOSE 100 (H) 06/13/2022 1225   GLUCOSE 115 (H) 12/23/2018 0521   BUN 27 06/13/2022 1225   CREATININE 1.18 (H) 06/13/2022 1225   CREATININE 0.95 08/08/2016 1102   CALCIUM 9.1 06/13/2022 1225   GFRNONAA 41 (L) 12/23/2018 0521   GFRAA 47 (L) 12/23/2018 0521   Lab Results  Component Value Date   HGBA1C 6.0 (H) 06/13/2022   HGBA1C 5.7 08/08/2016   Lab Results  Component Value Date   INSULIN 14.4 03/14/2021   Lab Results  Component Value Date   TSH 3.320 03/14/2021   CBC    Component Value Date/Time   WBC 7.6 03/14/2021 0844  WBC 8.7 12/23/2018 0521   RBC 4.63 03/14/2021 0844   RBC 4.34 12/23/2018 0521   HGB 13.3 03/14/2021 0844   HCT 40.5 03/14/2021 0844   PLT 255 03/14/2021 0844   MCV 88 03/14/2021 0844   MCH 28.7 03/14/2021 0844   MCH 27.9 12/23/2018 0521   MCHC 32.8 03/14/2021 0844   MCHC 31.9 12/23/2018 0521   RDW 14.5 03/14/2021 0844   Iron Studies    Component Value Date/Time   FERRITIN 17 06/13/2022 1225   Lipid Panel     Component Value Date/Time   CHOL 170 03/14/2021 0844   TRIG 99 03/14/2021 0844   HDL 53 03/14/2021 0844   CHOLHDL 3.1 07/11/2017 1107   CHOLHDL 3.0 08/08/2016 1102   VLDL 17 08/08/2016 1102   LDLCALC 99 03/14/2021 0844   Hepatic Function Panel     Component Value Date/Time   PROT 6.9 06/13/2022 1225   ALBUMIN 4.1  06/13/2022 1225   AST 13 06/13/2022 1225   ALT 17 06/13/2022 1225   ALKPHOS 114 06/13/2022 1225   BILITOT 0.2 06/13/2022 1225   BILIDIR 0.1 08/08/2016 1102   IBILI 0.3 08/08/2016 1102      Component Value Date/Time   TSH 3.320 03/14/2021 0844   Nutritional Lab Results  Component Value Date   VD25OH 36.9 06/13/2022   VD25OH 33.0 03/14/2021   VD25OH 27.7 (L) 04/24/2018     ASSESSMENT AND PLAN  TREATMENT PLAN FOR OBESITY:  Recommended Dietary Goals  Rachel Woodard is currently in the action stage of change. As such, her goal is to continue weight management plan. She has agreed to the Category 3 Plan.  Behavioral Intervention  We discussed the following Behavioral Modification Strategies today: increasing lean protein intake, decreasing simple carbohydrates , increasing vegetables, increasing lower glycemic fruits, increasing fiber rich foods, avoiding skipping meals, increasing water intake, continue to practice mindfulness when eating, and planning for success.  Additional resources provided today: NA  Recommended Physical Activity Goals  Rachel Woodard has been advised to work up to 150 minutes of moderate intensity aerobic activity a week and strengthening exercises 2-3 times per week for cardiovascular health, weight loss maintenance and preservation of muscle mass.   She has agreed to Continue current level of physical activity    Pharmacotherapy We discussed various medication options to help Rachel Woodard with her weight loss efforts and we both agreed to to consider her options.  She is limited due to cost and comorbidites.  I feel strongly that she would benefit from starting [facility] due to HTN, OSAS, history of heart murmur and pre diabetes. See SELECT study.    ASSOCIATED CONDITIONS ADDRESSED TODAY  Action/Plan  Lower extremity edema Keep follow up appt with PCP.    Morbid obesity (HCC)  BMI 40.0-44.9, adult [facility])      Has appt with PCP in May for follow up and labs.      Return in about 3 weeks (around 11/12/2022).Marland Kitchen She was informed of the importance of frequent follow up visits to maximize her success with intensive lifestyle modifications for her multiple health conditions.   ATTESTASTION STATEMENTS:  Reviewed by clinician on day of visit: allergies, medications, problem list, medical history, surgical history, family history, social history, and previous encounter notes.   Time spent on visit including pre-visit chart review and post-visit care and charting was 30 minutes.    Theodis Sato. Gehrig Patras FNP-C

## 2022-10-23 ENCOUNTER — Ambulatory Visit: Payer: Medicare HMO

## 2022-10-23 DIAGNOSIS — M6281 Muscle weakness (generalized): Secondary | ICD-10-CM | POA: Diagnosis not present

## 2022-10-23 DIAGNOSIS — M79604 Pain in right leg: Secondary | ICD-10-CM | POA: Diagnosis not present

## 2022-10-23 DIAGNOSIS — M25512 Pain in left shoulder: Secondary | ICD-10-CM | POA: Diagnosis not present

## 2022-10-23 DIAGNOSIS — M25612 Stiffness of left shoulder, not elsewhere classified: Secondary | ICD-10-CM | POA: Diagnosis not present

## 2022-10-23 NOTE — Therapy (Signed)
OUTPATIENT PHYSICAL THERAPY TREATMENT NOTE/Progress Note  Patient Name: Rachel Woodard MRN: [id number] DOB:05/26/1951, 72 y.o., female Today's Date: 10/16/2022  PCP: Collene Mares, [place]   REFERRING PROVIDER: Eugenia Mcalpine, MD  See note below for Objective Data and Assessment of Progress/Goals   .   END OF SESSION:   PT End of Session - 10/16/22 1227     Visit Number 20    Number of Visits 24    Date for PT Re-Evaluation 11/02/22    Authorization Type [employer] MEDICARE HMO;  MEDICAID [place] ACCESS    Authorization Time Period Approved 12 visits 09/04/22-10/20/22; 07/18/22-09/01/22 12    Authorization - Visit Number 18    Authorization - Number of Visits 24    Progress Note Due on Visit 20    PT Start Time 1148    PT Stop Time 1230    PT Time Calculation (min) 42 min    Activity Tolerance Patient tolerated treatment well    Behavior During Therapy WFL for tasks assessed/performed                         Past Medical History:  Diagnosis Date   Anemia 2001   after gastric bypass   Anxiety    Aortic atherosclerosis    Arthritis    Back pain    Bilateral swelling of feet    Bipolar 1 disorder    Breast cancer 2013   Breast cancer    Radiation therapy   Calcified granuloma of lung    Cigarette nicotine dependence    Cigarette nicotine dependence in remission    Cocaine dependence in remission    COPD (chronic obstructive pulmonary disease)    Depression    Disequilibrium    Fibromyalgia    GERD (gastroesophageal reflux disease)    HSV infection    Hypertension    Hypertensive kidney disease with stage 3a chronic kidney disease    Hypertensive kidney disease with stage 3b chronic kidney disease    Osteoarthritis    Personal history of infectious disease    Personal history of radiation therapy 2013   Pneumonia    Pre-diabetes    PTSD (post-traumatic stress disorder)    Seasonal allergic rhinitis due to pollen    Sleep apnea    uses  CPAP   SOB (shortness of breath)    Vitamin D deficiency    Past Surgical History:  Procedure Laterality Date   ABDOMINAL HYSTERECTOMY  2018   BREAST LUMPECTOMY Right 2013   GASTRIC BYPASS  2001   TOTAL KNEE ARTHROPLASTY Left 12/22/2018   Procedure: LEFT TOTAL KNEE ARTHROPLASTY;  Surgeon: Tarry Kos, MD;  Location: [facility] OR;  Service: Orthopedics;  Laterality: Left;   Patient Active Problem List   Diagnosis Date Noted   Chronic kidney disease, stage 3a 09/18/2022   Pain due to onychomycosis of toenails of both feet 01/20/2020   Shortness of breath 10/05/2019   Status post total left knee replacement 12/22/2018   MDD (major depressive disorder), recurrent episode, mild 10/29/2018   Autoimmune hepatitis 10/29/2018   OSA (obstructive sleep apnea) 10/29/2018   Renal cyst, right 10/22/2018   Lung nodule, solitary 10/22/2018   Genital herpes simplex 10/29/2017   Vitamin D deficiency 10/29/2017   Morbid obesity 10/29/2017   Essential hypertension 10/29/2017   Prediabetes 02/14/2017   Fibromyalgia 02/14/2017   Substance abuse in remission 02/14/2017   Anxiety and depression 02/14/2017   HX: breast cancer  02/14/2017   Urge incontinence 11/13/2016   Unilateral primary osteoarthritis, left knee 06/14/2016   Hx of breast cancer 04/02/2016   GERD (gastroesophageal reflux disease) 11/02/2015   Angioedema 10/24/2013   Tobacco abuse 10/24/2013   DCIS (ductal carcinoma in situ) of breast 04/07/2013   Depression 04/07/2013    REFERRING DIAG: ICD-10:Z47.89: Encounter for other orthopedic aftercare. Left shoulder scope SAD/SCR/RCR  THERAPY DIAG:  Acute pain of left shoulder  Stiffness of left shoulder, not elsewhere classified  Muscle weakness (generalized)  Pain in right leg  Rationale for Evaluation and Treatment Rehabilitation  PERTINENT HISTORY: High BMI, Arthritis, anxiety, COPD  PRECAUTIONS: Shoulder  07/03/22 Surgery   SUBJECTIVE:                                                                                                                                                                                     SUBJECTIVE STATEMENT:  Pt reports her L shoulder was sore for 3 days after completing the overhead shoulder lifts, but it did resolve. PAIN:  Are you having pain? Yes: NPRS scale: 1/10 Pain location: L shoulder Pain description: ache, constant Aggravating factors: Dressing Relieving factors: Rest, hot shower, medication   OBJECTIVE: (objective measures completed at initial evaluation unless otherwise dated)   DIAGNOSTIC FINDINGS:  None recent for L shoulder in Epic   PATIENT SURVEYS:  FOTO: Perceived function   32%, predicted   58%; 08/22/22= 50%    COGNITION: Overall cognitive status: Within functional limits for tasks assessed                                  SENSATION: WFL   POSTURE: Forward head c rounded shoulders   UPPER EXTREMITY ROM:    Passive ROM Right eval Left eval Left 07/19/22 07/25/22 LT LT 07/26/22 LT 08/01/22 LT 08/15/22 LT 08/24/22 Lt 09/04/22  Shoulder flexion A140 P60 P80 P105 P115 P125 AA150 AA155 A100  Shoulder extension             Shoulder abduction A140 P60 P80 P80  100P  AA130   Shoulder adduction             Shoulder internal rotation             Shoulder external rotation A70 P40 P40 P50  P60 AA80 AA80   Elbow flexion             Elbow extension             Wrist flexion             Wrist extension  Wrist ulnar deviation             Wrist radial deviation             Wrist pronation             Wrist supination             (Blank rows = not tested)  AROM flex 09/06/22= 112d 09/14/22=130d. 10/02/22=130d min shrug, 10/09/22= 140d  UPPER EXTREMITY MMT:            NT MMT Right eval Left eval  Shoulder flexion      Shoulder extension      Shoulder abduction      Shoulder adduction      Shoulder internal rotation      Shoulder external rotation      Middle trapezius      Lower trapezius       Elbow flexion      Elbow extension      Wrist flexion      Wrist extension      Wrist ulnar deviation      Wrist radial deviation      Wrist pronation      Wrist supination      Grip strength (lbs)      (Blank rows = not tested)   SHOULDER SPECIAL TESTS: Impingement tests:  NT SLAP lesions:  NT Instability tests:  NT Rotator cuff assessment:  NT Biceps assessment:  NT   JOINT MOBILITY TESTING:  NT   PALPATION:  TTP of the L peri-shoulder         TODAY'S TREATMENT: [facility] Adult PT Treatment:                                                DATE: 10/23/22 Therapeutic Exercise: Pulleys for shoulder flexion and scaption S/L shoulder ER 3x10 3# S/L shoulder abd 3x10 3# Supine protraction 2x10 3# Shoulder flexion 2x10 60d trunk angle Shoulder flexion and scaption x10 each to 90d 1# Prone shoulder I 1#, T 1#. Y 2x10 Supine shoulder stability at 90d c perturbations  [facility] Adult PT Treatment:                                                DATE: 10/16/22 Therapeutic Exercise: UBE L1 2 mins FWD, 2 mins BWD Flexion and scaption wall slide Above shoulder cabinet lifting 2# 2x10 Shoulder hor abd star pattern 2x12 RTB Shoulder ER 2x15 RTB Shoulder IR 2x15 GTB Standing shoulder row 2x15 GTB Standing shoulder row 2x15 GTB Standing serratus press 2x15 GTB  [facility] Adult PT Treatment:                                                DATE: 10/09/22 Therapeutic Exercise: UBE L1 2 mins FWD, 2 mins BWD Flexion and scaption wall slide Shoulder hor abd 2x12 red Above shoulder lifting 2# 1x10, 3# 1x8 to fatigue S/L L ER 3# 2x10 Supine Serratus chest press 5# bilat 2x10 Standing shoulder row 3x10 GTB Standing shoulder row 3x10 GTB  [facility] Adult PT Treatment:  DATE: 10/02/22 Therapeutic Exercise: UBE L1 2 mins FWD, 2 mins BWD Arm curls 2x15 4# Shoulder flexion and scaption to 90d  2x10 each min shrug  S/L shoulder ER 2x10 2# S/L  shoulder abd 3x10 2# Supine shoulder circles 90d 3# Chest pull 25# 3x10 Omega Lat pull 25# 3x10 Omega   PATIENT EDUCATION: Education details: Eval findings, POC, HEP, self care Person educated: Patient Education method: Explanation, Demonstration, Tactile cues, Verbal cues, and Handouts Education comprehension: verbalized understanding, returned demonstration, verbal cues required, and tactile cues required   HOME EXERCISE PROGRAM: Access Code: [email or web address] URL: [email or web address] Date: 09/11/2022 Prepared by: Joellyn Rued  Exercises - Supine Shoulder External Rotation with Dowel  - 1 x daily - 7 x weekly - 1 sets - 10 reps - 3 hold - Supine Shoulder Flexion Extension AAROM with Dowel  - 1 x daily - 7 x weekly - 1 sets - 10 reps - 3 hold - Standing Shoulder Row with Anchored Resistance  - 1 x daily - 7 x weekly - 3 sets - 10 reps - 2 hold - Shoulder Extension with Resistance  - 1 x daily - 7 x weekly - 3 sets - 10 reps - 2 hold - Standing Serratus Punch with Resistance  - 1 x daily - 7 x weekly - 3 sets - 10 reps - 2 hold - Shoulder External Rotation with Anchored Resistance (Mirrored)  - 1 x daily - 7 x weekly - 3 sets - 10 reps - Shoulder Internal Rotation with Resistance (Mirrored)  - 1 x daily - 7 x weekly - 3 sets - 10 reps - Shoulder Flexion Wall Slide with Towel  - 1 x daily - 7 x weekl y - 1 sets - 10 reps - 5 hold - Shoulder Scaption Wall Slide with Towel  - 1 x daily - 7 x weekly - 1 sets - 10 reps - 5 hold  ASSESSMENT:   CLINICAL IMPRESSION: Pt participated in PT for progressive L shoulder RC and periscapular strengthening. Small increases in resisitance were made for the therex. Pt tolerated the prescribed therex without adverse effects. Pt will continue to benefit from skilled PT to address impairments for improved function. Reassess FOTO the next PT session.  OBJECTIVE IMPAIRMENTS: decreased activity tolerance, decreased ROM, decreased strength, impaired UE  functional use, pain, and heavy arm .    ACTIVITY LIMITATIONS: carrying, lifting, sleeping, bed mobility, bathing, toileting, dressing, reach over head, and caring for others   PARTICIPATION LIMITATIONS: meal prep, cleaning, laundry, and driving   PERSONAL FACTORS: Age, Fitness, Past/current experiences, Social background, and 1 comorbidity: High BMI-heavy arm  are also affecting patient's functional outcome.    REHAB POTENTIAL: Good   CLINICAL DECISION MAKING: Evolving/moderate complexity   EVALUATION COMPLEXITY: Moderate     GOALS:   SHORT TERM GOALS: Target date: 07/27/22   Pt will be Ind in an initial HEP  Baseline: Goal status:07/26/22=MET   LONG TERM GOALS: Target date: 11/02/22   Pt will be Ind in a final HEP to maintain achieved LOF Baseline: initiated Goal status: Ongoing   2.  Improve L shoulder PROM to flexion 140, abd 120, ER 50  Baseline: see flow sheet Status: 08/01/22=see flow sheets. 08/15/22=see flow sheet. 08/24/22= see flow sheet  Goal status: MET   3.  Pt's FOTO score will improved to the predicted value of 58% as indication of improved function by DC anticpated to be after 09/14/22 Baseline: 32% Status:=50% 09/27/22=57% Goal status: Improving  4.  Develop LTGs after 09/14/22 date when pt is able to particiapte in AROM and strengthening therex Baseline:  Goal status: MET 09/04/22  5.  Pt's will demonstrate  AROM of the L shoulder within 90% of the R for good functional ROM  Baseline: See flow sheet Status: 10/16/22 L equal to R for flexion Goal status: Improved  6. Pt will demonstrate 4+/5 L shoulder strength for all motions for appropriate function of the L shoulder Baseline: NT Goal status: New as of 09/04/22  7. Pt will be able to lift 8# to shoulder height x5 for strength with functional activities at home Baseline: NT Goal status: New as of 09/04/22    PLAN:   PT FREQUENCY: 2x/week   PT DURATION: 8 weeks   PLANNED INTERVENTIONS: Therapeutic  exercises, Therapeutic activity, Patient/Family education, Self Care, Joint mobilization, Aquatic Therapy, Dry Needling, Electrical stimulation, Cryotherapy, Moist heat, Taping, Vasopneumatic device, Ultrasound, Ionotophoresis 4mg /ml Dexamethasone, Manual therapy, and Re-evaluation   PLAN FOR NEXT SESSION: Review FOTO; assess response to HEP; progress therex as indicated; use of modalities, manual therapy; and TPDN as indicated.   Kahle Mcqueen MS, PT 10/16/22 1:08 PM

## 2022-10-24 DIAGNOSIS — F3131 Bipolar disorder, current episode depressed, mild: Secondary | ICD-10-CM | POA: Diagnosis not present

## 2022-10-25 ENCOUNTER — Ambulatory Visit: Payer: Medicare HMO

## 2022-10-26 ENCOUNTER — Ambulatory Visit
Admission: RE | Admit: 2022-10-26 | Discharge: 2022-10-26 | Disposition: A | Discharge status: Home / Self Care | Payer: Medicare HMO | Source: Ambulatory Visit | Attending: Internal Medicine | Admitting: Internal Medicine

## 2022-10-26 DIAGNOSIS — F339 Major depressive disorder, recurrent, unspecified: Secondary | ICD-10-CM | POA: Diagnosis not present

## 2022-10-26 DIAGNOSIS — Z1231 Encounter for screening mammogram for malignant neoplasm of breast: Secondary | ICD-10-CM | POA: Diagnosis not present

## 2022-10-30 DIAGNOSIS — I10 Essential (primary) hypertension: Secondary | ICD-10-CM | POA: Diagnosis not present

## 2022-10-30 DIAGNOSIS — Z6841 Body Mass Index (BMI) 40.0 and over, adult: Secondary | ICD-10-CM | POA: Diagnosis not present

## 2022-10-30 DIAGNOSIS — R911 Solitary pulmonary nodule: Secondary | ICD-10-CM | POA: Diagnosis not present

## 2022-10-30 DIAGNOSIS — Z23 Encounter for immunization: Secondary | ICD-10-CM | POA: Diagnosis not present

## 2022-10-30 NOTE — Therapy (Addendum)
OUTPATIENT PHYSICAL THERAPY TREATMENT NOTE/Discharge  Patient Name: Rachel Woodard MRN: [id number] DOB:1950/08/27, 72 y.o., female Today's Date: 11/01/2022  PCP: Collene Mares, [place]   REFERRING PROVIDER: Eugenia Mcalpine, MD  See note below for Objective Data and Assessment of Progress/Goals   .   END OF SESSION:   PT End of Session - 11/01/22 1107     Visit Number 22    Number of Visits 24    Date for PT Re-Evaluation 11/02/22    Authorization Type [employer] MEDICARE HMO;  MEDICAID [place] ACCESS    Authorization Time Period Approved 12 visits 09/04/22-10/20/22; 07/18/22-09/01/22 12    Authorization - Visit Number 20    Authorization - Number of Visits 24    Progress Note Due on Visit 20    PT Start Time 1104    PT Stop Time 1148    PT Time Calculation (min) 44 min    Activity Tolerance Patient tolerated treatment well    Behavior During Therapy WFL for tasks assessed/performed                          Past Medical History:  Diagnosis Date   Anemia 2001   after gastric bypass   Anxiety    Aortic atherosclerosis (HCC)    Arthritis    Back pain    Bilateral swelling of feet    Bipolar 1 disorder (HCC)    Breast cancer (HCC) 2013   Breast cancer    Radiation therapy   Calcified granuloma of lung (HCC)    Cigarette nicotine dependence    Cigarette nicotine dependence in remission    Cocaine dependence in remission (HCC)    COPD (chronic obstructive pulmonary disease) (HCC)    Depression    Disequilibrium    Fibromyalgia    GERD (gastroesophageal reflux disease)    HSV infection    Hypertension    Hypertensive kidney disease with stage 3a chronic kidney disease (HCC)    Hypertensive kidney disease with stage 3b chronic kidney disease (HCC)    Osteoarthritis    Personal history of infectious disease    Personal history of radiation therapy 2013   Pneumonia    Pre-diabetes    PTSD (post-traumatic stress disorder)    Seasonal allergic  rhinitis due to pollen    Sleep apnea    uses CPAP   SOB (shortness of breath)    Vitamin D deficiency    Past Surgical History:  Procedure Laterality Date   ABDOMINAL HYSTERECTOMY  2018   BREAST LUMPECTOMY Right 2013   GASTRIC BYPASS  2001   TOTAL KNEE ARTHROPLASTY Left 12/22/2018   Procedure: LEFT TOTAL KNEE ARTHROPLASTY;  Surgeon: Tarry Kos, MD;  Location: [facility] OR;  Service: Orthopedics;  Laterality: Left;   Patient Active Problem List   Diagnosis Date Noted   Chronic kidney disease, stage 3a (HCC) 09/18/2022   Pain due to onychomycosis of toenails of both feet 01/20/2020   Shortness of breath 10/05/2019   Status post total left knee replacement 12/22/2018   MDD (major depressive disorder), recurrent episode, mild (HCC) 10/29/2018   Autoimmune hepatitis (HCC) 10/29/2018   OSA (obstructive sleep apnea) 10/29/2018   Renal cyst, right 10/22/2018   Lung nodule, solitary 10/22/2018   Genital herpes simplex 10/29/2017   Vitamin D deficiency 10/29/2017   Morbid obesity (HCC) 10/29/2017   Essential hypertension 10/29/2017   Prediabetes 02/14/2017   Fibromyalgia 02/14/2017   Substance abuse in remission (  HCC) 02/14/2017   Anxiety and depression 02/14/2017   HX: breast cancer 02/14/2017   Urge incontinence 11/13/2016   Unilateral primary osteoarthritis, left knee 06/14/2016   Hx of breast cancer 04/02/2016   GERD (gastroesophageal reflux disease) 11/02/2015   Angioedema 10/24/2013   Tobacco abuse 10/24/2013   DCIS (ductal carcinoma in situ) of breast 04/07/2013   Depression 04/07/2013    REFERRING DIAG: ICD-10:Z47.89: Encounter for other orthopedic aftercare. Left shoulder scope SAD/SCR/RCR  THERAPY DIAG:  Acute pain of left shoulder  Stiffness of left shoulder, not elsewhere classified  Muscle weakness (generalized)  Rationale for Evaluation and Treatment Rehabilitation  PERTINENT HISTORY: High BMI, Arthritis, anxiety, COPD  PRECAUTIONS: Shoulder  07/03/22 Surgery    SUBJECTIVE:                                                                                                                                                                                    SUBJECTIVE STATEMENT:  Pt reports having good use of her L shoulder UE. PAIN:  Are you having pain? Yes: NPRS scale: 2/10 Pain location: L shoulder Pain description: ache, constant Aggravating factors: Dressing Relieving factors: Rest, hot shower, medication   OBJECTIVE: (objective measures completed at initial evaluation unless otherwise dated)   DIAGNOSTIC FINDINGS:  None recent for L shoulder in Epic   PATIENT SURVEYS:  FOTO: Perceived function   32%, predicted   58%; 08/22/22= 50%    COGNITION: Overall cognitive status: Within functional limits for tasks assessed                                  SENSATION: WFL   POSTURE: Forward head c rounded shoulders   UPPER EXTREMITY ROM:    Passive ROM Right eval Left eval Left 07/19/22 07/25/22 LT LT 07/26/22 LT 08/01/22 LT 08/15/22 LT 08/24/22 Lt 09/04/22  Shoulder flexion A140 P60 P80 P105 P115 P125 AA150 AA155 A100  Shoulder extension             Shoulder abduction A140 P60 P80 P80  100P  AA130   Shoulder adduction             Shoulder internal rotation             Shoulder external rotation A70 P40 P40 P50  P60 AA80 AA80   Elbow flexion             Elbow extension             Wrist flexion             Wrist extension  Wrist ulnar deviation             Wrist radial deviation             Wrist pronation             Wrist supination             (Blank rows = not tested)  AROM flex 09/06/22= 112d 09/14/22=130d. 10/02/22=130d min shrug, 10/09/22= 140d; 11/01/22=140d  UPPER EXTREMITY MMT:            NT MMT Right eval Left eval LT 11/01/22  Shoulder flexion     4+  Shoulder extension     5  Shoulder abduction     4+  Shoulder adduction     5  Shoulder internal rotation     5  Shoulder external rotation     4+  Middle  trapezius       Lower trapezius       Elbow flexion       Elbow extension       Wrist flexion       Wrist extension       Wrist ulnar deviation       Wrist radial deviation       Wrist pronation       Wrist supination       Grip strength (lbs)       (Blank rows = not tested)   SHOULDER SPECIAL TESTS: Impingement tests:  NT SLAP lesions:  NT Instability tests:  NT Rotator cuff assessment:  NT Biceps assessment:  NT   JOINT MOBILITY TESTING:  NT   PALPATION:  TTP of the L peri-shoulder         TODAY'S TREATMENT: [facility] Adult PT Treatment:                                                DATE: 11/01/22 Therapeutic Exercise: UBE L1 2 mins FWD, 2 mins BWD Shoulder and overhead lifts 1-4# Shoulder ER x15 RTB Shoulder IR x15 RTB Standing shoulder row 2x15 GTB Standing shoulder row 2x15 GTB Standing serratus press 2x15 GTB Final HEP Therapeutic Activity: FOTO completed and reviewed  [facility] Adult PT Treatment:                                                DATE: 10/23/22 Therapeutic Exercise: Pulleys for shoulder flexion and scaption S/L shoulder ER 3x10 3# S/L shoulder abd 3x10 3# Supine protraction 2x10 3# Shoulder flexion 2x10 60d trunk angle Shoulder flexion and scaption x10 each to 90d 1# Prone shoulder I 1#, T 1#. Y 2x10 Supine shoulder stability at 90d c perturbations  [facility] Adult PT Treatment:                                                DATE: 10/16/22 Therapeutic Exercise: UBE L1 2 mins FWD, 2 mins BWD Flexion and scaption wall slide Above shoulder cabinet lifting 2# 2x10 Shoulder hor abd star pattern 2x12 RTB Shoulder ER 2x15 RTB Shoulder IR 2x15 GTB Standing shoulder row 2x15 GTB  Standing shoulder row 2x15 GTB Standing serratus press 2x15 GTB   PATIENT EDUCATION: Education details: Eval findings, POC, HEP, self care Person educated: Patient Education method: Explanation, Demonstration, Tactile cues, Verbal cues, and Handouts Education  comprehension: verbalized understanding, returned demonstration, verbal cues required, and tactile cues required   HOME EXERCISE PROGRAM: Access Code: [email or web address] URL: [email or web address] Date: 09/11/2022 Prepared by: Joellyn Rued  Exercises - Supine Shoulder External Rotation with Dowel  - 1 x daily - 7 x weekly - 1 sets - 10 reps - 3 hold - Supine Shoulder Flexion Extension AAROM with Dowel  - 1 x daily - 7 x weekly - 1 sets - 10 reps - 3 hold - Standing Shoulder Row with Anchored Resistance  - 1 x daily - 7 x weekly - 3 sets - 10 reps - 2 hold - Shoulder Extension with Resistance  - 1 x daily - 7 x weekly - 3 sets - 10 reps - 2 hold - Standing Serratus Punch with Resistance  - 1 x daily - 7 x weekly - 3 sets - 10 reps - 2 hold - Shoulder External Rotation with Anchored Resistance (Mirrored)  - 1 x daily - 7 x weekly - 3 sets - 10 reps - Shoulder Internal Rotation with Resistance (Mirrored)  - 1 x daily - 7 x weekly - 3 sets - 10 reps - Shoulder Flexion Wall Slide with Towel  - 1 x daily - 7 x weekl y - 1 sets - 10 reps - 5 hold - Shoulder Scaption Wall Slide with Towel  - 1 x daily - 7 x weekly - 1 sets - 10 reps - 5 hold  ASSESSMENT:   CLINICAL IMPRESSION: Pt completed her last PT session today following a L shoulder RCR. Pt has made very good progress re: pain, ROM, strength and function. The majority of PT established goals were met, with one other improved. Pt is Ind in a final HEP to maintain or progress her QOL. Pt is in agreement with DC at this time  OBJECTIVE IMPAIRMENTS: decreased activity tolerance, decreased ROM, decreased strength, impaired UE functional use, pain, and heavy arm .    ACTIVITY LIMITATIONS: carrying, lifting, sleeping, bed mobility, bathing, toileting, dressing, reach over head, and caring for others   PARTICIPATION LIMITATIONS: meal prep, cleaning, laundry, and driving   PERSONAL FACTORS: Age, Fitness, Past/current experiences, Social  background, and 1 comorbidity: High BMI-heavy arm  are also affecting patient's functional outcome.    REHAB POTENTIAL: Good   CLINICAL DECISION MAKING: Evolving/moderate complexity   EVALUATION COMPLEXITY: Moderate     GOALS:   SHORT TERM GOALS: Target date: 07/27/22   Pt will be Ind in an initial HEP  Baseline: Goal status:07/26/22=MET   LONG TERM GOALS: Target date: 11/02/22   Pt will be Ind in a final HEP to maintain achieved LOF Baseline: initiated Goal status: MET   2.  Improve L shoulder PROM to flexion 140, abd 120, ER 50  Baseline: see flow sheet Status: 08/01/22=see flow sheets. 08/15/22=see flow sheet. 08/24/22= see flow sheet  Goal status: MET   3.  Pt's FOTO score will improved to the predicted value of 58% as indication of improved function by DC anticpated to be after 09/14/22 Baseline: 32% Status:=50% 09/27/22=57%; 11/01/22=66% Goal status: MET   4.  Develop LTGs after 09/14/22 date when pt is able to particiapte in AROM and strengthening therex Baseline:  Goal status: MET 09/04/22  5.  Pt's will demonstrate  AROM of  the L shoulder within 90% of the R for good functional ROM  Baseline: See flow sheet Status: 10/16/22 L equal to R for flexion. 11/01/22= L flexion = to R Goal status: MET  6. Pt will demonstrate 4+/5 L shoulder strength for all motions for appropriate function of the L shoulder Baseline: NT Status: see flow sheets Goal status: MET  7. Pt will be able to lift 8# to shoulder height x5 for strength with functional activities at home Baseline: NT Status: 4# Goal status: Not met, improved    PLAN:   PT FREQUENCY: 2x/week   PT DURATION: 8 weeks   PLANNED INTERVENTIONS: Therapeutic exercises, Therapeutic activity, Patient/Family education, Self Care, Joint mobilization, Aquatic Therapy, Dry Needling, Electrical stimulation, Cryotherapy, Moist heat, Taping, Vasopneumatic device, Ultrasound, Ionotophoresis 4mg /ml Dexamethasone, Manual therapy, and  Re-evaluation   PLAN FOR NEXT SESSION: Review FOTO; assess response to HEP; progress therex as indicated; use of modalities, manual therapy; and TPDN as indicated.  PHYSICAL THERAPY DISCHARGE SUMMARY  Visits from Start of Care: 22  Current functional level related to goals / functional outcomes: See clinical impression and PT goals    Remaining deficits: See clinical impression and PT goals   Education / Equipment: HEP   Patient agrees to discharge. Patient goals were  PT goals were MET or improved . Patient is being discharged due to being pleased with the current functional level.    Jaydian Santana MS, PT 11/01/22 6:10 PM   Referring diagnosis?ICD-10:Z47.89: Encounter for other orthopedic aftercare. Left shoulder scope SAD/SCR/RCR  Treatment diagnosis? (if different than referring diagnosis) Acute pain of left shoulder, Stiffness of left shoulder, not elsewhere classified, Muscle weakness (generalized)  What was this (referring dx) caused by? [x]  Surgery []  Fall []  Ongoing issue []  Arthritis []  Other: ____________  Laterality: []  Rt [x]  Lt []  Both  Check all possible CPT codes:  *CHOOSE 10 OR LESS*    [x]  16109 (Therapeutic Exercise)  []  92507 (SLP Treatment)  []  60454 (Neuro Re-ed)   []  92526 (Swallowing Treatment)   []  97116 (Gait Training)   []  [id number] (Cognitive Training, 1st 15 minutes) [x]  97140 (Manual Therapy)   []  97130 (Cognitive Training, each add'l 15 minutes)  [x]  97164 (Re-evaluation)                              []  Other, List CPT Code ____________  [x]  97530 (Therapeutic Activities)     [x]  97535 (Self Care)   []  All codes above (97110 - 97535)  []  97012 (Mechanical Traction)  []  97014 (E-stim Unattended)  []  97032 (E-stim manual)  []  97033 (Ionto)  []  97035 (Ultrasound) []  97750 (Physical Performance Training) []  [id number] (Aquatic Therapy) []  97016 (Vasopneumatic Device) []  97018 (Paraffin) []  97034 (Contrast Bath) []  97597 (Wound Care 1st 20 sq  cm) []  97598 (Wound Care each add'l 20 sq cm) []  97760 (Orthotic Fabrication, Fitting, Training Initial) []  [id number] (Prosthetic Management and Training Initial) []  [id number] (Orthotic or Prosthetic Training/ Modification Subsequent)   Kalub Morillo MS, PT 11/02/22 8:58 AM

## 2022-11-01 ENCOUNTER — Ambulatory Visit: Payer: Medicare HMO | Attending: Specialist

## 2022-11-01 DIAGNOSIS — M25612 Stiffness of left shoulder, not elsewhere classified: Secondary | ICD-10-CM | POA: Insufficient documentation

## 2022-11-01 DIAGNOSIS — M6281 Muscle weakness (generalized): Secondary | ICD-10-CM | POA: Insufficient documentation

## 2022-11-01 DIAGNOSIS — M25512 Pain in left shoulder: Secondary | ICD-10-CM | POA: Insufficient documentation

## 2022-11-02 DIAGNOSIS — N3942 Incontinence without sensory awareness: Secondary | ICD-10-CM | POA: Diagnosis not present

## 2022-11-05 ENCOUNTER — Encounter: Payer: Self-pay | Admitting: Pulmonary Disease

## 2022-11-05 ENCOUNTER — Ambulatory Visit (INDEPENDENT_AMBULATORY_CARE_PROVIDER_SITE_OTHER): Payer: Medicare HMO | Admitting: Pulmonary Disease

## 2022-11-05 VITALS — BP 112/68 | HR 83 | Ht 66.0 in | Wt 259.4 lb

## 2022-11-05 DIAGNOSIS — G4733 Obstructive sleep apnea (adult) (pediatric): Secondary | ICD-10-CM

## 2022-11-05 DIAGNOSIS — R911 Solitary pulmonary nodule: Secondary | ICD-10-CM | POA: Diagnosis not present

## 2022-11-05 DIAGNOSIS — I10 Essential (primary) hypertension: Secondary | ICD-10-CM | POA: Diagnosis not present

## 2022-11-05 NOTE — Progress Notes (Signed)
Rachel Woodard    [id number]    July 15, 1950  Primary Care Physician:Miller, Deloria Lair, [place]  Referring Physician: Collene Mares, [place] [address] [place],  [place] [address]  Chief complaint:   History of obstructive sleep apnea History of chronic obstructive pulmonary disease  HPI:  She continues to try to stay active  She did follow-up with Dr. Myrtis Ser of dentistry She does wear an oral device at present Wakes up feeling like she is at a good nights rest Usually goes to bed between 11 and 12 Falls asleep soon after, she does use Seroquel to help Wakes up between 7 and 8 AM Feels rested when she wakes up in the morning  She does sometimes take naps during the day may last up to 2 hours sometimes  Her breathing has remained quite good She tries to walk on a regular basis sometimes walking up to 2 miles Occasional knee pain  She also has some issues with water weight recently for which hydrochlorothiazide was increased  Otherwise she feels well overall  Unable to tolerate CPAP   Outpatient Encounter Medications as of 11/05/2022  Medication Sig   buPROPion (WELLBUTRIN XL) 150 MG 24 hr tablet Take 150 mg by mouth every morning.   calcium carbonate (OS-CAL) 600 MG TABS tablet Take 600 mg by mouth 2 (two) times daily with a meal.   carvedilol (COREG) 3.125 MG tablet TAKE 1 TABLET (3.125 MG TOTAL) BY MOUTH 2 (TWO) TIMES DAILY WITH A MEAL.   cholecalciferol (VITAMIN D3) 25 MCG (1000 UNIT) tablet Take 1,000 Units by mouth daily.   escitalopram (LEXAPRO) 10 MG tablet Take 10 mg by mouth at bedtime.   hydrochlorothiazide (HYDRODIURIL) 25 MG tablet TAKE 1 TABLET (25 MG TOTAL) BY MOUTH DAILY.   HYDROcodone-acetaminophen (NORCO/VICODIN) 5-325 MG tablet Take by mouth.   lamoTRIgine (LAMICTAL) 200 MG tablet Take 200 mg by mouth daily.   lamoTRIgine (LAMICTAL) 25 MG tablet Take 25 mg by mouth daily.   pantoprazole (PROTONIX) 40 MG tablet Take 40 mg by  mouth every morning.   QUEtiapine (SEROQUEL) 200 MG tablet Take 200 mg by mouth at bedtime.   sucralfate (CARAFATE) 1 g tablet 1 tablet on an empty stomach   valACYclovir (VALTREX) 500 MG tablet Take 500 mg by mouth 2 (two) times daily.   vitamin C (ASCORBIC ACID) 500 MG tablet Take 500 mg by mouth daily.   Vitamin D, Ergocalciferol, (DRISDOL) 1.25 MG (50000 UNIT) CAPS capsule Take 1 capsule (50,000 Units total) by mouth every 7 (seven) days.   No facility-administered encounter medications on file as of 11/05/2022.    Allergies as of 11/05/2022 - Review Complete 11/05/2022  Allergen Reaction Noted   Klonopin [clonazepam] Anaphylaxis 10/05/2019   Lisinopril Anaphylaxis 11/28/2015   Abilify [aripiprazole] Other (See Comments) 04/25/2022   Cariprazine Swelling 09/18/2022   Metformin and related Diarrhea 12/15/2018    Past Medical History:  Diagnosis Date   Anemia 2001   after gastric bypass   Anxiety    Aortic atherosclerosis (HCC)    Arthritis    Back pain    Bilateral swelling of feet    Bipolar 1 disorder (HCC)    Breast cancer (HCC) 2013   Breast cancer    Radiation therapy   Calcified granuloma of lung (HCC)    Cigarette nicotine dependence    Cigarette nicotine dependence in remission    Cocaine dependence in remission (HCC)  COPD (chronic obstructive pulmonary disease) (HCC)    Depression    Disequilibrium    Fibromyalgia    GERD (gastroesophageal reflux disease)    HSV infection    Hypertension    Hypertensive kidney disease with stage 3a chronic kidney disease (HCC)    Hypertensive kidney disease with stage 3b chronic kidney disease (HCC)    Osteoarthritis    Personal history of infectious disease    Personal history of radiation therapy 2013   Pneumonia    Pre-diabetes    PTSD (post-traumatic stress disorder)    Seasonal allergic rhinitis due to pollen    Sleep apnea    uses CPAP   SOB (shortness of breath)    Vitamin D deficiency     Past Surgical  History:  Procedure Laterality Date   ABDOMINAL HYSTERECTOMY  2018   BREAST LUMPECTOMY Right 2013   GASTRIC BYPASS  2001   TOTAL KNEE ARTHROPLASTY Left 12/22/2018   Procedure: LEFT TOTAL KNEE ARTHROPLASTY;  Surgeon: Tarry Kos, MD;  Location: [facility] OR;  Service: Orthopedics;  Laterality: Left;    Family History  Problem Relation Age of Onset   Leukemia Mother    Prostate cancer Father        stage IV   Liver disease Brother    Diabetes Other    Cancer Other    Migraines Neg Hx    Headache Neg Hx     Social History   Socioeconomic History   Marital status: Widowed    Spouse name: Not on file   Number of children: 3   Years of education: 2 yrs college   Highest education level: Not on file  Occupational History   Occupation: Retired  Tobacco Use   Smoking status: Every Day    Packs/day: 0.50    Years: 20.00    Additional pack years: 0.00    Total pack years: 10.00    Types: Cigarettes    Start date: 06/26/1999   Smokeless tobacco: Never   Tobacco comments:    1/2 ppd 03/13/22.   Vaping Use   Vaping Use: Never used  Substance and Sexual Activity   Alcohol use: Not Currently    Comment: seldom   Drug use: No    Types: Cocaine    Comment: clean x 60 days on 11/28/15   Sexual activity: Yes  Other Topics Concern   Not on file  Social History Narrative   Widowed   3 children   Lives at home alone   disabled Comptroller   Retired      Caffeine:1-2 cups/day   Right handed   Social Determinants of Health   Financial Resource Strain: Not on file  Food Insecurity: Not on file  Transportation Needs: Not on file  Physical Activity: Inactive (12/29/2018)   Exercise Vital Sign    Days of Exercise per Week: 0 days    Minutes of Exercise per Session: 0 min  Stress: No Stress Concern Present (12/29/2018)   [employer] of Occupational Health - Occupational Stress Questionnaire    Feeling of Stress : Not at all  Social Connections: Unknown (12/29/2018)    Social Connection and Isolation Panel [NHANES]    Frequency of Communication with Friends and Family: Not on file    Frequency of Social Gatherings with Friends and Family: Not on file    Attends Religious Services: Not on file    Active Member of Clubs or Organizations: Not on file    Attends  Banker Meetings: Not on file    Marital Status: Widowed  Intimate Partner Violence: Not on file    Review of Systems  Constitutional: Negative.   HENT: Negative.    Eyes: Negative.   Respiratory:  Positive for apnea. Negative for cough, shortness of breath and wheezing.   Cardiovascular: Negative.   Gastrointestinal: Negative.   Psychiatric/Behavioral:  Positive for sleep disturbance.     Vitals:   11/05/22 0854  BP: 112/68  Pulse: 83  SpO2: 98%    Physical Exam Constitutional:      Appearance: She is well-developed. She is obese.  HENT:     Head: Normocephalic and atraumatic.     Mouth/Throat:     Mouth: Mucous membranes are moist.  Eyes:     General:        Right eye: No discharge.        Left eye: No discharge.     Pupils: Pupils are equal, round, and reactive to light.  Neck:     Thyroid: No thyromegaly.     Trachea: No tracheal deviation.  Cardiovascular:     Rate and Rhythm: Normal rate and regular rhythm.     Heart sounds: No murmur heard.    No friction rub.  Pulmonary:     Effort: Pulmonary effort is normal. No respiratory distress.     Breath sounds: Normal breath sounds. No wheezing.  Musculoskeletal:     Cervical back: Normal range of motion and neck supple.  Neurological:     Mental Status: She is alert.  Psychiatric:        Mood and Affect: Mood normal.    Data Reviewed:  Recent polysomnogram revealed mild obstructive sleep apnea Patient was started on auto CPAP  PFT from 2021 reviewed showing no obstruction, no significant bronchodilator response CT scan of the chest without contrast 06/11/2022-1.3 x 1.1 circumscribed oval nodule in the  right lower lobe  Assessment:   Obstructive sleep apnea -Unable to tolerate CPAP -She did receive an oral device -Continues to follow-up with Dr. Myrtis Ser, dentistry  Does have a history of obstructive lung disease -Most recent CT did not reveal emphysematous changes -PFT in 2021 did not reveal significant obstructive disease -Testing so far not confirmatory of the label of obstructive lung disease  Autoimmune hepatitis  Fibromyalgia  History of depression  Right lower lobe lung nodule  Plan/Recommendations:  Continue use of oral device for sleep apnea  Continue weight loss efforts  Continue graded activities as tolerated  Continue CT follow-up of lung nodule  Follow-up in about a year from now  Encouraged to call with significant concerns   Virl Diamond MD [facility] 11/05/2022, 9:13 AM  CC: Collene Mares, [place]

## 2022-11-05 NOTE — Patient Instructions (Signed)
I will see you about a year from now  Make sure you follow-up with your CAT scan, there is a spot on the lung that is being followed-it was stable the last time the CAT scan was done  Continue using your oral device and titrate for effect  Your last breathing study as we reviewed was within normal limits  Continue graded activities for weight loss  Call us with significant concerns

## 2022-11-07 ENCOUNTER — Other Ambulatory Visit (INDEPENDENT_AMBULATORY_CARE_PROVIDER_SITE_OTHER): Payer: Medicare HMO

## 2022-11-07 ENCOUNTER — Encounter: Payer: Self-pay | Admitting: Orthopaedic Surgery

## 2022-11-07 ENCOUNTER — Ambulatory Visit (INDEPENDENT_AMBULATORY_CARE_PROVIDER_SITE_OTHER): Payer: Medicare HMO | Admitting: Orthopaedic Surgery

## 2022-11-07 DIAGNOSIS — M1711 Unilateral primary osteoarthritis, right knee: Secondary | ICD-10-CM | POA: Diagnosis not present

## 2022-11-07 DIAGNOSIS — Z6841 Body Mass Index (BMI) 40.0 and over, adult: Secondary | ICD-10-CM | POA: Insufficient documentation

## 2022-11-07 DIAGNOSIS — Z96652 Presence of left artificial knee joint: Secondary | ICD-10-CM

## 2022-11-07 NOTE — Progress Notes (Signed)
Office Visit Note   Patient: Rachel Woodard           Date of Birth: 11-18-1950           MRN: [id number] Visit Date: 11/07/2022              Requested by: Collene Mares, PA [address] [place],  [place] [address] PCP: Hyacinth Meeker, [place], [place]   Assessment & Plan: Visit Diagnoses:  1. Primary osteoarthritis of right knee   2. H/O total knee replacement, left   3. Body mass index 40.0-44.9, adult (HCC)     Plan: In regards to the left knee replacement she is now 4 years postop and she is doing very well.  She can follow-up as needed.  In regards to the right knee she does have advanced DJD with varus deformity.  Currently she is not interested in any interventions.  She declined cortisone injection today.  She is not quite ready for knee replacement she still recovering from the shoulder surgery.  She can follow-up with [place] as needed.  Follow-Up Instructions: No follow-ups on file.   Orders:  Orders Placed This Encounter  Procedures   XR KNEE 3 VIEW LEFT   XR KNEE 3 VIEW RIGHT   No orders of the defined types were placed in this encounter.     Procedures: No procedures performed   Clinical Data: No additional findings.   Subjective: Chief Complaint  Patient presents with   Right Knee - Pain   Left Knee - Pain    HPI Rachel Woodard comes in today for recheck of both of her knees.  She is 4 years status post left total knee replacement.  Her only complaint is that it gets stiff at times.  She reports no pain and overall satisfied with the outcome.  Her right knee continues to bother her with symptoms of pain and swelling and this affects her daily activities.  She is currently recovering from shoulder surgery that she had a few months ago. Review of Systems  Constitutional: Negative.   HENT: Negative.    Eyes: Negative.   Respiratory: Negative.    Cardiovascular: Negative.   Endocrine: Negative.   Musculoskeletal: Negative.   Neurological:  Negative.   Hematological: Negative.   Psychiatric/Behavioral: Negative.    All other systems reviewed and are negative.    Objective: Vital Signs: There were no vitals taken for this visit.  Physical Exam Vitals and nursing note reviewed.  Constitutional:      Appearance: She is well-developed.  HENT:     Head: Normocephalic and atraumatic.  Pulmonary:     Effort: Pulmonary effort is normal.  Abdominal:     Palpations: Abdomen is soft.  Musculoskeletal:     Cervical back: Neck supple.  Skin:    General: Skin is warm.     Capillary Refill: Capillary refill takes less than 2 seconds.  Neurological:     Mental Status: She is alert and oriented to person, place, and time.  Psychiatric:        Behavior: Behavior normal.        Thought Content: Thought content normal.        Judgment: Judgment normal.     Ortho Exam Examination of the left knee shows fully healed surgical scar.  Functional range of motion.  Collaterals are stable.  No signs infection.  Examination of the right knee shows medial joint line tenderness.  Pain and crepitus with range  of motion.  Collaterals and cruciates are stable. Specialty Comments:  No specialty comments available.  Imaging: XR KNEE 3 VIEW LEFT  Result Date: 11/07/2022 Stable left total knee replacement without complication  XR KNEE 3 VIEW RIGHT  Result Date: 11/07/2022 X-rays demonstrate severe osteoarthritis.  Bone-on-bone joint space narrowing.  Varus deformity.    PMFS History: Patient Active Problem List   Diagnosis Date Noted   Primary osteoarthritis of right knee 11/07/2022   Body mass index 40.0-44.9, adult (HCC) 11/07/2022   Chronic kidney disease, stage 3a (HCC) 09/18/2022   Pain due to onychomycosis of toenails of both feet 01/20/2020   Shortness of breath 10/05/2019   H/O total knee replacement, left 12/22/2018   MDD (major depressive disorder), recurrent episode, mild (HCC) 10/29/2018   Autoimmune hepatitis (HCC)  10/29/2018   OSA (obstructive sleep apnea) 10/29/2018   Renal cyst, right 10/22/2018   Lung nodule, solitary 10/22/2018   Genital herpes simplex 10/29/2017   Vitamin D deficiency 10/29/2017   Morbid obesity (HCC) 10/29/2017   Essential hypertension 10/29/2017   Prediabetes 02/14/2017   Fibromyalgia 02/14/2017   Substance abuse in remission (HCC) 02/14/2017   Anxiety and depression 02/14/2017   HX: breast cancer 02/14/2017   Urge incontinence 11/13/2016   Unilateral primary osteoarthritis, left knee 06/14/2016   Hx of breast cancer 04/02/2016   GERD (gastroesophageal reflux disease) 11/02/2015   Angioedema 10/24/2013   Tobacco abuse 10/24/2013   DCIS (ductal carcinoma in situ) of breast 04/07/2013   Depression 04/07/2013   Past Medical History:  Diagnosis Date   Anemia 2001   after gastric bypass   Anxiety    Aortic atherosclerosis (HCC)    Arthritis    Back pain    Bilateral swelling of feet    Bipolar 1 disorder (HCC)    Breast cancer (HCC) 2013   Breast cancer    Radiation therapy   Calcified granuloma of lung (HCC)    Cigarette nicotine dependence    Cigarette nicotine dependence in remission    Cocaine dependence in remission (HCC)    COPD (chronic obstructive pulmonary disease) (HCC)    Depression    Disequilibrium    Fibromyalgia    GERD (gastroesophageal reflux disease)    HSV infection    Hypertension    Hypertensive kidney disease with stage 3a chronic kidney disease (HCC)    Hypertensive kidney disease with stage 3b chronic kidney disease (HCC)    Osteoarthritis    Personal history of infectious disease    Personal history of radiation therapy 2013   Pneumonia    Pre-diabetes    PTSD (post-traumatic stress disorder)    Seasonal allergic rhinitis due to pollen    Sleep apnea    uses CPAP   SOB (shortness of breath)    Vitamin D deficiency     Family History  Problem Relation Age of Onset   Leukemia Mother    Prostate cancer Father        stage  IV   Liver disease Brother    Diabetes Other    Cancer Other    Migraines Neg Hx    Headache Neg Hx     Past Surgical History:  Procedure Laterality Date   ABDOMINAL HYSTERECTOMY  2018   BREAST LUMPECTOMY Right 2013   GASTRIC BYPASS  2001   TOTAL KNEE ARTHROPLASTY Left 12/22/2018   Procedure: LEFT TOTAL KNEE ARTHROPLASTY;  Surgeon: Tarry Kos, MD;  Location: [facility] OR;  Service: Orthopedics;  Laterality: Left;  Social History   Occupational History   Occupation: Retired  Tobacco Use   Smoking status: Every Day    Packs/day: 0.50    Years: 20.00    Additional pack years: 0.00    Total pack years: 10.00    Types: Cigarettes    Start date: 06/26/1999   Smokeless tobacco: Never   Tobacco comments:    1/2 ppd 03/13/22.   Vaping Use   Vaping Use: Never used  Substance and Sexual Activity   Alcohol use: Not Currently    Comment: seldom   Drug use: No    Types: Cocaine    Comment: clean x 60 days on 11/28/15   Sexual activity: Yes

## 2022-11-12 ENCOUNTER — Other Ambulatory Visit: Payer: Self-pay | Admitting: Internal Medicine

## 2022-11-12 DIAGNOSIS — R911 Solitary pulmonary nodule: Secondary | ICD-10-CM

## 2022-11-14 ENCOUNTER — Encounter (INDEPENDENT_AMBULATORY_CARE_PROVIDER_SITE_OTHER): Payer: Self-pay | Admitting: Family Medicine

## 2022-11-14 ENCOUNTER — Telehealth (INDEPENDENT_AMBULATORY_CARE_PROVIDER_SITE_OTHER): Payer: Medicare HMO | Admitting: Family Medicine

## 2022-11-14 DIAGNOSIS — Z6841 Body Mass Index (BMI) 40.0 and over, adult: Secondary | ICD-10-CM | POA: Diagnosis not present

## 2022-11-14 DIAGNOSIS — D508 Other iron deficiency anemias: Secondary | ICD-10-CM

## 2022-11-14 DIAGNOSIS — Z9884 Bariatric surgery status: Secondary | ICD-10-CM | POA: Insufficient documentation

## 2022-11-14 DIAGNOSIS — E559 Vitamin D deficiency, unspecified: Secondary | ICD-10-CM | POA: Diagnosis not present

## 2022-11-14 DIAGNOSIS — R600 Localized edema: Secondary | ICD-10-CM | POA: Diagnosis not present

## 2022-11-14 MED ORDER — SLOW IRON 160 (50 FE) MG PO TBCR: 160.0000 mg | EXTENDED_RELEASE_TABLET | Freq: Every day | ORAL | 0 refills | Status: DC

## 2022-11-14 MED ORDER — VITAMIN D (ERGOCALCIFEROL) 1.25 MG (50000 UNIT) PO CAPS: 50000.0000 [IU] | ORAL_CAPSULE | ORAL | 0 refills | Status: DC

## 2022-11-14 NOTE — Progress Notes (Signed)
TeleHealth Visit:  This visit was completed with telemedicine (audio/video) technology. Rachel Woodard has verbally consented to this TeleHealth visit. The patient is located at home, the provider is located at home. The participants in this visit include the listed provider and patient. The visit was conducted today via MyChart video.  OBESITY Rachel Woodard is here to discuss her progress with her obesity treatment plan along with follow-up of her obesity related diagnoses.   Today's visit was # 24 Starting weight: 258 lbs Starting date: 03/14/21 Weight at last in office visit: 254 lbs on 10/22/22 Total weight loss: 4 lbs at last in office visit on 10/22/22. Today's reported weight (11/14/22): none reported  Nutrition Plan: the Category 3 plan - 0% adherence.  Current exercise:  walking 30 minutes 4 days per week     Interim History:  Breakfast-Coffee, protein shake, banana, sometimes waffle with PB but often skips. Often only eats one meal per day (last night-2 [place] burgers, slice cheese, [employer] flip). Needs right TKR. Pain has improved since edema has reduced.  Needs to lose more weight to qualify for knee replacement. S/P rotator cuff repair January and just finished PT. Mostly drinks water or Mtn Dew Zero.   She thinks that Medicaid may start covering [facility] and is hopeful that this will help her.  Eating all of the prescribed protein: no Skipping meals: Yes Drinking adequate water: Yes Drinking sugar sweetened beverages: Occasionally has sweet tea Hunger controlled: well controlled.  Assessment/Plan:  1. Status Post Roux-en-Y Gastric Bypass Patient is status post Roux-en-Y with Dr. Venetia Woodard at [facility] [place] in 2001.  Her highest weight prior to surgery was 286 and nadir weight after surgery was 186.  She started regaining within a few years due to no exercise and sugar consumption per patient.  She is taking Vit B12 , Vit D, and Calcium .  She takes a  multivitamin but is unsure if it has any iron in it.  She reports no restriction.   Checked into revision surgery with [facility] and was told she was not a candidate because of her age.  Plan: Resume following category 3 as closely as possible. Continue all vitamins. Advise she could seek a second opinion from Dr. Olin Pia [facility]) in [place].  2. Lower extremity edema PCP doubled dose of HCTZ which has almost eliminated the edema.  She is able to walk with less pain.  Plan: Continue HCTZ per PCP  3. Iron deficiency anemia Ferritin low normal.  No iron supplementation.  Unsure if multivitamin has iron.  Has history of having iron infusions within a few years of gastric bypass. She says she had lab work recently done at her PCP. CBC from August 2023 in care everywhere shows hemoglobin of 13.1.  Lab Results  Component Value Date   WBC 7.6 03/14/2021   HGB 13.3 03/14/2021   HCT 40.5 03/14/2021   MCV 88 03/14/2021   PLT 255 03/14/2021   Lab Results  Component Value Date   FERRITIN 17 06/13/2022     Plan: Obtain lab work from PCP. Bring multivitamin bottle into next office visit with Rachel Woodard.  4. Vitamin D Deficiency Vitamin D is not at goal of 50.  Most recent vitamin D level was 36.9 on 06/13/2022. She is on  prescription ergocalciferol 50,000 IU weekly. Lab Results  Component Value Date   VD25OH 36.9 06/13/2022   VD25OH 33.0 03/14/2021   VD25OH 27.7 (L) 04/24/2018    Plan: Continue and refill  prescription ergocalciferol 50,000 IU weekly  5. Morbid Obesity: Current BMI 41  Rachel Woodard is currently in the action stage of change. As such, her goal is to continue with weight loss efforts.  She has agreed to the Category 3 plan.  1.  Try to follow category 3 as written.  May have 2 meals per day and one protein shake.  Exercise goals:  as is  Behavioral modification strategies: increasing lean protein intake, decreasing simple  carbohydrates , no meal skipping, meal planning , and planning for success.  [name] has agreed to follow-up with our clinic in 4 weeks.  No orders of the defined types were placed in this encounter.   Medications Discontinued During This Encounter  Medication Reason   Vitamin D, Ergocalciferol, (DRISDOL) 1.25 MG (50000 UNIT) CAPS capsule Reorder   ferrous sulfate (SLOW IRON) 160 (50 Fe) MG TBCR SR tablet      Meds ordered this encounter  Medications   DISCONTD: ferrous sulfate (SLOW IRON) 160 (50 Fe) MG TBCR SR tablet    Sig: Take 1 tablet (160 mg total) by mouth daily.    Dispense:  30 tablet    Refill:  0    Order Specific Question:   Supervising Provider    Answer:   Rachel Woodard [[id number]]   Vitamin D, Ergocalciferol, (DRISDOL) 1.25 MG (50000 UNIT) CAPS capsule    Sig: Take 1 capsule (50,000 Units total) by mouth every 7 (seven) days.    Dispense:  4 capsule    Refill:  0    Order Specific Question:   Supervising Provider    Answer:   Glennis Brink [[id number]]      Objective:   VITALS: Per patient if applicable, see vitals. GENERAL: Alert and in no acute distress. CARDIOPULMONARY: No increased WOB. Speaking in clear sentences.  PSYCH: Pleasant and cooperative. Speech normal rate and rhythm. Affect is appropriate. Insight and judgement are appropriate. Attention is focused, linear, and appropriate.  NEURO: Oriented as arrived to appointment on time with no prompting.   Attestation Statements:   Reviewed by clinician on day of visit: allergies, medications, problem list, medical history, surgical history, family history, social history, and previous encounter notes.   This was prepared with the assistance of Engineer, civil (consulting).  Occasional wrong-word or sound-a-like substitutions may have occurred due to the inherent limitations of voice recognition software.  She started regaining within a few years due to no exercise and sugar consumption per patient.

## 2022-11-15 DIAGNOSIS — F411 Generalized anxiety disorder: Secondary | ICD-10-CM | POA: Diagnosis not present

## 2022-11-15 DIAGNOSIS — F3130 Bipolar disorder, current episode depressed, mild or moderate severity, unspecified: Secondary | ICD-10-CM | POA: Diagnosis not present

## 2022-11-15 DIAGNOSIS — F17210 Nicotine dependence, cigarettes, uncomplicated: Secondary | ICD-10-CM | POA: Diagnosis not present

## 2022-11-15 DIAGNOSIS — F4312 Post-traumatic stress disorder, chronic: Secondary | ICD-10-CM | POA: Diagnosis not present

## 2022-11-27 ENCOUNTER — Encounter (INDEPENDENT_AMBULATORY_CARE_PROVIDER_SITE_OTHER): Payer: Self-pay | Admitting: Family Medicine

## 2022-11-28 DIAGNOSIS — F339 Major depressive disorder, recurrent, unspecified: Secondary | ICD-10-CM | POA: Diagnosis not present

## 2022-11-28 DIAGNOSIS — Z4789 Encounter for other orthopedic aftercare: Secondary | ICD-10-CM | POA: Diagnosis not present

## 2022-12-10 ENCOUNTER — Encounter: Payer: Self-pay | Admitting: Nurse Practitioner

## 2022-12-10 ENCOUNTER — Ambulatory Visit (INDEPENDENT_AMBULATORY_CARE_PROVIDER_SITE_OTHER): Payer: Medicare HMO | Admitting: Nurse Practitioner

## 2022-12-10 VITALS — BP 129/75 | HR 72 | Temp 98.2°F | Ht 66.0 in | Wt 252.0 lb

## 2022-12-10 DIAGNOSIS — E559 Vitamin D deficiency, unspecified: Secondary | ICD-10-CM

## 2022-12-10 DIAGNOSIS — Z6841 Body Mass Index (BMI) 40.0 and over, adult: Secondary | ICD-10-CM | POA: Diagnosis not present

## 2022-12-10 DIAGNOSIS — R600 Localized edema: Secondary | ICD-10-CM

## 2022-12-10 MED ORDER — VITAMIN D (ERGOCALCIFEROL) 1.25 MG (50000 UNIT) PO CAPS: 50000.0000 [IU] | ORAL_CAPSULE | ORAL | 0 refills | Status: DC

## 2022-12-10 NOTE — Progress Notes (Signed)
Office: [phone]  /  Fax: [phone]  WEIGHT SUMMARY AND BIOMETRICS  Weight Lost Since Last Visit: 2lb  No data recorded  Vitals Temp: 98.2 F (36.8 C) BP: 129/75 Pulse Rate: 72 SpO2: 97 %   Anthropometric Measurements Height: 5\' 6"  (1.676 m) Weight: 252 lb (114.3 kg) BMI (Calculated): 40.69 Weight at Last Visit: 254lb Weight Lost Since Last Visit: 2lb Starting Weight: 258lb Total Weight Loss (lbs): 6 lb (2.722 kg)   Body Composition  Body Fat %: 49.2 % Fat Mass (lbs): 124 lbs Muscle Mass (lbs): 121.8 lbs Total Body Water (lbs): 96.2 lbs Visceral Fat Rating : 17   Other Clinical Data Fasting: No Labs: No Today's Visit #: 24 Starting Date: 03/14/21     HPI  Chief Complaint: OBESITY  Anapatricia is here to discuss her progress with her obesity treatment plan. She is on the the Category 3 Plan and states she is following her eating plan approximately 70 % of the time. She states she is exercising 45 minutes 4 days per week-walking.  Interval History:  Since last office visit she has lost 2 pounds.  She is drinking water and green tea.  She wants to lose weight to have left knee replacement.  She just finished PT for her left shoulder.     24 hour food recall: BF:  coffee with cream of sugar, protein shake and a banana Snack:  none Lunch:  ham sandwich with cheese and diet soda or a protein shake Snack:  yogurt with pretzels or raisin bread with peanut butter Dinner:  protein (ex [place] burgers) with vegetables or baked potato.    Pharmacotherapy for weight loss: She is not currently taking medications  for medical weight loss.    Previous pharmacotherapy for medical weight loss:  Has tried OTC weight loss meds.    Bariatric surgery:   She is status post gastric bypass by in [place] in 2001.  Her highest weight prior to surgery was 286 lbs and her nadir weight after surgery was 186 lbs.  Taking Vit D, Calcium and Vit B12.  She is not taking a  multivitamin or iron.    Lower ext edema She is taking hydrochlorothiazide 25mg  and is doing well.  Denies side effects.  Reports her edema has almost completely resolved.    Vit D deficiency  She is taking Vit D 50,000 IU weekly.  Denies side effects.  Denies nausea, vomiting or muscle weakness.    Lab Results  Component Value Date   VD25OH 36.9 06/13/2022   VD25OH 33.0 03/14/2021   VD25OH 27.7 (L) 04/24/2018      PHYSICAL EXAM:  Blood pressure 129/75, pulse 72, temperature 98.2 F (36.8 C), height 5\' 6"  (1.676 m), weight 252 lb (114.3 kg), SpO2 97 %. Body mass index is 40.67 kg/m.  General: She is overweight, cooperative, alert, well developed, and in no acute distress. PSYCH: Has normal mood, affect and thought process.   Extremities: No edema.  Neurologic: No gross sensory or motor deficits. No tremors or fasciculations noted.    DIAGNOSTIC DATA REVIEWED:  BMET    Component Value Date/Time   NA 140 08/27/2022 0000   K 4.4 08/27/2022 0000   CL 103 08/27/2022 0000   CO2 31 (A) 08/27/2022 0000   GLUCOSE 100 (H) 06/13/2022 1225   GLUCOSE 115 (H) 12/23/2018 0521   BUN 30 (A) 08/27/2022 0000   CREATININE 1.1 08/27/2022 0000   CREATININE 1.18 (H) 06/13/2022 1225   CREATININE 0.95  08/08/2016 1102   CALCIUM 9.7 08/27/2022 0000   GFRNONAA 41 (L) 12/23/2018 0521   GFRAA 47 (L) 12/23/2018 0521   Lab Results  Component Value Date   HGBA1C 6.0 (H) 06/13/2022   HGBA1C 5.7 08/08/2016   Lab Results  Component Value Date   INSULIN 14.4 03/14/2021   Lab Results  Component Value Date   TSH 3.320 03/14/2021   CBC    Component Value Date/Time   WBC 7.2 08/27/2022 0000   WBC 8.7 12/23/2018 0521   RBC 4.64 08/27/2022 0000   HGB 13.2 08/27/2022 0000   HGB 13.3 03/14/2021 0844   HCT 40 08/27/2022 0000   HCT 40.5 03/14/2021 0844   PLT 262 08/27/2022 0000   PLT 255 03/14/2021 0844   MCV 88 03/14/2021 0844   MCH 28.7 03/14/2021 0844   MCH 27.9 12/23/2018 0521   MCHC  32.8 03/14/2021 0844   MCHC 31.9 12/23/2018 0521   RDW 14.5 03/14/2021 0844   Iron Studies    Component Value Date/Time   FERRITIN 17 06/13/2022 1225   Lipid Panel     Component Value Date/Time   CHOL 162 08/27/2022 0000   CHOL 170 03/14/2021 0844   TRIG 115 08/27/2022 0000   HDL 55 08/27/2022 0000   HDL 53 03/14/2021 0844   CHOLHDL 3.1 07/11/2017 1107   CHOLHDL 3.0 08/08/2016 1102   VLDL 17 08/08/2016 1102   LDLCALC 87 08/27/2022 0000   LDLCALC 99 03/14/2021 0844   Hepatic Function Panel     Component Value Date/Time   PROT 6.9 06/13/2022 1225   ALBUMIN 4.1 06/13/2022 1225   AST 14 08/27/2022 0000   ALT 17 08/27/2022 0000   ALKPHOS 114 06/13/2022 1225   BILITOT 0.2 06/13/2022 1225   BILIDIR 0.1 08/08/2016 1102   IBILI 0.3 08/08/2016 1102      Component Value Date/Time   TSH 3.320 03/14/2021 0844   Nutritional Lab Results  Component Value Date   VD25OH 36.9 06/13/2022   VD25OH 33.0 03/14/2021   VD25OH 27.7 (L) 04/24/2018     ASSESSMENT AND PLAN  TREATMENT PLAN FOR OBESITY:  Recommended Dietary Goals  Deani is currently in the action stage of change. As such, her goal is to continue weight management plan. She has agreed to the Category 3 Plan.  Behavioral Intervention  We discussed the following Behavioral Modification Strategies today: increasing lean protein intake, decreasing simple carbohydrates , increasing vegetables, increasing lower glycemic fruits, increasing fiber rich foods, avoiding skipping meals, increasing water intake, work on meal planning and preparation, continue to practice mindfulness when eating, and planning for success.  Additional resources provided today: NA  Recommended Physical Activity Goals  Marcelline has been advised to work up to 150 minutes of moderate intensity aerobic activity a week and strengthening exercises 2-3 times per week for cardiovascular health, weight loss maintenance and preservation of muscle mass.    She has agreed to Continue current level of physical activity  and Think about ways to increase daily physical activity and overcoming barriers to exercise    ASSOCIATED CONDITIONS ADDRESSED TODAY  Action/Plan  Lower extremity edema Continue follow-up with PCP.  Continue medications as directed.  Vitamin D deficiency -     Vitamin D (Ergocalciferol); Take 1 capsule (50,000 Units total) by mouth every 7 (seven) days.  Dispense: 4 capsule; Refill: 0  Morbid obesity (HCC)  BMI 40.0-44.9, adult [facility])       She is scheduled for seminar with [facility] on  June 21st and then plans to go from there.    Will obtain labs at next visit.    Goals:  Start resistance training with weights and bands she has at home Increase water intake Continue protein with each meal.   Return in about 4 weeks (around 01/07/2023).Marland Kitchen She was informed of the importance of frequent follow up visits to maximize her success with intensive lifestyle modifications for her multiple health conditions.   ATTESTASTION STATEMENTS:  Reviewed by clinician on day of visit: allergies, medications, problem list, medical history, surgical history, family history, social history, and previous encounter notes.     Theodis Sato. Jillaine Waren FNP-C

## 2022-12-13 ENCOUNTER — Ambulatory Visit
Admission: RE | Admit: 2022-12-13 | Discharge: 2022-12-13 | Disposition: A | Discharge status: Home / Self Care | Payer: Medicare HMO | Source: Ambulatory Visit | Attending: Internal Medicine | Admitting: Internal Medicine

## 2022-12-13 DIAGNOSIS — R911 Solitary pulmonary nodule: Secondary | ICD-10-CM

## 2022-12-13 DIAGNOSIS — I700 Atherosclerosis of aorta: Secondary | ICD-10-CM | POA: Diagnosis not present

## 2022-12-13 DIAGNOSIS — K449 Diaphragmatic hernia without obstruction or gangrene: Secondary | ICD-10-CM | POA: Diagnosis not present

## 2022-12-14 DIAGNOSIS — N3942 Incontinence without sensory awareness: Secondary | ICD-10-CM | POA: Diagnosis not present

## 2022-12-18 ENCOUNTER — Other Ambulatory Visit: Payer: Self-pay | Admitting: Nurse Practitioner

## 2022-12-18 DIAGNOSIS — E559 Vitamin D deficiency, unspecified: Secondary | ICD-10-CM

## 2022-12-26 DIAGNOSIS — R6889 Other general symptoms and signs: Secondary | ICD-10-CM | POA: Diagnosis not present

## 2022-12-26 DIAGNOSIS — F339 Major depressive disorder, recurrent, unspecified: Secondary | ICD-10-CM | POA: Diagnosis not present

## 2023-01-01 DIAGNOSIS — R6889 Other general symptoms and signs: Secondary | ICD-10-CM | POA: Diagnosis not present

## 2023-01-09 ENCOUNTER — Encounter: Payer: Self-pay | Admitting: Nurse Practitioner

## 2023-01-09 ENCOUNTER — Ambulatory Visit: Payer: Medicare HMO | Admitting: Nurse Practitioner

## 2023-01-09 VITALS — BP 126/80 | HR 74 | Temp 98.1°F | Ht 66.0 in | Wt 254.0 lb

## 2023-01-09 DIAGNOSIS — R6889 Other general symptoms and signs: Secondary | ICD-10-CM | POA: Diagnosis not present

## 2023-01-09 DIAGNOSIS — Z6841 Body Mass Index (BMI) 40.0 and over, adult: Secondary | ICD-10-CM | POA: Diagnosis not present

## 2023-01-09 DIAGNOSIS — R7303 Prediabetes: Secondary | ICD-10-CM

## 2023-01-09 DIAGNOSIS — E559 Vitamin D deficiency, unspecified: Secondary | ICD-10-CM

## 2023-01-09 DIAGNOSIS — Z79899 Other long term (current) drug therapy: Secondary | ICD-10-CM

## 2023-01-09 DIAGNOSIS — D508 Other iron deficiency anemias: Secondary | ICD-10-CM | POA: Diagnosis not present

## 2023-01-09 NOTE — Progress Notes (Signed)
Office: [phone]  /  Fax: [phone]  WEIGHT SUMMARY AND BIOMETRICS  Weight Lost Since Last Visit: 0lb  Weight Gained Since Last Visit: 2lb   Vitals Temp: 98.1 F (36.7 C) BP: 126/80 Pulse Rate: 74 SpO2: 100 %   Anthropometric Measurements Height: 5\' 6"  (1.676 m) Weight: 254 lb (115.2 kg) BMI (Calculated): 41.02 Weight at Last Visit: 252lb Weight Lost Since Last Visit: 0lb Weight Gained Since Last Visit: 2lb Starting Weight: 258lb Total Weight Loss (lbs): 4 lb (1.814 kg)   Body Composition  Body Fat %: 49.1 % Fat Mass (lbs): 124.8 lbs Muscle Mass (lbs): 122.6 lbs Total Body Water (lbs): 94.4 lbs Visceral Fat Rating : 17   Other Clinical Data Fasting: No Labs: No Today's Visit #: 25 Starting Date: 03/14/21     HPI  Chief Complaint: OBESITY  Rachel Woodard is here to discuss her progress with her obesity treatment plan. She is on the the Category 3 Plan and states she is following her eating plan approximately 80 % of the time. She states she is exercising 30 minutes 3 days per week.   Interval History:  Since last office visit she has gained 2 pounds.     Pharmacotherapy for weight loss: She is not currently taking medications  for medical weight loss.      Previous pharmacotherapy for medical weight loss:  OTC meds  Bariatric surgery:  She is status post gastric bypass by in [place] in 2001.  Her highest weight prior to surgery was 286 lbs and her nadir weight after surgery was 186 lbs.  Taking Vit D, Calcium and Vit B12.  She is not taking a multivitamin or iron.   She did a Radiation protection practitioner with Dr. Adolphus Birchwood and plans to meet with him to discuss options.    Iron def anemia  She is not currently taking a multivitamin and iron.  She had iron infusions in the past.    Vit D deficiency  She is taking Vit D 50,000 IU weekly.  Denies side effects.  Denies nausea, vomiting or muscle weakness.    Lab Results  Component Value Date   VD25OH 36.9  06/13/2022   VD25OH 33.0 03/14/2021   VD25OH 27.7 (L) 04/24/2018    Prediabetes Last A1c was 6.0  Medication(s):  took Metformin in the past and stopped due to side effects of diarrhea.   Polyphagia:Yes Lab Results  Component Value Date   HGBA1C 6.0 (H) 06/13/2022   HGBA1C 6.0 (H) 03/14/2021   HGBA1C 5.7 (H) 11/11/2018   HGBA1C 5.8 (H) 04/24/2018   HGBA1C 5.7 (H) 01/22/2018   Lab Results  Component Value Date   INSULIN 14.4 03/14/2021    PHYSICAL EXAM:  Blood pressure 126/80, pulse 74, temperature 98.1 F (36.7 C), height 5\' 6"  (1.676 m), weight 254 lb (115.2 kg), SpO2 100%. Body mass index is 41 kg/m.  General: She is overweight, cooperative, alert, well developed, and in no acute distress. PSYCH: Has normal mood, affect and thought process.   Extremities: No edema.  Neurologic: No gross sensory or motor deficits. No tremors or fasciculations noted.    DIAGNOSTIC DATA REVIEWED:  BMET    Component Value Date/Time   NA 140 08/27/2022 0000   K 4.4 08/27/2022 0000   CL 103 08/27/2022 0000   CO2 31 (A) 08/27/2022 0000   GLUCOSE 100 (H) 06/13/2022 1225   GLUCOSE 115 (H) 12/23/2018 0521   BUN 30 (A) 08/27/2022 0000   CREATININE 1.1 08/27/2022 0000  CREATININE 1.18 (H) 06/13/2022 1225   CREATININE 0.95 08/08/2016 1102   CALCIUM 9.7 08/27/2022 0000   GFRNONAA 41 (L) 12/23/2018 0521   GFRAA 47 (L) 12/23/2018 0521   Lab Results  Component Value Date   HGBA1C 6.0 (H) 06/13/2022   HGBA1C 5.7 08/08/2016   Lab Results  Component Value Date   INSULIN 14.4 03/14/2021   Lab Results  Component Value Date   TSH 3.320 03/14/2021   CBC    Component Value Date/Time   WBC 7.2 08/27/2022 0000   WBC 8.7 12/23/2018 0521   RBC 4.64 08/27/2022 0000   HGB 13.2 08/27/2022 0000   HGB 13.3 03/14/2021 0844   HCT 40 08/27/2022 0000   HCT 40.5 03/14/2021 0844   PLT 262 08/27/2022 0000   PLT 255 03/14/2021 0844   MCV 88 03/14/2021 0844   MCH 28.7 03/14/2021 0844   MCH 27.9  12/23/2018 0521   MCHC 32.8 03/14/2021 0844   MCHC 31.9 12/23/2018 0521   RDW 14.5 03/14/2021 0844   Iron Studies    Component Value Date/Time   FERRITIN 17 06/13/2022 1225   Lipid Panel     Component Value Date/Time   CHOL 162 08/27/2022 0000   CHOL 170 03/14/2021 0844   TRIG 115 08/27/2022 0000   HDL 55 08/27/2022 0000   HDL 53 03/14/2021 0844   CHOLHDL 3.1 07/11/2017 1107   CHOLHDL 3.0 08/08/2016 1102   VLDL 17 08/08/2016 1102   LDLCALC 87 08/27/2022 0000   LDLCALC 99 03/14/2021 0844   Hepatic Function Panel     Component Value Date/Time   PROT 6.9 06/13/2022 1225   ALBUMIN 4.1 06/13/2022 1225   AST 14 08/27/2022 0000   ALT 17 08/27/2022 0000   ALKPHOS 114 06/13/2022 1225   BILITOT 0.2 06/13/2022 1225   BILIDIR 0.1 08/08/2016 1102   IBILI 0.3 08/08/2016 1102      Component Value Date/Time   TSH 3.320 03/14/2021 0844   Nutritional Lab Results  Component Value Date   VD25OH 36.9 06/13/2022   VD25OH 33.0 03/14/2021   VD25OH 27.7 (L) 04/24/2018     ASSESSMENT AND PLAN  TREATMENT PLAN FOR OBESITY:  Recommended Dietary Goals  Rachel Woodard is currently in the action stage of change. As such, her goal is to continue weight management plan. She has agreed to the Category 3 Plan.  Behavioral Intervention  We discussed the following Behavioral Modification Strategies today: increasing lean protein intake, decreasing simple carbohydrates , increasing vegetables, increasing lower glycemic fruits, increasing water intake, reading food labels , keeping healthy foods at home, continue to practice mindfulness when eating, and planning for success.  Additional resources provided today: NA  Recommended Physical Activity Goals  Rachel Woodard has been advised to work up to 150 minutes of moderate intensity aerobic activity a week and strengthening exercises 2-3 times per week for cardiovascular health, weight loss maintenance and preservation of muscle mass.   She has agreed to  Continue current level of physical activity  and Think about ways to increase daily physical activity and overcoming barriers to exercise   ASSOCIATED CONDITIONS ADDRESSED TODAY  Action/Plan  Other iron deficiency anemia -     Ferritin -     CBC with Differential/Platelet -     Iron  Vitamin D deficiency -     VITAMIN D 25 Hydroxy (Vit-D Deficiency, Fractures)  Prediabetes -     Hemoglobin A1c  Medication management -     Comprehensive metabolic panel  Morbid  obesity (HCC)  BMI 40.0-44.9, adult (HCC)      Goals:  Resistance training with weights and bands she has at home Increase water intake Continue protein with each meal.    Return in about 4 weeks (around 02/06/2023).Marland Kitchen She was informed of the importance of frequent follow up visits to maximize her success with intensive lifestyle modifications for her multiple health conditions.   ATTESTASTION STATEMENTS:  Reviewed by clinician on day of visit: allergies, medications, problem list, medical history, surgical history, family history, social history, and previous encounter notes.   Theodis Sato. Sun Kihn FNP-C

## 2023-01-23 DIAGNOSIS — F17210 Nicotine dependence, cigarettes, uncomplicated: Secondary | ICD-10-CM | POA: Diagnosis not present

## 2023-01-23 DIAGNOSIS — F3130 Bipolar disorder, current episode depressed, mild or moderate severity, unspecified: Secondary | ICD-10-CM | POA: Diagnosis not present

## 2023-01-23 DIAGNOSIS — F4312 Post-traumatic stress disorder, chronic: Secondary | ICD-10-CM | POA: Diagnosis not present

## 2023-01-23 DIAGNOSIS — F411 Generalized anxiety disorder: Secondary | ICD-10-CM | POA: Diagnosis not present

## 2023-01-23 DIAGNOSIS — F339 Major depressive disorder, recurrent, unspecified: Secondary | ICD-10-CM | POA: Diagnosis not present

## 2023-01-28 ENCOUNTER — Telehealth (INDEPENDENT_AMBULATORY_CARE_PROVIDER_SITE_OTHER): Payer: Self-pay | Admitting: Nurse Practitioner

## 2023-01-28 NOTE — Telephone Encounter (Signed)
The patient urgently requires a refill of Drisdol. Please call at your earliest convenience to confirm if this refill can be processed. Thank you.

## 2023-01-28 NOTE — Telephone Encounter (Signed)
Pt is requesting refill, Drisdol 50,000 units one po qwk  LAST APPOINTMENT DATE: 01/09/2023 NEXT APPOINTMENT DATE: 02/14/2023   [facility] - [place], [place] - [address] [facility] [address] [place] [address] Phone: [phone] Fax: [phone]  Patient is requesting a refill of the following medications: Requested Prescriptions    No prescriptions requested or ordered in this encounter    Date last filled: 12/10/2022 Previously prescribed by Irene Limbo  Lab Results  Component Value Date   HGBA1C 6.0 (H) 06/13/2022   HGBA1C 6.0 (H) 03/14/2021   HGBA1C 5.7 (H) 11/11/2018   Lab Results  Component Value Date   LDLCALC 87 08/27/2022   CREATININE 1.1 08/27/2022   Lab Results  Component Value Date   VD25OH 36.9 06/13/2022   VD25OH 33.0 03/14/2021   VD25OH 27.7 (L) 04/24/2018    BP Readings from Last 3 Encounters:  01/09/23 126/80  12/10/22 129/75  11/05/22 112/68

## 2023-02-14 ENCOUNTER — Ambulatory Visit: Payer: Medicare HMO | Admitting: Nurse Practitioner

## 2023-02-18 ENCOUNTER — Ambulatory Visit: Payer: Medicare HMO | Admitting: Nurse Practitioner

## 2023-03-12 ENCOUNTER — Ambulatory Visit: Payer: Medicare HMO | Admitting: Nurse Practitioner

## 2023-03-29 DIAGNOSIS — H5213 Myopia, bilateral: Secondary | ICD-10-CM | POA: Diagnosis not present

## 2023-05-27 ENCOUNTER — Ambulatory Visit: Payer: Medicare HMO | Admitting: Nurse Practitioner

## 2023-06-06 ENCOUNTER — Ambulatory Visit (INDEPENDENT_AMBULATORY_CARE_PROVIDER_SITE_OTHER): Payer: Medicare HMO | Admitting: Nurse Practitioner

## 2023-06-06 ENCOUNTER — Telehealth: Payer: Self-pay

## 2023-06-06 ENCOUNTER — Encounter: Payer: Self-pay | Admitting: Nurse Practitioner

## 2023-06-06 VITALS — BP 136/80 | HR 76 | Temp 97.8°F | Ht 66.0 in | Wt 252.0 lb

## 2023-06-06 DIAGNOSIS — Z9884 Bariatric surgery status: Secondary | ICD-10-CM

## 2023-06-06 DIAGNOSIS — K912 Postsurgical malabsorption, not elsewhere classified: Secondary | ICD-10-CM | POA: Diagnosis not present

## 2023-06-06 DIAGNOSIS — Z6841 Body Mass Index (BMI) 40.0 and over, adult: Secondary | ICD-10-CM

## 2023-06-06 MED ORDER — WEGOVY 0.25 MG/0.5ML ~~LOC~~ SOAJ: 0.2500 mg | SUBCUTANEOUS | 0 refills | Status: DC

## 2023-06-06 NOTE — Progress Notes (Signed)
Office: [phone]  /  Fax: [phone]  WEIGHT SUMMARY AND BIOMETRICS  Weight Lost Since Last Visit: 2lb  Weight Gained Since Last Visit: 0lb   Vitals Temp: 97.8 F (36.6 C) BP: 136/80 Pulse Rate: 76 SpO2: 97 %   Anthropometric Measurements Height: 5\' 6"  (1.676 m) Weight: 252 lb (114.3 kg) BMI (Calculated): 40.69 Weight at Last Visit: 254lb Weight Lost Since Last Visit: 2lb Weight Gained Since Last Visit: 0lb Starting Weight: 258lb Total Weight Loss (lbs): 6 lb (2.722 kg)   Body Composition  Body Fat %: 51.6 % Fat Mass (lbs): 130.4 lbs Muscle Mass (lbs): 116.2 lbs Visceral Fat Rating : 18   Other Clinical Data Fasting: No Labs: No Today's Visit #: 26 Starting Date: 03/14/21     HPI  Chief Complaint: OBESITY  Rachel Woodard is here to discuss her progress with her obesity treatment plan. She is on the the Category 3 Plan and states she is following her eating plan approximately 50 % of the time. She states she is exercising 0 minutes 0 days per week.   Interval History:  Since last office visit on 01/09/23 she has lost 2 pounds.  She is "back and forth with following the meal plan".  She got a new dog 1 month ago and has been more active walking him.  She plans to start going to the gym. She is here to get back on track.  Notes polyphagia and cravings.     Pharmacotherapy for weight loss: She is not currently taking medications  for medical weight loss.       Previous pharmacotherapy for medical weight loss:  OTC meds   Bariatric surgery:  She is status post gastric bypass by in [place] in 2001.  Her highest weight prior to surgery was 286 lbs and her nadir weight after surgery was 186 lbs.  Taking Vit D, Calcium and Vit B12.  She is not taking a multivitamin or iron.   She did a Radiation protection practitioner with Dr. Adolphus Birchwood but doesn't want to make an appt at this time.    PHYSICAL EXAM:  Blood pressure 136/80, pulse 76, temperature 97.8 F (36.6 C), height 5\' 6"   (1.676 m), weight 252 lb (114.3 kg), SpO2 97%. Body mass index is 40.67 kg/m.  General: She is overweight, cooperative, alert, well developed, and in no acute distress. PSYCH: Has normal mood, affect and thought process.   Extremities: No edema.  Neurologic: No gross sensory or motor deficits. No tremors or fasciculations noted.    DIAGNOSTIC DATA REVIEWED:  BMET    Component Value Date/Time   NA 140 08/27/2022 0000   K 4.4 08/27/2022 0000   CL 103 08/27/2022 0000   CO2 31 (A) 08/27/2022 0000   GLUCOSE 100 (H) 06/13/2022 1225   GLUCOSE 115 (H) 12/23/2018 0521   BUN 30 (A) 08/27/2022 0000   CREATININE 1.1 08/27/2022 0000   CREATININE 1.18 (H) 06/13/2022 1225   CREATININE 0.95 08/08/2016 1102   CALCIUM 9.7 08/27/2022 0000   GFRNONAA 41 (L) 12/23/2018 0521   GFRAA 47 (L) 12/23/2018 0521   Lab Results  Component Value Date   HGBA1C 6.0 (H) 06/13/2022   HGBA1C 5.7 08/08/2016   Lab Results  Component Value Date   INSULIN 14.4 03/14/2021   Lab Results  Component Value Date   TSH 3.320 03/14/2021   CBC    Component Value Date/Time   WBC 7.2 08/27/2022 0000   WBC 8.7 12/23/2018 0521   RBC 4.64 08/27/2022  0000   HGB 13.2 08/27/2022 0000   HGB 13.3 03/14/2021 0844   HCT 40 08/27/2022 0000   HCT 40.5 03/14/2021 0844   PLT 262 08/27/2022 0000   PLT 255 03/14/2021 0844   MCV 88 03/14/2021 0844   MCH 28.7 03/14/2021 0844   MCH 27.9 12/23/2018 0521   MCHC 32.8 03/14/2021 0844   MCHC 31.9 12/23/2018 0521   RDW 14.5 03/14/2021 0844   Iron Studies    Component Value Date/Time   FERRITIN 17 06/13/2022 1225   Lipid Panel     Component Value Date/Time   CHOL 162 08/27/2022 0000   CHOL 170 03/14/2021 0844   TRIG 115 08/27/2022 0000   HDL 55 08/27/2022 0000   HDL 53 03/14/2021 0844   CHOLHDL 3.1 07/11/2017 1107   CHOLHDL 3.0 08/08/2016 1102   VLDL 17 08/08/2016 1102   LDLCALC 87 08/27/2022 0000   LDLCALC 99 03/14/2021 0844   Hepatic Function Panel      Component Value Date/Time   PROT 6.9 06/13/2022 1225   ALBUMIN 4.1 06/13/2022 1225   AST 14 08/27/2022 0000   ALT 17 08/27/2022 0000   ALKPHOS 114 06/13/2022 1225   BILITOT 0.2 06/13/2022 1225   BILIDIR 0.1 08/08/2016 1102   IBILI 0.3 08/08/2016 1102      Component Value Date/Time   TSH 3.320 03/14/2021 0844   Nutritional Lab Results  Component Value Date   VD25OH 36.9 06/13/2022   VD25OH 33.0 03/14/2021   VD25OH 27.7 (L) 04/24/2018     ASSESSMENT AND PLAN  TREATMENT PLAN FOR OBESITY:  Recommended Dietary Goals  Rachel Woodard is currently in the action stage of change. As such, her goal is to continue weight management plan. She has agreed to the Category 3 Plan.  Behavioral Intervention  We discussed the following Behavioral Modification Strategies today: increasing lean protein intake to established goals, decreasing simple carbohydrates , increasing vegetables, increasing water intake , work on meal planning and preparation, celebration eating strategies, and continue to work on maintaining a reduced calorie state, getting the recommended amount of protein, incorporating whole foods, making healthy choices, staying well hydrated and practicing mindfulness when eating..  Additional resources provided today: NA  Recommended Physical Activity Goals  [name] has been advised to work up to 150 minutes of moderate intensity aerobic activity a week and strengthening exercises 2-3 times per week for cardiovascular health, weight loss maintenance and preservation of muscle mass.   She has agreed to Think about enjoyable ways to increase daily physical activity and overcoming barriers to exercise, Increase physical activity in their day and reduce sedentary time (increase NEAT)., and Work on scheduling and tracking physical activity.    Pharmacotherapy We discussed various medication options to help Rachel Woodard with her weight loss efforts and we both agreed to start [facility] 0.25mg .   Side effects discussed. She has a history of HTN, OSAS, GERD, OA knees, CKD, breast cancer.  If approved, will bring to next visit to give in the office.    Contraindications:  Pancreatitis (active gallstones) Medullary thyroid cancer High triglycerides (>500)-will need labs prior to starting Multiple Endocrine Neoplasia syndrome type 2 (MEN 2) Trying to get pregnant Breastfeeding Use with caution with taking insulin or sulfonylureas (will need to monitor blood sugars for hypoglycemia)  ASSOCIATED CONDITIONS ADDRESSED TODAY  Action/Plan  Postoperative malabsorption -     Hemoglobin A1c -     Vitamin B1 -     PTH, intact and calcium -  Prealbumin -     Vitamin B12 -     Folate -     Ferritin -     VITAMIN D 25 Hydroxy (Vit-D Deficiency, Fractures) -     TSH -     Comprehensive metabolic panel -     CBC with Differential/Platelet  History of Roux-en-Y gastric bypass (2001) -     Hemoglobin A1c -     Vitamin B1 -     PTH, intact and calcium -     Prealbumin -     Vitamin B12 -     Folate -     Ferritin -     VITAMIN D 25 Hydroxy (Vit-D Deficiency, Fractures) -     TSH -     Comprehensive metabolic panel -     CBC with Differential/Platelet  Morbid obesity (HCC) -     ZOXWRU; Inject 0.25 mg into the skin once a week.  Dispense: 2 mL; Refill: 0  BMI 40.0-44.9, adult (HCC)         Return in about 4 weeks (around 07/04/2023).Marland Kitchen She was informed of the importance of frequent follow up visits to maximize her success with intensive lifestyle modifications for her multiple health conditions.   ATTESTASTION STATEMENTS:  Reviewed by clinician on day of visit: allergies, medications, problem list, medical history, surgical history, family history, social history, and previous encounter notes.     Theodis Sato. Mycah Mcdougall FNP-C

## 2023-06-06 NOTE — Patient Instructions (Signed)

## 2023-06-06 NOTE — Telephone Encounter (Signed)
PA submitted through Cover My Meds for [facility]. Awaiting insurance determination. Key: [id number]

## 2023-06-10 NOTE — Telephone Encounter (Signed)
Received fax from [facility] that Reginal Lutes has been denied.  [employer] form printed and filled out. Faxed form, denial letter and notes to Medicaid at [phone].

## 2023-06-13 LAB — FOLATE: Folate: 13.2 ng/mL (ref >3.0)

## 2023-06-13 LAB — CBC WITH DIFFERENTIAL/PLATELET
Basophils Absolute: 0 10*3/uL (ref 0.0–0.2)
Basos: 1 %
EOS (ABSOLUTE): 0.1 10*3/uL (ref 0.0–0.4)
Eos: 1 %
Hematocrit: 41.7 % (ref 34.0–46.6)
Hemoglobin: 13.3 g/dL (ref 11.1–15.9)
Immature Grans (Abs): 0 10*3/uL (ref 0.0–0.1)
Immature Granulocytes: 1 %
Lymphocytes Absolute: 2.3 10*3/uL (ref 0.7–3.1)
Lymphs: 34 %
MCH: 27.8 pg (ref 26.6–33.0)
MCHC: 31.9 g/dL (ref 31.5–35.7)
MCV: 87 fL (ref 79–97)
Monocytes Absolute: 0.7 10*3/uL (ref 0.1–0.9)
Monocytes: 10 %
Neutrophils Absolute: 3.6 10*3/uL (ref 1.4–7.0)
Neutrophils: 53 %
Platelets: 276 10*3/uL (ref 150–450)
RBC: 4.79 x10E6/uL (ref 3.77–5.28)
RDW: 14.9 % (ref 11.7–15.4)
WBC: 6.7 10*3/uL (ref 3.4–10.8)

## 2023-06-13 LAB — HEMOGLOBIN A1C
Est. average glucose Bld gHb Est-mCnc: 126 mg/dL
Hgb A1c MFr Bld: 6 % — ABNORMAL HIGH (ref 4.8–5.6)

## 2023-06-13 LAB — PTH, INTACT AND CALCIUM: PTH: 26 pg/mL (ref 15–65)

## 2023-06-13 LAB — TSH: TSH: 1.35 u[IU]/mL (ref 0.450–4.500)

## 2023-06-13 LAB — COMPREHENSIVE METABOLIC PANEL
ALT: 12 [IU]/L (ref 0–32)
AST: 14 [IU]/L (ref 0–40)
Albumin: 4.1 g/dL (ref 3.9–4.9)
Alkaline Phosphatase: 107 [IU]/L (ref 44–121)
BUN/Creatinine Ratio: 12 (ref 12–28)
BUN: 14 mg/dL (ref 8–27)
Bilirubin Total: 0.3 mg/dL (ref 0.0–1.2)
CO2: 25 mmol/L (ref 20–29)
Calcium: 9.1 mg/dL (ref 8.7–10.3)
Chloride: 105 mmol/L (ref 96–106)
Creatinine, Ser: 1.18 mg/dL — ABNORMAL HIGH (ref 0.57–1.00)
Globulin, Total: 2.8 g/dL (ref 1.5–4.5)
Glucose: 85 mg/dL (ref 70–99)
Potassium: 4.4 mmol/L (ref 3.5–5.2)
Sodium: 143 mmol/L (ref 134–144)
Total Protein: 6.9 g/dL (ref 6.0–8.5)
eGFR: 50 mL/min/{1.73_m2} — ABNORMAL LOW (ref >59)

## 2023-06-13 LAB — VITAMIN D 25 HYDROXY (VIT D DEFICIENCY, FRACTURES): Vit D, 25-Hydroxy: 63.2 ng/mL (ref 30.0–100.0)

## 2023-06-13 LAB — VITAMIN B12: Vitamin B-12: 517 pg/mL (ref 232–1245)

## 2023-06-13 LAB — VITAMIN B1: Thiamine: 140.1 nmol/L (ref 66.5–200.0)

## 2023-06-13 LAB — PREALBUMIN: PREALBUMIN: 21 mg/dL (ref 10–36)

## 2023-06-13 LAB — FERRITIN: Ferritin: 17 ng/mL (ref 15–150)

## 2023-06-27 ENCOUNTER — Ambulatory Visit: Payer: Medicare HMO | Admitting: Nurse Practitioner

## 2023-07-03 ENCOUNTER — Ambulatory Visit: Payer: Medicare HMO | Admitting: Bariatrics

## 2023-07-11 ENCOUNTER — Encounter: Payer: Self-pay | Admitting: Bariatrics

## 2023-07-11 ENCOUNTER — Ambulatory Visit (INDEPENDENT_AMBULATORY_CARE_PROVIDER_SITE_OTHER): Payer: Medicare HMO | Admitting: Bariatrics

## 2023-07-11 VITALS — BP 169/75 | HR 66 | Temp 98.1°F | Ht 66.0 in | Wt 246.0 lb

## 2023-07-11 DIAGNOSIS — Z0000 Encounter for general adult medical examination without abnormal findings: Secondary | ICD-10-CM

## 2023-07-11 DIAGNOSIS — E559 Vitamin D deficiency, unspecified: Secondary | ICD-10-CM | POA: Diagnosis not present

## 2023-07-11 DIAGNOSIS — E669 Obesity, unspecified: Secondary | ICD-10-CM | POA: Diagnosis not present

## 2023-07-11 DIAGNOSIS — Z6839 Body mass index (BMI) 39.0-39.9, adult: Secondary | ICD-10-CM

## 2023-07-11 DIAGNOSIS — R7303 Prediabetes: Secondary | ICD-10-CM | POA: Diagnosis not present

## 2023-07-11 DIAGNOSIS — I10 Essential (primary) hypertension: Secondary | ICD-10-CM | POA: Diagnosis not present

## 2023-07-11 MED ORDER — VITAMIN D (ERGOCALCIFEROL) 1.25 MG (50000 UNIT) PO CAPS: 50000.0000 [IU] | ORAL_CAPSULE | ORAL | 0 refills | Status: DC

## 2023-07-11 NOTE — Progress Notes (Signed)
WEIGHT SUMMARY AND BIOMETRICS  Weight Lost Since Last Visit: 6lb  Weight Gained Since Last Visit: 0   Vitals Temp: 98.1 F (36.7 C) BP: (!) 169/75 Pulse Rate: 66 SpO2: 100 %   Anthropometric Measurements Height: 5\' 6"  (1.676 m) Weight: 246 lb (111.6 kg) BMI (Calculated): 39.72 Weight at Last Visit: 252lb Weight Lost Since Last Visit: 6lb Weight Gained Since Last Visit: 0 Starting Weight: 258lb Total Weight Loss (lbs): 12 lb (5.443 kg)   Body Composition  Body Fat %: 51.8 % Fat Mass (lbs): 127.8 lbs Muscle Mass (lbs): 113 lbs Visceral Fat Rating : 17   Other Clinical Data Fasting: no Labs: no Today's Visit #: 27 Starting Date: 03/14/21    OBESITY Ivor is here to discuss her progress with her obesity treatment plan along with follow-up of her obesity related diagnoses.    Nutrition Plan: the Category 3 plan - 10% adherence.  Current exercise:  Walks her dog.  Interim History:  She is down 6 lbs since her last visit. She was prescribed [facility] but it was denied.  Not eating all of the food on the plan., Protein intake is as prescribed, Is not skipping meals, Water intake is adequate., and Denies polyphagia   Pharmacotherapy: Alis is not on any weight medications at this time. Adverse side effects: N/A Hunger is moderately controlled.  Cravings are moderately controlled.  Assessment/Plan:   1. Vitamin D deficiency Vitamin D Deficiency Vitamin D is at goal of 50.  Most recent vitamin D level was 63.2. She is on  prescription ergocalciferol 50,000 IU weekly. Lab Results  Component Value Date   VD25OH 63.2 06/06/2023   VD25OH 36.9 06/13/2022   VD25OH 33.0 03/14/2021    Plan: Refill prescription vitamin D 50,000 IU weekly.   Prediabetes Last A1c was 6.0  Medication(s): none Lab Results  Component Value Date   HGBA1C 6.0 (H)  06/06/2023   HGBA1C 6.0 (H) 06/13/2022   HGBA1C 6.0 (H) 03/14/2021   HGBA1C 5.7 (H) 11/11/2018   HGBA1C 5.8 (H) 04/24/2018   Lab Results  Component Value Date   INSULIN 14.4 03/14/2021    Plan: Will minimize all refined carbohydrates both sweets and starches.  Will work on the plan and exercise.  Consider both aerobic and resistance training.  She will continue her HIIT activity and walking the stairs.  She will cut back on her bread specifically.   Hypertension Hypertension control uncertain.  Medication(s): Coreg 3.125 mg twice daily  and Hydrochlorothiazide 25 mg daily   BP Readings from Last 3 Encounters:  07/11/23 (!) 169/75  06/06/23 136/80  01/09/23 126/80   Lab Results  Component Value Date   CREATININE 1.18 (H) 06/06/2023   CREATININE 1.1 08/27/2022   CREATININE 1.18 (H) 06/13/2022    Plan: Continue all antihypertensives at current dosages. She will check her blood pressure at home.  No added salt. Will keep sodium content to 1,500 mg or less per day.    Labs done today (Lipids).    Generalized Obesity: Current BMI BMI (Calculated): 39.72   Healthcare maintenance:   She recently had labs but her lipids were overlooked and she wants to have her lipids done today.  She is not on any medications for lipids at this time  Plan: Will check lipids.   Cesia is currently in the action stage of change. As such, her goal is to continue with weight loss efforts.  She has agreed to the Category 3 plan.  Exercise goals: Older adults should determine their level of effort for physical activity relative to their level of fitness.   Behavioral modification strategies: increasing lean protein intake, decrease eating out, meal planning , increase water intake, better snacking choices, planning for success, decrease snacking , avoiding temptations, and keep healthy foods in the home.  Eugenia has agreed to follow-up with our clinic in 4 weeks with [facility].       Objective:   VITALS: Per patient if applicable, see vitals. GENERAL: Alert and in no acute distress. CARDIOPULMONARY: No increased WOB. Speaking in clear sentences.  PSYCH: Pleasant and cooperative. Speech normal rate and rhythm. Affect is appropriate. Insight and judgement are appropriate. Attention is focused, linear, and appropriate.  NEURO: Oriented as arrived to appointment on time with no prompting.   Attestation Statements:   This was prepared with the assistance of Engineer, civil (consulting).  Occasional wrong-word or sound-a-like substitutions may have occurred due to the inherent limitations of voice recognition   Corinna Capra, DO

## 2023-07-12 LAB — LIPID PANEL WITH LDL/HDL RATIO
Cholesterol, Total: 170 mg/dL (ref 100–199)
HDL: 64 mg/dL (ref >39)
LDL Chol Calc (NIH): 93 mg/dL (ref 0–99)
LDL/HDL Ratio: 1.5 {ratio} (ref 0.0–3.2)
Triglycerides: 67 mg/dL (ref 0–149)
VLDL Cholesterol Cal: 13 mg/dL (ref 5–40)

## 2023-07-15 ENCOUNTER — Encounter: Payer: Self-pay | Admitting: Bariatrics

## 2023-08-14 ENCOUNTER — Other Ambulatory Visit: Payer: Self-pay | Admitting: Bariatrics

## 2023-08-14 ENCOUNTER — Ambulatory Visit: Payer: Medicare HMO | Admitting: Nurse Practitioner

## 2023-08-14 DIAGNOSIS — E559 Vitamin D deficiency, unspecified: Secondary | ICD-10-CM

## 2023-08-29 ENCOUNTER — Encounter: Payer: Self-pay | Admitting: Nurse Practitioner

## 2023-08-29 ENCOUNTER — Ambulatory Visit: Payer: Medicare HMO | Admitting: Nurse Practitioner

## 2023-08-29 VITALS — BP 140/78 | HR 67 | Temp 97.8°F | Ht 66.0 in | Wt 233.0 lb

## 2023-08-29 DIAGNOSIS — E559 Vitamin D deficiency, unspecified: Secondary | ICD-10-CM

## 2023-08-29 DIAGNOSIS — E66812 Obesity, class 2: Secondary | ICD-10-CM

## 2023-08-29 DIAGNOSIS — Z6837 Body mass index (BMI) 37.0-37.9, adult: Secondary | ICD-10-CM

## 2023-08-29 DIAGNOSIS — I10 Essential (primary) hypertension: Secondary | ICD-10-CM | POA: Diagnosis not present

## 2023-08-29 MED ORDER — VITAMIN D (ERGOCALCIFEROL) 1.25 MG (50000 UNIT) PO CAPS: 50000.0000 [IU] | ORAL_CAPSULE | ORAL | 0 refills | Status: AC

## 2023-08-29 NOTE — Progress Notes (Signed)
 Office: [phone]  /  Fax: [phone]  WEIGHT SUMMARY AND BIOMETRICS  Weight Lost Since Last Visit: 13lb  Weight Gained Since Last Visit: 0lb   Vitals Temp: 97.8 F (36.6 C) BP: (!) 140/78 Pulse Rate: 67 SpO2: 95 %   Anthropometric Measurements Height: 5\' 6"  (1.676 m) Weight: 233 lb (105.7 kg) BMI (Calculated): 37.63 Weight at Last Visit: 246lb Weight Lost Since Last Visit: 13lb Weight Gained Since Last Visit: 0lb Starting Weight: 258lb Total Weight Loss (lbs): 25 lb (11.3 kg)   Body Composition  Body Fat %: 50.2 % Fat Mass (lbs): 117.4 lbs Muscle Mass (lbs): 110.6 lbs Visceral Fat Rating : 16   Other Clinical Data Fasting: Yes Labs: No Today's Visit #: 28 Starting Date: 03/14/21     HPI  Chief Complaint: OBESITY  Rachel Woodard is here to discuss her progress with her obesity treatment plan. She is on the the Category 3 Plan and states she is following her eating plan approximately 0 % of the time. She states she is exercising 60 minutes 5 days per week.   Interval History:  Since last office visit she has lost 13 pounds.  She contributes her weight loss due to walking her dog.  She is moving to [place] next month.    Pharmacotherapy for weight loss: She is not currently taking medications  for medical weight loss.      Unable to start GLP-1 due to cost.    Previous pharmacotherapy for medical weight loss:  OTC meds   Bariatric surgery:  She is status post gastric bypass by in [place] in 2001.  Her highest weight prior to surgery was 286 lbs and her nadir weight after surgery was 186 lbs.  She is taking Vit D, Calcium.  She is not taking a multivitamin or iron.   Hypertension Hypertension needs improvement.  Medication(s): Coreg 3.125 mg twice daily and Hydrochlorothiazide 25 mg daily.  Denies side effects.  Denies chest pain, palpitations and SOB.  BP Readings from Last 3 Encounters:  08/29/23 (!) 140/78  07/11/23 (!) 169/75  06/06/23 136/80    Lab Results  Component Value Date   CREATININE 1.18 (H) 06/06/2023   CREATININE 1.1 08/27/2022   CREATININE 1.18 (H) 06/13/2022      PHYSICAL EXAM:  Blood pressure (!) 140/78, pulse 67, temperature 97.8 F (36.6 C), height 5\' 6"  (1.676 m), weight 233 lb (105.7 kg), SpO2 95%. Body mass index is 37.61 kg/m.  General: She is overweight, cooperative, alert, well developed, and in no acute distress. PSYCH: Has normal mood, affect and thought process.   Extremities: No edema.  Neurologic: No gross sensory or motor deficits. No tremors or fasciculations noted.    DIAGNOSTIC DATA REVIEWED:  BMET    Component Value Date/Time   NA 143 06/06/2023 1513   K 4.4 06/06/2023 1513   CL 105 06/06/2023 1513   CO2 25 06/06/2023 1513   GLUCOSE 85 06/06/2023 1513   GLUCOSE 115 (H) 12/23/2018 0521   BUN 14 06/06/2023 1513   CREATININE 1.18 (H) 06/06/2023 1513   CREATININE 0.95 08/08/2016 1102   CALCIUM 9.1 06/06/2023 1513   GFRNONAA 41 (L) 12/23/2018 0521   GFRAA 47 (L) 12/23/2018 0521   Lab Results  Component Value Date   HGBA1C 6.0 (H) 06/06/2023   HGBA1C 5.7 08/08/2016   Lab Results  Component Value Date   INSULIN 14.4 03/14/2021   Lab Results  Component Value Date   TSH 1.350 06/06/2023   CBC  Component Value Date/Time   WBC 6.7 06/06/2023 1513   WBC 8.7 12/23/2018 0521   RBC 4.79 06/06/2023 1513   RBC 4.64 08/27/2022 0000   HGB 13.3 06/06/2023 1513   HCT 41.7 06/06/2023 1513   PLT 276 06/06/2023 1513   MCV 87 06/06/2023 1513   MCH 27.8 06/06/2023 1513   MCH 27.9 12/23/2018 0521   MCHC 31.9 06/06/2023 1513   MCHC 31.9 12/23/2018 0521   RDW 14.9 06/06/2023 1513   Iron Studies    Component Value Date/Time   FERRITIN 17 06/06/2023 1513   Lipid Panel     Component Value Date/Time   CHOL 170 07/11/2023 1509   TRIG 67 07/11/2023 1509   HDL 64 07/11/2023 1509   CHOLHDL 3.1 07/11/2017 1107   CHOLHDL 3.0 08/08/2016 1102   VLDL 17 08/08/2016 1102   LDLCALC  93 07/11/2023 1509   Hepatic Function Panel     Component Value Date/Time   PROT 6.9 06/06/2023 1513   ALBUMIN 4.1 06/06/2023 1513   AST 14 06/06/2023 1513   ALT 12 06/06/2023 1513   ALKPHOS 107 06/06/2023 1513   BILITOT 0.3 06/06/2023 1513   BILIDIR 0.1 08/08/2016 1102   IBILI 0.3 08/08/2016 1102      Component Value Date/Time   TSH 1.350 06/06/2023 1513   Nutritional Lab Results  Component Value Date   VD25OH 63.2 06/06/2023   VD25OH 36.9 06/13/2022   VD25OH 33.0 03/14/2021     ASSESSMENT AND PLAN  TREATMENT PLAN FOR OBESITY:  Recommended Dietary Goals  Rachel Woodard is currently in the action stage of change. As such, her goal is to continue weight management plan. She has agreed to the Category 3 Plan.  Needs to work on Emergency planning/management officer and increasing water and protein intake.    Behavioral Intervention  We discussed the following Behavioral Modification Strategies today: increasing lean protein intake to established goals, decreasing simple carbohydrates , increasing vegetables, increasing water intake , work on meal planning and preparation, reading food labels , keeping healthy foods at home, continue to work on implementation of reduced calorie nutritional plan, continue to practice mindfulness when eating, planning for success, and continue to work on maintaining a reduced calorie state, getting the recommended amount of protein, incorporating whole foods, making healthy choices, staying well hydrated and practicing mindfulness when eating..  Additional resources provided today: NA  Recommended Physical Activity Goals  Rachel Woodard has been advised to work up to 150 minutes of moderate intensity aerobic activity a week and strengthening exercises 2-3 times per week for cardiovascular health, weight loss maintenance and preservation of muscle mass.   She has agreed to Think about enjoyable ways to increase daily physical activity and overcoming barriers to exercise,  Increase physical activity in their day and reduce sedentary time (increase NEAT)., and Start strengthening exercises with a goal of 2-3 sessions a week    ASSOCIATED CONDITIONS ADDRESSED TODAY  Action/Plan  Essential hypertension Has appt with PCP on 09/06/23-keep follow up appt.  Continue meds as directed.   Vitamin D deficiency -     Vitamin D (Ergocalciferol); Take 1 capsule (50,000 Units total) by mouth every 10-14 (seven) days.  Dispense: 12 capsule; Refill: 0.  Side effects discussed.   Class 2 severe obesity due to excess calories with serious comorbidity and body mass index (BMI) of 37.0 to 37.9 in adult [facility])  To return PRN.    Return if symptoms worsen or fail to improve.Marland Kitchen She was informed of the importance  of frequent follow up visits to maximize her success with intensive lifestyle modifications for her multiple health conditions.   ATTESTASTION STATEMENTS:  Reviewed by clinician on day of visit: allergies, medications, problem list, medical history, surgical history, family history, social history, and previous encounter notes.     Theodis Sato. Haily Caley FNP-C

## 2023-09-02 DIAGNOSIS — R1310 Dysphagia, unspecified: Secondary | ICD-10-CM | POA: Diagnosis not present

## 2023-09-02 DIAGNOSIS — Z0000 Encounter for general adult medical examination without abnormal findings: Secondary | ICD-10-CM | POA: Diagnosis not present

## 2023-09-02 DIAGNOSIS — Z853 Personal history of malignant neoplasm of breast: Secondary | ICD-10-CM | POA: Diagnosis not present

## 2023-09-02 DIAGNOSIS — M797 Fibromyalgia: Secondary | ICD-10-CM | POA: Diagnosis not present

## 2023-09-02 DIAGNOSIS — Z23 Encounter for immunization: Secondary | ICD-10-CM | POA: Diagnosis not present

## 2023-09-02 DIAGNOSIS — K219 Gastro-esophageal reflux disease without esophagitis: Secondary | ICD-10-CM | POA: Diagnosis not present

## 2023-09-02 DIAGNOSIS — F4310 Post-traumatic stress disorder, unspecified: Secondary | ICD-10-CM | POA: Diagnosis not present

## 2023-09-02 DIAGNOSIS — F3341 Major depressive disorder, recurrent, in partial remission: Secondary | ICD-10-CM | POA: Diagnosis not present

## 2023-09-02 DIAGNOSIS — Z923 Personal history of irradiation: Secondary | ICD-10-CM | POA: Diagnosis not present

## 2023-09-19 ENCOUNTER — Emergency Department (HOSPITAL_COMMUNITY)
Admission: EM | Admit: 2023-09-19 | Discharge: 2023-09-20 | Disposition: A | Discharge status: Other Acute Inpatient Hospital | Attending: Emergency Medicine | Admitting: Emergency Medicine

## 2023-09-19 ENCOUNTER — Other Ambulatory Visit: Payer: Self-pay

## 2023-09-19 ENCOUNTER — Encounter (HOSPITAL_COMMUNITY): Payer: Self-pay

## 2023-09-19 DIAGNOSIS — F29 Unspecified psychosis not due to a substance or known physiological condition: Secondary | ICD-10-CM | POA: Diagnosis not present

## 2023-09-19 DIAGNOSIS — E876 Hypokalemia: Secondary | ICD-10-CM | POA: Diagnosis not present

## 2023-09-19 DIAGNOSIS — I10 Essential (primary) hypertension: Secondary | ICD-10-CM | POA: Diagnosis not present

## 2023-09-19 DIAGNOSIS — F3162 Bipolar disorder, current episode mixed, moderate: Secondary | ICD-10-CM | POA: Insufficient documentation

## 2023-09-19 DIAGNOSIS — I129 Hypertensive chronic kidney disease with stage 1 through stage 4 chronic kidney disease, or unspecified chronic kidney disease: Secondary | ICD-10-CM | POA: Diagnosis not present

## 2023-09-19 DIAGNOSIS — Z79899 Other long term (current) drug therapy: Secondary | ICD-10-CM | POA: Diagnosis not present

## 2023-09-19 DIAGNOSIS — F32A Anxiety disorder, unspecified: Secondary | ICD-10-CM | POA: Diagnosis present

## 2023-09-19 DIAGNOSIS — Z853 Personal history of malignant neoplasm of breast: Secondary | ICD-10-CM | POA: Insufficient documentation

## 2023-09-19 DIAGNOSIS — Z96652 Presence of left artificial knee joint: Secondary | ICD-10-CM | POA: Diagnosis not present

## 2023-09-19 DIAGNOSIS — F3163 Bipolar disorder, current episode mixed, severe, without psychotic features: Secondary | ICD-10-CM

## 2023-09-19 DIAGNOSIS — F319 Bipolar disorder, unspecified: Secondary | ICD-10-CM | POA: Diagnosis not present

## 2023-09-19 DIAGNOSIS — F418 Other specified anxiety disorders: Secondary | ICD-10-CM | POA: Diagnosis not present

## 2023-09-19 DIAGNOSIS — Y900 Blood alcohol level of less than 20 mg/100 ml: Secondary | ICD-10-CM | POA: Diagnosis not present

## 2023-09-19 DIAGNOSIS — N1832 Chronic kidney disease, stage 3b: Secondary | ICD-10-CM | POA: Insufficient documentation

## 2023-09-19 DIAGNOSIS — F419 Anxiety disorder, unspecified: Secondary | ICD-10-CM | POA: Diagnosis present

## 2023-09-19 DIAGNOSIS — J449 Chronic obstructive pulmonary disease, unspecified: Secondary | ICD-10-CM | POA: Insufficient documentation

## 2023-09-19 DIAGNOSIS — Z720 Tobacco use: Secondary | ICD-10-CM | POA: Insufficient documentation

## 2023-09-19 LAB — RAPID URINE DRUG SCREEN, HOSP PERFORMED
Amphetamines: NOT DETECTED
Barbiturates: NOT DETECTED
Benzodiazepines: NOT DETECTED
Cocaine: NOT DETECTED
Opiates: NOT DETECTED
Tetrahydrocannabinol: NOT DETECTED

## 2023-09-19 LAB — ETHANOL: Alcohol, Ethyl (B): <10 mg/dL (ref <10)

## 2023-09-19 LAB — CBC
HCT: 44.8 % (ref 36.0–46.0)
Hemoglobin: 14.1 g/dL (ref 12.0–15.0)
MCH: 27.5 pg (ref 26.0–34.0)
MCHC: 31.5 g/dL (ref 30.0–36.0)
MCV: 87.5 fL (ref 80.0–100.0)
Platelets: 280 10*3/uL (ref 150–400)
RBC: 5.12 MIL/uL — ABNORMAL HIGH (ref 3.87–5.11)
RDW: 15.5 % (ref 11.5–15.5)
WBC: 6.9 10*3/uL (ref 4.0–10.5)
nRBC: 0 % (ref 0.0–0.2)

## 2023-09-19 LAB — URINALYSIS, W/ REFLEX TO CULTURE (INFECTION SUSPECTED)
Bacteria, UA: NONE SEEN
Bilirubin Urine: NEGATIVE
Glucose, UA: NEGATIVE mg/dL
Hgb urine dipstick: NEGATIVE
Ketones, ur: NEGATIVE mg/dL
Leukocytes,Ua: NEGATIVE
Nitrite: NEGATIVE
Protein, ur: NEGATIVE mg/dL
Specific Gravity, Urine: 1.006 (ref 1.005–1.030)
pH: 6 (ref 5.0–8.0)

## 2023-09-19 LAB — COMPREHENSIVE METABOLIC PANEL WITH GFR
ALT: 19 U/L (ref 0–44)
AST: 25 U/L (ref 15–41)
Albumin: 3.9 g/dL (ref 3.5–5.0)
Alkaline Phosphatase: 74 U/L (ref 38–126)
Anion gap: 10 (ref 5–15)
BUN: 22 mg/dL (ref 8–23)
CO2: 23 mmol/L (ref 22–32)
Calcium: 9.5 mg/dL (ref 8.9–10.3)
Chloride: 106 mmol/L (ref 98–111)
Creatinine, Ser: 1.43 mg/dL — ABNORMAL HIGH (ref 0.44–1.00)
GFR, Estimated: 39 mL/min — ABNORMAL LOW (ref >60)
Glucose, Bld: 155 mg/dL — ABNORMAL HIGH (ref 70–99)
Potassium: 3.3 mmol/L — ABNORMAL LOW (ref 3.5–5.1)
Sodium: 139 mmol/L (ref 135–145)
Total Bilirubin: 0.7 mg/dL (ref 0.0–1.2)
Total Protein: 7.7 g/dL (ref 6.5–8.1)

## 2023-09-19 LAB — CBG MONITORING, ED: Glucose-Capillary: 199 mg/dL — ABNORMAL HIGH (ref 70–99)

## 2023-09-19 LAB — SARS CORONAVIRUS 2 BY RT PCR: SARS Coronavirus 2 by RT PCR: NEGATIVE

## 2023-09-19 MED ORDER — QUETIAPINE FUMARATE 100 MG PO TABS
200.0000 mg | ORAL_TABLET | Freq: Every day | ORAL | Status: DC
  Administered 2023-09-19: 200 mg via ORAL
  Filled 2023-09-19: qty 2

## 2023-09-19 MED ORDER — CALCIUM CARBONATE 600 MG PO TABS: 600.0000 mg | ORAL_TABLET | Freq: Two times a day (BID) | ORAL | Status: DC

## 2023-09-19 MED ORDER — SUCRALFATE 1 G PO TABS
1.0000 g | ORAL_TABLET | Freq: Three times a day (TID) | ORAL | Status: DC
  Administered 2023-09-19 (×2): 1 g via ORAL
  Filled 2023-09-19 (×3): qty 1

## 2023-09-19 MED ORDER — PANTOPRAZOLE SODIUM 40 MG PO TBEC: 40.0000 mg | DELAYED_RELEASE_TABLET | Freq: Every morning | ORAL | Status: DC

## 2023-09-19 MED ORDER — ESCITALOPRAM OXALATE 10 MG PO TABS: 10.0000 mg | ORAL_TABLET | Freq: Every day | ORAL | Status: DC

## 2023-09-19 MED ORDER — NICOTINE 21 MG/24HR TD PT24
21.0000 mg | MEDICATED_PATCH | Freq: Every day | TRANSDERMAL | Status: DC
  Administered 2023-09-19: 21 mg via TRANSDERMAL
  Filled 2023-09-19: qty 1

## 2023-09-19 MED ORDER — LAMOTRIGINE 25 MG PO TABS: 325.0000 mg | ORAL_TABLET | Freq: Every day | ORAL | Status: DC

## 2023-09-19 MED ORDER — ADULT MULTIVITAMIN W/MINERALS CH
1.0000 | ORAL_TABLET | Freq: Every day | ORAL | Status: DC
  Administered 2023-09-19: 1 via ORAL
  Filled 2023-09-19: qty 1

## 2023-09-19 MED ORDER — CARVEDILOL 3.125 MG PO TABS
3.1250 mg | ORAL_TABLET | Freq: Two times a day (BID) | ORAL | Status: DC
  Administered 2023-09-19: 3.125 mg via ORAL
  Filled 2023-09-19: qty 1

## 2023-09-19 MED ORDER — VITAMIN D 25 MCG (1000 UNIT) PO TABS
1000.0000 [IU] | ORAL_TABLET | Freq: Every day | ORAL | Status: DC
  Administered 2023-09-19: 1000 [IU] via ORAL
  Filled 2023-09-19: qty 1

## 2023-09-19 MED ORDER — CALCIUM CARBONATE 1250 (500 CA) MG PO TABS
1.0000 | ORAL_TABLET | Freq: Two times a day (BID) | ORAL | Status: DC
  Filled 2023-09-19: qty 1

## 2023-09-19 MED ORDER — LAMOTRIGINE 100 MG PO TABS: 200.0000 mg | ORAL_TABLET | Freq: Every day | ORAL | Status: DC

## 2023-09-19 MED ORDER — ESCITALOPRAM OXALATE 10 MG PO TABS: 20.0000 mg | ORAL_TABLET | Freq: Every day | ORAL | Status: DC

## 2023-09-19 MED ORDER — BUPROPION HCL ER (XL) 150 MG PO TB24: 150.0000 mg | ORAL_TABLET | Freq: Every morning | ORAL | Status: DC

## 2023-09-19 MED ORDER — HYDROCHLOROTHIAZIDE 12.5 MG PO TABS
25.0000 mg | ORAL_TABLET | Freq: Every day | ORAL | Status: DC
  Administered 2023-09-19: 25 mg via ORAL
  Filled 2023-09-19: qty 2

## 2023-09-19 MED ORDER — BUPROPION HCL ER (XL) 150 MG PO TB24: 300.0000 mg | ORAL_TABLET | Freq: Every day | ORAL | Status: DC

## 2023-09-19 MED ORDER — LAMOTRIGINE ER 50 MG PO TB24: 50.0000 mg | ORAL_TABLET | Freq: Every day | ORAL | Status: DC

## 2023-09-19 NOTE — Consult Note (Signed)
 [facility] Psychiatric Consult Initial  Patient Name: .Rachel Woodard  MRN: [id number]  DOB: 1951/06/17  Consult Order details:  Orders (From admission, onward)     Start     Ordered   09/19/23 1610  CONSULT TO CALL ACT TEAM       Ordering Provider: Jacalyn Lefevre, MD  Provider:  (Not yet assigned)  Question:  Reason for Consult?  Answer:  Psych consult   09/19/23 1609             Mode of Visit: In person    Psychiatry Consult Evaluation  Service Date: September 19, 2023 LOS:  LOS: 0 days  Chief Complaint "crying and laughing"  Primary Psychiatric Diagnoses  Bipolar disorder 2.   Anxiety  3.   Depression   Assessment  Rachel Woodard is a 73 y.o. female admitted: Presented to the ED on 09/19/2023  2:17 PM for decompensating bipolar disorder. She carries the psychiatric diagnoses of bipolar disorder and has a past medical history of  hypertension, myopia, OSA.   Her current presentation of unconsolable crying, laughing, hallucinations is most consistent with decompensating bipolar disorder. She meets criteria for inpatient psychiatric admission based on current symptoms. Current outpatient psychotropic medications include Lamictal, Wellbutrin, Lexapro, and Seroquel and historically she has had a positive response to these medications. She was 9 compliant with medications prior to admission as evidenced by patient report. On initial examination, patient pleasant and cooperative. Please see plan below for detailed recommendations.   Diagnoses:  Active Hospital problems: Principal Problem:   Bipolar 1 disorder, mixed, moderate (HCC) Active Problems:   Anxiety and depression    Plan   ## Psychiatric Medication Recommendations:  Continue patient's home medications  ## Medical Decision Making Capacity: Not specifically addressed in this encounter  ## Further Work-up:  -- COVID test ordered, UA and UDS ordered EKG, U/A, or UDS -- most recent EKG on 09/19/23  had QtC of 433 -- Pertinent labwork reviewed earlier this admission includes: EKG, UDS, CBC, CMP, UA   ## Disposition:-- We recommend inpatient psychiatric hospitalization. Patient is under voluntary admission status at this time; please IVC if attempts to leave hospital.  ## Behavioral / Environmental: -To minimize splitting of staff, assign one staff person to communicate all information from the team when feasible. or Utilize compassion and acknowledge the patient's experiences while setting clear and realistic expectations for care.    ## Safety and Observation Level:  - Based on my clinical evaluation, I estimate the patient to be at no risk of self harm in the current setting. - At this time, we recommend  routine. This decision is based on my review of the chart including patient's history and current presentation, interview of the patient, mental status examination, and consideration of suicide risk including evaluating suicidal ideation, plan, intent, suicidal or self-harm behaviors, risk factors, and protective factors. This judgment is based on our ability to directly address suicide risk, implement suicide prevention strategies, and develop a safety plan while the patient is in the clinical setting. Please contact our team if there is a concern that risk level has changed.  CSSR Risk Category:C-SSRS RISK CATEGORY: No Risk  Suicide Risk Assessment: Patient has following modifiable risk factors for suicide: under treated depression , social isolation, and medication noncompliance, which we are addressing by recommending inpatient psychiatric admission. Patient has following non-modifiable or demographic risk factors for suicide: psychiatric hospitalization Patient has the following protective factors against suicide: Supportive friends and Pets in  the home  Thank you for this consult request. Recommendations have been communicated to the primary team.  We will recommend inpatient  psychiatric admission at this time.   Alona Bene, PMHNP       History of Present Illness  Relevant Aspects of Hospital ED Course:  Admitted on 09/19/2023 for mood disorder.  Patient Report:  Rachel Woodard, 73 y.o., female patient seen face to face by this provider, consulted with Dr. Woodroe Mode; and chart reviewed on 09/19/23.  On evaluation Rachel Woodard the patient states that she does not know why she is here in the emergency department.  Patient appeared to be guarded, stating that she did not want to talk, this provider informed her in order to make a sound decision on her plan of care that we had to talk about why she was here in the emergency department.  Patient then denied SI/HI, but states she intermittently sees shadows moving, and hears mumbling unable to make out what they are saying.  She also states at times she does get visits from her brother who passed away 4 years ago, she states that she feels he is checking up on her.  She also states that she gets visits from her past friends who was in a detox center with her who passed away intermittently.  She states that she has not been feeling like herself lately, feels that her "mood is off."  She also states that she knows her body, and she feels that "her body is off." Patient unable to elaborate.  She states that she woke up dizzy this morning, attempted to prepare her something to eat, states that she could not get the package open to open and hot dogs, then states that she became tearful and upset.  She then begins talking about how her friends come to her when they are in need of help, but then states that when she needs help she has no one to turn to.  Patient also states that she has a 33 year old boyfriend who lives in [place], she states that because it is a long distance relationship she has not seen her boyfriend and now they are talking about moving in together at the end of the year.  Patient  denies giving this boyfriend any money, states that he is a "good man for her, and she loves him."She states she currently lives with a roommate, which she enjoys, states they get along well, also states that she lives with her dog [name] who is a [place] terrier, states she has had her daughter for a long time and he is like her child.  She states that she does have 3 children, and she only speaks someone daughter currently.  She states she has been admitted to an inpatient psychiatric facility years ago, states her diagnosis is bipolar, anxiety, and PTSD.  States she currently takes her medications which consist of Lamictal, Wellbutrin, Lexapro, and Seroquel.  She states she is compliant with medications when she can remember, she is currently unsure when she took her last medications.  Patient denies any usage of illicit drugs or alcohol, she states she drinks alcohol occasionally, states she has not used crack/cocaine in 3 years.  BAL less than 10, UDS is needed.  During evaluation Rachel Woodard is sitting up on the side of the bed, eating dinner and appears to be in no acute distress. She is alert, oriented x 4, calm, cooperative and attentive. Her mood is sad  with congruent/flat affect. She has normal speech, and behavior.  Objectively there is no evidence of psychosis/mania or delusional thinking.  Patient is able to converse coherently, goal directed thoughts, no distractibility, or pre-occupation.    Per chart review - "patient born in [place]. [place]. Her parents were separated at very early age. She was raised by her father and stepmother. She has 3 children from 3 different relationship. She have her older son and daughter let adopted because she could not raised her. She had raised her third daughter who lives in [place]. Her father live in [place]. Patient moved from [place] few years ago to live close to her daughter. She also had a history of  substance use and her last use was 2 months ago.  She was in the rehabilitation for 38 days at day [facility] in June 2018. Patient gets easily irritable, severe mood swing, anger issues, poor sleep and socially isolated and withdrawn. "   Psych ROS:  Depression: Endorses Anxiety:  Endorses Mania (lifetime and current): Denies Psychosis: (lifetime and current): Denies  Collateral information:  Contacted none, waiting for patient to get roommate phone number out her phone   ROS   Psychiatric and Social History  Psychiatric History:  Information collected from patient and chart review   Prev Dx/Sx: Bipolar, anxiety, depression Current Psych Provider: None Home Meds (current): see above  Previous Med Trials: Yes Effexor  Therapy: None, had in past   Prior Psych Hospitalization: Yes  Prior Self Harm: Denies  Prior Violence: Denies   Family Psych History: Yes Family Hx suicide: Denies   Social History:  Developmental Hx: deferred  Educational Hx: Patient graduated high school Occupational Hx: Unemployed  Armed forces operational officer Hx: Denies  Living Situation: Lives with roommate  Spiritual Hx: Yes Access to weapons/lethal means: Denies    Substance History Alcohol: Yes  Type of alcohol wine, liquor  Last Drink unknown, occasionally Number of drinks per day none recently  History of alcohol withdrawal seizures denies History of DT's denies Tobacco: Yes Illicit drugs: Denies, no crack/cocaine in 3 years Prescription drug abuse: Denies Rehab hx: Yes  Exam Findings  Physical Exam:  Vital Signs:  Temp:  [98 F (36.7 C)] 98 F (36.7 C) (03/27 1426) Pulse Rate:  [79] 79 (03/27 1426) Resp:  [20] 20 (03/27 1426) BP: (126)/(87) 126/87 (03/27 1426) SpO2:  [100 %] 100 % (03/27 1426) Weight:  [105.6 kg] 105.6 kg (03/27 1426) Blood pressure 126/87, pulse 79, temperature 98 F (36.7 C), temperature source Oral, resp. rate 20, height 5\' 6"  (1.676 m), weight 105.6 kg, SpO2 100%. Body  mass index is 37.58 kg/m.  Physical Exam  Mental Status Exam: General Appearance: Casual  Orientation:  Full (Time, Place, and Person)  Memory:  Immediate;   Fair Remote;   Fair  Concentration:  Concentration: Fair and Attention Span: Fair  Recall:  Fair  Attention  Fair  Eye Contact:  Fair  Speech:  Clear and Coherent  Language:  Fair  Volume:  Normal  Mood: sad  Affect:  Depressed and Flat  Thought Process:  Coherent  Thought Content:  WDL  Suicidal Thoughts:  No  Homicidal Thoughts:  No  Judgement:  Intact  Insight:  Fair  Psychomotor Activity:  Normal  Akathisia:  No  Fund of Knowledge:  Fair      Assets:  Manufacturing systems engineer Desire for Improvement Social Support  Cognition:  WNL  ADL's:  Intact  AIMS (if indicated):        Other History   These have been pulled in through the EMR, reviewed, and updated if appropriate.  Family History:  The patient's family history includes Cancer in an other family member; Diabetes in an other family member; Leukemia in her mother; Liver disease in her brother; Prostate cancer in her father.  Medical History: Past Medical History:  Diagnosis Date   Anemia 2001   after gastric bypass   Anxiety    Aortic atherosclerosis (HCC)    Arthritis    Back pain    Bilateral swelling of feet    Bipolar 1 disorder (HCC)    Breast cancer (HCC) 2013   Breast cancer    Radiation therapy   Calcified granuloma of lung    Cigarette nicotine dependence    Cigarette nicotine dependence in remission    Cocaine dependence in remission (HCC)    COPD (chronic obstructive pulmonary disease) (HCC)    Depression    Disequilibrium    Fibromyalgia    GERD (gastroesophageal reflux disease)    HSV infection    Hypertension    Hypertensive kidney disease with stage 3a chronic kidney disease (HCC)    Hypertensive kidney disease with stage 3b chronic kidney disease (HCC)    Osteoarthritis    Personal history of infectious disease    Personal  history of radiation therapy 2013   Pneumonia    Pre-diabetes    PTSD (post-traumatic stress disorder)    Seasonal allergic rhinitis due to pollen    Sleep apnea    uses CPAP   SOB (shortness of breath)    Vitamin D deficiency     Surgical History: Past Surgical History:  Procedure Laterality Date   ABDOMINAL HYSTERECTOMY  2018   BREAST LUMPECTOMY Right 2013   GASTRIC BYPASS  2001   TOTAL KNEE ARTHROPLASTY Left 12/22/2018   Procedure: LEFT TOTAL KNEE ARTHROPLASTY;  Surgeon: Tarry Kos, MD;  Location: [facility] OR;  Service: Orthopedics;  Laterality: Left;     Medications:   Current Facility-Administered Medications:    [START ON 09/20/2023] buPROPion (WELLBUTRIN XL) 24 hr tablet 150 mg, 150 mg, Oral, q morning, Jacalyn Lefevre, MD   Melene Muller ON 09/20/2023] calcium carbonate (OS-CAL - dosed in mg of elemental calcium) tablet 1,250 mg, 1 tablet, Oral, BID WC, Ellington, Abby K, RPH   carvedilol (COREG) tablet 3.125 mg, 3.125 mg, Oral, BID WC, Jacalyn Lefevre, MD, 3.125 mg at 09/19/23 1722   cholecalciferol (VITAMIN D3) 25 MCG (1000 UNIT) tablet 1,000 Units, 1,000 Units, Oral, Daily, Jacalyn Lefevre, MD, 1,000 Units at 09/19/23 1722   escitalopram (LEXAPRO) tablet 10 mg, 10 mg, Oral, QHS, Jacalyn Lefevre, MD   hydrochlorothiazide (HYDRODIURIL) tablet 25 mg, 25 mg, Oral, Daily, Jacalyn Lefevre, MD, 25 mg at 09/19/23 1722   lamoTRIgine (LAMICTAL) tablet 200 mg, 200 mg, Oral, Daily, Jacalyn Lefevre, MD   multivitamin with minerals tablet 1 tablet, 1 tablet, Oral, Daily, Jacalyn Lefevre, MD, 1 tablet at 09/19/23 1722   nicotine (NICODERM CQ - dosed in mg/24 hours) patch 21 mg, 21 mg, Transdermal, Daily, Jacalyn Lefevre, MD, 21 mg at 09/19/23 1722   [START ON 09/20/2023] pantoprazole (PROTONIX) EC tablet 40 mg, 40 mg, Oral, q morning, Jacalyn Lefevre, MD   QUEtiapine (SEROQUEL) tablet 200 mg, 200 mg, Oral, QHS, Jacalyn Lefevre, MD   sucralfate (CARAFATE) tablet 1 g, 1 g, Oral, TID WC & HS,  Jacalyn Lefevre, MD, 1 g at 09/19/23 1810  Current  Outpatient Medications:    buPROPion (WELLBUTRIN XL) 300 MG 24 hr tablet, Take 300 mg by mouth daily., Disp: , Rfl:    calcium carbonate (OS-CAL) 600 MG TABS tablet, Take 600 mg by mouth 2 (two) times daily with a meal., Disp: , Rfl:    carvedilol (COREG) 3.125 MG tablet, TAKE 1 TABLET (3.125 MG TOTAL) BY MOUTH 2 (TWO) TIMES DAILY WITH A MEAL., Disp: 180 tablet, Rfl: 0   cholecalciferol (VITAMIN D3) 25 MCG (1000 UNIT) tablet, Take 1,000 Units by mouth daily., Disp: , Rfl:    escitalopram (LEXAPRO) 20 MG tablet, Take 20 mg by mouth daily., Disp: , Rfl:    hydrochlorothiazide (HYDRODIURIL) 25 MG tablet, TAKE 1 TABLET (25 MG TOTAL) BY MOUTH DAILY., Disp: 90 tablet, Rfl: 0   lamoTRIgine (LAMICTAL) 100 MG tablet, Take 100 mg by mouth daily., Disp: , Rfl:    lamoTRIgine (LAMICTAL) 200 MG tablet, Take 200 mg by mouth daily., Disp: , Rfl:    lamoTRIgine (LAMICTAL) 25 MG tablet, Take 25 mg by mouth daily., Disp: , Rfl:    lamoTRIgine 50 MG TBDP, Take 1 tablet by mouth daily., Disp: , Rfl:    Multiple Vitamin (MULTIVITAMIN WITH MINERALS) TABS tablet, Take 1 tablet by mouth daily., Disp: , Rfl:    pantoprazole (PROTONIX) 40 MG tablet, Take 40 mg by mouth every morning., Disp: , Rfl:    QUEtiapine (SEROQUEL) 200 MG tablet, Take 200 mg by mouth at bedtime., Disp: , Rfl:    sucralfate (CARAFATE) 1 g tablet, 1 tablet on an empty stomach, Disp: , Rfl:    valACYclovir (VALTREX) 500 MG tablet, Take 500 mg by mouth 2 (two) times daily., Disp: , Rfl:    vitamin C (ASCORBIC ACID) 500 MG tablet, Take 500 mg by mouth daily., Disp: , Rfl:    Vitamin D, Ergocalciferol, (DRISDOL) 1.25 MG (50000 UNIT) CAPS capsule, Take 1 capsule (50,000 Units total) by mouth every 7 (seven) days., Disp: 12 capsule, Rfl: 0   buPROPion (WELLBUTRIN XL) 150 MG 24 hr tablet, Take 150 mg by mouth every morning. (Patient not taking: Reported on 09/19/2023), Disp: , Rfl:    escitalopram (LEXAPRO)  10 MG tablet, Take 10 mg by mouth at bedtime. (Patient not taking: Reported on 09/19/2023), Disp: , Rfl:   Allergies: Allergies  Allergen Reactions   Klonopin [Clonazepam] Anaphylaxis   Lisinopril Anaphylaxis   Abilify [Aripiprazole] Other (See Comments)    Insomnia, headaches, freq urination, anxiety    Cariprazine Swelling   Metformin And Related Diarrhea    Artemisia Auvil MOTLEY-MANGRUM, PMHNP

## 2023-09-19 NOTE — ED Notes (Signed)
Report given to Holly

## 2023-09-19 NOTE — ED Provider Notes (Signed)
 Pt signed out by Dr. Rubin Payor pending labs.  UA neg, UDS neg, CMP neg other than K sl low at 3.3 and cr sl elevated at 1.43 (cr 1.18 in dec); etoh neg  Pt is medically clear for psych eval.    Home meds and diet ordered.   Psych did consult and recommends pt for inpatient psych.   Jacalyn Lefevre, MD 09/19/23 2106

## 2023-09-19 NOTE — ED Notes (Signed)
 Consent for Voluntary admisson e-signed into chart . Report called to M. Konrad Dolores RN ARM-C gero unit pt has ready bed. Safe transport called and pt is ready and waiting transport.

## 2023-09-19 NOTE — Progress Notes (Signed)
 [facility] LCSW Progress Note   09/19/2023    7:58 PM  Rachel Woodard   [id number]   Type of Contact and Topic:  Psychiatric Bed Placement   Pt accepted to [facility]    Patient meets inpatient criteria per Alona Bene, [facility]  The attending provider will be Dr. Sandria Senter  Call report to [phone]  Alfonzo Feller, RN @ [facility] notified.     Pt scheduled  to arrive at [facility].    Damita Dunnings, MSW, LCSW-A  8:00 PM 09/19/2023

## 2023-09-19 NOTE — ED Provider Notes (Signed)
 [place] EMERGENCY DEPARTMENT AT [facility] Provider Note   CSN: [id number] Arrival date & time: 09/19/23  1359     History  Chief Complaint  Patient presents with   Anxiety    Rachel Woodard is a 73 y.o. female.   Anxiety  Patient reportedly brought in for crying.  Patient really cannot provide a whole lot history.  Has been crying and laughing.  States that her roommate called the ambulance.  Does have history of bipolar disease.  Denies hurting anywhere.     Home Medications Prior to Admission medications   Medication Sig Start Date End Date Taking? Authorizing Provider  buPROPion (WELLBUTRIN XL) 150 MG 24 hr tablet Take 150 mg by mouth every morning. 09/14/22   [provider]  calcium carbonate (OS-CAL) 600 MG TABS tablet Take 600 mg by mouth 2 (two) times daily with a meal.    [provider]  carvedilol (COREG) 3.125 MG tablet TAKE 1 TABLET (3.125 MG TOTAL) BY MOUTH 2 (TWO) TIMES DAILY WITH A MEAL. 12/18/18   Marcine Matar, MD  cholecalciferol (VITAMIN D3) 25 MCG (1000 UNIT) tablet Take 1,000 Units by mouth daily.    [provider]  escitalopram (LEXAPRO) 10 MG tablet Take 10 mg by mouth at bedtime.    [provider]  hydrochlorothiazide (HYDRODIURIL) 25 MG tablet TAKE 1 TABLET (25 MG TOTAL) BY MOUTH DAILY. 02/24/19   Marcine Matar, MD  lamoTRIgine (LAMICTAL) 200 MG tablet Take 200 mg by mouth daily. 11/18/19   [provider]  lamoTRIgine (LAMICTAL) 25 MG tablet Take 25 mg by mouth daily. 11/29/21   [provider]  Multiple Vitamin (MULTIVITAMIN WITH MINERALS) TABS tablet Take 1 tablet by mouth daily.    [provider]  pantoprazole (PROTONIX) 40 MG tablet Take 40 mg by mouth every morning. 08/12/18   [provider]  QUEtiapine (SEROQUEL) 200 MG tablet Take 200 mg by mouth at bedtime. 01/15/20   [provider]  sucralfate (CARAFATE) 1 g tablet 1 tablet on an  empty stomach 10/24/20   [provider]  valACYclovir (VALTREX) 500 MG tablet Take 500 mg by mouth 2 (two) times daily.    [provider]  vitamin C (ASCORBIC ACID) 500 MG tablet Take 500 mg by mouth daily.    [provider]  Vitamin D, Ergocalciferol, (DRISDOL) 1.25 MG (50000 UNIT) CAPS capsule Take 1 capsule (50,000 Units total) by mouth every 7 (seven) days. 08/29/23   Irene Limbo, FNP      Allergies    Klonopin [clonazepam], Lisinopril, Abilify [aripiprazole], Cariprazine, and Metformin and related    Review of Systems   Review of Systems  Physical Exam Updated Vital Signs BP 126/87   Pulse 79   Temp 98 F (36.7 C) (Oral)   Resp 20   Ht 5\' 6"  (1.676 m)   Wt 105.6 kg   SpO2 100%   BMI 37.58 kg/m  Physical Exam Vitals and nursing note reviewed.  HENT:     Head: Atraumatic.  Cardiovascular:     Rate and Rhythm: Regular rhythm.  Pulmonary:     Breath sounds: No wheezing.  Abdominal:     Tenderness: There is no abdominal tenderness.  Neurological:     Mental Status: She is alert.  Psychiatric:     Comments: Patient is both tearful and laughing.  Will follow commands.     ED Results / Procedures / Treatments   Labs (all labs  ordered are listed, but only abnormal results are displayed) Labs Reviewed  CBG MONITORING, ED - Abnormal; Notable for the following components:      Result Value   Glucose-Capillary 199 (*)    All other components within normal limits  URINALYSIS, W/ REFLEX TO CULTURE (INFECTION SUSPECTED)  RAPID URINE DRUG SCREEN, HOSP PERFORMED  COMPREHENSIVE METABOLIC PANEL WITH GFR  ETHANOL  CBC    EKG None  Radiology No results found.  Procedures Procedures    Medications Ordered in ED Medications - No data to display  ED Course/ Medical Decision Making/ A&P                                 Medical Decision Making Amount and/or Complexity of Data Reviewed Labs: ordered.   Patient reportedly  brought in for crying.  Has history of bipolar disorder.  I think he likely will be medically cleared.  Will get basic blood work.  If reassuring will need psychiatric evaluation.  Carefully turned over to oncoming provider.        Final Clinical Impression(s) / ED Diagnoses Final diagnoses:  None    Rx / DC Orders ED Discharge Orders     None         Benjiman Core, MD 09/19/23 1442

## 2023-09-19 NOTE — ED Triage Notes (Signed)
 BIB EMS from apartment, pt roommate came home and pt was inconsolably crying. Pt will not express what is wrong, continues talking about her dog Issac. Will not answer any questions. Crying and is inconsolable.

## 2023-09-19 NOTE — ED Notes (Signed)
Pt's belongings placed in [address].   

## 2023-09-19 NOTE — ED Notes (Signed)
 Patient comes in crying unable to console while triaging patient ask patient if she has done any drugs patient replies "no, I haven't done drugs in 3 years" then starts laughing. Patient talks about seeing spirits and has a boo that lives in [place], [place]. Patient starts singing different songs that are not familiar to this nurse.

## 2023-09-20 ENCOUNTER — Encounter: Payer: Self-pay | Admitting: Psychiatry

## 2023-09-20 ENCOUNTER — Inpatient Hospital Stay
Admission: AD | Admit: 2023-09-20 | Discharge: 2023-09-24 | DRG: 885 | Disposition: A | Discharge status: Home / Self Care | Payer: Self-pay | Source: Intra-hospital | Attending: Psychiatry | Admitting: Psychiatry

## 2023-09-20 DIAGNOSIS — Z833 Family history of diabetes mellitus: Secondary | ICD-10-CM

## 2023-09-20 DIAGNOSIS — Z853 Personal history of malignant neoplasm of breast: Secondary | ICD-10-CM

## 2023-09-20 DIAGNOSIS — F319 Bipolar disorder, unspecified: Secondary | ICD-10-CM | POA: Diagnosis not present

## 2023-09-20 DIAGNOSIS — Z923 Personal history of irradiation: Secondary | ICD-10-CM

## 2023-09-20 DIAGNOSIS — M797 Fibromyalgia: Secondary | ICD-10-CM | POA: Diagnosis not present

## 2023-09-20 DIAGNOSIS — Z90710 Acquired absence of both cervix and uterus: Secondary | ICD-10-CM | POA: Diagnosis not present

## 2023-09-20 DIAGNOSIS — F17210 Nicotine dependence, cigarettes, uncomplicated: Secondary | ICD-10-CM | POA: Diagnosis present

## 2023-09-20 DIAGNOSIS — F1421 Cocaine dependence, in remission: Secondary | ICD-10-CM | POA: Diagnosis not present

## 2023-09-20 DIAGNOSIS — F4310 Post-traumatic stress disorder, unspecified: Secondary | ICD-10-CM | POA: Diagnosis present

## 2023-09-20 DIAGNOSIS — Z62810 Personal history of physical and sexual abuse in childhood: Secondary | ICD-10-CM | POA: Diagnosis not present

## 2023-09-20 DIAGNOSIS — J449 Chronic obstructive pulmonary disease, unspecified: Secondary | ICD-10-CM | POA: Diagnosis present

## 2023-09-20 DIAGNOSIS — N1832 Chronic kidney disease, stage 3b: Secondary | ICD-10-CM | POA: Diagnosis not present

## 2023-09-20 DIAGNOSIS — Z96652 Presence of left artificial knee joint: Secondary | ICD-10-CM | POA: Diagnosis present

## 2023-09-20 DIAGNOSIS — Z8042 Family history of malignant neoplasm of prostate: Secondary | ICD-10-CM | POA: Diagnosis not present

## 2023-09-20 DIAGNOSIS — Z9884 Bariatric surgery status: Secondary | ICD-10-CM

## 2023-09-20 DIAGNOSIS — F419 Anxiety disorder, unspecified: Secondary | ICD-10-CM | POA: Diagnosis present

## 2023-09-20 DIAGNOSIS — Z79899 Other long term (current) drug therapy: Secondary | ICD-10-CM

## 2023-09-20 DIAGNOSIS — F3131 Bipolar disorder, current episode depressed, mild: Secondary | ICD-10-CM | POA: Diagnosis not present

## 2023-09-20 DIAGNOSIS — Z806 Family history of leukemia: Secondary | ICD-10-CM | POA: Diagnosis not present

## 2023-09-20 DIAGNOSIS — Z62811 Personal history of psychological abuse in childhood: Secondary | ICD-10-CM

## 2023-09-20 DIAGNOSIS — I129 Hypertensive chronic kidney disease with stage 1 through stage 4 chronic kidney disease, or unspecified chronic kidney disease: Secondary | ICD-10-CM | POA: Diagnosis present

## 2023-09-20 MED ORDER — ESCITALOPRAM OXALATE 10 MG PO TABS: 20.0000 mg | ORAL_TABLET | Freq: Every day | ORAL | Status: DC

## 2023-09-20 MED ORDER — MAGNESIUM HYDROXIDE 400 MG/5ML PO SUSP: 15.0000 mL | Freq: Every day | ORAL | Status: DC | PRN

## 2023-09-20 MED ORDER — OLANZAPINE 5 MG PO TBDP: 5.0000 mg | ORAL_TABLET | Freq: Three times a day (TID) | ORAL | Status: DC | PRN

## 2023-09-20 MED ORDER — LAMOTRIGINE 100 MG PO TABS: 100.0000 mg | ORAL_TABLET | Freq: Every day | ORAL | Status: DC

## 2023-09-20 MED ORDER — NICOTINE 21 MG/24HR TD PT24
21.0000 mg | MEDICATED_PATCH | Freq: Every day | TRANSDERMAL | Status: DC
  Administered 2023-09-20 – 2023-09-24 (×5): 21 mg via TRANSDERMAL
  Filled 2023-09-20 (×5): qty 1

## 2023-09-20 MED ORDER — LAMOTRIGINE 25 MG PO TABS
25.0000 mg | ORAL_TABLET | Freq: Two times a day (BID) | ORAL | Status: DC
  Administered 2023-09-20 – 2023-09-24 (×9): 25 mg via ORAL
  Filled 2023-09-20 (×9): qty 1

## 2023-09-20 MED ORDER — HYDROCHLOROTHIAZIDE 25 MG PO TABS
25.0000 mg | ORAL_TABLET | Freq: Every day | ORAL | Status: DC
  Administered 2023-09-20 – 2023-09-24 (×5): 25 mg via ORAL
  Filled 2023-09-20 (×5): qty 1

## 2023-09-20 MED ORDER — ALUM & MAG HYDROXIDE-SIMETH 200-200-20 MG/5ML PO SUSP: 15.0000 mL | Freq: Four times a day (QID) | ORAL | Status: DC | PRN

## 2023-09-20 MED ORDER — HYDROXYZINE HCL 25 MG PO TABS
25.0000 mg | ORAL_TABLET | Freq: Three times a day (TID) | ORAL | Status: DC | PRN
  Administered 2023-09-22 – 2023-09-23 (×2): 25 mg via ORAL
  Filled 2023-09-20 (×4): qty 1

## 2023-09-20 MED ORDER — QUETIAPINE FUMARATE 200 MG PO TABS
200.0000 mg | ORAL_TABLET | Freq: Every day | ORAL | Status: DC
  Administered 2023-09-20 – 2023-09-23 (×4): 200 mg via ORAL
  Filled 2023-09-20 (×4): qty 1

## 2023-09-20 MED ORDER — BUPROPION HCL ER (XL) 300 MG PO TB24
300.0000 mg | ORAL_TABLET | Freq: Every day | ORAL | Status: DC
  Administered 2023-09-20 – 2023-09-24 (×5): 300 mg via ORAL
  Filled 2023-09-20 (×5): qty 1

## 2023-09-20 MED ORDER — ACETAMINOPHEN 325 MG PO TABS
650.0000 mg | ORAL_TABLET | Freq: Four times a day (QID) | ORAL | Status: DC | PRN
  Administered 2023-09-20 – 2023-09-23 (×4): 650 mg via ORAL
  Filled 2023-09-20 (×4): qty 2

## 2023-09-20 MED ORDER — CARVEDILOL 6.25 MG PO TABS
3.1250 mg | ORAL_TABLET | Freq: Two times a day (BID) | ORAL | Status: DC
  Administered 2023-09-21 – 2023-09-24 (×5): 3.125 mg via ORAL
  Filled 2023-09-20 (×6): qty 1

## 2023-09-20 NOTE — Progress Notes (Signed)
 Patient arrived on the unit via wheelchair with her belongings which included a cane @ 12:50a.  She reported that the cane is her roommates, and he felt like she needed it.  She said that she ambulates independently without difficulty.  She denied a history of falls.  Hx. of Left total knee replacement "5 years ago".  She denied pain/discomfort.  This pleasant, friendly 73 yr old is alert & oriented x 4.  She denied SI, HI, Hallucinations, and Delusions.  This writer was able to get an extensive history from the patient.  She reported that she her crying spell "came all of a sudden" after she came back from walking her dog.  She got frustrated after unsuccessfully opening a pack of hotdogs.      The patient seems to be a very informative historian until she reported that she flies to [place], [place], monthly to see her "boyfriend Dario Guardian".  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  She reported that he is in the studio recording so she flew back "last month" from [place].  This Clinical research associate is familiar with this artist, but uncertain if this information is accurate.  She has given use permission to speak with her Brother, Caryn Bee, and her roommate, Greig Castilla.  She has 3 children and is estranged from her son and youngest daughter.  Her youngest daughter lives in [place], [place], and has a birthday today.  Her other daughter lives in [place] & her son lives in [place].  Orders signed and released.  Plan of Care initiated as it is written.  Safety checks initiated and being completed Q1m as ordered.  She is resting in bed at this time with no s/sx of acute distress.  Resp. Even/unlabored.

## 2023-09-20 NOTE — BHH Counselor (Signed)
 CSW received verbal permission from pt to contact her roommate Greig Castilla for collateral [phone]  Reynaldo Minium, MSW, [facility] 09/20/2023 5:41 PM

## 2023-09-20 NOTE — BHH Suicide Risk Assessment (Signed)
 [facility] Admission Suicide Risk Assessment   Nursing information obtained from:  Patient Demographic factors:    Current Mental Status:    Loss Factors:    Historical Factors:    Risk Reduction Factors:     Total Time spent with patient: 30 minutes Principal Problem: Bipolar affective disorder (HCC) Diagnosis:  Principal Problem:   Bipolar affective disorder (HCC)  Subjective Data:  Rachel Woodard is a 73 y.o. female admitted: Presented to the ED on 09/19/2023  2:17 PM for decompensating bipolar disorder. She carries the psychiatric diagnoses of bipolar disorder and has a past medical history of  hypertension, myopia, OSA. Her current presentation of unconsolable crying, laughing, hallucinations is most consistent with decompensating bipolar disorder.  Continued Clinical Symptoms:    The "Alcohol Use Disorders Identification Test", Guidelines for Use in Primary Care, Second Edition.  World Science writer [facility]). Score between 0-7:  no or low risk or alcohol related problems. Score between 8-15:  moderate risk of alcohol related problems. Score between 16-19:  high risk of alcohol related problems. Score 20 or above:  warrants further diagnostic evaluation for alcohol dependence and treatment.   CLINICAL FACTORS:   Previous Psychiatric Diagnoses and Treatments   Musculoskeletal: Strength & Muscle Tone: within normal limits Gait & Station: normal Patient leans: N/A  Psychiatric Specialty Exam:  Presentation  General Appearance:  Appropriate for Environment; Casual  Eye Contact: Fair  Speech: Clear and Coherent  Speech Volume: Normal  Handedness: Right   Mood and Affect  Mood: Anxious  Affect: Appropriate   Thought Process  Thought Processes: Coherent  Descriptions of Associations:Intact  Orientation:Partial  Thought Content:Illogical  History of Schizophrenia/Schizoaffective disorder:No data recorded Duration of Psychotic Symptoms:No data  recorded Hallucinations:Hallucinations: None  Ideas of Reference:Delusions  Suicidal Thoughts:Suicidal Thoughts: No  Homicidal Thoughts:Homicidal Thoughts: No   Sensorium  Memory: Recent Fair; Immediate Fair; Remote Fair  Judgment: Impaired  Insight: Shallow   Executive Functions  Concentration: Fair  Attention Span: Fair  Recall: [employer] of Knowledge: Fair  Language: Fair   Psychomotor Activity  Psychomotor Activity: Psychomotor Activity: Normal   Assets  Assets: Communication Skills; Desire for Improvement; Physical Health   Sleep  Sleep: Sleep: Fair    Physical Exam: Physical Exam ROS Blood pressure 119/76, pulse 61, temperature 98 F (36.7 C), resp. rate 17, height 5\' 6"  (1.676 m), weight 100.7 kg, SpO2 98%. Body mass index is 35.83 kg/m.   COGNITIVE FEATURES THAT CONTRIBUTE TO RISK:  None    SUICIDE RISK:   Minimal: No identifiable suicidal ideation.  Patients presenting with no risk factors but with morbid ruminations; may be classified as minimal risk based on the severity of the depressive symptoms  PLAN OF CARE: Patient is admitted to [facility] psych unit with Q15 min safety monitoring. Multidisciplinary team approach is offered. Medication management; group/milieu therapy is offered.   I certify that inpatient services furnished can reasonably be expected to improve the patient's condition.   Verner Chol, MD 09/20/2023, 1:56 PM
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
## 2023-09-20 NOTE — Group Note (Addendum)
 Therapy Group Note  Group Topic:Other  Group Date: 09/20/2023 Start Time: 1300 End Time: 1400 Facilitators: Lottie Mussel, OT    Group Description:  Group discussed impact of chronic/acute pain on safety and independence with functional tasks and impact on mental health.  Identified and discussed any previously learned or implemented strategies used.  Discussed and reviewed cognitive behavioral pain coping strategies to address/improve overall management of pain. Discussed relaxation, distraction techniques, cognitive restructuring, activity pacing/energy conservation, environment/home safety modifications, and role of sleep and sleep hygiene. Allowed time for questions and further discussion.    Therapeutic Goal(s):   Identify and discuss previously utilized pain coping strategies and implications of pain on function/well-being Identify and discuss implementing new cognitive behavioral pain coping strategies into daily routines Demonstrate understanding and performance of learned cognitive behavioral pain coping strategies.    Individual Participation: Pt did not attend     Participation Level: Did not attend   Participation Quality:   Behavior:   Speech/Thought Process:   Affect/Mood:   Insight:   Judgement:   Individualization:   Modes of Intervention:   Patient Response to Interventions:    Plan: Continue to engage patient in OT groups 1-2x/week.  Arman Filter., MPH, MS, OTR/L ascom [phone] 09/20/23, 3:48 PM

## 2023-09-20 NOTE — Plan of Care (Signed)
 Patient admitted to unit. Discussed Care Plan including plans for discharge.

## 2023-09-20 NOTE — Plan of Care (Signed)
 D: Pt alert and oriented. Pt denies experiencing any anxiety/depression at this time. Pt reports experiencing a headache, prn medication given. Pt denies experiencing any SI/HI, or AVH at this time.   A: Scheduled medications administered to pt, per MD orders. Support and encouragement provided. Frequent verbal contact made. Routine safety checks conducted q15 minutes.   R: No adverse drug reactions noted. Pt verbally contracts for safety at this time. Pt compliant with medications and treatment plan. Pt interacts well but minimally with others on the unit. Pt remains safe at this time. Plan of care ongoing.  Evening dose of carvedilol held d/t low BP, MD notified and aware.   Problem: Education: Goal: Knowledge of [facility] Education information/materials will improve Outcome: Progressing Goal: Emotional status will improve Outcome: Progressing

## 2023-09-20 NOTE — BH IP Treatment Plan (Signed)
 Interdisciplinary Treatment and Diagnostic Plan Update  09/20/2023 Time of Session: 3:00 PM  [name] MRN: [id number]  Principal Diagnosis: Bipolar affective disorder [facility])  Secondary Diagnoses: Principal Problem:   Bipolar affective disorder (HCC)   Current Medications:  Current Facility-Administered Medications  Medication Dose Route Frequency Provider Last Rate Last Admin   acetaminophen (TYLENOL) tablet 650 mg  650 mg Oral Q6H PRN Onuoha, Chinwendu V, NP   650 mg at 09/20/23 0850   alum & mag hydroxide-simeth (MAALOX/MYLANTA) 200-200-20 MG/5ML suspension 15 mL  15 mL Oral Q6H PRN Onuoha, Chinwendu V, NP       buPROPion (WELLBUTRIN XL) 24 hr tablet 300 mg  300 mg Oral Daily Verner Chol, MD       carvedilol (COREG) tablet 3.125 mg  3.125 mg Oral BID WC Verner Chol, MD       escitalopram (LEXAPRO) tablet 20 mg  20 mg Oral Daily Verner Chol, MD       hydrochlorothiazide (HYDRODIURIL) tablet 25 mg  25 mg Oral Daily Verner Chol, MD       hydrOXYzine (ATARAX) tablet 25 mg  25 mg Oral TID PRN Onuoha, Chinwendu V, NP       lamoTRIgine (LAMICTAL) tablet 100 mg  100 mg Oral Daily Verner Chol, MD       magnesium hydroxide (MILK OF MAGNESIA) suspension 15 mL  15 mL Oral Daily PRN Onuoha, Chinwendu V, NP       nicotine (NICODERM CQ - dosed in mg/24 hours) patch 21 mg  21 mg Transdermal Daily Verner Chol, MD   21 mg at 09/20/23 1014   OLANZapine zydis (ZYPREXA) disintegrating tablet 5 mg  5 mg Oral TID PRN Onuoha, Chinwendu V, NP       QUEtiapine (SEROQUEL) tablet 200 mg  200 mg Oral QHS Verner Chol, MD       PTA Medications: Medications Prior to Admission  Medication Sig Dispense Refill Last Dose/Taking   buPROPion (WELLBUTRIN XL) 150 MG 24 hr tablet Take 150 mg by mouth every morning. (Patient not taking: Reported on 09/19/2023)      buPROPion (WELLBUTRIN XL) 300 MG 24 hr tablet Take 300 mg by mouth daily.      calcium carbonate (OS-CAL) 600 MG TABS  tablet Take 600 mg by mouth 2 (two) times daily with a meal.      carvedilol (COREG) 3.125 MG tablet TAKE 1 TABLET (3.125 MG TOTAL) BY MOUTH 2 (TWO) TIMES DAILY WITH A MEAL. 180 tablet 0    cholecalciferol (VITAMIN D3) 25 MCG (1000 UNIT) tablet Take 1,000 Units by mouth daily.      escitalopram (LEXAPRO) 10 MG tablet Take 10 mg by mouth at bedtime. (Patient not taking: Reported on 09/19/2023)      escitalopram (LEXAPRO) 20 MG tablet Take 20 mg by mouth daily.      hydrochlorothiazide (HYDRODIURIL) 25 MG tablet TAKE 1 TABLET (25 MG TOTAL) BY MOUTH DAILY. 90 tablet 0    lamoTRIgine (LAMICTAL) 100 MG tablet Take 100 mg by mouth daily.      lamoTRIgine (LAMICTAL) 200 MG tablet Take 200 mg by mouth daily.      lamoTRIgine (LAMICTAL) 25 MG tablet Take 25 mg by mouth daily.      lamoTRIgine 50 MG TBDP Take 1 tablet by mouth daily.      Multiple Vitamin (MULTIVITAMIN WITH MINERALS) TABS tablet Take 1 tablet by mouth daily.      pantoprazole (PROTONIX) 40 MG tablet Take 40 mg by  mouth every morning.      QUEtiapine (SEROQUEL) 200 MG tablet Take 200 mg by mouth at bedtime.      sucralfate (CARAFATE) 1 g tablet 1 tablet on an empty stomach      valACYclovir (VALTREX) 500 MG tablet Take 500 mg by mouth 2 (two) times daily.      vitamin C (ASCORBIC ACID) 500 MG tablet Take 500 mg by mouth daily.      Vitamin D, Ergocalciferol, (DRISDOL) 1.25 MG (50000 UNIT) CAPS capsule Take 1 capsule (50,000 Units total) by mouth every 7 (seven) days. 12 capsule 0     Patient Stressors:    Patient Strengths:    Treatment Modalities: Medication Management, Group therapy, Case management,  1 to 1 session with clinician, Psychoeducation, Recreational therapy.   Physician Treatment Plan for Primary Diagnosis: Bipolar affective disorder (HCC) Long Term Goal(s): Improvement in symptoms so as ready for discharge   Short Term Goals: Ability to identify changes in lifestyle to reduce recurrence of condition will  improve Ability to verbalize feelings will improve Ability to disclose and discuss suicidal ideas Ability to demonstrate self-control will improve Ability to identify and develop effective coping behaviors will improve Ability to maintain clinical measurements within normal limits will improve Compliance with prescribed medications will improve Ability to identify triggers associated with substance abuse/mental health issues will improve  Medication Management: Evaluate patient's response, side effects, and tolerance of medication regimen.  Therapeutic Interventions: 1 to 1 sessions, Unit Group sessions and Medication administration.  Evaluation of Outcomes: Progressing  Physician Treatment Plan for Secondary Diagnosis: Principal Problem:   Bipolar affective disorder (HCC)  Long Term Goal(s): Improvement in symptoms so as ready for discharge   Short Term Goals: Ability to identify changes in lifestyle to reduce recurrence of condition will improve Ability to verbalize feelings will improve Ability to disclose and discuss suicidal ideas Ability to demonstrate self-control will improve Ability to identify and develop effective coping behaviors will improve Ability to maintain clinical measurements within normal limits will improve Compliance with prescribed medications will improve Ability to identify triggers associated with substance abuse/mental health issues will improve     Medication Management: Evaluate patient's response, side effects, and tolerance of medication regimen.  Therapeutic Interventions: 1 to 1 sessions, Unit Group sessions and Medication administration.  Evaluation of Outcomes: Progressing   RN Treatment Plan for Primary Diagnosis: Bipolar affective disorder (HCC) Long Term Goal(s): Knowledge of disease and therapeutic regimen to maintain health will improve  Short Term Goals: Ability to remain free from injury will improve, Ability to verbalize frustration  and anger appropriately will improve, Ability to demonstrate self-control, Ability to participate in decision making will improve, Ability to verbalize feelings will improve, Ability to disclose and discuss suicidal ideas, Ability to identify and develop effective coping behaviors will improve, and Compliance with prescribed medications will improve  Medication Management: RN will administer medications as ordered by provider, will assess and evaluate patient's response and provide education to patient for prescribed medication. RN will report any adverse and/or side effects to prescribing provider.  Therapeutic Interventions: 1 on 1 counseling sessions, Psychoeducation, Medication administration, Evaluate responses to treatment, Monitor vital signs and CBGs as ordered, Perform/monitor CIWA, COWS, AIMS and Fall Risk screenings as ordered, Perform wound care treatments as ordered.  Evaluation of Outcomes: Progressing   LCSW Treatment Plan for Primary Diagnosis: Bipolar affective disorder (HCC) Long Term Goal(s): Safe transition to appropriate next level of care at discharge, Engage patient in therapeutic group  addressing interpersonal concerns.  Short Term Goals: Engage patient in aftercare planning with referrals and resources, Increase social support, Increase ability to appropriately verbalize feelings, Increase emotional regulation, Facilitate acceptance of mental health diagnosis and concerns, Facilitate patient progression through stages of change regarding substance use diagnoses and concerns, Identify triggers associated with mental health/substance abuse issues, and Increase skills for wellness and recovery  Therapeutic Interventions: Assess for all discharge needs, 1 to 1 time with Social worker, Explore available resources and support systems, Assess for adequacy in community support network, Educate family and significant other(s) on suicide prevention, Complete Psychosocial Assessment,  Interpersonal group therapy.  Evaluation of Outcomes: Progressing   Progress in Treatment: Attending groups: No. Participating in groups: No. Taking medication as prescribed: Yes. Toleration medication: Yes. Family/Significant other contact made: No, will contact:  CSW will contact if given permission  Patient understands diagnosis: Yes. Discussing patient identified problems/goals with staff: Yes. Medical problems stabilized or resolved: Yes. Denies suicidal/homicidal ideation: Yes. Issues/concerns per patient self-inventory: No. Other: None   New problem(s) identified: No, Describe:  None identified   New Short Term/Long Term Goal(s): elimination of symptoms of psychosis, medication management for mood stabilization; elimination of SI thoughts; development of comprehensive mental wellness plan.   Patient Goals:  " I don't really have any goals that I want to set, I'm happy as it Rachel"   Discharge Plan or Barriers: CSW will assist with appropriate discharge planning   Reason for Continuation of Hospitalization: Medication stabilization  Estimated Length of Stay: 1 to 7 days   Last 3 [place] Suicide Severity Risk Score: Flowsheet Row Admission (Current) from 09/20/2023 in [facility] ED from 09/19/2023 in [facility] Emergency Department [facility]  C-SSRS RISK CATEGORY No Risk No Risk       Last [facility] 2/9 Scores:    03/14/2021    7:17 AM 02/20/2021    9:39 AM 12/29/2018    5:22 PM  Depression screen PHQ 2/9  Decreased Interest 1 0 0  Down, Depressed, Hopeless 0 0 0  PHQ - 2 Score 1 0 0  Altered sleeping 1 0   Tired, decreased energy 2 0   Change in appetite 1 0   Feeling bad or failure about yourself  0 0   Trouble concentrating 0 0   Moving slowly or fidgety/restless 0 0   Suicidal thoughts 0 0   PHQ-9 Score 5 0   Difficult doing work/chores Not difficult at all      Scribe for Treatment Team: Elza Rafter,  Theresia Majors 09/20/2023 3:29 PM

## 2023-09-20 NOTE — Group Note (Signed)
 Recreation Therapy Group Note   Group Topic:Team Building  Group Date: 09/20/2023 Start Time: 1400 End Time: 1500 Facilitators: Rosina Lowenstein, LRT, CTRS Location: Courtyard  Group Description: Trivia. LRT reads off trivia question for patients to hear while also giving them a copy of the trivia questions. Pts are given a set time limit to answer the question correctly. LRT waits for all patients to make a guess out loud. LRT facilitated post-game discussion on the importance of working well with others, active listening, and communicating. LRT and pts discussed how this can apply to life post-discharge.   Goal Area(s) Addressed: Patient will increase communication skills.  Patient will increase frustration tolerance skills. Patient will practice active listening.    Affect/Mood: N/A   Participation Level: Did not attend    Clinical Observations/Individualized Feedback: Patient did not attend group.   Plan: Continue to engage patient in RT group sessions 2-3x/week.   [address] LRT, CTRS 09/20/2023 4:14 PM

## 2023-09-21 DIAGNOSIS — F319 Bipolar disorder, unspecified: Secondary | ICD-10-CM | POA: Diagnosis not present

## 2023-09-21 NOTE — Progress Notes (Signed)
 Have been reached out by patient's RN regarding Coreg dosing.  Per RN report due to borderline soft blood pressure Decortin has not been given in the 8 AM and ED feeding 6 PM. Per chart review patient has been admitted under psychiatry service for the management of severe depression and anxiety as well as mood instability.  Informed RN to reach out to on-call psychiatry regarding any patient related questions.  Tereasa Coop, MD [facility] 09/21/2023, 8:56 PM

## 2023-09-21 NOTE — Progress Notes (Signed)
 Patient spent most of the shift resting in the bed. She was pleasant on approach, She was compliant with medication regime on the shift. She denies SI, HI and AVH at this time.

## 2023-09-21 NOTE — Plan of Care (Signed)

## 2023-09-21 NOTE — Plan of Care (Signed)
 D: Pt alert and oriented. Pt denies experiencing any anxiety/depression at this time. Pt denies experiencing any pain at this time. Pt denies experiencing any SI/HI, or AVH at this time.   A: Scheduled medications administered to pt, per MD orders. Support and encouragement provided. Frequent verbal contact made. Routine safety checks conducted q15 minutes.   R: No adverse drug reactions noted. Pt verbally contracts for safety at this time. Pt compliant with medications and treatment plan. Pt interacts well with others on the unit. Pt remains safe at this time. Plan of care ongoing.  Pt's 1000 and 1700 scheduled carvedilol was held d/t low BP, MD notified and aware.   Problem: Education: Goal: Emotional status will improve Outcome: Progressing Goal: Mental status will improve Outcome: Progressing

## 2023-09-21 NOTE — Progress Notes (Signed)
 [facility] MD Progress Note  09/21/2023 1:43 PM KAYANN MAJ  MRN:  [id number]  Rachel Woodard is a 73 y.o. female admitted: Presented to the ED on 09/19/2023  2:17 PM for decompensating bipolar disorder. She carries the psychiatric diagnoses of bipolar disorder and has a past medical history of  hypertension, myopia, OSA. Her current presentation of unconsolable crying, laughing, hallucinations is most consistent with decompensating bipolar disorder.  Patient is admitted for geropsych unit for further stabilization.  Subjective:  Chart reviewed, case discussed in multidisciplinary meeting, patient seen during rounds.  Patient is noted to be sitting in the dayroom watching TV.  She offers no complaints.  She is not displaying any overt new delusions except for her comments about seeing the spirits of her family members intermittently.  She denies auditory/visual hallucinations.  She denies ideas of reference.  She denies feeling depressed or anxious.  Confirmed with [facility] and all her home medications were restarted including Lamictal, Seroquel, Wellbutrin.  Patient is tolerating medications very well and is not agreeable for any adjustment.  Patient is not endorsing any panic attacks.   Sleep: Fair  Appetite:  Fair  Past Psychiatric History: see h&P Family History:  Family History  Problem Relation Age of Onset   Leukemia Mother    Prostate cancer Father        stage IV   Liver disease Brother    Diabetes Other    Cancer Other    Migraines Neg Hx    Headache Neg Hx    Social History:  Social History   Substance and Sexual Activity  Alcohol Use Not Currently   Comment: seldom     Social History   Substance and Sexual Activity  Drug Use No   Types: Cocaine   Comment: clean x 60 days on 11/28/15    Social History   Socioeconomic History   Marital status: Widowed    Spouse name: Not on file   Number of children: 3   Years of education: 2 yrs college    Highest education level: Not on file  Occupational History   Occupation: Retired  Tobacco Use   Smoking status: Every Day    Current packs/day: 0.50    Average packs/day: 0.5 packs/day for 24.2 years (12.1 ttl pk-yrs)    Types: Cigarettes    Start date: 06/26/1999   Smokeless tobacco: Never   Tobacco comments:    1/2 ppd 03/13/22.   Vaping Use   Vaping status: Never Used  Substance and Sexual Activity   Alcohol use: Not Currently    Comment: seldom   Drug use: No    Types: Cocaine    Comment: clean x 60 days on 11/28/15   Sexual activity: Yes  Other Topics Concern   Not on file  Social History Narrative   Widowed   3 children   Lives at home alone   disabled Comptroller   Retired      Caffeine:1-2 cups/day   Right handed   Social Drivers of Health   Financial Resource Strain: Not on file  Food Insecurity: No Food Insecurity (09/20/2023)   Hunger Vital Sign    Worried About Running Out of Food in the Last Year: Never true    Ran Out of Food in the Last Year: Never true  Transportation Needs: No Transportation Needs (09/20/2023)   PRAPARE - Administrator, Civil Service (Medical): No    Lack of Transportation (Non-Medical): No  Physical  Activity: Inactive (12/29/2018)   Exercise Vital Sign    Days of Exercise per Week: 0 days    Minutes of Exercise per Session: 0 min  Stress: No Stress Concern Present (12/29/2018)   [employer] of Occupational Health - Occupational Stress Questionnaire    Feeling of Stress : Not at all  Social Connections: Unknown (09/20/2023)   Social Connection and Isolation Panel [NHANES]    Frequency of Communication with Friends and Family: More than three times a week    Frequency of Social Gatherings with Friends and Family: Once a week    Attends Religious Services: Not on file    Active Member of Clubs or Organizations: Not on file    Attends Banker Meetings: Not on file    Marital Status: Not on file    Past Medical History:  Past Medical History:  Diagnosis Date   Anemia 2001   after gastric bypass   Anxiety    Aortic atherosclerosis (HCC)    Arthritis    Back pain    Bilateral swelling of feet    Bipolar 1 disorder (HCC)    Breast cancer (HCC) 2013   Breast cancer    Radiation therapy   Calcified granuloma of lung    Cigarette nicotine dependence    Cigarette nicotine dependence in remission    Cocaine dependence in remission (HCC)    COPD (chronic obstructive pulmonary disease) (HCC)    Depression    Disequilibrium    Fibromyalgia    GERD (gastroesophageal reflux disease)    HSV infection    Hypertension    Hypertensive kidney disease with stage 3a chronic kidney disease (HCC)    Hypertensive kidney disease with stage 3b chronic kidney disease (HCC)    Osteoarthritis    Personal history of infectious disease    Personal history of radiation therapy 2013   Pneumonia    Pre-diabetes    PTSD (post-traumatic stress disorder)    Seasonal allergic rhinitis due to pollen    Sleep apnea    uses CPAP   SOB (shortness of breath)    Vitamin D deficiency     Past Surgical History:  Procedure Laterality Date   ABDOMINAL HYSTERECTOMY  2018   BREAST LUMPECTOMY Right 2013   GASTRIC BYPASS  2001   TOTAL KNEE ARTHROPLASTY Left 12/22/2018   Procedure: LEFT TOTAL KNEE ARTHROPLASTY;  Surgeon: Tarry Kos, MD;  Location: [facility] OR;  Service: Orthopedics;  Laterality: Left;    Current Medications: Current Facility-Administered Medications  Medication Dose Route Frequency Provider Last Rate Last Admin   acetaminophen (TYLENOL) tablet 650 mg  650 mg Oral Q6H PRN Onuoha, Chinwendu V, NP   650 mg at 09/20/23 0850   alum & mag hydroxide-simeth (MAALOX/MYLANTA) 200-200-20 MG/5ML suspension 15 mL  15 mL Oral Q6H PRN Onuoha, Chinwendu V, NP       buPROPion (WELLBUTRIN XL) 24 hr tablet 300 mg  300 mg Oral Daily Verner Chol, MD   300 mg at 09/21/23 0811   carvedilol (COREG) tablet 3.125  mg  3.125 mg Oral BID WC Verner Chol, MD       hydrochlorothiazide (HYDRODIURIL) tablet 25 mg  25 mg Oral Daily Verner Chol, MD   25 mg at 09/21/23 8657   hydrOXYzine (ATARAX) tablet 25 mg  25 mg Oral TID PRN Onuoha, Chinwendu V, NP       lamoTRIgine (LAMICTAL) tablet 25 mg  25 mg Oral BID Verner Chol, MD   25  mg at 09/21/23 0811   magnesium hydroxide (MILK OF MAGNESIA) suspension 15 mL  15 mL Oral Daily PRN Onuoha, Chinwendu V, NP       nicotine (NICODERM CQ - dosed in mg/24 hours) patch 21 mg  21 mg Transdermal Daily Verner Chol, MD   21 mg at 09/21/23 1610   OLANZapine zydis (ZYPREXA) disintegrating tablet 5 mg  5 mg Oral TID PRN Onuoha, Chinwendu V, NP       QUEtiapine (SEROQUEL) tablet 200 mg  200 mg Oral QHS Verner Chol, MD   200 mg at 09/20/23 2132    Lab Results:  Results for orders placed or performed during the hospital encounter of 09/19/23 (from the past 48 hours)  CBG monitoring, ED     Status: Abnormal   Collection Time: 09/19/23  2:23 PM  Result Value Ref Range   Glucose-Capillary 199 (H) 70 - 99 mg/dL    Comment: Glucose reference range applies only to samples taken after fasting for at least 8 hours.  Comprehensive metabolic panel     Status: Abnormal   Collection Time: 09/19/23  2:50 PM  Result Value Ref Range   Sodium 139 135 - 145 mmol/L   Potassium 3.3 (L) 3.5 - 5.1 mmol/L   Chloride 106 98 - 111 mmol/L   CO2 23 22 - 32 mmol/L   Glucose, Bld 155 (H) 70 - 99 mg/dL    Comment: Glucose reference range applies only to samples taken after fasting for at least 8 hours.   BUN 22 8 - 23 mg/dL   Creatinine, Ser 9.60 (H) 0.44 - 1.00 mg/dL   Calcium 9.5 8.9 - 45.4 mg/dL   Total Protein 7.7 6.5 - 8.1 g/dL   Albumin 3.9 3.5 - 5.0 g/dL   AST 25 15 - 41 U/L   ALT 19 0 - 44 U/L   Alkaline Phosphatase 74 38 - 126 U/L   Total Bilirubin 0.7 0.0 - 1.2 mg/dL   GFR, Estimated 39 (L) >60 mL/min    Comment: (NOTE) Calculated using the CKD-EPI Creatinine Equation  (2021)    Anion gap 10 5 - 15    Comment: Performed at [facility], [address] [place], [place] [address]  Ethanol     Status: None   Collection Time: 09/19/23  2:50 PM  Result Value Ref Range   Alcohol, Ethyl (B) <10 <10 mg/dL    Comment: (NOTE) Lowest detectable limit for serum alcohol is 10 mg/dL.  For medical purposes only. Performed at [facility], [address] [place], [place] [address]   CBC     Status: Abnormal   Collection Time: 09/19/23  2:50 PM  Result Value Ref Range   WBC 6.9 4.0 - 10.5 K/uL   RBC 5.12 (H) 3.87 - 5.11 MIL/uL   Hemoglobin 14.1 12.0 - 15.0 g/dL   HCT 29.5 62.1 - 30.8 %   MCV 87.5 80.0 - 100.0 fL   MCH 27.5 26.0 - 34.0 pg   MCHC 31.5 30.0 - 36.0 g/dL   RDW 65.7 84.6 - 96.2 %   Platelets 280 150 - 400 K/uL   nRBC 0.0 0.0 - 0.2 %    Comment: Performed at [facility], [address] [place], [place] [address]  Urinalysis, w/ Reflex to Culture (Infection Suspected) -Urine, Unspecified Source     Status: Abnormal   Collection Time: 09/19/23  7:45 PM  Result Value Ref Range   Specimen Source URINE,  CLEAN CATCH     Comment: CORRECTED ON 03/27 AT 1956: PREVIOUSLY REPORTED AS URINE, UNSPE   Color, Urine YELLOW YELLOW   APPearance HAZY (A) CLEAR   Specific Gravity, Urine 1.006 1.005 - 1.030   pH 6.0 5.0 - 8.0   Glucose, UA NEGATIVE NEGATIVE mg/dL   Hgb urine dipstick NEGATIVE NEGATIVE   Bilirubin Urine NEGATIVE NEGATIVE   Ketones, ur NEGATIVE NEGATIVE mg/dL   Protein, ur NEGATIVE NEGATIVE mg/dL   Nitrite NEGATIVE NEGATIVE   Leukocytes,Ua NEGATIVE NEGATIVE   RBC / HPF 0-5 0 - 5 RBC/hpf   WBC, UA 0-5 0 - 5 WBC/hpf    Comment:        Reflex urine culture not performed if WBC <=10, OR if Squamous epithelial cells >5. If Squamous epithelial cells >5 suggest recollection.    Bacteria, UA NONE SEEN NONE SEEN   Squamous Epithelial / HPF 0-5 0 - 5 /HPF    Comment: Performed at [facility], [address], [place], [place] [address]  Urine rapid drug screen (hosp performed)     Status: None   Collection Time: 09/19/23  7:45 PM  Result Value Ref Range   Opiates NONE DETECTED NONE DETECTED   Cocaine NONE DETECTED NONE DETECTED   Benzodiazepines NONE DETECTED NONE DETECTED   Amphetamines NONE DETECTED NONE DETECTED   Tetrahydrocannabinol NONE DETECTED NONE DETECTED   Barbiturates NONE DETECTED NONE DETECTED    Comment: (NOTE) DRUG SCREEN FOR MEDICAL PURPOSES ONLY.  IF CONFIRMATION IS NEEDED FOR ANY PURPOSE, NOTIFY LAB WITHIN 5 DAYS.  LOWEST DETECTABLE LIMITS FOR URINE DRUG SCREEN Drug Class                     Cutoff (ng/mL) Amphetamine and metabolites    1000 Barbiturate and metabolites    200 Benzodiazepine                 200 Opiates and metabolites        300 Cocaine and metabolites        300 THC                            50 Performed at [facility], [address] [place], [place] [address]   SARS Coronavirus 2 by RT PCR (hospital order, performed in [facility] hospital lab) *cepheid single result test* Anterior Nasal Swab     Status: None   Collection Time: 09/19/23  7:48 PM   Specimen: Anterior Nasal Swab  Result Value Ref Range   SARS Coronavirus 2 by RT PCR NEGATIVE NEGATIVE    Comment: (NOTE) SARS-CoV-2 target nucleic acids are NOT DETECTED.  The SARS-CoV-2 RNA is generally detectable in upper and lower respiratory specimens during the acute phase of infection. The lowest concentration of SARS-CoV-2 viral copies this assay can detect is 250 copies / mL. A negative result does not preclude SARS-CoV-2 infection and should not be used as the sole basis for treatment or other patient management decisions.  A negative result may occur with improper specimen collection / handling, submission of specimen other than nasopharyngeal swab, presence of viral mutation(s) within the areas targeted by this assay, and  inadequate number of viral copies (<250 copies / mL). A negative result must be combined with clinical observations, patient history, and epidemiological information.  Fact Sheet for Patients:   RoadLapTop.co.za  Fact Sheet for Healthcare Providers: http://kim-miller.com/  This test  is not yet approved or  cleared by the [place] and has been authorized for detection and/or diagnosis of SARS-CoV-2 by FDA under an Emergency Use Authorization (EUA).  This EUA will remain in effect (meaning this test can be used) for the duration of the COVID-19 declaration under Section 564(b)(1) of the Act, 21 U.S.C. section 360bbb-3(b)(1), unless the authorization is terminated or revoked sooner.  Performed at [facility], [address] [place], [place] [address]     Blood Alcohol level:  Lab Results  Component Value Date   ETH <10 09/19/2023    Metabolic Disorder Labs: Lab Results  Component Value Date   HGBA1C 6.0 (H) 06/06/2023   No results found for: "PROLACTIN" Lab Results  Component Value Date   CHOL 170 07/11/2023   TRIG 67 07/11/2023   HDL 64 07/11/2023   CHOLHDL 3.1 07/11/2017   VLDL 17 08/08/2016   LDLCALC 93 07/11/2023   LDLCALC 87 08/27/2022    Physical Findings: AIMS:  , ,  ,  ,    CIWA:    COWS:      Psychiatric Specialty Exam:  Presentation  General Appearance:  Appropriate for Environment; Casual  Eye Contact: Fair  Speech: Clear and Coherent  Speech Volume: Normal    Mood and Affect  Mood: Euthymic  Affect: Appropriate   Thought Process  Thought Processes: Coherent  Descriptions of Associations:Intact  Orientation:Full (Time, Place and Person)  Thought Content:Logical  Hallucinations:Hallucinations: Auditory; None  Ideas of Reference:None  Suicidal Thoughts:Suicidal Thoughts: No  Homicidal Thoughts:Homicidal Thoughts: No   Sensorium   Memory: Immediate Fair; Recent Fair; Remote Fair  Judgment: Impaired  Insight: None   Executive Functions  Concentration: Fair  Attention Span: Fair  Recall: [employer] of Knowledge: Fair  Language: Fair   Psychomotor Activity  Psychomotor Activity: Psychomotor Activity: Normal  Musculoskeletal: Strength & Muscle Tone: within normal limits Gait & Station: normal Assets  Assets: Manufacturing systems engineer; Financial Resources/Insurance; Resilience; Physical Health    Physical Exam: Physical Exam Vitals and nursing note reviewed.  HENT:     Head: Normocephalic.     Nose: Nose normal.     Mouth/Throat:     Mouth: Mucous membranes are moist.  Cardiovascular:     Rate and Rhythm: Normal rate.  Pulmonary:     Breath sounds: Normal breath sounds.  Abdominal:     General: Bowel sounds are normal.  Skin:    General: Skin is warm.  Neurological:     General: No focal deficit present.     Mental Status: She is alert.    Review of Systems  Constitutional: Negative.   HENT: Negative.    Eyes: Negative.   Respiratory: Negative.    Neurological: Negative.    Blood pressure (!) 113/41, pulse 71, temperature 98.1 F (36.7 C), resp. rate 16, height 5\' 6"  (1.676 m), weight 100.7 kg, SpO2 98%. Body mass index is 35.83 kg/m.  Diagnosis: Principal Problem:   Bipolar affective disorder [facility])  Clinical Decision Making: Patient with history of depression and anxiety brought in for mood lability, crying spell with no triggers.  She is admitted to inpatient facility for further stabilization   Treatment Plan Summary:   Safety and Monitoring:             -- Voluntary admission to inpatient psychiatric unit for safety, stabilization and treatment             -- Daily contact with patient to assess  and evaluate symptoms and progress in treatment             -- Patient's case to be discussed in multi-disciplinary team meeting             -- Observation Level: q15  minute checks             -- Vital signs:  q12 hours             -- Precautions: suicide, elopement, and assault   2. Psychiatric Diagnoses and Treatment: Call [facility] [place] and confirm the medications               Wellbutrin XL 300mg  daily; Lamictal 25 mg daily, Lamictal XL 50 mg daily, Seroquel 200 mg nightly Past medication trial-Lexapro stopped since October 2020 for   -- The risks/benefits/side-effects/alternatives to this medication were discussed in detail with the patient and time was given for questions. The patient consents to medication trial.                -- Metabolic profile and EKG monitoring obtained while on an atypical antipsychotic (BMI: Lipid Panel: HbgA1c: QTc:)              -- Encouraged patient to participate in unit milieu and in scheduled group therapies                            3. Medical Issues Being Addressed:  No urgent medical needs noted   4. Discharge Planning:              -- Social work and case management to assist with discharge planning and identification of hospital follow-up needs prior to discharge             -- Estimated LOS: 1-2 days possible on 09/23/23             -- Discharge Concerns: Need to establish a safety plan; Medication compliance and effectiveness             -- Discharge Goals: Return home with outpatient referrals follow ups   Physician Treatment Plan for Primary Diagnosis: Bipolar affective disorder (HCC) Long Term Goal(s): Improvement in symptoms so as ready for discharge   Short Term Goals: Ability to identify changes in lifestyle to reduce recurrence of condition will improve, Ability to verbalize feelings will improve, Ability to disclose and discuss suicidal ideas, Ability to demonstrate self-control will improve, and Ability to identify and develop effective coping behaviors will improve   Physician Treatment Plan for Secondary Diagnosis: Principal Problem:   Bipolar affective disorder (HCC)   Long  Term Goal(s): Improvement in symptoms so as ready for discharge   Short Term Goals: Ability to identify changes in lifestyle to reduce recurrence of condition will improve, Ability to verbalize feelings will improve, Ability to disclose and discuss suicidal ideas, Ability to demonstrate self-control will improve, Ability to identify and develop effective coping behaviors will improve, Ability to maintain clinical measurements within normal limits will improve, Compliance with prescribed medications will improve, and Ability to identify triggers associated with substance abuse/mental health issues will improve   Verner Chol, MD 09/21/2023, 1:43 PM

## 2023-09-22 DIAGNOSIS — F319 Bipolar disorder, unspecified: Secondary | ICD-10-CM | POA: Diagnosis not present

## 2023-09-22 NOTE — Progress Notes (Signed)
   09/21/23 2300  Psych Admission Type (Psych Patients Only)  Admission Status Voluntary  Psychosocial Assessment  Patient Complaints None  Eye Contact Fair  Facial Expression Worried  Affect Appropriate to circumstance  Speech Logical/coherent  Interaction Assertive  Motor Activity Slow  Appearance/Hygiene In scrubs  Behavior Characteristics Cooperative;Calm  Mood Pleasant  Thought Process  Coherency WDL  Content WDL  Delusions None reported or observed  Perception WDL  Hallucination None reported or observed  Judgment WDL  Confusion None  Danger to Self  Current suicidal ideation? Denies (Denies)  Danger to Others  Danger to Others None reported or observed   Initially c/o headache but received good relief with Tylenol. Pt. Also received a dose of Coreg at 2100 as ordered by NP due to B/P of 131/117. B/P after Coreg 123/64.

## 2023-09-22 NOTE — Progress Notes (Signed)
   09/22/23 0926  Psych Admission Type (Psych Patients Only)  Admission Status Voluntary  Psychosocial Assessment  Patient Complaints None  Eye Contact Fair  Facial Expression Blank  Affect Appropriate to circumstance  Speech Logical/coherent  Interaction Assertive  Motor Activity Slow  Appearance/Hygiene In scrubs  Behavior Characteristics Cooperative  Mood Pleasant  Thought Process  Coherency WDL  Content WDL  Delusions None reported or observed  Perception WDL  Hallucination None reported or observed  Judgment WDL  Confusion None  Danger to Self  Current suicidal ideation? Denies  Danger to Others  Danger to Others None reported or observed

## 2023-09-22 NOTE — [facility] Group Notes (Signed)
 LCSW Group Therapy Note  09/22/2023     1:00PM-2:00PM  Type of Therapy and Topic:  Group Therapy:  Feelings about Discharge  Participation Level:  Active        . Description of Group:  Today's process group focused on the topic of concerns patients may have about their discharge.  This included an extensive discussion about how patients might sabotage their aftercare plan.  A special emphasis was on drug and alcohol abuse, since this was a common past or present problem identified by the group members.  Specific plans for how to change people, places, and things were brainstormed.  The discussion centered on 12-step methods that have worked in the past and how to make adjustments so they can be even more effective in the future.  Elements of their lives that can be changed in order to be more assured of success were explored throughout the session.  Therapeutic Goals Patient will be able to identify their apprehensions about discharge. Patient will list ways they have previously self-sabotaged their lives. Patient will be able to verbalize the costs of drinking/drugging for them personally. Patient will be guided to change talk. Patients will give and receive peer support and understand their commonalities.  Summary of Patient Progress: During group, patient expressed full comprehension of the subject matter throughout the session.  Patient remained attentive and involved in the entirety of the discussion.  Plans to remain sober at discharge include specifically the intention to remain her 12-step work.  She shared that she has been sober from cocaine for 3 years, but she also has had 2 previous relapses after 4 years of sobriety.  She also stated she did not start using drugs until she was 73yo.  She provided support and encouragement to other group members, was attentive and pleasant.  Therapeutic Modalities Stages of Change Motivational Interviewing 12-Step Advocacy  Ambrose Mantle,  LCSW 09/22/2023, 2:56 PM

## 2023-09-22 NOTE — Plan of Care (Signed)
   Problem: Education: Goal: Emotional status will improve Outcome: Progressing Goal: Verbalization of understanding the information provided will improve Outcome: Progressing
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
## 2023-09-22 NOTE — Group Note (Signed)
 Date:  09/22/2023 Time:  10:45 PM  Group Topic/Focus:  Wrap-Up Group:   The focus of this group is to help patients review their daily goal of treatment and discuss progress on daily workbooks.    Participation Level:  Active  Participation Quality:  Appropriate  Affect:  Appropriate  Cognitive:  Appropriate  Insight: Good  Engagement in Group:  Engaged  Modes of Intervention:  Discussion  Additional Comments:    Maeola Harman 09/22/2023, 10:45 PM

## 2023-09-22 NOTE — Progress Notes (Signed)
 [facility] MD Progress Note  09/22/2023 1:46 PM Rachel Woodard  MRN:  [id number]  Rachel Woodard is a 73 y.o. female admitted: Presented to the ED on 09/19/2023  2:17 PM for decompensating bipolar disorder. She carries the psychiatric diagnoses of bipolar disorder and has a past medical history of  hypertension, myopia, OSA. Her current presentation of unconsolable crying, laughing, hallucinations is most consistent with decompensating bipolar disorder.  Patient is admitted for geropsych unit for further stabilization.  Subjective:  Chart reviewed, case discussed in multidisciplinary meeting, patient seen during rounds.  Patient is noted to be sitting in the day area.  She offers no complaints.  She reports fair appetite and sleep.  She reports she is looking forward to go back home.  She denies feeling depressed or anxious and denies having any panic episodes since she has been in the hospital.  She denies SI/HI/intent/plan.  She denies auditory/visual hallucinations.  Provider and patient discussed about possible discharge on Tuesday, April 1.   Sleep: Fair  Appetite:  Fair  Past Psychiatric History: see h&P Family History:  Family History  Problem Relation Age of Onset   Leukemia Mother    Prostate cancer Father        stage IV   Liver disease Brother    Diabetes Other    Cancer Other    Migraines Neg Hx    Headache Neg Hx    Social History:  Social History   Substance and Sexual Activity  Alcohol Use Not Currently   Comment: seldom     Social History   Substance and Sexual Activity  Drug Use No   Types: Cocaine   Comment: clean x 60 days on 11/28/15    Social History   Socioeconomic History   Marital status: Widowed    Spouse name: Not on file   Number of children: 3   Years of education: 2 yrs college   Highest education level: Not on file  Occupational History   Occupation: Retired  Tobacco Use   Smoking status: Every Day    Current packs/day: 0.50     Average packs/day: 0.5 packs/day for 24.2 years (12.1 ttl pk-yrs)    Types: Cigarettes    Start date: 06/26/1999   Smokeless tobacco: Never   Tobacco comments:    1/2 ppd 03/13/22.   Vaping Use   Vaping status: Never Used  Substance and Sexual Activity   Alcohol use: Not Currently    Comment: seldom   Drug use: No    Types: Cocaine    Comment: clean x 60 days on 11/28/15   Sexual activity: Yes  Other Topics Concern   Not on file  Social History Narrative   Widowed   3 children   Lives at home alone   disabled Comptroller   Retired      Caffeine:1-2 cups/day   Right handed   Social Drivers of Health   Financial Resource Strain: Not on file  Food Insecurity: No Food Insecurity (09/20/2023)   Hunger Vital Sign    Worried About Running Out of Food in the Last Year: Never true    Ran Out of Food in the Last Year: Never true  Transportation Needs: No Transportation Needs (09/20/2023)   PRAPARE - Administrator, Civil Service (Medical): No    Lack of Transportation (Non-Medical): No  Physical Activity: Inactive (12/29/2018)   Exercise Vital Sign    Days of Exercise per Week: 0 days  Minutes of Exercise per Session: 0 min  Stress: No Stress Concern Present (12/29/2018)   [employer] of Occupational Health - Occupational Stress Questionnaire    Feeling of Stress : Not at all  Social Connections: Unknown (09/20/2023)   Social Connection and Isolation Panel [NHANES]    Frequency of Communication with Friends and Family: More than three times a week    Frequency of Social Gatherings with Friends and Family: Once a week    Attends Religious Services: Not on file    Active Member of Clubs or Organizations: Not on file    Attends Banker Meetings: Not on file    Marital Status: Not on file   Past Medical History:  Past Medical History:  Diagnosis Date   Anemia 2001   after gastric bypass   Anxiety    Aortic atherosclerosis (HCC)     Arthritis    Back pain    Bilateral swelling of feet    Bipolar 1 disorder (HCC)    Breast cancer (HCC) 2013   Breast cancer    Radiation therapy   Calcified granuloma of lung    Cigarette nicotine dependence    Cigarette nicotine dependence in remission    Cocaine dependence in remission (HCC)    COPD (chronic obstructive pulmonary disease) (HCC)    Depression    Disequilibrium    Fibromyalgia    GERD (gastroesophageal reflux disease)    HSV infection    Hypertension    Hypertensive kidney disease with stage 3a chronic kidney disease (HCC)    Hypertensive kidney disease with stage 3b chronic kidney disease (HCC)    Osteoarthritis    Personal history of infectious disease    Personal history of radiation therapy 2013   Pneumonia    Pre-diabetes    PTSD (post-traumatic stress disorder)    Seasonal allergic rhinitis due to pollen    Sleep apnea    uses CPAP   SOB (shortness of breath)    Vitamin D deficiency     Past Surgical History:  Procedure Laterality Date   ABDOMINAL HYSTERECTOMY  2018   BREAST LUMPECTOMY Right 2013   GASTRIC BYPASS  2001   TOTAL KNEE ARTHROPLASTY Left 12/22/2018   Procedure: LEFT TOTAL KNEE ARTHROPLASTY;  Surgeon: Tarry Kos, MD;  Location: [facility] OR;  Service: Orthopedics;  Laterality: Left;    Current Medications: Current Facility-Administered Medications  Medication Dose Route Frequency Provider Last Rate Last Admin   acetaminophen (TYLENOL) tablet 650 mg  650 mg Oral Q6H PRN Onuoha, Chinwendu V, NP   650 mg at 09/21/23 2137   alum & mag hydroxide-simeth (MAALOX/MYLANTA) 200-200-20 MG/5ML suspension 15 mL  15 mL Oral Q6H PRN Onuoha, Chinwendu V, NP       buPROPion (WELLBUTRIN XL) 24 hr tablet 300 mg  300 mg Oral Daily Verner Chol, MD   300 mg at 09/22/23 0919   carvedilol (COREG) tablet 3.125 mg  3.125 mg Oral BID WC Verner Chol, MD   3.125 mg at 09/22/23 0919   hydrochlorothiazide (HYDRODIURIL) tablet 25 mg  25 mg Oral Daily Verner Chol, MD   25 mg at 09/22/23 0919   hydrOXYzine (ATARAX) tablet 25 mg  25 mg Oral TID PRN Onuoha, Chinwendu V, NP       lamoTRIgine (LAMICTAL) tablet 25 mg  25 mg Oral BID Verner Chol, MD   25 mg at 09/22/23 0919   magnesium hydroxide (MILK OF MAGNESIA) suspension 15 mL  15 mL Oral  Daily PRN Onuoha, Chinwendu V, NP       nicotine (NICODERM CQ - dosed in mg/24 hours) patch 21 mg  21 mg Transdermal Daily Verner Chol, MD   21 mg at 09/22/23 0935   OLANZapine zydis (ZYPREXA) disintegrating tablet 5 mg  5 mg Oral TID PRN Onuoha, Chinwendu V, NP       QUEtiapine (SEROQUEL) tablet 200 mg  200 mg Oral QHS Verner Chol, MD   200 mg at 09/21/23 2136    Lab Results:  No results found for this or any previous visit (from the past 48 hours).   Blood Alcohol level:  Lab Results  Component Value Date   ETH <10 09/19/2023    Metabolic Disorder Labs: Lab Results  Component Value Date   HGBA1C 6.0 (H) 06/06/2023   No results found for: "PROLACTIN" Lab Results  Component Value Date   CHOL 170 07/11/2023   TRIG 67 07/11/2023   HDL 64 07/11/2023   CHOLHDL 3.1 07/11/2017   VLDL 17 08/08/2016   LDLCALC 93 07/11/2023   LDLCALC 87 08/27/2022    Physical Findings: AIMS:  , ,  ,  ,    CIWA:    COWS:      Psychiatric Specialty Exam:  Presentation  General Appearance:  Appropriate for Environment; Casual  Eye Contact: Fair  Speech: Clear and Coherent  Speech Volume: Normal    Mood and Affect  Mood: Euthymic  Affect: Appropriate   Thought Process  Thought Processes: Coherent  Descriptions of Associations:Intact  Orientation:Full (Time, Place and Person)  Thought Content:Logical  Hallucinations:Hallucinations: None  Ideas of Reference:None  Suicidal Thoughts:Suicidal Thoughts: No  Homicidal Thoughts:Homicidal Thoughts: No   Sensorium  Memory: Recent Fair; Immediate Fair; Remote Fair  Judgment: Fair  Insight: Fair   Art therapist   Concentration: Fair  Attention Span: Fair  Recall: [employer] of Knowledge: Fair  Language: Fair   Psychomotor Activity  Psychomotor Activity: Psychomotor Activity: Normal  Musculoskeletal: Strength & Muscle Tone: within normal limits Gait & Station: normal Assets  Assets: Manufacturing systems engineer; Desire for Improvement; Physical Health    Physical Exam: Physical Exam Vitals and nursing note reviewed.  HENT:     Head: Normocephalic.     Nose: Nose normal.     Mouth/Throat:     Mouth: Mucous membranes are moist.  Cardiovascular:     Rate and Rhythm: Normal rate.  Pulmonary:     Breath sounds: Normal breath sounds.  Abdominal:     General: Bowel sounds are normal.  Skin:    General: Skin is warm.  Neurological:     General: No focal deficit present.     Mental Status: She is alert.    Review of Systems  Constitutional: Negative.   HENT: Negative.    Eyes: Negative.   Respiratory: Negative.    Neurological: Negative.    Blood pressure 124/63, pulse 79, temperature 98.1 F (36.7 C), resp. rate 18, height 5\' 6"  (1.676 m), weight 100.7 kg, SpO2 98%. Body mass index is 35.83 kg/m.  Diagnosis: Principal Problem:   Bipolar affective disorder [facility])  Clinical Decision Making: Patient with history of depression and anxiety brought in for mood lability, crying spell with no triggers.  She is admitted to inpatient facility for further stabilization   Treatment Plan Summary:   Safety and Monitoring:             -- Voluntary admission to inpatient psychiatric unit for safety, stabilization and treatment             --  Daily contact with patient to assess and evaluate symptoms and progress in treatment             -- Patient's case to be discussed in multi-disciplinary team meeting             -- Observation Level: q15 minute checks             -- Vital signs:  q12 hours             -- Precautions: suicide, elopement, and assault   2. Psychiatric Diagnoses  and Treatment: Call [facility] [place] and confirm the medications               Wellbutrin XL 300mg  daily; Lamictal 25 mg daily, Lamictal XL 50 mg daily, Seroquel 200 mg nightly Past medication trial-Lexapro stopped since October 2020 for   -- The risks/benefits/side-effects/alternatives to this medication were discussed in detail with the patient and time was given for questions. The patient consents to medication trial.                -- Metabolic profile and EKG monitoring obtained while on an atypical antipsychotic (BMI: Lipid Panel: HbgA1c: QTc:)              -- Encouraged patient to participate in unit milieu and in scheduled group therapies                            3. Medical Issues Being Addressed:  No urgent medical needs noted   4. Discharge Planning:              -- Social work and case management to assist with discharge planning and identification of hospital follow-up needs prior to discharge             -- Estimated LOS: 1-2 days possible on 09/23/23             -- Discharge Concerns: Need to establish a safety plan; Medication compliance and effectiveness             -- Discharge Goals: Return home with outpatient referrals follow ups   Physician Treatment Plan for Primary Diagnosis: Bipolar affective disorder (HCC) Long Term Goal(s): Improvement in symptoms so as ready for discharge   Short Term Goals: Ability to identify changes in lifestyle to reduce recurrence of condition will improve, Ability to verbalize feelings will improve, Ability to disclose and discuss suicidal ideas, Ability to demonstrate self-control will improve, and Ability to identify and develop effective coping behaviors will improve   Physician Treatment Plan for Secondary Diagnosis: Principal Problem:   Bipolar affective disorder (HCC)   Long Term Goal(s): Improvement in symptoms so as ready for discharge   Short Term Goals: Ability to identify changes in lifestyle to reduce  recurrence of condition will improve, Ability to verbalize feelings will improve, Ability to disclose and discuss suicidal ideas, Ability to demonstrate self-control will improve, Ability to identify and develop effective coping behaviors will improve, Ability to maintain clinical measurements within normal limits will improve, Compliance with prescribed medications will improve, and Ability to identify triggers associated with substance abuse/mental health issues will improve   Verner Chol, MD 09/22/2023, 1:46 PM

## 2023-09-22 NOTE — Group Note (Signed)
 Date:  09/21/2023 Time:  8:30PM  Group Topic/Focus:  Self Care:   The focus of this group is to help patients understand the importance of self-care in order to improve or restore emotional, physical, spiritual, interpersonal, and financial health.    Participation Level:  Active  Participation Quality:  Appropriate  Affect:  Appropriate  Cognitive:  Appropriate  Insight: Appropriate  Engagement in Group:  Engaged  Modes of Intervention:  Education  Additional Comments:    Garry Heater 09/21/2023, 8:30PM

## 2023-09-22 NOTE — Plan of Care (Signed)

## 2023-09-23 DIAGNOSIS — F3131 Bipolar disorder, current episode depressed, mild: Secondary | ICD-10-CM | POA: Diagnosis not present

## 2023-09-23 NOTE — Progress Notes (Signed)
   09/23/23 0715  Psych Admission Type (Psych Patients Only)  Admission Status Voluntary  Psychosocial Assessment  Patient Complaints None  Eye Contact Fair  Facial Expression Blank  Affect Appropriate to circumstance  Speech Logical/coherent  Interaction Assertive  Motor Activity Slow  Appearance/Hygiene In scrubs  Behavior Characteristics Cooperative  Mood Pleasant  Thought Process  Coherency WDL  Content WDL  Delusions None reported or observed  Perception WDL  Hallucination None reported or observed  Judgment WDL  Confusion None  Danger to Self  Current suicidal ideation? Denies  Danger to Others  Danger to Others None reported or observed

## 2023-09-23 NOTE — Progress Notes (Signed)
   09/23/23 2100  Psych Admission Type (Psych Patients Only)  Admission Status Voluntary  Psychosocial Assessment  Patient Complaints None  Eye Contact Fair  Facial Expression Blank  Affect Appropriate to circumstance  Speech Logical/coherent  Interaction Assertive  Motor Activity Slow  Appearance/Hygiene In scrubs  Behavior Characteristics Cooperative  Mood Pleasant  Thought Process  Coherency WDL  Content WDL  Delusions None reported or observed  Perception WDL  Hallucination None reported or observed  Judgment WDL  Confusion None  Danger to Self  Current suicidal ideation? Denies  Danger to Others  Danger to Others None reported or observed

## 2023-09-23 NOTE — Group Note (Signed)
 Recreation Therapy Group Note   Group Topic:Leisure Education  Group Date: 09/23/2023 Start Time: 1400 End Time: 1500 Facilitators: Rosina Lowenstein, LRT, CTRS Location: Courtyard  Group Description: Music. Patients encouraged to name their favorite song(s) for LRT to play song through speaker for group to hear. Patient educated on the definition of leisure and the importance of having different leisure interests outside of the hospital. Group discussed how leisure activities can often be used as Pharmacologist and that listening to music is one example.    Goal Area(s) Addressed:    Patient will identify a current leisure interest.    Patient will practice making a positive decision.   Patient will have the opportunity to try a new leisure activity.    Affect/Mood: Appropriate   Participation Level: Active and Engaged   Participation Quality: Independent   Behavior: Calm and Cooperative   Speech/Thought Process: Coherent   Insight: Good   Judgement: Good   Modes of Intervention: Activity, Exploration, Music, and Socialization   Patient Response to Interventions:  Attentive, Engaged, Interested , and Receptive   Education Outcome:  Acknowledges education   Clinical Observations/Individualized Feedback: Rachel Woodard was active in their participation of session activities and group discussion. Pt identified "[employer], that's my boyfriend" as her favorite type of music. Pt shared that she is looking forward to going home tomorrow and seeing her dog. Pt interacted well with LRT and peers duration of session.    Plan: Continue to engage patient in RT group sessions 2-3x/week.   Rosina Lowenstein, LRT, CTRS 09/23/2023 4:31 PM

## 2023-09-23 NOTE — Progress Notes (Signed)
   09/23/23 0400  Psych Admission Type (Psych Patients Only)  Admission Status Voluntary  Psychosocial Assessment  Patient Complaints None  Eye Contact Fair  Facial Expression Blank  Affect Appropriate to circumstance  Speech Logical/coherent  Interaction Assertive  Motor Activity Slow  Appearance/Hygiene In scrubs  Behavior Characteristics Cooperative  Mood Pleasant  Thought Process  Coherency WDL  Content WDL  Delusions None reported or observed  Perception WDL  Hallucination None reported or observed  Judgment WDL  Confusion None  Danger to Self  Current suicidal ideation? Denies  Danger to Others  Danger to Others None reported or observed

## 2023-09-23 NOTE — Progress Notes (Signed)
 [facility] MD Progress Note  09/23/2023 3:41 PM Rachel Woodard  MRN:  [id number]  Rachel Woodard is a 73 y.o. female admitted: Presented to the ED on 09/19/2023  2:17 PM for decompensating bipolar disorder. She carries the psychiatric diagnoses of bipolar disorder and has a past medical history of  hypertension, myopia, OSA. Her current presentation of unconsolable crying, laughing, hallucinations is most consistent with decompensating bipolar disorder.  Patient is admitted for geropsych unit for further stabilization.  Subjective:  Chart reviewed, case discussed in multidisciplinary meeting, patient seen during rounds.  Patient is noted to be mingling well with peers in the dayroom.  She is excited about her discharge tomorrow.  She denies depression and anxiety.  She reports that she discussed some stressors with her roommate and is going to make some changes when she goes back home.  She denies SI/HI/plan.  She denies auditory/visual hallucinations.  She has fair appetite and sleep   Sleep: Fair  Appetite:  Fair  Past Psychiatric History: see h&P Family History:  Family History  Problem Relation Age of Onset   Leukemia Mother    Prostate cancer Father        stage IV   Liver disease Brother    Diabetes Other    Cancer Other    Migraines Neg Hx    Headache Neg Hx    Social History:  Social History   Substance and Sexual Activity  Alcohol Use Not Currently   Comment: seldom     Social History   Substance and Sexual Activity  Drug Use No   Types: Cocaine   Comment: clean x 60 days on 11/28/15    Social History   Socioeconomic History   Marital status: Widowed    Spouse name: Not on file   Number of children: 3   Years of education: 2 yrs college   Highest education level: Not on file  Occupational History   Occupation: Retired  Tobacco Use   Smoking status: Every Day    Current packs/day: 0.50    Average packs/day: 0.5 packs/day for 24.2 years (12.1 ttl  pk-yrs)    Types: Cigarettes    Start date: 06/26/1999   Smokeless tobacco: Never   Tobacco comments:    1/2 ppd 03/13/22.   Vaping Use   Vaping status: Never Used  Substance and Sexual Activity   Alcohol use: Not Currently    Comment: seldom   Drug use: No    Types: Cocaine    Comment: clean x 60 days on 11/28/15   Sexual activity: Yes  Other Topics Concern   Not on file  Social History Narrative   Widowed   3 children   Lives at home alone   disabled Comptroller   Retired      Caffeine:1-2 cups/day   Right handed   Social Drivers of Health   Financial Resource Strain: Not on file  Food Insecurity: No Food Insecurity (09/20/2023)   Hunger Vital Sign    Worried About Running Out of Food in the Last Year: Never true    Ran Out of Food in the Last Year: Never true  Transportation Needs: No Transportation Needs (09/20/2023)   PRAPARE - Administrator, Civil Service (Medical): No    Lack of Transportation (Non-Medical): No  Physical Activity: Inactive (12/29/2018)   Exercise Vital Sign    Days of Exercise per Week: 0 days    Minutes of Exercise per Session: 0 min  Stress: No Stress Concern Present (12/29/2018)   [employer] of Occupational Health - Occupational Stress Questionnaire    Feeling of Stress : Not at all  Social Connections: Unknown (09/20/2023)   Social Connection and Isolation Panel [NHANES]    Frequency of Communication with Friends and Family: More than three times a week    Frequency of Social Gatherings with Friends and Family: Once a week    Attends Religious Services: Not on file    Active Member of Clubs or Organizations: Not on file    Attends Banker Meetings: Not on file    Marital Status: Not on file   Past Medical History:  Past Medical History:  Diagnosis Date   Anemia 2001   after gastric bypass   Anxiety    Aortic atherosclerosis (HCC)    Arthritis    Back pain    Bilateral swelling of feet     Bipolar 1 disorder (HCC)    Breast cancer (HCC) 2013   Breast cancer    Radiation therapy   Calcified granuloma of lung    Cigarette nicotine dependence    Cigarette nicotine dependence in remission    Cocaine dependence in remission (HCC)    COPD (chronic obstructive pulmonary disease) (HCC)    Depression    Disequilibrium    Fibromyalgia    GERD (gastroesophageal reflux disease)    HSV infection    Hypertension    Hypertensive kidney disease with stage 3a chronic kidney disease (HCC)    Hypertensive kidney disease with stage 3b chronic kidney disease (HCC)    Osteoarthritis    Personal history of infectious disease    Personal history of radiation therapy 2013   Pneumonia    Pre-diabetes    PTSD (post-traumatic stress disorder)    Seasonal allergic rhinitis due to pollen    Sleep apnea    uses CPAP   SOB (shortness of breath)    Vitamin D deficiency     Past Surgical History:  Procedure Laterality Date   ABDOMINAL HYSTERECTOMY  2018   BREAST LUMPECTOMY Right 2013   GASTRIC BYPASS  2001   TOTAL KNEE ARTHROPLASTY Left 12/22/2018   Procedure: LEFT TOTAL KNEE ARTHROPLASTY;  Surgeon: Tarry Kos, MD;  Location: [facility] OR;  Service: Orthopedics;  Laterality: Left;    Current Medications: Current Facility-Administered Medications  Medication Dose Route Frequency Provider Last Rate Last Admin   acetaminophen (TYLENOL) tablet 650 mg  650 mg Oral Q6H PRN Onuoha, Chinwendu V, NP   650 mg at 09/21/23 2137   alum & mag hydroxide-simeth (MAALOX/MYLANTA) 200-200-20 MG/5ML suspension 15 mL  15 mL Oral Q6H PRN Onuoha, Chinwendu V, NP       buPROPion (WELLBUTRIN XL) 24 hr tablet 300 mg  300 mg Oral Daily Verner Chol, MD   300 mg at 09/23/23 0940   carvedilol (COREG) tablet 3.125 mg  3.125 mg Oral BID WC Verner Chol, MD   3.125 mg at 09/23/23 0941   hydrochlorothiazide (HYDRODIURIL) tablet 25 mg  25 mg Oral Daily Verner Chol, MD   25 mg at 09/23/23 0940   hydrOXYzine (ATARAX)  tablet 25 mg  25 mg Oral TID PRN Onuoha, Chinwendu V, NP   25 mg at 09/22/23 2257   lamoTRIgine (LAMICTAL) tablet 25 mg  25 mg Oral BID Verner Chol, MD   25 mg at 09/23/23 0941   magnesium hydroxide (MILK OF MAGNESIA) suspension 15 mL  15 mL Oral Daily PRN Onuoha, Chinwendu V,  NP       nicotine (NICODERM CQ - dosed in mg/24 hours) patch 21 mg  21 mg Transdermal Daily Verner Chol, MD   21 mg at 09/23/23 0950   OLANZapine zydis (ZYPREXA) disintegrating tablet 5 mg  5 mg Oral TID PRN Onuoha, Chinwendu V, NP       QUEtiapine (SEROQUEL) tablet 200 mg  200 mg Oral QHS Verner Chol, MD   200 mg at 09/22/23 2257    Lab Results:  No results found for this or any previous visit (from the past 48 hours).   Blood Alcohol level:  Lab Results  Component Value Date   ETH <10 09/19/2023    Metabolic Disorder Labs: Lab Results  Component Value Date   HGBA1C 6.0 (H) 06/06/2023   No results found for: "PROLACTIN" Lab Results  Component Value Date   CHOL 170 07/11/2023   TRIG 67 07/11/2023   HDL 64 07/11/2023   CHOLHDL 3.1 07/11/2017   VLDL 17 08/08/2016   LDLCALC 93 07/11/2023   LDLCALC 87 08/27/2022    Physical Findings: AIMS:  , ,  ,  ,    CIWA:    COWS:      Psychiatric Specialty Exam:  Presentation  General Appearance:  Appropriate for Environment; Casual  Eye Contact: Fair  Speech: Clear and Coherent  Speech Volume: Normal    Mood and Affect  Mood: Euthymic  Affect: Appropriate   Thought Process  Thought Processes: Coherent  Descriptions of Associations:Intact  Orientation:Full (Time, Place and Person)  Thought Content:Logical  Hallucinations:Hallucinations: None  Ideas of Reference:None  Suicidal Thoughts:Suicidal Thoughts: No  Homicidal Thoughts:Homicidal Thoughts: No   Sensorium  Memory: Recent Fair; Immediate Fair; Remote Fair  Judgment: Impaired  Insight: Shallow   Executive Functions  Concentration: Fair  Attention  Span: Fair  Recall: [employer] of Knowledge: Fair  Language: Fair   Psychomotor Activity  Psychomotor Activity: Psychomotor Activity: Normal  Musculoskeletal: Strength & Muscle Tone: within normal limits Gait & Station: normal Assets  Assets: Manufacturing systems engineer; Desire for Improvement; Physical Health    Physical Exam: Physical Exam Vitals and nursing note reviewed.  HENT:     Head: Normocephalic.     Nose: Nose normal.     Mouth/Throat:     Mouth: Mucous membranes are moist.  Cardiovascular:     Rate and Rhythm: Normal rate.  Pulmonary:     Breath sounds: Normal breath sounds.  Abdominal:     General: Bowel sounds are normal.  Skin:    General: Skin is warm.  Neurological:     General: No focal deficit present.     Mental Status: She is alert.    Review of Systems  Constitutional: Negative.   HENT: Negative.    Eyes: Negative.   Respiratory: Negative.    Neurological: Negative.    Blood pressure (!) 128/53, pulse 72, temperature 98.5 F (36.9 C), resp. rate 18, height 5\' 6"  (1.676 m), weight 100.7 kg, SpO2 98%. Body mass index is 35.83 kg/m.  Diagnosis: Principal Problem:   Bipolar affective disorder [facility])  Clinical Decision Making: Patient with history of depression and anxiety brought in for mood lability, crying spell with no triggers.  She is admitted to inpatient facility for further stabilization   Treatment Plan Summary:   Safety and Monitoring:             -- Voluntary admission to inpatient psychiatric unit for safety, stabilization and treatment             --  Daily contact with patient to assess and evaluate symptoms and progress in treatment             -- Patient's case to be discussed in multi-disciplinary team meeting             -- Observation Level: q15 minute checks             -- Vital signs:  q12 hours             -- Precautions: suicide, elopement, and assault   2. Psychiatric Diagnoses and Treatment: Call [facility], [place] and confirm the medications               Wellbutrin XL 300mg  daily; Lamictal 25 mg daily, Lamictal XL 50 mg daily, Seroquel 200 mg nightly Past medication trial-Lexapro stopped since October 2020 for   -- The risks/benefits/side-effects/alternatives to this medication were discussed in detail with the patient and time was given for questions. The patient consents to medication trial.                -- Metabolic profile and EKG monitoring obtained while on an atypical antipsychotic (BMI: Lipid Panel: HbgA1c: QTc:)              -- Encouraged patient to participate in unit milieu and in scheduled group therapies                            3. Medical Issues Being Addressed:  No urgent medical needs noted   4. Discharge Planning:              -- Social work and case management to assist with discharge planning and identification of hospital follow-up needs prior to discharge             -- Estimated LOS: 1-2 days possible on 09/23/23             -- Discharge Concerns: Need to establish a safety plan; Medication compliance and effectiveness             -- Discharge Goals: Return home with outpatient referrals follow ups   Physician Treatment Plan for Primary Diagnosis: Bipolar affective disorder (HCC) Long Term Goal(s): Improvement in symptoms so as ready for discharge   Short Term Goals: Ability to identify changes in lifestyle to reduce recurrence of condition will improve, Ability to verbalize feelings will improve, Ability to disclose and discuss suicidal ideas, Ability to demonstrate self-control will improve, and Ability to identify and develop effective coping behaviors will improve   Physician Treatment Plan for Secondary Diagnosis: Principal Problem:   Bipolar affective disorder (HCC)   Long Term Goal(s): Improvement in symptoms so as ready for discharge   Short Term Goals: Ability to identify changes in lifestyle to reduce recurrence of condition will improve,  Ability to verbalize feelings will improve, Ability to disclose and discuss suicidal ideas, Ability to demonstrate self-control will improve, Ability to identify and develop effective coping behaviors will improve, Ability to maintain clinical measurements within normal limits will improve, Compliance with prescribed medications will improve, and Ability to identify triggers associated with substance abuse/mental health issues will improve   Verner Chol, MD 09/23/2023, 3:41 PM

## 2023-09-24 DIAGNOSIS — F3131 Bipolar disorder, current episode depressed, mild: Secondary | ICD-10-CM | POA: Diagnosis not present

## 2023-09-24 MED ORDER — NICOTINE 21 MG/24HR TD PT24: 21.0000 mg | MEDICATED_PATCH | Freq: Every day | TRANSDERMAL | 0 refills | Status: AC

## 2023-09-24 NOTE — [facility] Counselor (Signed)
 Pt given blank IM due to IM not being scanned in at this time by medical records.   Reynaldo Minium, MSW, [place] 09/24/2023 9:30 AM

## 2023-09-24 NOTE — Group Note (Signed)
 Date:  09/24/2023 Time:  6:31 AM  Group Topic/Focus:  Wrap-Up Group:   The focus of this group is to help patients review their daily goal of treatment and discuss progress on daily workbooks.    Participation Level:  Did Not Attend  Participation Quality:      Affect:      Cognitive:      Insight: None  Engagement in Group:      Modes of Intervention:      Additional Comments:    Maeola Harman 09/24/2023, 6:31 AM

## 2023-09-24 NOTE — Plan of Care (Signed)
   Problem: Education: Goal: Knowledge of  General Education information/materials will improve Outcome: Progressing Goal: Emotional status will improve Outcome: Progressing Goal: Mental status will improve Outcome: Progressing Goal: Verbalization of understanding the information provided will improve Outcome: Progressing   Problem: Coping: Goal: Ability to verbalize frustrations and anger appropriately will improve Outcome: Progressing Goal: Ability to demonstrate self-control will improve Outcome: Progressing

## 2023-09-24 NOTE — Care Management Important Message (Signed)
 Important Message  Patient Details  Name: Rachel Woodard MRN: [id number] Date of Birth: Dec 08, 1950   Important Message Given:  Yes - Medicare IM     Elza Rafter, LCSWA 09/24/2023, 9:29 AM

## 2023-09-24 NOTE — Discharge Summary (Signed)
 Physician Discharge Summary Note  Patient:  Rachel Woodard is an 73 y.o., female MRN:  [id number] DOB:  30-May-1951 Patient phone:  [phone] (home)  Patient address:   [address] [place] [address],    Date of Admission:  09/20/2023 Date of Discharge: September 24, 2023  Reason for Admission:  Rachel Woodard is a 73 y.o. female admitted: Presented to the ED on 09/19/2023  2:17 PM for decompensating bipolar disorder. She carries the psychiatric diagnoses of bipolar disorder and has a past medical history of  hypertension, myopia, OSA. Her current presentation of unconsolable crying, laughing, hallucinations is most consistent with decompensating bipolar disorder.  Patient is admitted for geropsych unit for further stabilization   Principal Problem: Bipolar affective disorder [facility]) Discharge Diagnoses: Principal Problem:   Bipolar affective disorder (HCC)   Past Psychiatric History: See H&P  Family Psychiatric  History: See H&P Social History:  Social History   Substance and Sexual Activity  Alcohol Use Not Currently   Comment: seldom     Social History   Substance and Sexual Activity  Drug Use No   Types: Cocaine   Comment: clean x 60 days on 11/28/15    Social History   Socioeconomic History   Marital status: Widowed    Spouse name: Not on file   Number of children: 3   Years of education: 2 yrs college   Highest education level: Not on file  Occupational History   Occupation: Retired  Tobacco Use   Smoking status: Every Day    Current packs/day: 0.50    Average packs/day: 0.5 packs/day for 24.2 years (12.1 ttl pk-yrs)    Types: Cigarettes    Start date: 06/26/1999   Smokeless tobacco: Never   Tobacco comments:    1/2 ppd 03/13/22.   Vaping Use   Vaping status: Never Used  Substance and Sexual Activity   Alcohol use: Not Currently    Comment: seldom   Drug use: No    Types: Cocaine    Comment: clean x 60 days on 11/28/15   Sexual  activity: Yes  Other Topics Concern   Not on file  Social History Narrative   Widowed   3 children   Lives at home alone   disabled Comptroller   Retired      Caffeine:1-2 cups/day   Right handed   Social Drivers of Health   Financial Resource Strain: Not on file  Food Insecurity: No Food Insecurity (09/20/2023)   Hunger Vital Sign    Worried About Running Out of Food in the Last Year: Never true    Ran Out of Food in the Last Year: Never true  Transportation Needs: No Transportation Needs (09/20/2023)   PRAPARE - Administrator, Civil Service (Medical): No    Lack of Transportation (Non-Medical): No  Physical Activity: Inactive (12/29/2018)   Exercise Vital Sign    Days of Exercise per Week: 0 days    Minutes of Exercise per Session: 0 min  Stress: No Stress Concern Present (12/29/2018)   [employer] of Occupational Health - Occupational Stress Questionnaire    Feeling of Stress : Not at all  Social Connections: Unknown (09/20/2023)   Social Connection and Isolation Panel [NHANES]    Frequency of Communication with Friends and Family: More than three times a week    Frequency of Social Gatherings with Friends and Family: Once a week    Attends Religious Services: Not on file  Active Member of Clubs or Organizations: Not on file    Attends Club or Organization Meetings: Not on file    Marital Status: Not on file   Past Medical History:  Past Medical History:  Diagnosis Date   Anemia 2001   after gastric bypass   Anxiety    Aortic atherosclerosis (HCC)    Arthritis    Back pain    Bilateral swelling of feet    Bipolar 1 disorder (HCC)    Breast cancer (HCC) 2013   Breast cancer    Radiation therapy   Calcified granuloma of lung    Cigarette nicotine dependence    Cigarette nicotine dependence in remission    Cocaine dependence in remission (HCC)    COPD (chronic obstructive pulmonary disease) (HCC)    Depression    Disequilibrium     Fibromyalgia    GERD (gastroesophageal reflux disease)    HSV infection    Hypertension    Hypertensive kidney disease with stage 3a chronic kidney disease (HCC)    Hypertensive kidney disease with stage 3b chronic kidney disease (HCC)    Osteoarthritis    Personal history of infectious disease    Personal history of radiation therapy 2013   Pneumonia    Pre-diabetes    PTSD (post-traumatic stress disorder)    Seasonal allergic rhinitis due to pollen    Sleep apnea    uses CPAP   SOB (shortness of breath)    Vitamin D deficiency     Past Surgical History:  Procedure Laterality Date   ABDOMINAL HYSTERECTOMY  2018   BREAST LUMPECTOMY Right 2013   GASTRIC BYPASS  2001   TOTAL KNEE ARTHROPLASTY Left 12/22/2018   Procedure: LEFT TOTAL KNEE ARTHROPLASTY;  Surgeon: Tarry Kos, MD;  Location: [facility] OR;  Service: Orthopedics;  Laterality: Left;   Family History:  Family History  Problem Relation Age of Onset   Leukemia Mother    Prostate cancer Father        stage IV   Liver disease Brother    Diabetes Other    Cancer Other    Migraines Neg Hx    Headache Neg Hx     Hospital Course:  Rachel Woodard is a 73 y.o. female admitted: Presented to the ED on 09/19/2023  2:17 PM for decompensating bipolar disorder. She carries the psychiatric diagnoses of bipolar disorder and has a past medical history of  hypertension, myopia, OSA. Her current presentation of unconsolable crying, laughing, hallucinations is most consistent with decompensating bipolar disorder.  Patient is admitted for geropsych unit for further stabilization.  Multidisciplinary team approach was offered.  Medication management, group/milieu therapy was offered.  Patient was started on her home medications Lamictal 25 mg, since extended release was not available the dose was adjusted to twice daily, Wellbutrin XL 300 mg, Seroquel.  Patient responded to the treatment very well.  Throughout the admission she participating in  the treatment team and groups.  She denied feeling depression or anxious and has not displayed any panic attacks or crying spells.  On the day of discharge she consistently denied SI/HI/intent/plan.  She denies auditory/visual hallucinations.  She lives with a roommate and there are no guns or firearms in the house reportedly.  She has follow-up appointment set up by the social worker.  Physical Findings: AIMS:  , ,  ,  ,    CIWA:    COWS:        Psychiatric Specialty Exam:  Presentation  General Appearance:  Appropriate for Environment; Casual; Well Groomed  Eye Contact: Fair  Speech: Clear and Coherent  Speech Volume: Normal    Mood and Affect  Mood: Euthymic  Affect: Appropriate   Thought Process  Thought Processes: Coherent  Descriptions of Associations:Intact  Orientation:Full (Time, Place and Person)  Thought Content:Logical  Hallucinations:Hallucinations: None  Ideas of Reference:None  Suicidal Thoughts:Suicidal Thoughts: No  Homicidal Thoughts:Homicidal Thoughts: No   Sensorium  Memory: Recent Fair; Immediate Fair  Judgment: Poor  Insight: Fair   Chartered certified accountant: Fair  Attention Span: Fair  Recall: [employer] of Knowledge: Fair  Language: Fair   Psychomotor Activity  Psychomotor Activity: Psychomotor Activity: Normal  Musculoskeletal: Strength & Muscle Tone: within normal limits Gait & Station: normal Assets  Assets: Manufacturing systems engineer; Desire for Improvement; Physical Health   Sleep  Sleep: Sleep: Fair    Physical Exam: Physical Exam ROS Blood pressure (!) 129/90, pulse 71, temperature (!) 97.2 F (36.2 C), resp. rate 14, height 5\' 6"  (1.676 m), weight 100.7 kg, SpO2 100%. Body mass index is 35.83 kg/m.   Social History   Tobacco Use  Smoking Status Every Day   Current packs/day: 0.50   Average packs/day: 0.5 packs/day for 24.2 years (12.1 ttl pk-yrs)   Types: Cigarettes    Start date: 06/26/1999  Smokeless Tobacco Never  Tobacco Comments   1/2 ppd 03/13/22.    Tobacco Cessation:  N/A, patient does not currently use tobacco products   Blood Alcohol level:  Lab Results  Component Value Date   ETH <10 09/19/2023    Metabolic Disorder Labs:  Lab Results  Component Value Date   HGBA1C 6.0 (H) 06/06/2023   No results found for: "PROLACTIN" Lab Results  Component Value Date   CHOL 170 07/11/2023   TRIG 67 07/11/2023   HDL 64 07/11/2023   CHOLHDL 3.1 07/11/2017   VLDL 17 08/08/2016   LDLCALC 93 07/11/2023   LDLCALC 87 08/27/2022    See Psychiatric Specialty Exam and Suicide Risk Assessment completed by Attending Physician prior to discharge.  Discharge destination:  Home  Is patient on multiple antipsychotic therapies at discharge:  No   Has Patient had three or more failed trials of antipsychotic monotherapy by history:  No  Recommended Plan for Multiple Antipsychotic Therapies: NA  Discharge Instructions     Diet - low sodium heart healthy   Complete by: As directed    Increase activity slowly   Complete by: As directed       Allergies as of 09/24/2023       Reactions   Klonopin [clonazepam] Anaphylaxis   Lisinopril Anaphylaxis   Abilify [aripiprazole] Other (See Comments)   Insomnia, headaches, freq urination, anxiety    Cariprazine Swelling   Metformin And Related Diarrhea        Medication List     STOP taking these medications    escitalopram 10 MG tablet Commonly known as: LEXAPRO   escitalopram 20 MG tablet Commonly known as: LEXAPRO       TAKE these medications      Indication  ascorbic acid 500 MG tablet Commonly known as: VITAMIN C Take 500 mg by mouth daily.    buPROPion 300 MG 24 hr tablet Commonly known as: WELLBUTRIN XL Take 300 mg by mouth daily. What changed: Another medication with the same name was removed. Continue taking this medication, and follow the directions you see here.    calcium  carbonate 600 MG Tabs tablet  Commonly known as: OS-CAL Take 600 mg by mouth 2 (two) times daily with a meal.    carvedilol 3.125 MG tablet Commonly known as: COREG TAKE 1 TABLET (3.125 MG TOTAL) BY MOUTH 2 (TWO) TIMES DAILY WITH A MEAL.    cholecalciferol 25 MCG (1000 UNIT) tablet Commonly known as: VITAMIN D3 Take 1,000 Units by mouth daily.    hydrochlorothiazide 25 MG tablet Commonly known as: HYDRODIURIL TAKE 1 TABLET (25 MG TOTAL) BY MOUTH DAILY.    lamoTRIgine 50 MG Tbdp Take 1 tablet by mouth daily. What changed: Another medication with the same name was removed. Continue taking this medication, and follow the directions you see here.    lamoTRIgine 25 MG tablet Commonly known as: LAMICTAL Take 25 mg by mouth daily. What changed: Another medication with the same name was removed. Continue taking this medication, and follow the directions you see here.    multivitamin with minerals Tabs tablet Take 1 tablet by mouth daily.    nicotine 21 mg/24hr patch Commonly known as: NICODERM CQ - dosed in mg/24 hours Place 1 patch (21 mg total) onto the skin daily. Start taking on: September 25, 2023    pantoprazole 40 MG tablet Commonly known as: PROTONIX Take 40 mg by mouth every morning.    QUEtiapine 200 MG tablet Commonly known as: SEROQUEL Take 200 mg by mouth at bedtime.    sucralfate 1 g tablet Commonly known as: CARAFATE 1 tablet on an empty stomach    valACYclovir 500 MG tablet Commonly known as: VALTREX Take 500 mg by mouth 2 (two) times daily.    Vitamin D (Ergocalciferol) 1.25 MG (50000 UNIT) Caps capsule Commonly known as: DRISDOL Take 1 capsule (50,000 Units total) by mouth every 7 (seven) days.         Follow-up Information     [facility] [place]. Go on 11/13/2023.   Why: Your appointment is scheduled for 3:00 PM. Please remember to arrive at 2:45 PM for new patient papework. Please remember to bring your insurance  card Contact information: [address] [place], [place], [place] [address]  [phone]                Follow-up recommendations:  Activity:  As tolearted    Signed: Verner Chol, MD 09/24/2023, 9:53 AM

## 2023-09-24 NOTE — BHH Suicide Risk Assessment (Signed)
 [facility] Discharge Suicide Risk Assessment   Principal Problem: Bipolar affective disorder [facility]) Discharge Diagnoses: Principal Problem:   Bipolar affective disorder (HCC)   Total Time spent with patient: 30 minutes  Musculoskeletal: Strength & Muscle Tone: within normal limits Gait & Station: normal Patient leans: N/A  Psychiatric Specialty Exam  Presentation  General Appearance:  Appropriate for Environment; Casual  Eye Contact: Fair  Speech: Clear and Coherent  Speech Volume: Normal  Handedness: Right   Mood and Affect  Mood: Euthymic  Duration of Depression Symptoms: No data recorded Affect: Appropriate   Thought Process  Thought Processes: Coherent  Descriptions of Associations:Intact  Orientation:Full (Time, Place and Person)  Thought Content:Logical  History of Schizophrenia/Schizoaffective disorder:No data recorded Duration of Psychotic Symptoms:No data recorded Hallucinations:Hallucinations: None  Ideas of Reference:None  Suicidal Thoughts:Suicidal Thoughts: No  Homicidal Thoughts:Homicidal Thoughts: No   Sensorium  Memory: Immediate Fair; Recent Fair; Remote Fair  Judgment: Fair  Insight: Fair   Art therapist  Concentration: Fair  Attention Span: Fair  Recall: [employer] of Knowledge: Fair  Language: Fair   Psychomotor Activity  Psychomotor Activity: Psychomotor Activity: Normal   Assets  Assets: Communication Skills; Desire for Improvement; Physical Health   Sleep  Sleep: Sleep: Fair   Physical Exam: Physical Exam ROS Blood pressure (!) 129/90, pulse 71, temperature (!) 97.2 F (36.2 C), resp. rate 14, height 5\' 6"  (1.676 m), weight 100.7 kg, SpO2 100%. Body mass index is 35.83 kg/m.  Mental Status Per Nursing Assessment::   On Admission:     Demographic Factors:  Low socioeconomic status  Loss Factors: Decrease in vocational status  Historical Factors: NA  Risk Reduction Factors:    Sense of responsibility to family, Religious beliefs about death, Living with another person, especially a relative, Positive social support, Positive therapeutic relationship, and Positive coping skills or problem solving skills  Continued Clinical Symptoms:  Previous Psychiatric Diagnoses and Treatments  Cognitive Features That Contribute To Risk:  None    Suicide Risk:  Minimal: No identifiable suicidal ideation.  Patients presenting with no risk factors but with morbid ruminations; may be classified as minimal risk based on the severity of the depressive symptoms   Follow-up Information     [facility]. Go on 11/13/2023.   Why: Your appointment is scheduled for 3:00 PM. Please remember to arrive at 2:45 PM for new patient papework. Please remember to bring your insurance card Contact information: [address] [place], [place], [place] [address]  [phone]                Plan Of Care/Follow-up recommendations:  Activity:  As tolerated  Verner Chol, MD 09/24/2023, 11:38 AM

## 2023-09-24 NOTE — Progress Notes (Signed)
   09/24/23 1011  Psych Admission Type (Psych Patients Only)  Admission Status Voluntary  Psychosocial Assessment  Patient Complaints None  Eye Contact Fair  Facial Expression Flat  Affect Flat  Speech Logical/coherent  Interaction Assertive  Motor Activity Slow  Appearance/Hygiene In scrubs  Behavior Characteristics Cooperative  Mood Pleasant  Thought Process  Coherency WDL  Content WDL  Delusions None reported or observed  Perception WDL  Hallucination None reported or observed  Judgment WDL  Confusion None  Danger to Self  Current suicidal ideation? Denies  Danger to Others  Danger to Others None reported or observed   Discharged at this time to home in taxi. All discharge instructions read and given to patient with acknowledgment.

## 2023-09-24 NOTE — BHH Suicide Risk Assessment (Signed)
 [facility] INPATIENT:  Family/Significant Other Suicide Prevention Education  Suicide Prevention Education:  Education Completed; Greig Castilla, roommate, [phone] ,  (name of family member/significant other) has been identified by the patient as the family member/significant other with whom the patient will be residing, and identified as the person(s) who will aid the patient in the event of a mental health crisis (suicidal ideations/suicide attempt).  With written consent from the patient, the family member/significant other has been provided the following suicide prevention education, prior to the and/or following the discharge of the patient.  The suicide prevention education provided includes the following: Suicide risk factors Suicide prevention and interventions National Suicide Hotline telephone number [facility] assessment telephone number [facility] Emergency Assistance 911 [facility] and/or Residential Mobile Crisis Unit telephone number  Request made of family/significant other to: Remove weapons (e.g., guns, rifles, knives), all items previously/currently identified as safety concern.   Remove drugs/medications (over-the-counter, prescriptions, illicit drugs), all items previously/currently identified as a safety concern.  The family member/significant other verbalizes understanding of the suicide prevention education information provided.  The family member/significant other agrees to remove the items of safety concern listed above.  Elza Rafter 09/24/2023, 8:52 AM

## 2023-09-24 NOTE — Progress Notes (Signed)
  [facility] Adult Case Management Discharge Plan :  Will you be returning to the same living situation after discharge:  Yes,  pt will return home  At discharge, do you have transportation home?: Yes,  CSW will assist with transportation  Do you have the ability to pay for your medications: Yes,  [employer] MEDICARE / HUMANA MEDICARE HMO  Release of information consent forms completed and in the chart;  Patient's signature needed at discharge.  Patient to Follow up at:  Follow-up Information     [facility] [place]. Go on 11/13/2023.   Why: Your appointment is scheduled for 3:00 PM. Please remember to arrive at 2:45 PM for new patient papework. Please remember to bring your insurance card Contact information: [address] [place], [place], [place] [address]  [phone]                Next level of care provider has access to [facility] Link:no  Safety Planning and Suicide Prevention discussed: Yes,  Greig Castilla, roommate and CSW went over SPE brochure with ot      Has patient been referred to the Quitline?: Patient refused referral for treatment  Patient has been referred for addiction treatment: No known substance use disorder.  [address] LCSWA 09/24/2023, 8:50 AM

## 2023-09-26 ENCOUNTER — Other Ambulatory Visit: Payer: Self-pay | Admitting: Internal Medicine

## 2023-09-26 DIAGNOSIS — Z1231 Encounter for screening mammogram for malignant neoplasm of breast: Secondary | ICD-10-CM

## 2023-10-22 ENCOUNTER — Encounter: Payer: Self-pay | Admitting: Podiatry

## 2023-10-22 ENCOUNTER — Ambulatory Visit (INDEPENDENT_AMBULATORY_CARE_PROVIDER_SITE_OTHER): Admitting: Podiatry

## 2023-10-22 DIAGNOSIS — M79675 Pain in left toe(s): Secondary | ICD-10-CM

## 2023-10-22 DIAGNOSIS — B351 Tinea unguium: Secondary | ICD-10-CM

## 2023-10-22 DIAGNOSIS — M79674 Pain in right toe(s): Secondary | ICD-10-CM

## 2023-10-22 NOTE — Progress Notes (Addendum)
 This patient presents to the office with chief complaint of long thick painful nails.  Patient says the nails are painful walking and wearing shoes.  This patient is unable to self treat.  This patient is unable to trim her nails since she is unable to reach her nails.  She presents to the office for preventative foot care services.  Patient has CKD.  General Appearance  Alert, conversant and in no acute stress.  Vascular  Dorsalis pedis and posterior tibial  pulses are palpable  bilaterally.  Capillary return is within normal limits  bilaterally. Temperature is within normal limits  bilaterally.  Neurologic  Senn-Weinstein monofilament wire test within normal limits  bilaterally. Muscle power within normal limits bilaterally.  Nails Thick disfigured discolored nails with subungual debris  from hallux to fifth toes bilaterally. No evidence of bacterial infection or drainage bilaterally.  Orthopedic  No limitations of motion  feet .  No crepitus or effusions noted.  HAV  B/L.  Skin  normotropic skin with no porokeratosis noted bilaterally.  No signs of infections or ulcers noted.     Onychomycosis  Nails  B/L.  Pain in right toes  Pain in left toes  Debridement of nails both feet followed trimming the nails with dremel tool.    RTC prn   Ruffin Cotton DPM

## 2023-10-29 ENCOUNTER — Ambulatory Visit
Admission: RE | Admit: 2023-10-29 | Discharge: 2023-10-29 | Disposition: A | Discharge status: Home / Self Care | Source: Ambulatory Visit | Attending: Internal Medicine | Admitting: Internal Medicine

## 2023-10-29 DIAGNOSIS — Z1231 Encounter for screening mammogram for malignant neoplasm of breast: Secondary | ICD-10-CM | POA: Diagnosis not present

## 2023-10-30 DIAGNOSIS — Z87898 Personal history of other specified conditions: Secondary | ICD-10-CM | POA: Diagnosis not present

## 2023-10-30 DIAGNOSIS — F4310 Post-traumatic stress disorder, unspecified: Secondary | ICD-10-CM | POA: Diagnosis not present

## 2023-10-30 DIAGNOSIS — J302 Other seasonal allergic rhinitis: Secondary | ICD-10-CM | POA: Diagnosis not present

## 2023-10-30 DIAGNOSIS — F3341 Major depressive disorder, recurrent, in partial remission: Secondary | ICD-10-CM | POA: Diagnosis not present

## 2023-10-31 ENCOUNTER — Other Ambulatory Visit: Payer: Self-pay | Admitting: Internal Medicine

## 2023-10-31 DIAGNOSIS — F3131 Bipolar disorder, current episode depressed, mild: Secondary | ICD-10-CM | POA: Diagnosis not present

## 2023-10-31 DIAGNOSIS — R928 Other abnormal and inconclusive findings on diagnostic imaging of breast: Secondary | ICD-10-CM

## 2023-11-12 ENCOUNTER — Ambulatory Visit
Admission: RE | Admit: 2023-11-12 | Discharge: 2023-11-12 | Disposition: A | Discharge status: Home / Self Care | Source: Ambulatory Visit | Attending: Internal Medicine | Admitting: Internal Medicine

## 2023-11-12 ENCOUNTER — Other Ambulatory Visit: Payer: Self-pay | Admitting: Internal Medicine

## 2023-11-12 DIAGNOSIS — R920 Mammographic microcalcification found on diagnostic imaging of breast: Secondary | ICD-10-CM | POA: Diagnosis not present

## 2023-11-12 DIAGNOSIS — R928 Other abnormal and inconclusive findings on diagnostic imaging of breast: Secondary | ICD-10-CM

## 2023-11-12 DIAGNOSIS — R921 Mammographic calcification found on diagnostic imaging of breast: Secondary | ICD-10-CM

## 2023-11-13 ENCOUNTER — Encounter (HOSPITAL_COMMUNITY): Payer: Self-pay | Admitting: Psychiatry

## 2023-11-13 ENCOUNTER — Other Ambulatory Visit: Payer: Self-pay

## 2023-11-13 ENCOUNTER — Ambulatory Visit (HOSPITAL_BASED_OUTPATIENT_CLINIC_OR_DEPARTMENT_OTHER): Admitting: Psychiatry

## 2023-11-13 VITALS — BP 132/80 | HR 64 | Ht 66.0 in | Wt 223.0 lb

## 2023-11-13 DIAGNOSIS — F319 Bipolar disorder, unspecified: Secondary | ICD-10-CM | POA: Diagnosis not present

## 2023-11-13 MED ORDER — LAMOTRIGINE 200 MG PO TABS: ORAL_TABLET | ORAL | 3 refills | Status: AC

## 2023-11-13 MED ORDER — LAMOTRIGINE 200 MG PO TABS: 200.0000 mg | ORAL_TABLET | Freq: Every day | ORAL | 3 refills | Status: AC

## 2023-11-13 MED ORDER — BUPROPION HCL ER (XL) 300 MG PO TB24: 300.0000 mg | ORAL_TABLET | Freq: Every day | ORAL | 3 refills | Status: AC

## 2023-11-13 NOTE — Progress Notes (Signed)
 Psychiatric Initial Adult Assessment   Patient Identification: Rachel Woodard MRN:  [id number] Date of Evaluation:  11/13/2023 Referral Source: Self-referred Chief Complaint:  No chief complaint on file.  Visit Diagnosis: Bipolar disorder  History of Present Illness:   This patient is a 73 year old African-American female who is divorced mother and lives alone.  She recently was discharged from [facility] psychiatric hospital in March.  She carries a diagnosis supposedly bipolar disorder.  I think that diagnosis is in question.  She has a 20-year history of cocaine use up until 2021.  It is not clear if she has ever had a clear manic episode when she was not using cocaine.  When she was recently hospitalized she apparently had manic symptoms but according to her medications were not changed.  She takes Lamictal  375 mg and Wellbutrin  300 mg.  Today she denies any symptoms of mania.  She denies daily depression or problems with sleep or appetite and she has good energy.  She is no problems thinking or concentrating she enjoys reading music and has a dog.  She is not suicidal now and never has been.  She denies every use of alcohol  but admits to crack cocaine use for 20 years.  Worn off.  She denies any significant psychotic symptoms now or really ever.  She denies symptoms of generalized anxiety disorder or panic disorder or obsessive-compulsive disorder.  She does say that in fact she did have some hallucinations 5 years ago but at that time she was likely using cocaine.  She has been free of any psychotic symptoms since she has not been taking cocaine. Her medical history is significant for hypertension and diabetes Her psychiatric history is significant for 2 psychiatric hospitalizations the last 1 this past year.  She has had 3 admissions for rehab treatment for drug dependency.  Presently she feels very stable.  She has been getting her psychiatric medicines from her primary care doctor.  She  did see a psychiatric provider but they did not make any changes.  Associated Signs/Symptoms: Depression Symptoms:   (Hypo) Manic Symptoms:   Anxiety Symptoms:   Psychotic Symptoms:   PTSD Symptoms:   Past Psychiatric History: 2 psychiatric hospitalizations  Previous Psychotropic Medications: Yes   Substance Abuse History in the last 12 months:  Yes.    Consequences of Substance Abuse:   Past Medical History:  Past Medical History:  Diagnosis Date   Anemia 2001   after gastric bypass   Anxiety    Aortic atherosclerosis (HCC)    Arthritis    Back pain    Bilateral swelling of feet    Bipolar 1 disorder (HCC)    Breast cancer (HCC) 2013   Breast cancer    Radiation therapy   Calcified granuloma of lung    Cigarette nicotine  dependence    Cigarette nicotine  dependence in remission    Cocaine dependence in remission (HCC)    COPD (chronic obstructive pulmonary disease) (HCC)    Depression    Disequilibrium    Fibromyalgia    GERD (gastroesophageal reflux disease)    HSV infection    Hypertension    Hypertensive kidney disease with stage 3a chronic kidney disease (HCC)    Hypertensive kidney disease with stage 3b chronic kidney disease (HCC)    Osteoarthritis    Personal history of infectious disease    Personal history of radiation therapy 2013   Pneumonia    Pre-diabetes    PTSD (post-traumatic stress disorder)  Seasonal allergic rhinitis due to pollen    Sleep apnea    uses CPAP   SOB (shortness of breath)    Vitamin D  deficiency     Past Surgical History:  Procedure Laterality Date   ABDOMINAL HYSTERECTOMY  2018   BREAST LUMPECTOMY Right 2013   GASTRIC BYPASS  2001   TOTAL KNEE ARTHROPLASTY Left 12/22/2018   Procedure: LEFT TOTAL KNEE ARTHROPLASTY;  Surgeon: Wes Hamman, MD;  Location: [facility] OR;  Service: Orthopedics;  Laterality: Left;    Family Psychiatric History:   Family History:  Family History  Problem Relation Age of Onset   Leukemia  Mother    Prostate cancer Father        stage IV   Liver disease Brother    Diabetes Other    Cancer Other    Migraines Neg Hx    Headache Neg Hx    Breast cancer Neg Hx    BRCA 1/2 Neg Hx     Social History:   Social History   Socioeconomic History   Marital status: Widowed    Spouse name: Not on file   Number of children: 3   Years of education: 2 yrs college   Highest education level: Not on file  Occupational History   Occupation: Retired  Tobacco Use   Smoking status: Every Day    Current packs/day: 0.50    Average packs/day: 0.5 packs/day for 24.4 years (12.2 ttl pk-yrs)    Types: Cigarettes    Start date: 06/26/1999   Smokeless tobacco: Never   Tobacco comments:    1/2 ppd 03/13/22.   Vaping Use   Vaping status: Never Used  Substance and Sexual Activity   Alcohol  use: Not Currently    Comment: seldom   Drug use: No    Types: Cocaine    Comment: clean x 60 days on 11/28/15   Sexual activity: Yes  Other Topics Concern   Not on file  Social History Narrative   Widowed   3 children   Lives at home alone   disabled Comptroller   Retired      Caffeine:1-2 cups/day   Right handed   Social Drivers of Health   Financial Resource Strain: Not on file  Food Insecurity: No Food Insecurity (09/20/2023)   Hunger Vital Sign    Worried About Running Out of Food in the Last Year: Never true    Ran Out of Food in the Last Year: Never true  Transportation Needs: No Transportation Needs (09/20/2023)   PRAPARE - Administrator, Civil Service (Medical): No    Lack of Transportation (Non-Medical): No  Physical Activity: Inactive (12/29/2018)   Exercise Vital Sign    Days of Exercise per Week: 0 days    Minutes of Exercise per Session: 0 min  Stress: No Stress Concern Present (12/29/2018)   [employer] of Occupational Health - Occupational Stress Questionnaire    Feeling of Stress : Not at all  Social Connections: Unknown (09/20/2023)   Social  Connection and Isolation Panel [NHANES]    Frequency of Communication with Friends and Family: More than three times a week    Frequency of Social Gatherings with Friends and Family: Once a week    Attends Religious Services: Not on file    Active Member of Clubs or Organizations: Not on file    Attends Banker Meetings: Not on file    Marital Status: Not on file  Additional Social History:  Allergies:   Allergies  Allergen Reactions   Klonopin [Clonazepam] Anaphylaxis   Lisinopril Anaphylaxis   Abilify [Aripiprazole] Other (See Comments)    Insomnia, headaches, freq urination, anxiety    Cariprazine Swelling   Metformin  And Related Diarrhea    Metabolic Disorder Labs: Lab Results  Component Value Date   HGBA1C 6.0 (H) 06/06/2023   No results found for: "PROLACTIN" Lab Results  Component Value Date   CHOL 170 07/11/2023   TRIG 67 07/11/2023   HDL 64 07/11/2023   CHOLHDL 3.1 07/11/2017   VLDL 17 08/08/2016   LDLCALC 93 07/11/2023   LDLCALC 87 08/27/2022   Lab Results  Component Value Date   TSH 1.350 06/06/2023    Therapeutic Level Labs: No results found for: "LITHIUM" No results found for: "CBMZ" No results found for: "VALPROATE"  Current Medications: Current Outpatient Medications  Medication Sig Dispense Refill   lamoTRIgine  (LAMICTAL  XR) 50 MG 24 hour tablet Take 50 mg by mouth daily.     lamoTRIgine  (LAMICTAL ) 200 MG tablet 2 qhs 60 tablet 3   buPROPion  (WELLBUTRIN  XL) 300 MG 24 hr tablet Take 1 tablet (300 mg total) by mouth daily. 30 tablet 3   calcium  carbonate (OS-CAL) 600 MG TABS tablet Take 600 mg by mouth 2 (two) times daily with a meal.     carvedilol  (COREG ) 3.125 MG tablet TAKE 1 TABLET (3.125 MG TOTAL) BY MOUTH 2 (TWO) TIMES DAILY WITH A MEAL. 180 tablet 0   cholecalciferol  (VITAMIN D3) 25 MCG (1000 UNIT) tablet Take 1,000 Units by mouth daily.     hydrochlorothiazide  (HYDRODIURIL ) 25 MG tablet TAKE 1 TABLET (25 MG TOTAL) BY MOUTH  DAILY. 90 tablet 0   lamoTRIgine  (LAMICTAL ) 200 MG tablet Take 1 tablet (200 mg total) by mouth daily. 60 tablet 3   lamoTRIgine  50 MG TBDP Take 1 tablet by mouth daily.     Multiple Vitamin (MULTIVITAMIN WITH MINERALS) TABS tablet Take 1 tablet by mouth daily.     nicotine  (NICODERM CQ  - DOSED IN MG/24 HOURS) 21 mg/24hr patch Place 1 patch (21 mg total) onto the skin daily. 28 patch 0   pantoprazole  (PROTONIX ) 40 MG tablet Take 40 mg by mouth every morning.     QUEtiapine  (SEROQUEL ) 200 MG tablet Take 200 mg by mouth at bedtime.     sucralfate  (CARAFATE ) 1 g tablet 1 tablet on an empty stomach     valACYclovir  (VALTREX ) 500 MG tablet Take 500 mg by mouth 2 (two) times daily.     vitamin C  (ASCORBIC ACID ) 500 MG tablet Take 500 mg by mouth daily.     Vitamin D , Ergocalciferol , (DRISDOL ) 1.25 MG (50000 UNIT) CAPS capsule Take 1 capsule (50,000 Units total) by mouth every 7 (seven) days. 12 capsule 0   No current facility-administered medications for this visit.    Musculoskeletal: Strength & Muscle Tone: within normal limits Gait & Station: normal Patient leans: Right  Psychiatric Specialty Exam: Review of Systems  Blood pressure 132/80, pulse 64, height 5\' 6"  (1.676 m), weight 223 lb (101.2 kg).Body mass index is 35.99 kg/m.  General Appearance: Casual  Eye Contact:  Good  Speech:  NA  Volume:  Normal  Mood:  NA  Affect:  Appropriate  Thought Process:  Coherent  Orientation:  Full (Time, Place, and Person)  Thought Content:  WDL  Suicidal Thoughts:  No  Homicidal Thoughts:  No  Memory:  Negative  Judgement:  Fair  Insight:  Fair  Psychomotor Activity:  Negative  Concentration:    Recall:  Fair  Fund of Knowledge:Good  Language: Fair  Akathisia:  No  Handed:  Right  AIMS (if indicated):  not done  Assets:  Desire for Improvement  ADL's:  Intact  Cognition: WNL  Sleep:  NA   Screenings: GAD-7    Flowsheet Row Office Visit from 02/20/2021 in [facility]  [facility] at [facility] Video Visit from 12/15/2018 in [facility] [place] - A Dept Of [place]. [facility] Office Visit from 11/11/2018 in [facility] [place] - A Dept Of [place] [facility]. [facility] Office Visit from 04/24/2018 in [facility] [place] - A Dept Of [place]. [facility] Office Visit from 01/22/2018 in [facility] [place] - A Dept Of [place]. [facility]  Total GAD-7 Score 0 0 2 7 6       PHQ2-9    Flowsheet Row Office Visit from 03/14/2021 in [facility] at [facility] Visit from 02/20/2021 in [facility] for [facility] Healthcare at [facility] Patient Outreach Telephone from 12/29/2018 in [facility] Video Visit from 12/15/2018 in [place] [facility] [place] - A Dept Of [place]. [facility] Office Visit from 11/11/2018 in [facility] Comm Health [place] - A Dept Of [place] [facility]. [facility]  PHQ-2 Total Score 1 0 0 0 0  PHQ-9 Total Score 5 0 -- -- --      Flowsheet Row Admission (Discharged) from 09/20/2023 in [facility] ED from 09/19/2023 in [facility] Emergency Department at [facility]  C-SSRS RISK CATEGORY No Risk No Risk       Assessment and Plan:   At this time the patient's historical diagnosis is bipolar disorder.  She is on 375 mg of Lamictal  I will go ahead and adjust it up to 400 mg.  She will take 200 mg 2 at night.  She still takes Wellbutrin  300 mg.  This was the first part of a diagnostic evaluation.  I do suspect that her crack cocaine likely led to a lot of manic like symptoms which may have been misinterpreted.  At this time she shows no evidence of psychopathology in terms of symptoms of her particular condition.  For now she will continue taking Wellbutrin  and Lamictal .  We will see her back in about 2 months and reevaluate her.  Collaboration of Care:    Patient/Guardian was advised Release of Information must be obtained prior to any record release in order to collaborate their care with an outside provider. Patient/Guardian was advised if they have not already done so to contact the registration department to sign all necessary forms in order for us  to release information regarding their care.   Consent: Patient/Guardian gives verbal consent for treatment and assignment of benefits for services provided during this visit. Patient/Guardian expressed understanding and agreed to proceed.   Delorse Fey, MD 5/21/20253:54 PM

## 2023-11-23 DIAGNOSIS — N3942 Incontinence without sensory awareness: Secondary | ICD-10-CM | POA: Diagnosis not present

## 2023-11-28 DIAGNOSIS — R110 Nausea: Secondary | ICD-10-CM | POA: Diagnosis not present

## 2023-11-28 DIAGNOSIS — R42 Dizziness and giddiness: Secondary | ICD-10-CM | POA: Diagnosis not present

## 2023-11-28 DIAGNOSIS — R519 Headache, unspecified: Secondary | ICD-10-CM | POA: Diagnosis not present

## 2023-12-02 ENCOUNTER — Other Ambulatory Visit: Payer: Self-pay | Admitting: Internal Medicine

## 2023-12-02 DIAGNOSIS — R911 Solitary pulmonary nodule: Secondary | ICD-10-CM

## 2023-12-10 ENCOUNTER — Other Ambulatory Visit

## 2023-12-12 DIAGNOSIS — R42 Dizziness and giddiness: Secondary | ICD-10-CM | POA: Diagnosis not present

## 2023-12-18 ENCOUNTER — Ambulatory Visit
Admission: RE | Admit: 2023-12-18 | Discharge: 2023-12-18 | Disposition: A | Discharge status: Home / Self Care | Source: Ambulatory Visit | Attending: Internal Medicine | Admitting: Internal Medicine

## 2023-12-18 DIAGNOSIS — R911 Solitary pulmonary nodule: Secondary | ICD-10-CM | POA: Diagnosis not present

## 2023-12-18 DIAGNOSIS — K449 Diaphragmatic hernia without obstruction or gangrene: Secondary | ICD-10-CM | POA: Diagnosis not present

## 2024-01-08 DIAGNOSIS — N3942 Incontinence without sensory awareness: Secondary | ICD-10-CM | POA: Diagnosis not present

## 2024-01-15 ENCOUNTER — Ambulatory Visit (HOSPITAL_COMMUNITY): Admitting: Psychiatry

## 2024-01-24 ENCOUNTER — Other Ambulatory Visit (HOSPITAL_BASED_OUTPATIENT_CLINIC_OR_DEPARTMENT_OTHER): Payer: Self-pay | Admitting: Internal Medicine

## 2024-01-24 DIAGNOSIS — S0990XA Unspecified injury of head, initial encounter: Secondary | ICD-10-CM

## 2024-01-24 DIAGNOSIS — R42 Dizziness and giddiness: Secondary | ICD-10-CM | POA: Diagnosis not present

## 2024-01-24 DIAGNOSIS — R296 Repeated falls: Secondary | ICD-10-CM

## 2024-01-24 DIAGNOSIS — Z862 Personal history of diseases of the blood and blood-forming organs and certain disorders involving the immune mechanism: Secondary | ICD-10-CM | POA: Diagnosis not present

## 2024-01-30 ENCOUNTER — Ambulatory Visit (HOSPITAL_BASED_OUTPATIENT_CLINIC_OR_DEPARTMENT_OTHER): Admission: RE | Admit: 2024-01-30 | Source: Ambulatory Visit

## 2024-02-04 ENCOUNTER — Ambulatory Visit (HOSPITAL_BASED_OUTPATIENT_CLINIC_OR_DEPARTMENT_OTHER): Admission: RE | Admit: 2024-02-04 | Source: Ambulatory Visit

## 2024-02-04 ENCOUNTER — Encounter (HOSPITAL_BASED_OUTPATIENT_CLINIC_OR_DEPARTMENT_OTHER): Payer: Self-pay

## 2024-02-05 ENCOUNTER — Encounter (HOSPITAL_BASED_OUTPATIENT_CLINIC_OR_DEPARTMENT_OTHER): Payer: Self-pay | Admitting: Internal Medicine

## 2024-02-07 ENCOUNTER — Ambulatory Visit
Admission: RE | Admit: 2024-02-07 | Discharge: 2024-02-07 | Disposition: A | Discharge status: Home / Self Care | Source: Ambulatory Visit | Attending: Internal Medicine | Admitting: Internal Medicine

## 2024-02-07 DIAGNOSIS — S0990XA Unspecified injury of head, initial encounter: Secondary | ICD-10-CM

## 2024-02-07 DIAGNOSIS — R296 Repeated falls: Secondary | ICD-10-CM

## 2024-02-21 DIAGNOSIS — N3942 Incontinence without sensory awareness: Secondary | ICD-10-CM | POA: Diagnosis not present

## 2024-02-26 DIAGNOSIS — Z862 Personal history of diseases of the blood and blood-forming organs and certain disorders involving the immune mechanism: Secondary | ICD-10-CM | POA: Diagnosis not present

## 2024-03-06 ENCOUNTER — Encounter: Payer: Self-pay | Admitting: Neurology

## 2024-03-06 ENCOUNTER — Ambulatory Visit: Admitting: Neurology

## 2024-03-06 VITALS — BP 130/82 | HR 74 | Ht 66.0 in | Wt 218.6 lb

## 2024-03-06 DIAGNOSIS — R296 Repeated falls: Secondary | ICD-10-CM

## 2024-03-06 DIAGNOSIS — H5460 Unqualified visual loss, one eye, unspecified: Secondary | ICD-10-CM

## 2024-03-06 DIAGNOSIS — S0990XA Unspecified injury of head, initial encounter: Secondary | ICD-10-CM

## 2024-03-06 DIAGNOSIS — R2689 Other abnormalities of gait and mobility: Secondary | ICD-10-CM

## 2024-03-06 NOTE — Progress Notes (Unsigned)
 [facility]    Provider:  Dr Ines Requesting Provider: Cleotilde, [place]  E, PA Primary Care Provider:  Cleotilde, [place]  E, PA  CC:  frequent falls  HPI:  Rachel Woodard is a 73 y.o. female here as requested by Cleotilde, [place]  E, PA for frequent falls. has Unilateral primary osteoarthritis, left knee; Urge incontinence; Prediabetes; Fibromyalgia; Substance abuse in remission (HCC); Anxiety and depression; HX: breast cancer; Angioedema; DCIS (ductal carcinoma in situ) of breast; Depression; GERD (gastroesophageal reflux disease); Tobacco abuse; Genital herpes simplex; Vitamin D  deficiency; Morbid obesity (HCC); Essential hypertension; Renal cyst, right; Lung nodule, solitary; MDD (major depressive disorder), recurrent episode, mild (HCC); Autoimmune hepatitis (HCC); OSA (obstructive sleep apnea); H/O total knee replacement, left; Shortness of breath; Pain due to onychomycosis of toenails of both feet; Chronic kidney disease, stage 3a (HCC); Hx of breast cancer; Primary osteoarthritis of right knee; Body mass index 40.0-44.9, adult (HCC); History of Roux-en-Y gastric bypass; Bipolar 1 disorder, mixed, moderate (HCC); and Bipolar affective disorder (HCC) on their problem list.  She fell 4 times, she hit her head on the left, since she has headaches and has a flashing light in the left eye, she feels she is focusing with one eye feels everything is the left side and sees a a flashing light. She is seeing an eye doctor soon. The last fall was about a month ago, she was having balance issues and she is on a high dosage of iron  due to anemia. Since the iron  she has been good. She states she was fully conscious the whole time, the first time was months ago she was at the kitchen and she fell she was facing the sink she can't give me any details I just fell she landed on her buttocks and hip, the last 2 times she fell forward and she feels once she tripped, it came out of nowhere,  both falls in the same spot, the third time she fell of the couch, then she was outside with the dog and fell backwrds, doesn't remember any weakness, loss of consciousness, weakness, dizziness, lightheadedness, chest pain, nothing just a fall. It happens fast. When she fell forward she hit the left side of her forehead. She feels the imbalance was due to iron  deficiency and anemia and her dog pulls her. She feels very balanced now but given falls, headache, vision changes discuss mri brain. CT head with small meningioma likely incidental. She declines Physical Therapy at this time. If mri brain neg, follow up as needed. Exam non focal except mild imbalance in heel-to-toe discussed fall risks/prevention.  Reviewed notes, labs and imaging from outside physicians, which showed  09/19/2023 cbc unremarkable  09/19/2023: cbc unremarkable, cmp with elevated creatinine 1.43  IMPRESSION: reviewed CT head imaging and agree, small incidental meningioma. 1. No evidence of acute intracranial abnormality 2. Approximately 7 mm probable meningioma along the right parietal convexity without mass effect.  Review of Systems: Patient complains of symptoms per HPI as well as the following symptoms none. Pertinent negatives and positives per HPI. All others negative.   Social History   Socioeconomic History   Marital status: Widowed    Spouse name: Not on file   Number of children: 3   Years of education: 2 yrs college   Highest education level: Not on file  Occupational History   Occupation: Retired  Tobacco Use   Smoking status: Every Day    Current packs/day: 0.50    Average packs/day: 0.5 packs/day for 24.7 years (12.3  ttl pk-yrs)    Types: Cigarettes    Start date: 06/26/1999   Smokeless tobacco: Never   Tobacco comments:    1/2 ppd 03/13/22.   Vaping Use   Vaping status: Never Used  Substance and Sexual Activity   Alcohol  use: Not Currently    Comment: seldom   Drug use: No    Types: Cocaine     Comment: clean x 60 days on 11/28/15   Sexual activity: Yes  Other Topics Concern   Not on file  Social History Narrative   Widowed   3 children   Lives at home alone   disabled Comptroller   Retired      Caffeine:1-2 cups/day   Right handed   Social Drivers of Health   Financial Resource Strain: Not on file  Food Insecurity: No Food Insecurity (09/20/2023)   Hunger Vital Sign    Worried About Running Out of Food in the Last Year: Never true    Ran Out of Food in the Last Year: Never true  Transportation Needs: No Transportation Needs (09/20/2023)   PRAPARE - Administrator, Civil Service (Medical): No    Lack of Transportation (Non-Medical): No  Physical Activity: Inactive (12/29/2018)   Exercise Vital Sign    Days of Exercise per Week: 0 days    Minutes of Exercise per Session: 0 min  Stress: No Stress Concern Present (12/29/2018)   [employer] of Occupational Health - Occupational Stress Questionnaire    Feeling of Stress : Not at all  Social Connections: Unknown (09/20/2023)   Social Connection and Isolation Panel    Frequency of Communication with Friends and Family: More than three times a week    Frequency of Social Gatherings with Friends and Family: Once a week    Attends Religious Services: Not on Insurance claims handler of Clubs or Organizations: Not on file    Attends Banker Meetings: Not on file    Marital Status: Not on file  Intimate Partner Violence: Not At Risk (09/20/2023)   Humiliation, Afraid, Rape, and Kick questionnaire    Fear of Current or Ex-Partner: No    Emotionally Abused: No    Physically Abused: No    Sexually Abused: No    Family History  Problem Relation Age of Onset   Leukemia Mother    Prostate cancer Father        stage IV   Liver disease Brother    Diabetes Other    Cancer Other    Migraines Neg Hx    Headache Neg Hx    Breast cancer Neg Hx    BRCA 1/2 Neg Hx     Past Medical History:   Diagnosis Date   Anemia 2001   after gastric bypass   Anxiety    Aortic atherosclerosis (HCC)    Arthritis    Back pain    Bilateral swelling of feet    Bipolar 1 disorder (HCC)    Breast cancer (HCC) 2013   Breast cancer    Radiation therapy   Calcified granuloma of lung    Cigarette nicotine  dependence    Cigarette nicotine  dependence in remission    Cocaine dependence in remission (HCC)    COPD (chronic obstructive pulmonary disease) (HCC)    Depression    Disequilibrium    Fibromyalgia    GERD (gastroesophageal reflux disease)    HSV infection    Hypertension    Hypertensive kidney disease  with stage 3a chronic kidney disease (HCC)    Hypertensive kidney disease with stage 3b chronic kidney disease (HCC)    Osteoarthritis    Personal history of infectious disease    Personal history of radiation therapy 2013   Pneumonia    Pre-diabetes    PTSD (post-traumatic stress disorder)    Seasonal allergic rhinitis due to pollen    Sleep apnea    uses CPAP   SOB (shortness of breath)    Vitamin D  deficiency     Patient Active Problem List   Diagnosis Date Noted   Bipolar affective disorder (HCC) 09/20/2023   Bipolar 1 disorder, mixed, moderate (HCC) 09/19/2023   History of Roux-en-Y gastric bypass 11/14/2022   Primary osteoarthritis of right knee 11/07/2022   Body mass index 40.0-44.9, adult (HCC) 11/07/2022   Chronic kidney disease, stage 3a (HCC) 09/18/2022   Pain due to onychomycosis of toenails of both feet 01/20/2020   Shortness of breath 10/05/2019   H/O total knee replacement, left 12/22/2018   MDD (major depressive disorder), recurrent episode, mild (HCC) 10/29/2018   Autoimmune hepatitis (HCC) 10/29/2018   OSA (obstructive sleep apnea) 10/29/2018   Renal cyst, right 10/22/2018   Lung nodule, solitary 10/22/2018   Genital herpes simplex 10/29/2017   Vitamin D  deficiency 10/29/2017   Morbid obesity (HCC) 10/29/2017   Essential hypertension 10/29/2017    Prediabetes 02/14/2017   Fibromyalgia 02/14/2017   Substance abuse in remission (HCC) 02/14/2017   Anxiety and depression 02/14/2017   HX: breast cancer 02/14/2017   Urge incontinence 11/13/2016   Unilateral primary osteoarthritis, left knee 06/14/2016   Hx of breast cancer 04/02/2016   GERD (gastroesophageal reflux disease) 11/02/2015   Angioedema 10/24/2013   Tobacco abuse 10/24/2013   DCIS (ductal carcinoma in situ) of breast 04/07/2013   Depression 04/07/2013    Past Surgical History:  Procedure Laterality Date   ABDOMINAL HYSTERECTOMY  2018   BREAST LUMPECTOMY Right 2013   GASTRIC BYPASS  2001   TOTAL KNEE ARTHROPLASTY Left 12/22/2018   Procedure: LEFT TOTAL KNEE ARTHROPLASTY;  Surgeon: Jerri Kay HERO, MD;  Location: [facility] OR;  Service: Orthopedics;  Laterality: Left;    Current Outpatient Medications  Medication Sig Dispense Refill   buPROPion  (WELLBUTRIN  XL) 300 MG 24 hr tablet Take 1 tablet (300 mg total) by mouth daily. 30 tablet 3   calcium  carbonate (OS-CAL) 600 MG TABS tablet Take 600 mg by mouth 2 (two) times daily with a meal.     carvedilol  (COREG ) 3.125 MG tablet TAKE 1 TABLET (3.125 MG TOTAL) BY MOUTH 2 (TWO) TIMES DAILY WITH A MEAL. 180 tablet 0   cholecalciferol  (VITAMIN D3) 25 MCG (1000 UNIT) tablet Take 1,000 Units by mouth daily.     hydrochlorothiazide  (HYDRODIURIL ) 25 MG tablet TAKE 1 TABLET (25 MG TOTAL) BY MOUTH DAILY. 90 tablet 0   lamoTRIgine  (LAMICTAL ) 200 MG tablet Take 1 tablet (200 mg total) by mouth daily. 60 tablet 3   lamoTRIgine  50 MG TBDP Take 1 tablet by mouth daily.     Multiple Vitamin (MULTIVITAMIN WITH MINERALS) TABS tablet Take 1 tablet by mouth daily.     nicotine  (NICODERM CQ  - DOSED IN MG/24 HOURS) 21 mg/24hr patch Place 1 patch (21 mg total) onto the skin daily. 28 patch 0   pantoprazole  (PROTONIX ) 40 MG tablet Take 40 mg by mouth every morning.     sucralfate  (CARAFATE ) 1 g tablet 1 tablet on an empty stomach     valACYclovir  (VALTREX )  500 MG tablet Take 500 mg by mouth 2 (two) times daily.     vitamin C  (ASCORBIC ACID ) 500 MG tablet Take 500 mg by mouth daily.     Vitamin D , Ergocalciferol , (DRISDOL ) 1.25 MG (50000 UNIT) CAPS capsule Take 1 capsule (50,000 Units total) by mouth every 7 (seven) days. 12 capsule 0   lamoTRIgine  (LAMICTAL  XR) 50 MG 24 hour tablet Take 50 mg by mouth daily. (Patient not taking: Reported on 03/06/2024)     lamoTRIgine  (LAMICTAL ) 200 MG tablet 2 qhs (Patient not taking: Reported on 03/06/2024) 60 tablet 3   QUEtiapine  (SEROQUEL ) 200 MG tablet Take 200 mg by mouth at bedtime. (Patient not taking: Reported on 03/06/2024)     No current facility-administered medications for this visit.    Allergies as of 03/06/2024 - Review Complete 03/06/2024  Allergen Reaction Noted   Klonopin [clonazepam] Anaphylaxis 10/05/2019   Lisinopril Anaphylaxis 11/28/2015   Abilify [aripiprazole] Other (See Comments) 04/25/2022   Cariprazine Swelling 09/18/2022   Metformin  and related Diarrhea 12/15/2018    Vitals: BP 130/82 (BP Location: Right Arm, Patient Position: Sitting, Cuff Size: Normal)   Pulse 74   Ht 5' 6 (1.676 m)   Wt 218 lb 9.6 oz (99.2 kg)   BMI 35.28 kg/m  Last Weight:  Wt Readings from Last 1 Encounters:  03/06/24 218 lb 9.6 oz (99.2 kg)   Last Height:   Ht Readings from Last 1 Encounters:  03/06/24 5' 6 (1.676 m)     Physical exam: Exam: Gen: NAD, conversant, well nourised, obese, well groomed                     CV: RRR, no MRG. No Carotid Bruits. No peripheral edema, warm, nontender Eyes: Conjunctivae clear without exudates or hemorrhage  Neuro: Detailed Neurologic Exam  Speech:    Speech is normal; fluent and spontaneous with normal comprehension.  Cognition:    The patient is oriented to person, place, and time;     recent and remote memory intact;     language fluent;     normal attention, concentration,     fund of knowledge Cranial Nerves:    The pupils are equal,  round, and reactive to light. Pupils too small to visualize fundi, attempted. Visual fields are full to finger confrontation. Extraocular movements are intact. Trigeminal sensation is intact and the muscles of mastication are normal. The face is symmetric. The palate elevates in the midline. Hearing intact. Voice is normal. Shoulder shrug is normal. The tongue has normal motion without fasciculations.   Coordination: nml  Gait:    Sight imbalance on Heel-toe and tandem gait are normal.   Motor Observation:    No asymmetry, no atrophy, and no involuntary movements noted. Tone:    Normal muscle tone.    Posture:    Posture is normal. normal erect    Strength:    Strength is V/V in the upper and lower limbs.      Sensation: intact to LT     Reflex Exam:  DTR's:    Deep tendon reflexes in the upper and lower extremities are symmetrical bilaterally.   Toes:    The toes are downgoing bilaterally.   Clonus:    Clonus is absent.    Assessment/Plan:  73 year old female with a complex past medical history (including hypertension, CKD stage 3a, OSA, obesity, osteoarthritis with left TKR, fibromyalgia, autoimmune hepatitis, mood disorder, history of breast cancer, and tobacco use) referred for evaluation  of frequent falls.  Falls: Four episodes over several months. Mechanisms varied (forward, backward, off couch). No clear preceding symptoms such as syncope, presyncope, chest pain, palpitations, or focal weakness. She reports being fully conscious during all episodes. Some falls may have been mechanical (tripping, pulled by dog) and possibly contributed to by anemia/iron  deficiency.  Head trauma / visual symptoms: After a forward fall with head strike, she developed headaches and persistent flashing light in the left eye, described as monocular visual disturbance. Currently awaiting ophthalmology evaluation.  Balance: Mild imbalance on tandem gait noted on exam. She otherwise has a non-focal  neurologic exam. She feels more balanced since iron  supplementation.  Neuroimaging: Prior CT head showed a small meningioma, likely incidental.  Risk factors: Multiple comorbidities (CKD, OSA, obesity, orthopedic disease, mood disorder), polypharmacy, and anemia may all contribute to fall risk.  Plan:  Neuroimaging:  Order MRI brain with and without contrast to evaluate for intracranial pathology contributing to headaches, visual symptoms, or imbalance (to better characterize meningioma, rule out structural lesions, small strokes, demyelination).  Vision:  Proceed with scheduled ophthalmology visit to evaluate monocular flashing lights and blurred vision, to rule out retinal pathology vs migraine phenomena vs sequelae of trauma.  Reviewed fall risk and home safety strategies.  Physical Therapy for balance and gait training discussed but patient declines at this time. Will re-offer if symptoms recur.   If MRI is unremarkable, follow up as needed.  Patient to return promptly for recurrent falls, new neurologic symptoms, or worsening headaches/vision changes.  Orders Placed This Encounter  Procedures   MR BRAIN W WO CONTRAST   No orders of the defined types were placed in this encounter.   Cc: Cleotilde, [place]  E, PA,  Miller, [place]  E, PA  Onetha Epp, MD  [facility] [address] [place], [place] [address]  Phone [phone] Fax [phone]

## 2024-03-09 ENCOUNTER — Telehealth: Payer: Self-pay | Admitting: Neurology

## 2024-03-09 DIAGNOSIS — I10 Essential (primary) hypertension: Secondary | ICD-10-CM | POA: Diagnosis not present

## 2024-03-09 DIAGNOSIS — Z23 Encounter for immunization: Secondary | ICD-10-CM | POA: Diagnosis not present

## 2024-03-09 DIAGNOSIS — R296 Repeated falls: Secondary | ICD-10-CM | POA: Diagnosis not present

## 2024-03-09 NOTE — Telephone Encounter (Signed)
MRI order sent to [facility] [phone]

## 2024-04-29 ENCOUNTER — Encounter: Payer: Self-pay | Admitting: Podiatry

## 2024-04-29 ENCOUNTER — Ambulatory Visit (INDEPENDENT_AMBULATORY_CARE_PROVIDER_SITE_OTHER): Admitting: Podiatry

## 2024-04-29 VITALS — Ht 66.0 in | Wt 218.6 lb

## 2024-04-29 DIAGNOSIS — L600 Ingrowing nail: Secondary | ICD-10-CM

## 2024-04-29 NOTE — Patient Instructions (Signed)

## 2024-04-29 NOTE — Progress Notes (Signed)
 Chief Complaint  Patient presents with   Ingrown Toenail    Pt is here due to possible ingrown to the left great toenail.    Subjective: Patient presents today for evaluation of pain to the medial border left great toe. Patient is concerned for possible ingrown nail.  It is very sensitive to touch.  Patient presents today for further treatment and evaluation.  Past Medical History:  Diagnosis Date   Anemia 2001   after gastric bypass   Anxiety    Aortic atherosclerosis    Arthritis    Back pain    Bilateral swelling of feet    Bipolar 1 disorder (HCC)    Breast cancer (HCC) 2013   Breast cancer    Radiation therapy   Calcified granuloma of lung    Cigarette nicotine  dependence    Cigarette nicotine  dependence in remission    Cocaine dependence in remission (HCC)    COPD (chronic obstructive pulmonary disease) (HCC)    Depression    Disequilibrium    Fibromyalgia    GERD (gastroesophageal reflux disease)    HSV infection    Hypertension    Hypertensive kidney disease with stage 3a chronic kidney disease (HCC)    Hypertensive kidney disease with stage 3b chronic kidney disease (HCC)    Osteoarthritis    Personal history of infectious disease    Personal history of radiation therapy 2013   Pneumonia    Pre-diabetes    PTSD (post-traumatic stress disorder)    Seasonal allergic rhinitis due to pollen    Sleep apnea    uses CPAP   SOB (shortness of breath)    Vitamin D  deficiency     Past Surgical History:  Procedure Laterality Date   ABDOMINAL HYSTERECTOMY  2018   BREAST LUMPECTOMY Right 2013   GASTRIC BYPASS  2001   TOTAL KNEE ARTHROPLASTY Left 12/22/2018   Procedure: LEFT TOTAL KNEE ARTHROPLASTY;  Surgeon: Jerri Kay HERO, MD;  Location: [facility] OR;  Service: Orthopedics;  Laterality: Left;    Allergies  Allergen Reactions   Klonopin [Clonazepam] Anaphylaxis   Lisinopril Anaphylaxis   Abilify [Aripiprazole] Other (See Comments)    Insomnia, headaches, freq  urination, anxiety    Cariprazine Swelling   Metformin  And Related Diarrhea    Objective:  General: Well developed, nourished, in no acute distress, alert and oriented x3   Dermatology: Skin is warm, dry and supple bilateral. Medial border left great toe is tender with evidence of an ingrowing nail. Pain on palpation noted to the border of the nail fold. The remaining nails appear unremarkable at this time.   Vascular: DP and PT pulses palpable.  No clinical evidence of vascular compromise  Neruologic: Grossly intact via light touch bilateral.  Musculoskeletal: No pedal deformity noted  Assesement: #1 Paronychia with ingrowing nail medial border left great toe  Plan of Care:  -Patient evaluated.  -Discussed treatment alternatives and plan of care. Explained nail avulsion procedure and post procedure course to patient. -Patient opted for permanent partial nail avulsion of the ingrown portion of the nail.  -Prior to procedure, local anesthesia infiltration utilized using 3 ml of a 50:50 mixture of 2% plain lidocaine  and 0.5% plain marcaine  in a normal hallux block fashion and a betadine  prep performed.  -Partial permanent nail avulsion with chemical matrixectomy performed using 3x30sec applications of phenol followed by alcohol  flush.  -Light dressing applied.  Post care instructions provided -Return to clinic 3 weeks  Thresa EMERSON Sar, DPM [facility]  Dr. Thresa EMERSON Sar, DPM    [address] [place], [place] [address]                Office [phone]  Fax [phone]

## 2024-05-15 ENCOUNTER — Encounter

## 2024-06-09 ENCOUNTER — Ambulatory Visit
Admission: RE | Admit: 2024-06-09 | Discharge: 2024-06-09 | Disposition: A | Source: Ambulatory Visit | Attending: Internal Medicine | Admitting: Internal Medicine

## 2024-06-09 ENCOUNTER — Other Ambulatory Visit: Payer: Self-pay | Admitting: Internal Medicine

## 2024-06-09 DIAGNOSIS — R928 Other abnormal and inconclusive findings on diagnostic imaging of breast: Secondary | ICD-10-CM

## 2024-06-09 DIAGNOSIS — R921 Mammographic calcification found on diagnostic imaging of breast: Secondary | ICD-10-CM

## 2024-07-21 ENCOUNTER — Encounter (INDEPENDENT_AMBULATORY_CARE_PROVIDER_SITE_OTHER): Admitting: Ophthalmology

## 2024-08-03 ENCOUNTER — Encounter (INDEPENDENT_AMBULATORY_CARE_PROVIDER_SITE_OTHER): Admitting: Ophthalmology
